# Patient Record
Sex: Male | Born: 1990 | Race: Black or African American | Hispanic: No | Marital: Single | Smoking: Never smoker
Health system: Southern US, Community
[De-identification: ages and names within clinical notes are randomized; demographics above are authoritative.]

## PROBLEM LIST (undated history)

## (undated) ENCOUNTER — Emergency Department (HOSPITAL_BASED_OUTPATIENT_CLINIC_OR_DEPARTMENT_OTHER): Admission: EM | Payer: 59 | Source: Home / Self Care

## (undated) ENCOUNTER — Ambulatory Visit (HOSPITAL_COMMUNITY): Admission: EM | Payer: 59

## (undated) DIAGNOSIS — K118 Other diseases of salivary glands: Secondary | ICD-10-CM

## (undated) DIAGNOSIS — K219 Gastro-esophageal reflux disease without esophagitis: Secondary | ICD-10-CM

## (undated) DIAGNOSIS — R0981 Nasal congestion: Secondary | ICD-10-CM

## (undated) DIAGNOSIS — F29 Unspecified psychosis not due to a substance or known physiological condition: Secondary | ICD-10-CM

## (undated) DIAGNOSIS — T7840XA Allergy, unspecified, initial encounter: Secondary | ICD-10-CM

## (undated) DIAGNOSIS — R4182 Altered mental status, unspecified: Secondary | ICD-10-CM

## (undated) HISTORY — DX: Nasal congestion: R09.81

## (undated) HISTORY — DX: Allergy, unspecified, initial encounter: T78.40XA

## (undated) HISTORY — DX: Altered mental status, unspecified: R41.82

## (undated) HISTORY — DX: Gastro-esophageal reflux disease without esophagitis: K21.9

## (undated) HISTORY — DX: Other diseases of salivary glands: K11.8

## (undated) HISTORY — PX: WISDOM TOOTH EXTRACTION: SHX21

## (undated) HISTORY — DX: Unspecified psychosis not due to a substance or known physiological condition: F29

## (undated) HISTORY — PX: HIP ARTHROSCOPY: SUR88

---
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
## 2012-09-25 ENCOUNTER — Encounter (HOSPITAL_COMMUNITY): Payer: Self-pay

## 2012-09-25 ENCOUNTER — Emergency Department (INDEPENDENT_AMBULATORY_CARE_PROVIDER_SITE_OTHER)
Admission: EM | Admit: 2012-09-25 | Discharge: 2012-09-25 | Disposition: A | Discharge status: Home / Self Care | Payer: Managed Care, Other (non HMO) | Source: Home / Self Care

## 2012-09-25 DIAGNOSIS — S01512A Laceration without foreign body of oral cavity, initial encounter: Secondary | ICD-10-CM

## 2012-09-25 DIAGNOSIS — S01502A Unspecified open wound of oral cavity, initial encounter: Secondary | ICD-10-CM

## 2012-09-25 MED ORDER — AMOXICILLIN 500 MG PO CAPS: 500.0000 mg | ORAL_CAPSULE | Freq: Three times a day (TID) | ORAL | Status: DC

## 2012-09-25 MED ORDER — HYDROCODONE-ACETAMINOPHEN 5-325 MG PO TABS: 2.0000 | ORAL_TABLET | ORAL | Status: DC | PRN

## 2012-09-25 NOTE — ED Provider Notes (Signed)
History     CSN: [id number]  Arrival date & time 09/25/12  1611   None     Chief Complaint  Patient presents with  . Facial Pain    (Consider location/radiation/quality/duration/timing/severity/associated sxs/prior treatment) HPI Comments: Complains of pain in the right lower jaw particularly in the buccal mucosa adjacent to the wisdom tooth. This began approximately 2 days ago the   History reviewed. No pertinent past medical history.  History reviewed. No pertinent past surgical history.  History reviewed. No pertinent family history.  History  Substance Use Topics  . Smoking status: Not on file  . Smokeless tobacco: Not on file  . Alcohol Use: Not on file      Review of Systems  All other systems reviewed and are negative.    Allergies  Review of patient's allergies indicates no known allergies.  Home Medications   Current Outpatient Rx  Name  Route  Sig  Dispense  Refill  . AMOXICILLIN 500 MG PO CAPS   Oral   Take 1 capsule (500 mg total) by mouth 3 (three) times daily.   21 capsule   0   . HYDROCODONE-ACETAMINOPHEN 5-325 MG PO TABS   Oral   Take 2 tablets by mouth every 4 (four) hours as needed for pain.   15 tablet   0     There were no vitals taken for this visit.  Physical Exam  Nursing note and vitals reviewed. Constitutional: He is oriented to person, place, and time. He appears well-developed and well-nourished. No distress.  HENT:  Nose: Nose normal.  Mouth/Throat: Oropharynx is clear and moist. No oropharyngeal exudate.       The right lower third molar to is a rocking through the time and there is a sharp edge of the enamel. The sharp edge has been cutting into the adjacent vehicle because of. This produced a 2-3 mm superficial laceration. There is no sign of infection, abscess, erythema or drainage. There is light bleeding.  Eyes: Conjunctivae normal are normal.  Neck: Normal range of motion. Neck supple.  Pulmonary/Chest: Effort  normal.  Lymphadenopathy:    He has cervical adenopathy.  Neurological: He is alert and oriented to person, place, and time.  Skin: Skin is warm and dry.  Psychiatric: He has a normal mood and affect.    ED Course  Procedures (including critical care time)  Labs Reviewed - No data to display No results found.   1. Laceration of buccal mucosa       MDM  Plan to place calls between the bucca mucosa and the tube. He must call a dentist to see as soon as possible. I expect he will have to have some filing down of the tooth or habit extracted. Prophylaxis of infection we will prescribed amoxicillin 500 3 times a day for 7 days Ibuprofen 600 mg every 6-8 hours when necessary pain Norco 5 mg q. 4-6 hours when necessary pain         Hayden Rasmussen, NP 09/25/12 2233

## 2012-09-25 NOTE — ED Notes (Signed)
C/o sinus issues for several years, and a sore place in his mouth for past few days

## 2012-09-26 NOTE — ED Notes (Signed)
Pt called wanting to know if he could take ibuprofen in addition to the hydrocodone.  Spoke with Alba Cory and verified that as long as the pt had no allergy to ibuprofen then that would be fine.  Pt voiced understanding.

## 2012-09-27 NOTE — ED Notes (Signed)
Please call at [phone] for any issues

## 2012-09-27 NOTE — ED Notes (Signed)
Spoke w  Dawn in Clinical affairs at [facility] school of [facility]istry, who will accept our referral for this patient  [phone]; fax [phone]. Copies of chart to date to be sent via fax

## 2012-09-28 NOTE — ED Provider Notes (Signed)
Medical screening examination/treatment/procedure(s) were performed by resident physician or non-physician practitioner and as supervising physician I was immediately available for consultation/collaboration.   Satsuki Zillmer DOUGLAS MD.    Ardyce Heyer D Rahcel Shutes, MD 09/28/12 1931 

## 2013-01-06 ENCOUNTER — Other Ambulatory Visit: Payer: Self-pay | Admitting: Otolaryngology

## 2013-01-06 DIAGNOSIS — K118 Other diseases of salivary glands: Secondary | ICD-10-CM

## 2013-01-08 ENCOUNTER — Ambulatory Visit
Admission: RE | Admit: 2013-01-08 | Discharge: 2013-01-08 | Disposition: A | Discharge status: Home / Self Care | Payer: Managed Care, Other (non HMO) | Source: Ambulatory Visit | Attending: Otolaryngology | Admitting: Otolaryngology

## 2013-01-08 DIAGNOSIS — K118 Other diseases of salivary glands: Secondary | ICD-10-CM

## 2013-01-08 MED ORDER — IOHEXOL 300 MG/ML  SOLN
75.0000 mL | Freq: Once | INTRAMUSCULAR | Status: AC | PRN
  Administered 2013-01-08: 75 mL via INTRAVENOUS

## 2013-02-05 ENCOUNTER — Ambulatory Visit (INDEPENDENT_AMBULATORY_CARE_PROVIDER_SITE_OTHER): Payer: Managed Care, Other (non HMO) | Admitting: Neurology

## 2013-02-05 ENCOUNTER — Encounter: Payer: Self-pay | Admitting: Neurology

## 2013-02-05 VITALS — BP 130/65 | HR 61 | Ht 66.0 in | Wt 127.0 lb

## 2013-02-05 DIAGNOSIS — K118 Other diseases of salivary glands: Secondary | ICD-10-CM | POA: Insufficient documentation

## 2013-02-05 DIAGNOSIS — J3489 Other specified disorders of nose and nasal sinuses: Secondary | ICD-10-CM

## 2013-02-05 DIAGNOSIS — R0981 Nasal congestion: Secondary | ICD-10-CM | POA: Insufficient documentation

## 2013-02-05 MED ORDER — GABAPENTIN 100 MG PO CAPS: 100.0000 mg | ORAL_CAPSULE | Freq: Three times a day (TID) | ORAL | Status: DC

## 2013-02-05 NOTE — Progress Notes (Signed)
Nicholas Johnson is a 22 years old right-handed African American Male, referred by his ENT doctor Nicholas Johnson for evaluation of right jaw pain  His right jaw pain started around age 42, he was playing tuba in marching band, practice couple hours each day, during the process, he noticed right jaw  sharp burning sensation, which worsened while he platy instrument, improved if he rest he actually give up playing tuba because of his right jaw discomfort,  But he still has right jaw pressure sensation despite the fact he is no longer playing instrument, he felt pressure in a patient also affecting her right throat, right nose, irritation, he denied difficulty chewing, swallowing, no TMJ joints pain, complains of right ear popping,  Over the years he was seen by different physicians, but failed to identify etiology  He was evaluated by Dr. Jenne Johnson in January 01 2013, flexible laryngoscope examination was normal, including noninvasive was pharyngeal, vocal cord, CT of the neck was normal  Review of Systems  Out of a complete 14 system review, the patient complains of only the following symptoms, and all other reviewed systems are negative.   Constitutional:   N/A Cardiovascular:  N/A Ear/Nose/Throat:  N/A Skin: N/A Eyes: N/A Respiratory: N/A Gastroitestinal: N/A    Hematology/Lymphatic:  N/A Endocrine:  N/A Musculoskeletal:N/A Allergy/Immunology: N/A Neurological: N/A Psychiatric:    N/A  PHYSICAL EXAMINATOINS:  Generalized: In no acute distress  Neck: Supple, no carotid bruits   Cardiac: Regular rate rhythm  Pulmonary: Clear to auscultation bilaterally  Musculoskeletal: No deformity  Neurological examination  Mentation: Alert oriented to time, place, history taking, and causual conversation  Cranial nerve II-XII: Pupils were equal round reactive to light extraocular movements were full, visual field were full on confrontational test. facial sensation and strength were normal. Right face looks  mildly small compared to left hearing was intact to finger rubbing bilaterally. Uvula tongue midline.  head turning and shoulder shrug and were normal and symmetric.Tongue protrusion into cheek strength was normal.  Motor: normal tone, bulk and strength.  Sensory: Intact to fine touch, pinprick, preserved vibratory sensation, and proprioception at toes.  Coordination: Normal finger to nose, heel-to-shin bilaterally there was no truncal ataxia  Gait: Rising up from seated position without assistance, normal stance, without trunk ataxia, moderate stride, good arm swing, smooth turning, able to perform tiptoe, and heel walking without difficulty.   Romberg signs: Negative  Deep tendon reflexes: Brachioradialis 2/2, biceps 2/2, triceps 2/2, patellar 2/2, Achilles 2/2, plantar responses were flexor bilaterally.  Assessment and plan:  22 years old right-handed [place] American male, with right facial discomfort, essentially normal neurological examination, ENT evaluation, no TMJ joint pain,  1 unsure etiology, I advised him to continue to observe his symptoms, 2 Gabapentin 100mg  tid. 3. Return to clinic as needed.

## 2013-02-11 ENCOUNTER — Telehealth: Payer: Self-pay

## 2013-02-11 DIAGNOSIS — R6884 Jaw pain: Secondary | ICD-10-CM

## 2013-02-11 NOTE — Telephone Encounter (Signed)
Patient calling states is there any test he can have done ? Patient states RX not helping. Please advise

## 2013-02-12 ENCOUNTER — Telehealth: Payer: Self-pay | Admitting: *Deleted

## 2013-02-12 NOTE — Telephone Encounter (Signed)
Message copied by Salome Spotted on Wed Feb 12, 2013 10:27 AM ------      Message from: Eugenie Birks      Created: Tue Feb 11, 2013  4:37 PM       [phone] pt wants Dr. Terrace Arabia to call him he has a question ------

## 2013-02-12 NOTE — Telephone Encounter (Signed)
Message copied by Harlon Flor Zyier Dykema L on Wed Feb 12, 2013 10:28 AM ------      Message from: [facility], MONICA L      Created: Wed Feb 12, 2013  8:19 AM      Contact: Pt       Pt called and wants to speak with a nurse or Dr. Terrace Arabia, he has some questions regarding the cost for testing and concerns about medicine he was given which is gabapentin. He states he called and left a message yesterday but no one returned his call..            Thanks, ------

## 2013-02-12 NOTE — Telephone Encounter (Signed)
Called patient didn't get a answer lvm asking him to call us back.

## 2013-02-12 NOTE — Telephone Encounter (Signed)
I have called him, left message, I will order MRI of TMJ,  Increase gabapentin to 100mg  3 tabs tid.

## 2013-02-12 NOTE — Telephone Encounter (Signed)
Patient calling to see how much the mri would cost him and when will it be. I told him once its approved by his insurance someone will call him with cost and appointment.

## 2013-02-13 ENCOUNTER — Telehealth: Payer: Self-pay | Admitting: *Deleted

## 2013-02-13 NOTE — Telephone Encounter (Signed)
I have called him at [phone] and [phone], left message on both, image study is ordered.(MRI of TMJ)

## 2013-02-13 NOTE — Telephone Encounter (Signed)
Patient has questions regarding his problems,please call him back at [phone].Marland Kitchen

## 2013-02-18 ENCOUNTER — Telehealth: Payer: Self-pay | Admitting: Neurology

## 2013-02-18 NOTE — Telephone Encounter (Signed)
Dr. Terrace Arabia  It looks like this patient has not even had an MRI yet.  I have called him to be more specific on what he is needing.

## 2013-02-18 NOTE — Telephone Encounter (Signed)
Message copied by Elisha Headland on Tue Feb 18, 2013 12:01 PM ------      Message from: Wynonia Lawman      Created: Tue Feb 18, 2013  8:27 AM      Contact: Pt       Pt wants Dr. Terrace Arabia to call him back regarding MRI results.  [phone]            [facility]/Diane G ------

## 2013-02-18 NOTE — Telephone Encounter (Signed)
Pt had called yesterday and LMVM to call him after 1330 re: getting MRI.  He stated we needed to call [employer] for approval [id number], also needed CPT codes.   He was not improving with medications.

## 2013-02-18 NOTE — Telephone Encounter (Signed)
Dr. Terrace Arabia not sure if MRI results are back.  Patient would like a call.

## 2013-02-19 NOTE — Telephone Encounter (Signed)
I have called and left a detailed message for the patient in regards to his MRI.  I told him to address further questions about cost and insurance to [place] who handles all the details in regards to MRIs.

## 2013-02-20 ENCOUNTER — Telehealth: Payer: Self-pay

## 2013-02-20 NOTE — Telephone Encounter (Signed)
Per Renee Call patient about MRI.

## 2013-02-24 NOTE — Telephone Encounter (Addendum)
Patient calling wants Dr.Yan to call him

## 2013-02-24 NOTE — Telephone Encounter (Signed)
I have called, his MRI right TMJ was denied by his insurance.   I have suggested him to see a dentist for further evaluation, if needed, MRI can be ordered by his dentist.

## 2013-02-26 ENCOUNTER — Telehealth: Payer: Self-pay

## 2013-02-26 ENCOUNTER — Telehealth: Payer: Self-pay | Admitting: Neurology

## 2013-02-26 NOTE — Telephone Encounter (Signed)
Called and spoke to patient he went to the Dentist 02/25/2013 and they will try and do MRI.

## 2013-02-26 NOTE — Telephone Encounter (Signed)
Pt's dentist requests a phone consultation.

## 2013-02-27 ENCOUNTER — Ambulatory Visit (INDEPENDENT_AMBULATORY_CARE_PROVIDER_SITE_OTHER): Payer: Managed Care, Other (non HMO) | Admitting: Family Medicine

## 2013-02-27 ENCOUNTER — Encounter: Payer: Self-pay | Admitting: Family Medicine

## 2013-02-27 VITALS — BP 100/58 | HR 64 | Temp 99.0°F | Resp 14 | Wt 123.0 lb

## 2013-02-27 DIAGNOSIS — H6981 Other specified disorders of Eustachian tube, right ear: Secondary | ICD-10-CM

## 2013-02-27 DIAGNOSIS — H6980 Other specified disorders of Eustachian tube, unspecified ear: Secondary | ICD-10-CM

## 2013-02-27 DIAGNOSIS — K118 Other diseases of salivary glands: Secondary | ICD-10-CM

## 2013-02-27 MED ORDER — FLUTICASONE PROPIONATE 50 MCG/ACT NA SUSP: 2.0000 | Freq: Every day | NASAL | Status: DC

## 2013-02-27 MED ORDER — CETIRIZINE HCL 10 MG PO TABS: 10.0000 mg | ORAL_TABLET | Freq: Every day | ORAL | Status: DC

## 2013-02-27 NOTE — Progress Notes (Signed)
Subjective:    Patient ID: Nicholas Overall., male    DOB: [birth year], 21 y.o.   MRN: [id number]  HPI Patient presents with a one-year history of pressure-like sensation at the angle of his right mandible near the parotid gland. He also complains of a "obstruction" between his ear and his nose. He states he hears a cracking or popping sensation in his ear. He has some mild hearing loss in that ear.  Pain began a couple years ago. 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 It was a sharp burning pain which passed quickly but he continues to have an uncomfortable pressure-like sensation that area.  I reviewed the records from his ENT doctor, as well as a CT scan of his neck, and an office visit with his dentist. 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  The CT scan was clear of parotid gland mass, or salivary gland obstructions.  The sinuses are also clear .  He has also seen a neurologist who tried empiric therapy for trigeminal neuralgia with gabapentin which did not help.   Past Medical History  Diagnosis Date  . Parotid gland pain   . Nasal congestion    No current outpatient prescriptions on file prior to visit.   No current facility-administered medications on file prior to visit.   No Known Allergies History   Social History  . Marital Status: Married    Spouse Name: N/A    Number of Children: N/A  . Years of Education: N/A   Occupational History  . Not on file.   Social History Main Topics  . Smoking status: Never Smoker   . Smokeless tobacco: Not on file  . Alcohol Use: Not on file  . Drug Use: Not on file  . Sexually Active: Not on file   Other Topics Concern  . Not on file   Social History Narrative  . No narrative on file      Review of Systems  All other systems reviewed and are negative.       Objective:   Physical Exam  Constitutional: He is oriented to person, place, and time. He appears well-developed and well-nourished.   HENT:  Head: Normocephalic.  Right Ear: Tympanic membrane, external ear and ear canal normal. Tympanic membrane is not injected, not scarred, not perforated, not erythematous, not retracted and not bulging. No middle ear effusion.  Left Ear: Tympanic membrane, external ear and ear canal normal. Tympanic membrane is not injected, not scarred, not perforated, not erythematous, not retracted and not bulging.  Nose: Mucosal edema and rhinorrhea present. No septal deviation or nasal septal hematoma. Right sinus exhibits no maxillary sinus tenderness and no frontal sinus tenderness. Left sinus exhibits no maxillary sinus tenderness and no frontal sinus tenderness.  Mouth/Throat: Oropharynx is clear and moist.  Eyes: Conjunctivae and EOM are normal. Pupils are equal, round, and reactive to light.  Neck: Normal range of motion. Neck supple. No JVD present. No tracheal deviation present. No thyromegaly present.  Cardiovascular: Normal rate, regular rhythm and normal heart sounds.   No murmur heard. Pulmonary/Chest: Effort normal and breath sounds normal. No stridor. No respiratory distress. He has no wheezes. He has no rales. He exhibits no tenderness.  Lymphadenopathy:    He has no cervical adenopathy.  Neurological: He is alert and oriented to person, place, and time. No cranial nerve deficit. Coordination normal.  Skin: Skin is warm.  Psychiatric: He has a normal  mood and affect. His behavior is normal. Judgment and thought content normal.          Assessment & Plan:  Parotid gland pain  Chronic eustachian tube dysfunction, right  The patient has a very interesting history. The obstruction sensation he describes sounds a bit like eustachian tube dysfunction. I'm going to try the patient on Flonase 2 sprays each nostril inhaled daily and Zyrtec 10 mg by mouth daily. We will see if this alleviates some of eustachian tube dysfunction and clears up this obstruction or pressure-like sensation in  his right sinuses and nasal cavity. The pressure-like sensation that he feels near the angle of his right mandible could possibly be trigeminal neuralgia, parotid gland pain, postnasal drip. I will see if empiric treatment improves this as well.  He is to follow with me in one month.

## 2013-03-03 NOTE — Telephone Encounter (Signed)
Dr. Terrace Arabia Please call Dr. Dolores Hoose office today. She wants to speak to you about patient.

## 2013-03-03 NOTE — Telephone Encounter (Signed)
Annabelle Harman, please connect me to Dr. Levy Pupa office and page me.

## 2013-03-04 NOTE — Telephone Encounter (Signed)
Called Dr.Semone's office and she is with patient. Spoke to Cockrell Hill and she will have Dr.Semone to call back and ask for Dr.Yan to be paged.

## 2013-03-07 ENCOUNTER — Telehealth: Payer: Self-pay | Admitting: *Deleted

## 2013-03-07 NOTE — Telephone Encounter (Signed)
I have only seen the patient once. He is welcome to a copy of his office visit which he can show to his employer. At the present time I do not know the length of treatment.

## 2013-03-10 NOTE — Telephone Encounter (Signed)
Pt came in today wanted to know about his work note I told him the above message that we only seen him once and that he can have his ov notes and show to his employer he accepted that and said we will see what they say..printed out his office visit note.

## 2013-05-13 ENCOUNTER — Ambulatory Visit (INDEPENDENT_AMBULATORY_CARE_PROVIDER_SITE_OTHER): Payer: Managed Care, Other (non HMO) | Admitting: Family Medicine

## 2013-05-13 ENCOUNTER — Encounter: Payer: Self-pay | Admitting: Family Medicine

## 2013-05-13 VITALS — BP 120/60 | HR 72 | Temp 98.5°F | Resp 12 | Ht 67.0 in | Wt 120.0 lb

## 2013-05-13 DIAGNOSIS — H6980 Other specified disorders of Eustachian tube, unspecified ear: Secondary | ICD-10-CM

## 2013-05-13 DIAGNOSIS — H6981 Other specified disorders of Eustachian tube, right ear: Secondary | ICD-10-CM

## 2013-05-13 NOTE — Progress Notes (Signed)
Subjective:    Patient ID: Nicholas Overall., male    DOB: [birth year], 22 y.o.   MRN: [id number]  HPI 02/27/13 Patient presents with a one-year history of pressure-like sensation at the angle of his right mandible near the parotid gland. He also complains of a "obstruction" between his ear and his nose. He states he hears a cracking or popping sensation in his ear. He has some mild hearing loss in that ear.  Pain began a couple years ago. 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 It was a sharp burning pain which passed quickly but he continues to have an uncomfortable pressure-like sensation that area.  I reviewed the records from his ENT doctor, as well as a CT scan of his neck, and an office visit with his dentist. 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  The CT scan was clear of parotid gland mass, or salivary gland obstructions.  The sinuses are also clear .  He has also seen a neurologist who tried empiric therapy for trigeminal neuralgia with gabapentin which did not help.  At that time, my plan was: The patient has a very interesting history. The obstruction sensation he describes sounds a bit like eustachian tube dysfunction. I'm going to try the patient on Flonase 2 sprays each nostril inhaled daily and Zyrtec 10 mg by mouth daily. We will see if this alleviates some of eustachian tube dysfunction and clears up this obstruction or pressure-like sensation in his right sinuses and nasal cavity. The pressure-like sensation that he feels near the angle of his right mandible could possibly be trigeminal neuralgia, parotid gland pain, postnasal drip. I will see if empiric treatment improves this as well.  He is to follow with me in one month.  05/13/13 Patient is here for recheck. He saw no benefit from Flonase to Zyrtec. He no longer complains of pain in or around his parotid gland.  However he continues to complain of a pressure behind his right eardrum. He complains of  popping and cracking in his right ear whenever he coughs or sneezes. He complains of decreased hearing in his right ear. He denies any pain in his right ear. He denies any rhinorrhea or congestion. He denies any sneezing or sinus pain. Past Medical History  Diagnosis Date  . Parotid gland pain   . Nasal congestion    No current outpatient prescriptions on file prior to visit.   No current facility-administered medications on file prior to visit.   No Known Allergies History   Social History  . Marital Status: Married    Spouse Name: N/A    Number of Children: N/A  . Years of Education: N/A   Occupational History  . Not on file.   Social History Main Topics  . Smoking status: Never Smoker   . Smokeless tobacco: Not on file  . Alcohol Use: Not on file  . Drug Use: Not on file  . Sexually Active: Not on file   Other Topics Concern  . Not on file   Social History Narrative  . No narrative on file      Review of Systems  All other systems reviewed and are negative.       Objective:   Physical Exam  Constitutional: He is oriented to person, place, and time. He appears well-developed and well-nourished.  HENT:  Head: Normocephalic.  Right Ear: Tympanic membrane, external ear and ear canal normal. Tympanic membrane is  not injected, not scarred, not perforated, not erythematous, not retracted and not bulging. No middle ear effusion.  Left Ear: Tympanic membrane, external ear and ear canal normal. Tympanic membrane is not injected, not scarred, not perforated, not erythematous, not retracted and not bulging.  Nose: Mucosal edema and rhinorrhea present. No septal deviation or nasal septal hematoma. Right sinus exhibits no maxillary sinus tenderness and no frontal sinus tenderness. Left sinus exhibits no maxillary sinus tenderness and no frontal sinus tenderness.  Mouth/Throat: Oropharynx is clear and moist.  Eyes: Conjunctivae and EOM are normal. Pupils are equal, round, and  reactive to light.  Neck: Normal range of motion. Neck supple. No JVD present. No tracheal deviation present. No thyromegaly present.  Cardiovascular: Normal rate, regular rhythm and normal heart sounds.   No murmur heard. Pulmonary/Chest: Effort normal and breath sounds normal. No stridor. No respiratory distress. He has no wheezes. He has no rales. He exhibits no tenderness.  Lymphadenopathy:    He has no cervical adenopathy.  Neurological: He is alert and oriented to person, place, and time. No cranial nerve deficit. Coordination normal.  Skin: Skin is warm.  Psychiatric: He has a normal mood and affect. His behavior is normal. Judgment and thought content normal.          Assessment & Plan:  1. Eustachian tube dysfunction, right Patient would like to see a different ENT doctor for a second opinion.  I believe his symptoms are consistent with eustachian tube dysfunction and the patient may benefit from pressure equalization tubes. I would recommend tympanometry and possibly a CAT scan of the sinuses but I would defer this to ENT. I will consult Dr. Suszanne Conners for second opinion. - Ambulatory referral to ENT

## 2013-05-23 ENCOUNTER — Telehealth: Payer: Self-pay | Admitting: Family Medicine

## 2013-05-23 NOTE — Telephone Encounter (Signed)
I'd wait to see Teoh.

## 2013-05-26 NOTE — Telephone Encounter (Signed)
Pt aware.

## 2013-06-12 ENCOUNTER — Ambulatory Visit (INDEPENDENT_AMBULATORY_CARE_PROVIDER_SITE_OTHER): Payer: Managed Care, Other (non HMO) | Admitting: Otolaryngology

## 2013-06-12 DIAGNOSIS — H6980 Other specified disorders of Eustachian tube, unspecified ear: Secondary | ICD-10-CM

## 2013-06-20 ENCOUNTER — Ambulatory Visit: Payer: Managed Care, Other (non HMO) | Admitting: Family Medicine

## 2013-06-26 ENCOUNTER — Encounter: Payer: Self-pay | Admitting: Family Medicine

## 2013-06-26 ENCOUNTER — Ambulatory Visit (INDEPENDENT_AMBULATORY_CARE_PROVIDER_SITE_OTHER): Payer: Managed Care, Other (non HMO) | Admitting: Family Medicine

## 2013-06-26 VITALS — BP 100/60 | HR 68 | Temp 98.5°F | Resp 14 | Wt 120.0 lb

## 2013-06-26 DIAGNOSIS — F458 Other somatoform disorders: Secondary | ICD-10-CM

## 2013-06-26 DIAGNOSIS — R0989 Other specified symptoms and signs involving the circulatory and respiratory systems: Secondary | ICD-10-CM

## 2013-06-26 DIAGNOSIS — R198 Other specified symptoms and signs involving the digestive system and abdomen: Secondary | ICD-10-CM

## 2013-06-26 NOTE — Progress Notes (Signed)
Subjective:    Patient ID: Nicholas Johnson., male    DOB: [birth year], 22 y.o.   MRN: [id number]  HPI 02/27/13 Patient presents with a one-year history of pressure-like sensation at the angle of his right mandible near the parotid gland. He also complains of a "obstruction" between his ear and his nose. He states he hears a cracking or popping sensation in his ear. He has some mild hearing loss in that ear.  Pain began a couple years ago. 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 It was a sharp burning pain which passed quickly but he continues to have an uncomfortable pressure-like sensation that area.  I reviewed the records from his ENT doctor, as well as a CT scan of his neck, and an office visit with his dentist. 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  The CT scan was clear of parotid gland mass, or salivary gland obstructions.  The sinuses are also clear .  He has also seen a neurologist who tried empiric therapy for trigeminal neuralgia with gabapentin which did not help.  At that time, my plan was: The patient has a very interesting history. The obstruction sensation he describes sounds a bit like eustachian tube dysfunction. I'm going to try the patient on Flonase 2 sprays each nostril inhaled daily and Zyrtec 10 mg by mouth daily. We will see if this alleviates some of eustachian tube dysfunction and clears up this obstruction or pressure-like sensation in his right sinuses and nasal cavity. The pressure-like sensation that he feels near the angle of his right mandible could possibly be trigeminal neuralgia, parotid gland pain, postnasal drip. I will see if empiric treatment improves this as well.  He is to follow with me in one month.  05/13/13 Patient is here for recheck. He saw no benefit from Flonase to Zyrtec. He no longer complains of pain in or around his parotid gland.  However he continues to complain of a pressure behind his right eardrum. He complains of  popping and cracking in his right ear whenever he coughs or sneezes. He complains of decreased hearing in his right ear. He denies any pain in his right ear. He denies any rhinorrhea or congestion. He denies any sneezing or sinus pain. At that time, my plan was: 1. Eustachian tube dysfunction, right Patient would like to see a different ENT doctor for a second opinion.  I believe his symptoms are consistent with eustachian tube dysfunction and the patient may benefit from pressure equalization tubes. I would recommend tympanometry and possibly a CAT scan of the sinuses but I would defer this to ENT. I will consult Dr. Suszanne Conners for second opinion. - Ambulatory referral to ENT  06/26/13 Patient saw Dr. Suszanne Conners.  He recommended a prednisone Dosepak and Valsalva maneuvers for eustachian tube dysfunction. Unfortunately the patient has not experienced any improvement. Furthermore he continues to complain of a pressure like sensation in the back of his throat at the level of the angle of the mandible. It is deep to the right side of his throat. He had a CT scan of the head and neck which was negative. He's also had a laryngoscopy performed by ENT which was negative. The symptoms have been present for over 2 years with no significant sequela. Unfortunately they do not seem to be improving. Past Medical History  Diagnosis Date  . Parotid gland pain   . Nasal congestion    No current outpatient  prescriptions on file prior to visit.   No current facility-administered medications on file prior to visit.   No Known Allergies History   Social History  . Marital Status: Married    Spouse Name: N/A    Number of Children: N/A  . Years of Education: N/A   Occupational History  . Not on file.   Social History Main Topics  . Smoking status: Never Smoker   . Smokeless tobacco: Not on file  . Alcohol Use: Not on file  . Drug Use: Not on file  . Sexual Activity: Not on file   Other Topics Concern  . Not on file    Social History Narrative  . No narrative on file      Review of Systems  All other systems reviewed and are negative.       Objective:   Physical Exam  Constitutional: He is oriented to person, place, and time. He appears well-developed and well-nourished.  HENT:  Head: Normocephalic.  Right Ear: Tympanic membrane, external ear and ear canal normal. Tympanic membrane is not injected, not scarred, not perforated, not erythematous, not retracted and not bulging. No middle ear effusion.  Left Ear: Tympanic membrane, external ear and ear canal normal. Tympanic membrane is not injected, not scarred, not perforated, not erythematous, not retracted and not bulging.  Nose: Mucosal edema and rhinorrhea present. No septal deviation or nasal septal hematoma. Right sinus exhibits no maxillary sinus tenderness and no frontal sinus tenderness. Left sinus exhibits no maxillary sinus tenderness and no frontal sinus tenderness.  Mouth/Throat: Oropharynx is clear and moist.  Eyes: Conjunctivae and EOM are normal. Pupils are equal, round, and reactive to light.  Neck: Normal range of motion. Neck supple. No JVD present. No tracheal deviation present. No thyromegaly present.  Cardiovascular: Normal rate, regular rhythm and normal heart sounds.   No murmur heard. Pulmonary/Chest: Effort normal and breath sounds normal. No stridor. No respiratory distress. He has no wheezes. He has no rales. He exhibits no tenderness.  Lymphadenopathy:    He has no cervical adenopathy.  Neurological: He is alert and oriented to person, place, and time. No cranial nerve deficit. Coordination normal.  Skin: Skin is warm.  Psychiatric: He has a normal mood and affect. His behavior is normal. Judgment and thought content normal.          Assessment & Plan:   1. Globus sensation I recommended the patient followup with Dr. Suszanne Conners as planned to discuss further intervention for his eustachian tube dysfunction.  Unfortunately I do not know what else to do or to try to treat the globus sensation in his throat. 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 I do not feel that it is related to reflux or psychiatric in nature. Therefore I will not charge the patient a copay today. I apologized to the patient but I do not know anything else that can be tried. I do believe the problem is benign and not life-threatening.

## 2013-07-10 ENCOUNTER — Ambulatory Visit (INDEPENDENT_AMBULATORY_CARE_PROVIDER_SITE_OTHER): Payer: Managed Care, Other (non HMO) | Admitting: Otolaryngology

## 2013-07-10 DIAGNOSIS — R070 Pain in throat: Secondary | ICD-10-CM

## 2013-07-10 DIAGNOSIS — H6980 Other specified disorders of Eustachian tube, unspecified ear: Secondary | ICD-10-CM

## 2013-07-10 DIAGNOSIS — J310 Chronic rhinitis: Secondary | ICD-10-CM

## 2014-10-18 ENCOUNTER — Emergency Department (HOSPITAL_COMMUNITY)
Admission: EM | Admit: 2014-10-18 | Discharge: 2014-10-18 | Disposition: A | Discharge status: Home / Self Care | Payer: Managed Care, Other (non HMO) | Attending: Emergency Medicine | Admitting: Emergency Medicine

## 2014-10-18 ENCOUNTER — Encounter (HOSPITAL_COMMUNITY): Payer: Self-pay | Admitting: Emergency Medicine

## 2014-10-18 DIAGNOSIS — Z8719 Personal history of other diseases of the digestive system: Secondary | ICD-10-CM | POA: Insufficient documentation

## 2014-10-18 DIAGNOSIS — F99 Mental disorder, not otherwise specified: Secondary | ICD-10-CM | POA: Insufficient documentation

## 2014-10-18 DIAGNOSIS — Z008 Encounter for other general examination: Secondary | ICD-10-CM | POA: Diagnosis present

## 2014-10-18 NOTE — ED Notes (Signed)
Pt reports to ED with family, reporting that he has had increased anxiety "after a personal situation" a week ago, that has had him "confused" so that he will write down details of events and stay in his room due feeling that he will "forget something or not be right with the Lord." Pt noted to have religious preoccupation, poor eye contact and inappropriately smiling. Pt holding a piece of paper and phone where he is currently keeping his notes.

## 2014-10-18 NOTE — ED Provider Notes (Signed)
CSN: [id number]     Arrival date & time 10/18/14  1911 History   First MD Initiated Contact with Patient 10/18/14 1945     Chief Complaint  Patient presents with  . Medical Clearance     (Consider location/radiation/quality/duration/timing/severity/associated sxs/prior Treatment) HPI Nicholas Overalleter J Lauture Jr. is a 23 y.o. male with no medical problems presents to emergency department with his family who were concerned about patient's mental health. Patient states that he is fine, he states that "I had a situation that happened and I have been just dealing with the situation." Patient's family stated the patient locked himself in the room for over 24 hours, he refused to get out of the room, he has been scribbling different things on multiple pieces of paper, he has been very religious preoccupied, sometimes forgetting things, sometimes "talking out of his head." Patient's family is very concerned about his mental status. Patient denies. He denies any confusion, no memory issues, no hallucinations, no delusions. He states "I'm just dealing with some spiritual things right now something that we will let her stand." Patient sitting in the ER, scribbling on a piece of paper, unable to tell what he is scribbling. Patient does not have history of the same. He states that he is just going through a lot. He denies SI or HI. No history of depression. He states he is seeing "spiritual counselor" who is helping him deal with his issues. He refuses to tell us his name. He also refuses to go in detail about his "situation."  Past Medical History  Diagnosis Date  . Parotid gland pain   . Nasal congestion    History reviewed. No pertinent past surgical history. History reviewed. No pertinent family history. History  Substance Use Topics  . Smoking status: Never Smoker   . Smokeless tobacco: Never Used  . Alcohol Use: No    Review of Systems  Constitutional: Negative for fever and chills.  Respiratory:  Negative for cough, chest tightness and shortness of breath.   Cardiovascular: Negative for chest pain, palpitations and leg swelling.  Gastrointestinal: Negative for nausea, vomiting, abdominal pain, diarrhea and abdominal distention.  Genitourinary: Negative for dysuria, urgency, frequency and hematuria.  Musculoskeletal: Negative for myalgias, arthralgias, neck pain and neck stiffness.  Skin: Negative for rash.  Allergic/Immunologic: Negative for immunocompromised state.  Neurological: Negative for dizziness, weakness, light-headedness, numbness and headaches.  Psychiatric/Behavioral: Positive for confusion and decreased concentration. The patient is nervous/anxious.   All other systems reviewed and are negative.     Allergies  Review of patient's allergies indicates no known allergies.  Home Medications   Prior to Admission medications   Not on File   BP 118/75 mmHg  Pulse 63  Temp(Src) 97.6 F (36.4 C) (Oral)  Resp 16  Ht 5\' 7"  (1.702 m)  Wt 125 lb (56.7 kg)  BMI 19.57 kg/m2  SpO2 100% Physical Exam  Constitutional: He is oriented to person, place, and time. He appears well-developed and well-nourished. No distress.  HENT:  Head: Normocephalic and atraumatic.  Eyes: Conjunctivae and EOM are normal. Pupils are equal, round, and reactive to light.  Neck: Normal range of motion. Neck supple.  No meningismus  Cardiovascular: Normal rate, regular rhythm and normal heart sounds.   Pulmonary/Chest: Effort normal and breath sounds normal. No respiratory distress.  Musculoskeletal: He exhibits no edema.  Neurological: He is alert and oriented to person, place, and time. No cranial nerve deficit. He exhibits normal muscle tone. Coordination normal.  5/5 and  equal upper and lower extremity strength bilaterally. Equal grip strength bilaterally. Normal finger to nose and heel to shin. No pronator drift.   Skin: Skin is warm and dry. No rash noted.  Psychiatric: He has a normal  mood and affect. His speech is rapid and/or pressured. He is hyperactive.  Nursing note and vitals reviewed.   ED Course  Procedures (including critical care time) Labs Review Labs Reviewed - No data to display  Imaging Review No results found.   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 There is a concern for possible early psychosis and schizophrenia. Patient apparently liking himself in the room, refusing to communicate, scribbling on multiple pieces of paper, very preoccupied with religion and God. On my examination, although hyperactive, pressured speech, smiling inappropriately, he is alert and oriented 3. No SI or HI. Patient is well aware of situation that's happening right now, and he is able to reason at this time about future treatment plan. No indication of possible self-harm or harm to anyone else. I did consult TMs, who assessed the patient. They discussed patient with psychiatrist who recommended observation, however patient is refusing to stay. This time no indication for IVCing him. Patient was given resources for outpatient follow-up. He'll be discharged home with his family who agreed to bring him back if his condition worsens. Patient also understands that he needs to seek help, and return if he is having any issues.  Filed Vitals:   10/18/14 1930  BP: 118/75  Pulse: 63  Temp: 97.6 F (36.4 C)  TempSrc: Oral  Resp: 16  Height: 5\' 7"  (1.702 m)  Weight: 125 lb (56.7 kg)  SpO2: 100%        Lottie Musselatyana A Tamia Dial, PA-C 10/18/14 2300  Raeford RazorStephen Kohut, MD 10/19/14 1455

## 2014-10-18 NOTE — Discharge Instructions (Signed)
Please follow up with resources provided. Return if worsening symptoms.   Schizophrenia Schizophrenia is a mental illness. It may cause disturbed or disorganized thinking, speech, or behavior. People with schizophrenia have problems functioning in one or more areas of life: work, school, home, or relationships. People with schizophrenia are at increased risk for suicide, certain chronic physical illnesses, and unhealthy behaviors, such as smoking and drug use. People who have family members with schizophrenia are at higher risk of developing the illness. Schizophrenia affects men and women equally but usually appears at an earlier age (teenage or early adult years) in men.  SYMPTOMS The earliest symptoms are often subtle (prodrome) and may go unnoticed until the illness becomes more severe (first-break psychosis). Symptoms of schizophrenia may be continuous or may come and go in severity. Episodes often are triggered by major life events, such as family stress, college, [employer] service, marriage, pregnancy or child birth, divorce, or loss of a loved one. People with schizophrenia may see, hear, or feel things that do not exist (hallucinations). They may have false beliefs in spite of obvious proof to the contrary (delusions). Sometimes speech is incoherent or behavior is odd or withdrawn.  DIAGNOSIS Schizophrenia is diagnosed through an assessment by your caregiver. Your caregiver will ask questions about your thoughts, behavior, mood, and ability to function in daily life. Your caregiver may ask questions about your medical history and use of alcohol or drugs, including prescription medication. Your caregiver may also order blood tests and imaging exams. Certain medical conditions and substances can cause symptoms that resemble schizophrenia. Your caregiver may refer you to a mental health specialist for evaluation. There are three major criterion for a diagnosis of schizophrenia:  Two or more of the  following five symptoms are present for a month or longer:  Delusions. Often the delusions are that you are being attacked, harassed, cheated, persecuted or conspired against (persecutory delusions).  Hallucinations.   Disorganized speech that does not make sense to others.  Grossly disorganized (confused or unfocused) behavior or extremely overactive or underactive motor activity (catatonia).  Negative symptoms such as bland or blunted emotions (flat affect), loss of will power (avolition), and withdrawal from social contacts (social isolation).  Level of functioning in one or more major areas of life (work, school, relationships, or self-care) is markedly below the level of functioning before the onset of illness.   There are continuous signs of illness (either mild symptoms or decreased level of functioning) for at least 6 months or longer. TREATMENT  Schizophrenia is a long-term illness. It is best controlled with continuous treatment rather than treatment only when symptoms occur. The following treatments are used to manage schizophrenia:  Medication--Medication is the most effective and important form of treatment for schizophrenia. Antipsychotic medications are usually prescribed to help manage schizophrenia. Other types of medication may be added to relieve any symptoms that may occur despite the use of antipsychotic medications.  Counseling or talk therapy--Individual, group, or family counseling may be helpful in providing education, support, and guidance. Many people with schizophrenia also benefit from social skills and job skills (vocational) training. A combination of medication and counseling is best for managing the disorder over time. A procedure in which electricity is applied to the brain through the scalp (electroconvulsive therapy) may be used to treat catatonic schizophrenia or schizophrenia in people who cannot take or do not respond to medication and  counseling. Document Released: 10/06/2000 Document Revised: 06/11/2013 Document Reviewed: 01/01/2013 [facility] Patient Information 2015 [employer], [place].  This information is not intended to replace advice given to you by your health care provider. Make sure you discuss any questions you have with your health care provider.  Paranoia Paranoia is a distrust of others that is not based on a real reason for distrust. This may reach delusional levels. This means the paranoid person feels the world is against them when there is no reason to make them feel that way. People with paranoia feel as though people around them are "out to get them".  SIMILAR MENTAL ILNESSES  Depression is a feeling as though you are down all the time. It is normal in some situations where you have just lost a loved one. It is abnormal if you are having feelings of paranoia with it.  Dementia is a physical problem with the brain in which the brain no longer works properly. There are problems with daily activities of living. Alzheimer's disease is one example of this. Dementia is also caused by old age changes in the brain which come with the death of brain cells and small strokes.  Paranoidschizophrenia. People with paranoid schizophrenia and persecutory delusional disorder have delusions in which they feel people around them are plotting against them. Persecutory delusions in paranoid schizophrenia are bizarre, sometimes grandiose, and often accompanied by auditory hallucinations. This means the person is hearing voices that are not there.  Delusionaldisorder (persecutory type). Delusions experienced by individuals with delusional disorder are more believable than those experienced by paranoid schizophrenics; they are not bizarre, though still unjustified. Individuals with delusional disorder may seem offbeat or quirky rather than mentally ill, and therefore, may never seek treatment. All of these problems usually do not allow these  people to interact socially in an acceptable manner. CAUSES The cause of paranoia is often not known. It is common in people with extended abuse of:  Cocaine.  Amphetamine.  Marijuana.  Alcohol. Sometimes there is an inherited tendency. It may be associated with stress or changes in brain chemistry. DIAGNOSIS  When paranoia is present, your caregiver may:  Refer you to a specialist.  Do a physical exam.  Perform other tests on you to make sure there are not other problems causing the paranoia including:  Physical problems.  Mental problems.  Chemical problems (other than drugs). Testing may be done to determine if there is a psychiatric disability present that can be treated with medicine. TREATMENT   Paranoia that is a symptom of a psychiatric problem should be treated by professionals.  Medicines are available which can help this disorder. Antipsychotic medicine may be prescribed by your caregiver.  Sometimes psychotherapy may be useful.  Conditions such as depression or drug abuse are treated individually. If the paranoia is caused by drug abuse, a treatment facility may be helpful. Depression may be helped by antidepressants. PROGNOSIS   Paranoid people are difficult to treat because of their belief that everyone is out to get them or harm them. Because of this mistrust, they often must be talked into entering treatment by a trusted family member or friend. They may not want to take medicine as they may see this as an attempt to poison them.  Gradual gains in the trust of a therapist or caregiver helps in a successful treatment plan.  Some people with PPD or persecutory delusional disorder function in society without treatment in limited fashion. Document Released: 10/12/2003 Document Revised: 01/01/2012 Document Reviewed: 06/16/2008 [facility] Patient Information 2015 [place], [place]. This information is not intended to replace advice given to  you by your health care  provider. Make sure you discuss any questions you have with your health care provider.

## 2014-10-18 NOTE — BH Assessment (Signed)
Tele Assessment Note   Nicholas Johnson. is a 23 y.o. male who voluntarily presents to [facility] with increased anxiety, accompanied by his mother and aunt.  Pt denies SI/HI/AVH.  Pt brought in by his family, they are concerned with his recent  Behavior: per mother/aunt, pt has been his room for the last 48 hrs and would come out and and has not eaten. Pt.'s family says that he repeatedly states he needs to do something or say something "to make it right". Pt told this Probation officer that he has been praying for the last 24-48 hrs about a spiritual matter in regards to relationships with women.  Mother and aunt state that pt is a spiritual person--attending church, reading the bible and prayer/meditation but for the last 2 days his routine has become excessive and he's been writing notes so he "doesn't forget things". 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 This Probation officer spoke with Dr. Parke Poisson, although no criteria is met for inpt admission, Dr. Parke Poisson suggested that pt remain in the ED overnight for observation and see the psychiatrist in the morning.  Pt declined service and will d/c'd home with mother/aunt with referrals.  Axis I: Mood Disorder NOS Axis II: Deferred Axis III:  Past Medical History  Diagnosis Date  . Parotid gland pain   . Nasal congestion    Axis IV: other psychosocial or environmental problems and problems related to social environment Axis V: 51-60 moderate symptoms  Past Medical History:  Past Medical History  Diagnosis Date  . Parotid gland pain   . Nasal congestion     History reviewed. No pertinent past surgical history.  Family History: History reviewed. No pertinent family history.  Social History:  reports that he has never smoked. He has never used smokeless tobacco. He reports that he does not drink alcohol or use illicit drugs.  Additional Social History:  Alcohol / Drug Use Pain Medications: None  Prescriptions: None  Over the  Counter: None  History of alcohol / drug use?: No history of alcohol / drug abuse Longest period of sobriety (when/how long): Denies  CIWA: CIWA-Ar BP: 118/75 mmHg Pulse Rate: 63 COWS:    PATIENT STRENGTHS: (choose at least two) Communication skills Supportive family/friends  Allergies: No Known Allergies  Home Medications:  (Not in a hospital admission)  OB/GYN Status:  No LMP for male patient.  General Assessment Data Location of Assessment: [facility] ED Is this a Tele or Face-to-Face Assessment?: Tele Assessment Is this an Initial Assessment or a Re-assessment for this encounter?: Initial Assessment Living Arrangements: Alone (Lives in an apartment ) Can pt return to current living arrangement?: Yes Admission Status: Voluntary Is patient capable of signing voluntary admission?: Yes Transfer from: Home Referral Source: Self/Family/Friend  Medical Screening Exam ([place]) Medical Exam completed: No Reason for MSE not completed: Other: (None )  [place] Living Arrangements: Alone (Lives in an apartment ) Name of Psychiatrist: None  Name of Therapist: None   Education Status Is patient currently in school?: Yes Current Grade: Tree surgeon  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  Name of school: [employer] account planner person: None  Risk to self with the past 6 months Suicidal Ideation: No Suicidal Intent: No Is patient at risk for suicide?: No Suicidal Plan?: No Access to Means: No What has been your use of drugs/alcohol within the last 12 months?: Denies  Previous Attempts/Gestures: No  How many times?: 0 Other Self Harm Risks: None  Triggers for Past Attempts: None known Intentional Self Injurious Behavior: None Family Suicide History: No Recent stressful life event(s): Other (Comment), Conflict (Comment) (Relationship Issues ) Persecutory voices/beliefs?: No Depression: No Depression Symptoms:  (None reported  ) Substance abuse history and/or treatment for substance abuse?: No Suicide prevention information given to non-admitted patients: Not applicable  Risk to Others within the past 6 months Homicidal Ideation: No Thoughts of Harm to Others: No Current Homicidal Intent: No Current Homicidal Plan: No Access to Homicidal Means: No Identified Victim: None  History of harm to others?: No Assessment of Violence: None Noted Violent Behavior Description: None  Does patient have access to weapons?: No Criminal Charges Pending?: No Does patient have a court date: No  Psychosis Hallucinations: None noted Delusions: None noted  Mental Status Report Appear/Hygiene: Other (Comment) (Appropriate ) Eye Contact: Good Motor Activity: Unremarkable Speech: Logical/coherent, Pressured Level of Consciousness: Alert Mood: Anxious, Preoccupied Affect: Anxious, Preoccupied Anxiety Level: Moderate Thought Processes: Coherent, Relevant Judgement: Unimpaired Orientation: Person, Place, Time, Situation Obsessive Compulsive Thoughts/Behaviors: None  Cognitive Functioning Concentration: Decreased Memory: Recent Intact, Remote Intact IQ: Average Insight: Fair Impulse Control: Good Appetite: Fair Weight Loss: 0 Weight Gain: 0 Sleep: Decreased Total Hours of Sleep: 4 Vegetative Symptoms: None  ADLScreening [facility] Assessment Services) Patient's cognitive ability adequate to safely complete daily activities?: Yes Patient able to express need for assistance with ADLs?: Yes Independently performs ADLs?: Yes (appropriate for developmental age)  Prior Inpatient Therapy Prior Inpatient Therapy: No Prior Therapy Dates: None  Prior Therapy Facilty/Provider(s): None  Reason for Treatment: None   Prior Outpatient Therapy Prior Outpatient Therapy: No Prior Therapy Dates: None  Prior Therapy Facilty/Provider(s): None  Reason for Treatment: None   ADL Screening (condition at time of  admission) Patient's cognitive ability adequate to safely complete daily activities?: Yes Is the patient deaf or have difficulty hearing?: No Does the patient have difficulty seeing, even when wearing glasses/contacts?: No Does the patient have difficulty concentrating, remembering, or making decisions?: Yes Patient able to express need for assistance with ADLs?: Yes Does the patient have difficulty dressing or bathing?: No Independently performs ADLs?: Yes (appropriate for developmental age) Does the patient have difficulty walking or climbing stairs?: No Weakness of Legs: None Weakness of Arms/Hands: None  Home Assistive Devices/Equipment Home Assistive Devices/Equipment: None  Therapy Consults (therapy consults require a physician order) PT Evaluation Needed: No OT Evalulation Needed: No SLP Evaluation Needed: No Abuse/Neglect Assessment (Assessment to be complete while patient is alone) Physical Abuse: Denies Verbal Abuse: Denies Sexual Abuse: Denies Exploitation of patient/patient's resources: Denies Self-Neglect: Denies Values / Beliefs Cultural Requests During Hospitalization: None Spiritual Requests During Hospitalization: None Consults Spiritual Care Consult Needed: No Social Work Consult Needed: No Regulatory affairs officer (For Healthcare) Does patient have an advance directive?: No Would patient like information on creating an advanced directive?: No - patient declined information    Additional Information 1:1 In Past 12 Months?: No CIRT Risk: No Elopement Risk: No Does patient have medical clearance?: Yes     Disposition:  Disposition Initial Assessment Completed for this Encounter: Yes Disposition of Patient: Outpatient treatment, Treatment offered and refused (Referrals provided and d/c home with mother and aunt ) Type of outpatient treatment: Adult (Referrals provided; d/c home with mother and aunt ) Type of treatment offered and refused: Other (Comment) (24  Obs in ED, psych eval in the morning ) Patient referred to: Outpatient clinic referral, Other (Comment) (Referrals  provided for local therapist/psych)  Girtha Rm 10/18/2014 10:31 PM

## 2014-10-20 ENCOUNTER — Inpatient Hospital Stay (HOSPITAL_COMMUNITY)
Admission: EM | Admit: 2014-10-20 | Discharge: 2014-10-26 | DRG: 885 | Disposition: A | Discharge status: Home / Self Care | Payer: 59 | Source: Intra-hospital | Attending: Psychiatry | Admitting: Psychiatry

## 2014-10-20 ENCOUNTER — Encounter (HOSPITAL_COMMUNITY): Payer: Self-pay | Admitting: Emergency Medicine

## 2014-10-20 ENCOUNTER — Emergency Department (HOSPITAL_COMMUNITY)
Admission: EM | Admit: 2014-10-20 | Discharge: 2014-10-20 | Disposition: A | Discharge status: Other Acute Inpatient Hospital | Payer: Managed Care, Other (non HMO) | Attending: Emergency Medicine | Admitting: Emergency Medicine

## 2014-10-20 ENCOUNTER — Emergency Department (HOSPITAL_COMMUNITY): Payer: Managed Care, Other (non HMO)

## 2014-10-20 DIAGNOSIS — R4182 Altered mental status, unspecified: Secondary | ICD-10-CM | POA: Diagnosis not present

## 2014-10-20 DIAGNOSIS — F23 Brief psychotic disorder: Principal | ICD-10-CM | POA: Diagnosis present

## 2014-10-20 DIAGNOSIS — G4700 Insomnia, unspecified: Secondary | ICD-10-CM | POA: Diagnosis present

## 2014-10-20 DIAGNOSIS — F329 Major depressive disorder, single episode, unspecified: Secondary | ICD-10-CM | POA: Diagnosis present

## 2014-10-20 DIAGNOSIS — F4322 Adjustment disorder with anxiety: Secondary | ICD-10-CM | POA: Diagnosis present

## 2014-10-20 DIAGNOSIS — F29 Unspecified psychosis not due to a substance or known physiological condition: Secondary | ICD-10-CM | POA: Insufficient documentation

## 2014-10-20 DIAGNOSIS — Z046 Encounter for general psychiatric examination, requested by authority: Secondary | ICD-10-CM | POA: Diagnosis present

## 2014-10-20 DIAGNOSIS — Z609 Problem related to social environment, unspecified: Secondary | ICD-10-CM | POA: Diagnosis present

## 2014-10-20 DIAGNOSIS — F411 Generalized anxiety disorder: Secondary | ICD-10-CM | POA: Diagnosis present

## 2014-10-20 DIAGNOSIS — Z9114 Patient's other noncompliance with medication regimen: Secondary | ICD-10-CM | POA: Diagnosis present

## 2014-10-20 LAB — COMPREHENSIVE METABOLIC PANEL
ALT: 12 U/L (ref 0–53)
AST: 22 U/L (ref 0–37)
Albumin: 5.6 g/dL — ABNORMAL HIGH (ref 3.5–5.2)
Alkaline Phosphatase: 52 U/L (ref 39–117)
Anion gap: 7 (ref 5–15)
BUN: 10 mg/dL (ref 6–23)
CO2: 29 mmol/L (ref 19–32)
Calcium: 9.8 mg/dL (ref 8.4–10.5)
Chloride: 104 mEq/L (ref 96–112)
Creatinine, Ser: 0.72 mg/dL (ref 0.50–1.35)
GFR calc Af Amer: >90 mL/min (ref >90)
GFR calc non Af Amer: >90 mL/min (ref >90)
Glucose, Bld: 102 mg/dL — ABNORMAL HIGH (ref 70–99)
Potassium: 3.9 mmol/L (ref 3.5–5.1)
Sodium: 140 mmol/L (ref 135–145)
Total Bilirubin: 0.6 mg/dL (ref 0.3–1.2)
Total Protein: 8.6 g/dL — ABNORMAL HIGH (ref 6.0–8.3)

## 2014-10-20 LAB — CBC
HCT: 44 % (ref 39.0–52.0)
Hemoglobin: 15.5 g/dL (ref 13.0–17.0)
MCH: 28.9 pg (ref 26.0–34.0)
MCHC: 35.2 g/dL (ref 30.0–36.0)
MCV: 82.1 fL (ref 78.0–100.0)
Platelets: 164 10*3/uL (ref 150–400)
RBC: 5.36 MIL/uL (ref 4.22–5.81)
RDW: 12.1 % (ref 11.5–15.5)
WBC: 7.1 10*3/uL (ref 4.0–10.5)

## 2014-10-20 LAB — RAPID URINE DRUG SCREEN, HOSP PERFORMED
Amphetamines: NOT DETECTED
Barbiturates: NOT DETECTED
Benzodiazepines: NOT DETECTED
Cocaine: NOT DETECTED
Opiates: NOT DETECTED
Tetrahydrocannabinol: NOT DETECTED

## 2014-10-20 LAB — SALICYLATE LEVEL: Salicylate Lvl: <4 mg/dL (ref 2.8–20.0)

## 2014-10-20 LAB — ACETAMINOPHEN LEVEL: Acetaminophen (Tylenol), Serum: <10 ug/mL — ABNORMAL LOW (ref 10–30)

## 2014-10-20 LAB — ETHANOL: Alcohol, Ethyl (B): <5 mg/dL (ref 0–9)

## 2014-10-20 MED ORDER — RISPERIDONE 0.5 MG PO TBDP
0.5000 mg | ORAL_TABLET | Freq: Every day | ORAL | Status: DC
  Administered 2014-10-20: 0.5 mg via ORAL
  Filled 2014-10-20: qty 1

## 2014-10-20 MED ORDER — TRAZODONE HCL 50 MG PO TABS: 25.0000 mg | ORAL_TABLET | Freq: Every day | ORAL | Status: DC

## 2014-10-20 MED ORDER — LORAZEPAM 0.5 MG PO TABS: 0.5000 mg | ORAL_TABLET | Freq: Four times a day (QID) | ORAL | Status: DC | PRN

## 2014-10-20 NOTE — BH Assessment (Signed)
Assessment Note  Nicholas Overalleter J Gosa Jr. is an 23 y.o. male who came to the emergency department with his parents due to increasingly bizzare behavior since last Friday. Mom states that he has not been leaving his room to do anything in the last couple of days including eat. Mom says that she has heard him in his room talking to someone and then talking back in a "different voice, almost like hes demon possessed"- states mom. Pt states that it feels like he is being "drawn to his room by god or the devil." Mom states that earlier today he expressed that he felt like he was "being controlled by something" and he was afraid he was going to "hurt himself or someone else". Pt presents paranoid like someone is out to get him. He says that he felt like if he left his room he would be attacked. On assessment pt presented hyper religious and focused on what was going on with him "spiritually". He states that he has had a lot of anxiety lately and says he confessed his sins the other day and that is what started his symptoms. Pt also had a break up with a girlfriend recently that he states adds to his stress.   On assessment pt had visible muscle twitches which mom stated started today. Pt denies substance abuse of any kid and mom backs this up. Pt endorses audio hallucinations and states that god "talks to him" and he hears him often. He denies visual hallucinations.   Per Dahlia ByesJosephine Onuoha NP it is recommended that pt be admitted inpatient for treatment.   Axis I: Psychotic Disorder NOS Axis II: Deferred Axis III:  Past Medical History  Diagnosis Date  . Parotid gland pain   . Nasal congestion    Axis IV: problems related to social environment Axis V: 21-30 behavior considerably influenced by delusions or hallucinations OR serious impairment in judgment, communication OR inability to function in almost all areas  Past Medical History:  Past Medical History  Diagnosis Date  . Parotid gland pain   . Nasal  congestion     History reviewed. No pertinent past surgical history.  Family History: No family history on file.  Social History:  reports that he has never smoked. He has never used smokeless tobacco. He reports that he does not drink alcohol or use illicit drugs.  Additional Social History:  Alcohol / Drug Use History of alcohol / drug use?: No history of alcohol / drug abuse  CIWA: CIWA-Ar BP: 146/96 mmHg Pulse Rate: 91 COWS:    Allergies: No Known Allergies  Home Medications:  (Not in a hospital admission)  OB/GYN Status:  No LMP for male patient.  General Assessment Data Location of Assessment: [facility] ED ACT Assessment: Yes Is this a Tele or Face-to-Face Assessment?: Face-to-Face Is this an Initial Assessment or a Re-assessment for this encounter?: Initial Assessment Living Arrangements: Parent Can pt return to current living arrangement?: Yes Admission Status: Voluntary Is patient capable of signing voluntary admission?: Yes Transfer from: Home Referral Source: Self/Family/Friend     [facility] Living Arrangements: Parent Name of Psychiatrist:  (None) Name of Therapist:  (Dr. Arita MissPace)  Education Status Is patient currently in school?: Yes Name of school:  ([employer])  Risk to self with the past 6 months Suicidal Ideation: Yes-Currently Present Suicidal Intent: No Is patient at risk for suicide?: Yes Suicidal Plan?: No Access to Means: No What has been your use of drugs/alcohol within the last  12 months?:  (Denies) Previous Attempts/Gestures: No How many times?: 0 Other Self Harm Risks:  (irratic behavior/ voices telling him to hurt himself and oth) Triggers for Past Attempts: None known Intentional Self Injurious Behavior: None Family Suicide History: No Recent stressful life event(s): Loss (Comment) (break up with girlfriend) Persecutory voices/beliefs?: Yes Depression: Yes Depression Symptoms: Isolating Substance abuse history and/or  treatment for substance abuse?: No Suicide prevention information given to non-admitted patients: Not applicable  Risk to Others within the past 6 months Homicidal Ideation: Yes-Currently Present Thoughts of Harm to Others: Yes-Currently Present Comment - Thoughts of Harm to Others:  (states he is "afraid he is going to hurt someone"- per mom) Current Homicidal Intent: No Current Homicidal Plan: No Access to Homicidal Means: No Identified Victim:  (Unknown) History of harm to others?: No Assessment of Violence: None Noted Violent Behavior Description:  (None) Does patient have access to weapons?: No Criminal Charges Pending?: No Does patient have a court date: No  Psychosis Hallucinations: Auditory Delusions: Unspecified  Mental Status Report Appear/Hygiene: Disheveled Eye Contact: Good Motor Activity: Tics Speech: Incoherent, Other (Comment) (hyper religious) Level of Consciousness: Alert Mood: Anxious, Suspicious Affect: Anxious, Fearful Anxiety Level: Moderate Thought Processes: Tangential Judgement: Impaired Orientation: Person Obsessive Compulsive Thoughts/Behaviors: Severe (wont leave his room feels like he can't )  Cognitive Functioning Concentration: Decreased Memory: Recent Impaired, Remote Impaired (mom reports memory issues) IQ: Above Average Insight: Poor Impulse Control: Fair Appetite: Poor (refuses to eat) Weight Loss: 0 Weight Gain: 0 Sleep: Decreased Total Hours of Sleep:  (unknown) Vegetative Symptoms: None  ADLScreening [facility]) Patient's cognitive ability adequate to safely complete daily activities?: Yes Patient able to express need for assistance with ADLs?: Yes Independently performs ADLs?: Yes (appropriate for developmental age)  Prior Inpatient Therapy Prior Inpatient Therapy: No  Prior Outpatient Therapy Prior Outpatient Therapy: Yes Prior Therapy [facility]ilty/[facility]  [facility] Reason for  Treatment:  (Anxiety)  ADL Screening (condition at time of admission) Patient's cognitive ability adequate to safely complete daily activities?: Yes Is the patient deaf or have difficulty hearing?: No Does the patient have difficulty seeing, even when wearing glasses/contacts?: No Does the patient have difficulty concentrating, remembering, or making decisions?: Yes Patient able to express need for assistance with ADLs?: Yes Does the patient have difficulty dressing or bathing?: No Independently performs ADLs?: Yes (appropriate for developmental age) Does the patient have difficulty walking or climbing stairs?: No Weakness of Legs: None Weakness of Arms/Hands: None  Home Assistive Devices/Equipment Home Assistive Devices/Equipment: None    Abuse/Neglect Assessment (Assessment to be complete while patient is alone) Physical Abuse: Denies Verbal Abuse: Denies Sexual Abuse: Denies Exploitation of patient/patient's resources: Denies Self-Neglect: Denies     Merchant navy officerAdvance Directives (For Healthcare) Does patient have an advance directive?: No Would patient like information on creating an advanced directive?: No - patient declined information    Additional Information 1:1 In Past 12 Months?: No CIRT Risk: No Elopement Risk: No Does patient have medical clearance?: Yes     Disposition:  Disposition Initial Assessment Completed for this Encounter: Yes Disposition of Patient: Inpatient treatment program Type of inpatient treatment program: Adult  On Site Evaluation by:  Sherre PootKristin Johnson Reviewed with Physician:  Dahlia ByesJosephine Onuoha NP    Nicholas Johnson 10/20/2014 3:00 PM' Kateri PlummerKristin Kailon Johnson, M.S., LPCA, [facility] Licensed Professional Counselor Associate  Triage Specialist  [facility]  Therapeutic Triage Services Phone: [phone] Fax: [phone]

## 2014-10-20 NOTE — ED Notes (Signed)
Family at bedside. 

## 2014-10-20 NOTE — ED Notes (Signed)
Per mom-states increased stress with school and GF-secluding himself in his room-religiously preoccupied-thinks he has done/said something wrong-poor eye contact, guarded-no history of drug use, no psyche history

## 2014-10-20 NOTE — Consult Note (Signed)
[facility] Face-to-Face Psychiatry Consult   Reason for Consult:  PSYCHOSIS Referring Physician:  EDP Nicholas J Eidem Jr. is an 23 y.o. male. Total Time spent with patient: 1 hour  Assessment: DSM5 Psychosis NOS  Past Medical History  Diagnosis Date  . Parotid gland pain   . Nasal congestion      Plan:  Recommend psychiatric Inpatient admission when medically cleared.  Subjective:   Nicholas J Oubre Jr. is a 23 y.o. male patient admitted with Psychosis.  HPI:  AA male, 23 years old who was brought in by his parents for c/o of feeling confused with his spiritual well being.  Patient stated that he is having difficulty handling"things" but could not explain what he cannot handle.  Patient stated that he is handling his affairs immaturely and that includes his relationship with God.  Patient stated that he cannot understand what is going on around him.  Patient was seen in the ER yesterday for same complaint and was advised to remain in the ER till this am to be seen by Psychiatrist but he declined and went home with his parents.  Patient denies Auditory/visual hallucination but it is documented  that patient felt like he is being controlled by higher power.  Patient reported good sleep and appetite.  He is student at [employer] and has a job as a driver for [employer].  Patient denies any drug use, seeing any mental health provider or taking any MH medications.  Patient , during this assessment had bizarre type of smile when asked certain questions.  Patient denies SI/HI/AVH, however, TTS staff documented that his mother stated that patient stated that he felt like he might hurt self or somebody.  We have accepted patient to our inpatient Psychiatric unit for safety and stabilization.  We still need his urine for urine drug screen.  Patient will be moved to our inpatient unit as soon as the room is ready as he has been assigned one.  HPI Elements:   Location:  Psychosis, confusion. Quality:  confused,  repeatedly voicing needing spiritual counseling,bizarre smile. Severity:  severe. Timing:  acute. Duration:  acute. Context:  Seeking treatment for new onset mood disorder.  Past Psychiatric History: Past Medical History  Diagnosis Date  . Parotid gland pain   . Nasal congestion     reports that he has never smoked. He has never used smokeless tobacco. He reports that he does not drink alcohol or use illicit drugs. No family history on file.   Living Arrangements: Parent Can pt return to current living arrangement?: Yes Abuse/Neglect ([facility]) Physical Abuse: Denies Verbal Abuse: Denies Sexual Abuse: Denies Allergies:  No Known Allergies  ACT Assessment Complete:  Yes:    Educational Status    Risk to Self: Risk to self with the past 6 months Suicidal Ideation: Yes-Currently Present Suicidal Intent: No Is patient at risk for suicide?: Yes Suicidal Plan?: No Access to Means: No What has been your use of drugs/alcohol within the last 12 months?:  (Denies) Previous Attempts/Gestures: No How many times?: 0 Other Self Harm Risks:  (irratic behavior/ voices telling him to hurt himself and oth) Triggers for Past Attempts: None known Intentional Self Injurious Behavior: None Family Suicide History: No Recent stressful life event(s): Loss (Comment) (break up with girlfriend) Persecutory voices/beliefs?: Yes Depression: Yes Depression Symptoms: Isolating Substance abuse history and/or treatment for substance abuse?: No (denies, no UDS currently available\) Suicide prevention information given to non-admitted patients: Not applicable  Risk to Others:   Risk to Others within the past 6 months Homicidal Ideation: Yes-Currently Present Thoughts of Harm to Others: Yes-Currently Present Comment - Thoughts of Harm to Others:  (states he is "afraid he is going to hurt someone"- per mom) Current Homicidal Intent: No Current Homicidal Plan: No Access to Homicidal Means: No Identified Victim:   (Unknown) History of harm to others?: No Assessment of Violence: None Noted Violent Behavior Description:  (None) Does patient have access to weapons?: No Criminal Charges Pending?: No Does patient have a court date: No  Abuse: Abuse/Neglect Assessment (Assessment to be complete while patient is alone) Physical Abuse: Denies Verbal Abuse: Denies Sexual Abuse: Denies Exploitation of patient/patient's resources: Denies Self-Neglect: Denies  Prior Inpatient Therapy: Prior Inpatient Therapy Prior Inpatient Therapy: No  Prior Outpatient Therapy: Prior Outpatient Therapy Prior Outpatient Therapy: Yes Prior Therapy [facility]  [facility] ) Reason for Treatment:  (Anxiety)  Additional Information: Additional Information 1:1 In Past 12 Months?: No CIRT Risk: No Elopement Risk: No Does patient have medical clearance?: Yes    Objective: Blood pressure 113/60, pulse 62, temperature 98.7 F (37.1 C), temperature source Oral, resp. rate 16, SpO2 100 %.There is no weight on file to calculate BMI. Results for orders placed or performed during the hospital encounter of 10/20/14 (from the past 72 hour(s))  CBC     Status: None   Collection Time: 10/20/14  2:15 PM  Result Value Ref Range   WBC 7.1 4.0 - 10.5 K/uL   RBC 5.36 4.22 - 5.81 MIL/uL   Hemoglobin 15.5 13.0 - 17.0 g/dL   HCT 44.0 39.0 - 52.0 %   MCV 82.1 78.0 - 100.0 fL   MCH 28.9 26.0 - 34.0 pg   MCHC 35.2 30.0 - 36.0 g/dL   RDW 12.1 11.5 - 15.5 %   Platelets 164 150 - 400 K/uL  Comprehensive metabolic panel     Status: Abnormal   Collection Time: 10/20/14  2:15 PM  Result Value Ref Range   Sodium 140 135 - 145 mmol/L    Comment: Please note change in reference range.   Potassium 3.9 3.5 - 5.1 mmol/L    Comment: Please note change in reference range.   Chloride 104 96 - 112 mEq/L   CO2 29 19 - 32 mmol/L   Glucose, Bld 102 (H) 70 - 99 mg/dL   BUN 10 6 - 23 mg/dL   Creatinine, Ser 0.72 0.50 -  1.35 mg/dL   Calcium 9.8 8.4 - 10.5 mg/dL   Total Protein 8.6 (H) 6.0 - 8.3 g/dL   Albumin 5.6 (H) 3.5 - 5.2 g/dL   AST 22 0 - 37 U/L   ALT 12 0 - 53 U/L   Alkaline Phosphatase 52 39 - 117 U/L   Total Bilirubin 0.6 0.3 - 1.2 mg/dL   GFR calc non Af Amer >90 >90 mL/min   GFR calc Af Amer >90 >90 mL/min    Comment: (NOTE) The eGFR has been calculated using the CKD EPI equation. This calculation has not been validated in all clinical situations. eGFR's persistently <90 mL/min signify possible Chronic Kidney Disease.    Anion gap 7 5 - 15  Ethanol     Status: None   Collection Time: 10/20/14  2:15 PM  Result Value Ref Range   Alcohol, Ethyl (B) <5 0 - 9 mg/dL    Comment:        LOWEST DETECTABLE LIMIT FOR SERUM ALCOHOL IS 11 mg/dL FOR MEDICAL PURPOSES ONLY  Acetaminophen level     Status: Abnormal   Collection Time: 10/20/14  2:15 PM  Result Value Ref Range   Acetaminophen (Tylenol), Serum <10.0 (L) 10 - 30 ug/mL    Comment:        THERAPEUTIC CONCENTRATIONS VARY SIGNIFICANTLY. A RANGE OF 10-30 ug/mL MAY BE AN EFFECTIVE CONCENTRATION FOR MANY PATIENTS. HOWEVER, SOME ARE BEST TREATED AT CONCENTRATIONS OUTSIDE THIS RANGE. ACETAMINOPHEN CONCENTRATIONS >150 ug/mL AT 4 HOURS AFTER INGESTION AND >50 ug/mL AT 12 HOURS AFTER INGESTION ARE OFTEN ASSOCIATED WITH TOXIC REACTIONS.   Salicylate level     Status: None   Collection Time: 10/20/14  2:15 PM  Result Value Ref Range   Salicylate Lvl <7.9 2.8 - 20.0 mg/dL   Labs are reviewed and are pertinent for Unremarkable result.  No current facility-administered medications for this encounter.   Current Outpatient Prescriptions  Medication Sig Dispense Refill  . ibuprofen (ADVIL,MOTRIN) 200 MG tablet Take 400 mg by mouth every 6 (six) hours as needed for headache (headache).      Psychiatric Specialty Exam:     Blood pressure 113/60, pulse 62, temperature 98.7 F (37.1 C), temperature source Oral, resp. rate 16, SpO2 100  %.There is no weight on file to calculate BMI.  General Appearance: Casual  Eye Contact::  Fair  Speech:  Clear and Coherent and Normal Rate  Volume:  Normal  Mood:  Anxious  Affect:  Constricted and Restricted  Thought Process:  Linear  Orientation:  Full (Time, Place, and Person)  Thought Content:  WDL  Suicidal Thoughts:  No  Homicidal Thoughts:  No  Memory:  Immediate;   Good Recent;   Good Remote;   Good  Judgement:  Fair  Insight:  Shallow  Psychomotor Activity:  Normal  Concentration:  Good  Recall:  NA  Fund of Knowledge:Fair  Language: Good  Akathisia:  NA  Handed:  Right  AIMS (if indicated):     Assets:  Desire for Improvement  Sleep:      Musculoskeletal: Strength & Muscle Tone: within normal limits Gait & Station: normal Patient leans: N/A  Treatment Plan Summary: Daily contact with patient to assess and evaluate symptoms and progress in treatment Medication management.  Patient will be moved to his room after 9 pm.  Delfin Gant   PMHNP-BC 10/20/2014 5:37 PM  Patient seen, evaluated and I agree with notes by Nurse Practitioner. Corena Pilgrim, MD

## 2014-10-20 NOTE — ED Notes (Signed)
Patient transported to CT 

## 2014-10-20 NOTE — ED Provider Notes (Signed)
CSN: [id number]     Arrival date & time 10/20/14  1233 History   First MD Initiated Contact with Patient 10/20/14 1307     Chief Complaint  Patient presents with  . Medical Clearance     (Consider location/radiation/quality/duration/timing/severity/associated sxs/prior Treatment) The history is provided by the patient and a parent. The history is limited by the condition of the patient.  pt presents w recent onset anxiety and depression.  Family states has been withdrawn, talking to self, decreased appetite, has seen hyper-religious at times, chanting/praying, and talking of conversing with another spiritual being, not God. Pt very limited historian, seemingly responding to internal stimuli, and not responding to questions and/or contributing to history - level 5 caveat.  Family denies any preceding psychiatric or medical illness. No recent unusual stressors.  Mother notes today pt voiced thoughts of suicide.      Past Medical History  Diagnosis Date  . Parotid gland pain   . Nasal congestion    History reviewed. No pertinent past surgical history. No family history on file. History  Substance Use Topics  . Smoking status: Never Smoker   . Smokeless tobacco: Never Used  . Alcohol Use: No       Review of Systems  Unable to perform ROS: Psychiatric disorder  Constitutional: Negative for fever.  level 5 caveat, pt not responding to questions.       Allergies  Review of patient's allergies indicates no known allergies.  Home Medications   Prior to Admission medications   Medication Sig Start Date End Date Taking? Authorizing Provider  ibuprofen (ADVIL,MOTRIN) 200 MG tablet Take 400 mg by mouth every 6 (six) hours as needed for headache (headache).   Yes Historical Provider, MD   BP 146/96 mmHg  Pulse 91  Temp(Src) 97.5 F (36.4 C) (Oral)  Resp 18  SpO2 100% Physical Exam  Constitutional: He appears well-developed and well-nourished. No distress.  HENT:  Head:  Atraumatic.  Mouth/Throat: Oropharynx is clear and moist.  Eyes: Conjunctivae are normal. Pupils are equal, round, and reactive to light. No scleral icterus.  Neck: Neck supple. No tracheal deviation present.  Cardiovascular: Normal rate, regular rhythm, normal heart sounds and intact distal pulses.   Pulmonary/Chest: Effort normal and breath sounds normal. No accessory muscle usage. No respiratory distress.  Abdominal: Soft. He exhibits no distension. There is no tenderness.  Musculoskeletal: Normal range of motion. He exhibits no edema or tenderness.  Neurological: He is alert.  Awake and alert. Ambulates w steady gait. Moves bil ext purposefully. Plays w phone.   Skin: Skin is warm and dry. He is not diaphoretic.  Psychiatric:  Poor eye contact. Pt rocks back and forth, w occasionally brief twitching movements. Pt appears to be responding to internal stimuli, intermittently chanting and then talking to self. Pt not directly responsive to questions.   Nursing note and vitals reviewed.   ED Course  Procedures (including critical care time) Labs Review  Results for orders placed or performed during the hospital encounter of 10/20/14  CBC  Result Value Ref Range   WBC 7.1 4.0 - 10.5 K/uL   RBC 5.36 4.22 - 5.81 MIL/uL   Hemoglobin 15.5 13.0 - 17.0 g/dL   HCT 40.944.0 81.139.0 - 91.452.0 %   MCV 82.1 78.0 - 100.0 fL   MCH 28.9 26.0 - 34.0 pg   MCHC 35.2 30.0 - 36.0 g/dL   RDW 78.212.1 95.611.5 - 21.315.5 %   Platelets 164 150 - 400 K/uL  Comprehensive  metabolic panel  Result Value Ref Range   Sodium 140 135 - 145 mmol/L   Potassium 3.9 3.5 - 5.1 mmol/L   Chloride 104 96 - 112 mEq/L   CO2 29 19 - 32 mmol/L   Glucose, Bld 102 (H) 70 - 99 mg/dL   BUN 10 6 - 23 mg/dL   Creatinine, Ser 1.610.72 0.50 - 1.35 mg/dL   Calcium 9.8 8.4 - 09.610.5 mg/dL   Total Protein 8.6 (H) 6.0 - 8.3 g/dL   Albumin 5.6 (H) 3.5 - 5.2 g/dL   AST 22 0 - 37 U/L   ALT 12 0 - 53 U/L   Alkaline Phosphatase 52 39 - 117 U/L   Total  Bilirubin 0.6 0.3 - 1.2 mg/dL   GFR calc non Af Amer >90 >90 mL/min   GFR calc Af Amer >90 >90 mL/min   Anion gap 7 5 - 15  Ethanol  Result Value Ref Range   Alcohol, Ethyl (B) <5 0 - 9 mg/dL  Acetaminophen level  Result Value Ref Range   Acetaminophen (Tylenol), Serum <10.0 (L) 10 - 30 ug/mL  Salicylate level  Result Value Ref Range   Salicylate Lvl <4.0 2.8 - 20.0 mg/dL   Ct Head Wo Contrast  10/20/2014   CLINICAL DATA:  Depression.  Dilution.  Hallucinations.  EXAM: CT HEAD WITHOUT CONTRAST  TECHNIQUE: Contiguous axial images were obtained from the base of the skull through the vertex without intravenous contrast.  COMPARISON:  None.  FINDINGS: The brain has a normal appearance without evidence of atrophy, infarction, mass lesion, hemorrhage, hydrocephalus or extra-axial collection. The calvarium is unremarkable. The paranasal sinuses, middle ears and mastoids are clear. 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  IMPRESSION: Normal head CT.   Electronically Signed   By: Paulina FusiMark  Shogry M.D.   On: 10/20/2014 15:20        MDM  Labs.   As ?first psychotic episode, will get ct.  Reviewed nursing notes and prior charts for additional history.   Psych team consulted.  It appears on review of recent visit that psychiatrist did not evaluate pt during that ED stay - I feel pt does need psychiatry eval.    dispo per psych team.  Recheck pt comfortable, alert, nad. Awaiting psych team eval.  Pt/fam updated on plan.    Suzi RootsKevin E Kiptyn Rafuse, MD 10/20/14 [phone]

## 2014-10-20 NOTE — ED Notes (Signed)
Pt. Is unable to use the use the restroom at this time, but is aware that we need a urine specimen. Urine cup at bedside.

## 2014-10-20 NOTE — [facility] Counselor (Signed)
Per Berne[facility] Heinrichina Tate, [facility]. Can be transported after 9p  Kateri PlummerKristin Noboru Bidinger, M.S., LPCA, [facility] Licensed Professional Counselor Associate  Triage Specialist  [facility]  Therapeutic Triage Services Phone: [phone] Fax: [phone]

## 2014-10-20 NOTE — ED Notes (Signed)
[facility] called. 

## 2014-10-20 NOTE — ED Notes (Signed)
Pt brought in by family along with [employer], Pt was just seen on Dec 27 for similar symptoms. Pt has confusion and becoming agitated. Pt has been cooperative so far.

## 2014-10-20 NOTE — BH Assessment (Signed)
Writer informed TTS Kristin of the consult.  

## 2014-10-21 ENCOUNTER — Encounter (HOSPITAL_COMMUNITY): Payer: Self-pay | Admitting: *Deleted

## 2014-10-21 DIAGNOSIS — F29 Unspecified psychosis not due to a substance or known physiological condition: Secondary | ICD-10-CM | POA: Diagnosis present

## 2014-10-21 DIAGNOSIS — F209 Schizophrenia, unspecified: Secondary | ICD-10-CM

## 2014-10-21 HISTORY — DX: Unspecified psychosis not due to a substance or known physiological condition: F29

## 2014-10-21 MED ORDER — MAGNESIUM HYDROXIDE 400 MG/5ML PO SUSP: 30.0000 mL | Freq: Every day | ORAL | Status: DC | PRN

## 2014-10-21 MED ORDER — OLANZAPINE 10 MG IM SOLR: 5.0000 mg | Freq: Four times a day (QID) | INTRAMUSCULAR | Status: DC | PRN

## 2014-10-21 MED ORDER — BENZTROPINE MESYLATE 0.5 MG PO TABS
0.5000 mg | ORAL_TABLET | Freq: Two times a day (BID) | ORAL | Status: DC
  Administered 2014-10-22 – 2014-10-26 (×9): 0.5 mg via ORAL
  Filled 2014-10-21 (×3): qty 1
  Filled 2014-10-21: qty 28
  Filled 2014-10-21 (×7): qty 1
  Filled 2014-10-21: qty 28
  Filled 2014-10-21 (×2): qty 1

## 2014-10-21 MED ORDER — OLANZAPINE 5 MG PO TBDP: 5.0000 mg | ORAL_TABLET | Freq: Four times a day (QID) | ORAL | Status: DC | PRN

## 2014-10-21 MED ORDER — ALUM & MAG HYDROXIDE-SIMETH 200-200-20 MG/5ML PO SUSP: 30.0000 mL | ORAL | Status: DC | PRN

## 2014-10-21 MED ORDER — TRAZODONE HCL 50 MG PO TABS: 50.0000 mg | ORAL_TABLET | Freq: Every evening | ORAL | Status: DC | PRN

## 2014-10-21 MED ORDER — HYDROXYZINE HCL 25 MG PO TABS
25.0000 mg | ORAL_TABLET | Freq: Three times a day (TID) | ORAL | Status: DC | PRN
  Filled 2014-10-21: qty 20

## 2014-10-21 MED ORDER — HALOPERIDOL 5 MG PO TABS
5.0000 mg | ORAL_TABLET | Freq: Two times a day (BID) | ORAL | Status: DC
  Administered 2014-10-22 – 2014-10-26 (×9): 5 mg via ORAL
  Filled 2014-10-21 (×6): qty 1
  Filled 2014-10-21: qty 28
  Filled 2014-10-21 (×5): qty 1
  Filled 2014-10-21: qty 28
  Filled 2014-10-21: qty 1

## 2014-10-21 MED ORDER — TRAZODONE HCL 50 MG PO TABS
50.0000 mg | ORAL_TABLET | Freq: Every day | ORAL | Status: DC
  Filled 2014-10-21 (×4): qty 1

## 2014-10-21 MED ORDER — ACETAMINOPHEN 325 MG PO TABS: 650.0000 mg | ORAL_TABLET | Freq: Four times a day (QID) | ORAL | Status: DC | PRN

## 2014-10-21 NOTE — Tx Team (Addendum)
Initial Interdisciplinary Treatment Plan   PATIENT STRESSORS: religious preoccupation     PATIENT STRENGTHS: Average or above average intelligence Capable of independent living Communication skills Financial means General fund of knowledge Religious Affiliation Supportive family/friends   PROBLEM LIST: Problem List/Patient Goals Date to be addressed Date deferred Reason deferred Estimated date of resolution  "dealing with some spiritual things" 10-21-14     "parents wanted me to come make sure I wasn't tripping out" 10-21-14     Laughing inappropriately, preoccupied 10-21-14     suspicious 10-21-14                                    DISCHARGE CRITERIA:  Improved stabilization in mood, thinking, and/or behavior Motivation to continue treatment in a less acute level of care Verbal commitment to aftercare and medication compliance  PRELIMINARY DISCHARGE PLAN: Attend aftercare/continuing care group Outpatient therapy Participate in family therapy  PATIENT/FAMIILY INVOLVEMENT: This treatment plan has been presented to and reviewed with the patient, Nicholas Overalleter J Shafer Jr., and/or family member.  The patient and family have been given the opportunity to ask questions and make suggestions.  [name], Tinsley Lomas N 10/21/2014, 2:21 AM

## 2014-10-21 NOTE — Progress Notes (Signed)
D:Patient in his room on approach.  Patient displaying bizarre behavior but redirectable.  Patient denies SI/HI but states he does hear voice but will not elaborate on them.  Patient verbally contracts for safety.  Patient laughs inappropriately.  Patient has been calling his mother on the phone tonight but states she frustrated him when they talk.  Patient gave verbal permission for his mother to have medical information. Her name was added to the list tonight.  Patient did appear to calm down closer to bedtime after he took his medications he refused earlier today. A: Staff to monitor Q 15 mins for safety.  Encouragement and support offered.  Scheduled medications administered per orders. R: Patient remains safe on the unit.  Patient attended group tonight.  Patient visible on the unit.  Patient taking admibnistered medications.

## 2014-10-21 NOTE — [facility] Group Notes (Signed)
[facility] LCSW Group Therapy  10/21/2014 2:57 PM  Type of Therapy:  Group Therapy  Participation Level:  Minimal  Participation Quality:  Attentive  Affect:  Appropriate  Cognitive:  Lacking  Insight:  Limited  Engagement in Therapy:  Limited  Modes of Intervention:  Confrontation, Discussion, Education, Exploration, Limit-setting, Problem-solving, Rapport Building, Socialization and Support  Summary of Progress/Problems:  Finding Balance in Life. Today's group focused on defining balance in one's own words, identifying things that can knock one off balance, and exploring healthy ways to maintain balance in life. Group members were asked to provide an example of a time when they felt off balance, describe how they handled that situation,and process healthier ways to regain balance in the future. Group members were asked to share the most important tool for maintaining balance that they [facility] and how they plan to apply this method after discharge. Nicholas Johnson was attentive during today's processing group but did not actively participate in group discussion unless prompted by CSW. Pt presented with depressed mood/flat affect but brightened when speaking. Nicholas Johnson shared that for him, balance means "keeping God first, doing what's right and fair, loving others, and loving myself." Nicholas Johnson stated that this is easy for him but communicating with people about what is going on with him is difficult at times. Nicholas Johnson shared that he enjoys playing pool when asked about fun activities and self care.    Smart, Rinda Rollyson LCSWA  10/21/2014, 2:57 PM

## 2014-10-21 NOTE — Progress Notes (Signed)
Spoke with the mother Delaney Meigs( Judy Preyer) on the phone at 1301 hrs after the patient asked me to speak with her. Mother informed this Clinical research associatewriter that the patient told her that he has been having some bad thoughts and has been placing his hands around his neck. Mother also informed me that he is not telling the staff members. The mother stated " I felt like you all should know". Informed mother that a note would be placed in the chart and communicated to the psychiatrist.

## 2014-10-21 NOTE — Progress Notes (Signed)
Pt attend wrap-up group. Pt stated that he had a good day and is looking forward to getting help while he is here.

## 2014-10-21 NOTE — Progress Notes (Signed)
Patient ID: Nicholas OverallPeter J Lanese Jr., male   DOB: 01/27/1991, 23 y.o.   MRN: [id number] Client is a 23 yo male admitted due to psychosis, per consult client was brought in by his parents because he was confused with his spirituality. Client also reported some confusion after a personal situation which lead him to being unsure about things so he began writing everything down in an effort not to forget. Parents reportedly told ED physician that client had locked himself in his room for over 24 hours, scribbling different things on multiple pieces of paper. This is clients first inpatient admission, he is a Consulting civil engineerstudent at [employer], works as a Hospital doctordriver for [employer]. During this admission process the client is suspicious and animated, laughing and smiling inappropriately, moderately preoccupied, although he denies AVH. Client reports he came because "I'm dealing with some spiritual things" "my parents was concerned, wanted to make sure I was stable, make sure I wasn't tripping out" Client does admit to a break up with a GF about a month ago, "we decided to give ourselves some space, but she really wasn't my GF" Client has no significant medical history.  Writer reviewed admission forms, client agreed to treatment, was oriented to unit and room. Client refused sleep medications, noting that he slept well.

## 2014-10-21 NOTE — [facility] Group Notes (Signed)
[facility]   10/21/2014 10:45 AM  Participation Quality:  Appropriate   Mood/Affect:  Appropriate  Depression Rating:  0  Anxiety Rating:  0  Thoughts of Suicide:  No Will you contract for safety?   NA  Current AVH:  No  Plan for Discharge/Comments:  Pt reports that he had a spiritual event yesterday that left him confused and that scared his parents into bringing him into the hospital. Pt reports that he goes [facility] [employer] and works part-time-will need letter for work at d/c. Pt sees pastor for counseling but is open to referral for another therapist "that is at least in their 40's and has experience." Pt reports problems with communicating his thoughts and struggling with aspects of his spirituality. Pt pleasant and cooperative during group. Reports that he takes no mental health meds and does not plan to take meds at d/c.   Transportation Means: parent/mom   Supports: parents, friends, Nutritional therapistpastor   Smart, OncologistHeather LCSWA

## 2014-10-21 NOTE — [facility] Suicide Risk Assessment (Signed)
   Nursing information obtained from:  Patient Demographic factors:  Unemployed Current Mental Status:  NA Loss Factors:  Loss of significant relationship Historical Factors:  NA Risk Reduction Factors:  Positive social support, Positive therapeutic relationship Total Time spent with patient: 45 minutes  CLINICAL FACTORS:   Currently Psychotic  Psychiatric Specialty Exam: Physical Exam  ROS  Blood pressure 113/69, pulse 90, temperature 97.7 F (36.5 C), temperature source Oral, resp. rate 18, height 5' 7.5" (1.715 m), weight 53.071 kg (117 lb), SpO2 100 %.Body mass index is 18.04 kg/(m^2).  General Appearance: Casual  Eye Contact::  Minimal  Speech:  Blocked  Volume:  Normal  Mood:  Anxious  Affect:  Congruent  Thought Process:  Disorganized  Orientation:  Full (Time, Place, and Person)  Thought Content:  Delusions and Hallucinations: Auditory  Suicidal Thoughts:  No  Homicidal Thoughts:  No  Memory:  Immediate;   Fair Recent;   Fair Remote;   Fair  Judgement:  Impaired  Insight:  Lacking  Psychomotor Activity:  Increased  Concentration:  Poor  Recall:  Poor  Fund of Knowledge:Fair  Language: Fair  Akathisia:  No  Handed:  Right  AIMS (if indicated):     Assets:  Physical Health Social Support  Sleep:  Number of Hours: 3.75   Musculoskeletal: Strength & Muscle Tone: within normal limits Gait & Station: normal Patient leans: N/A  COGNITIVE FEATURES THAT CONTRIBUTE TO RISK:  Closed-mindedness Polarized thinking Thought constriction (tunnel vision)    SUICIDE RISK:   Mild:  Suicidal ideation of limited frequency, intensity, duration, and specificity.  There are no identifiable plans, no associated intent, mild dysphoria and related symptoms, good self-control (both objective and subjective assessment), few other risk factors, and identifiable protective factors, including available and accessible social support.  PLAN OF CARE:Please see H&P.   I certify that  inpatient services furnished can reasonably be expected to improve the patient's condition.  Tashawnda Bleiler MD 10/21/2014, 2:33 PM

## 2014-10-21 NOTE — Progress Notes (Signed)
D: Patient states " Im confused"; patient states " when I pray I start to have bad thoughts, im just trying to stay calm but when I pray I start to have thoughts and actions but Im trying not to do it because its not my character"; patient statess " I don't want medicine"  A: Monitored q 15 minutes; patient encouraged to attend groups; patient educated about medications; patient given medications per physician orders; patient encouraged to express feelings and/or concerns  R: Patient is cooperative; patient is cautious of what he tells the staff; patient states with a smile " I don't want you all to think Im tripping" patient's interaction with staff and peers is minimal; patient was able to set goal to talk with staff 1:1 when having feelings of SI;patient attended the afternoon social work group

## 2014-10-21 NOTE — [facility] Counselor (Signed)
Adult Comprehensive Assessment  Patient ID: Nicholas Overalleter J Pagliuca Jr., male   DOB: 02/23/1991, 23 y.o.   MRN: [id number]  Information Source: Information source: Patient  Current Stressors:   recent breakup with gf (one month ago) "Spiritual confusion"/pt vague about events that led to hospitalization  Living/Environment/Situation:  Living Arrangements: Alone Living conditions (as described by patient or guardian): pt reports that he lives alone in apt near school. How long has patient lived in current situation?: 2 years  What is atmosphere in current home: Comfortable  Family History:  Marital status: Single Does patient have children?: No  Childhood History:  By whom was/is the patient raised?: Both parents Additional childhood history information: "I had a normal childhood and good parents. They are married still."  Description of patient's relationship with caregiver when they were a child: close to both parents  Patient's description of current relationship with people who raised him/her: close to both parents. "They were concerned because they didn't understand what was going on with me. They love me."  Does patient have siblings?: Yes Number of Siblings: 3 Description of patient's current relationship with siblings: close to siblings. "I'm in the middle." pt reports that he comes from a tight-knit family.  Did patient suffer any verbal/emotional/physical/sexual abuse as a child?: No Did patient suffer from severe childhood neglect?: No Has patient ever been sexually abused/assaulted/raped as an adolescent or adult?: No Witnessed domestic violence?: No Has patient been effected by domestic violence as an adult?: No  Education:  Highest grade of school patient has completed: Holiday representativejunior in college at [employer]  Currently a student?: Yes If yes, how has current illness impacted academic performance: n/a  Name of school: see above Contact person: none  How long has the  patient attended?: 3 years  Learning disability?: No  Employment/Work Situation:   Employment situation: Employed Where is patient currently employed?: 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 How long has patient been employed?: April 2015-current  Patient's job has been impacted by current illness: Yes Describe how patient's job has been impacted: potentially missing work due to hospitalization  What is the longest time patient has a held a job?: see above Where was the patient employed at that time?: see above  Has patient ever been in the [employer] and Companymilitary?: No Has patient ever served in combat?: No  Financial Resources:   Financial resources: Income from employment, Support from parents / caregiver, Private insurance Does patient have a representative payee or guardian?: No  Alcohol/Substance Abuse:   What has been your use of drugs/alcohol within the last 12 months?: pt reports no alcohol or drug use in past 12 mo "I used marijuana a few times in high school; over 4 years ago."  If attempted suicide, did drugs/alcohol play a role in this?: No Alcohol/Substance Abuse Treatment Hx: Denies past history If yes, describe treatment: n/a  Has alcohol/substance abuse ever caused legal problems?: No  Social Support System:   Conservation officer, natureatient's Community Support System: Good Describe Community Support System: "I have good friends at work and school and a supportive family." Type of faith/religion: Christian-pt appears to be religiously preoccupied, stating that his reason for confusion and "the event that led to my admission is that I'm having a spiritual crisis and figuring things out."  How does patient's faith help to cope with current illness?: yes-prayer and reading the bible. Pt was reading bible intermittently during assessment.   Leisure/Recreation:   Leisure and Hobbies: playing sports  Strengths/Needs:   What things does the patient do well?: nice person; calm; intelligent  In  what areas does patient struggle / problems for patient: confusion, spiritual confusion   Discharge Plan:   Does patient have access to transportation?: Yes (pt states that he has car and license) Will patient be returning to same living situation after discharge?: Yes Currently receiving community mental health services: No If no, would patient like referral for services when discharged?: Yes (What county?) (seeking therapy referral ) Does patient have financial barriers related to discharge medications?: No  Summary/Recommendations:   Pt is a 23 y.o. male who came to the ED with his parents due to increasingly bizzare behavior since last Friday. Mom states that he has not been leaving his room to do anything in the last couple of days including eat. Mom says that she has heard him in his room talking to someone and then talking back in a "different voice, almost like hes demon possessed"- states mom. Pt states that it feels like he is being "drawn to his room by god or the devil." Mom states that earlier today he expressed that he felt like he was "being controlled by something" and he was afraid he was going to "hurt himself or someone else". Pt presents paranoid like someone is out to get him. He says that he felt like if he left his room he would be attacked. On assessment pt presented hyper religious and focused on what was going on with him "spiritually". He states that he has had a lot of anxiety lately and says he confessed his sins the other day and that is what started his symptoms. Pt also had a break up with a girlfriend recently that he states adds to his stress.  Pt reports no AVH/SI/HI currently. He is Consulting civil engineerstudent Forensic scientist(junior) at [employer] and works part time at [employer].   Recommendations for pt include: crisis stabilization, therapeutic milieu, encourage group attendance and participation, and development of comprehensive emntal wellness plan. Pt not interested in med management at  this time but is seeking referral for therapist "preferably someone older with at least 12-13 years of experience." Pt reports that he sees his pastor for counseling as well. CSW assessing.   Smart, CenterHeather LCSWA  10/21/2014

## 2014-10-21 NOTE — Tx Team (Signed)
Interdisciplinary Treatment Plan Update (Adult)   Date: 10/21/2014  Time Reviewed: 12:12 PM  Progress in Treatment:  Attending groups: yes  Participating in groups:  Yes   Taking medication as prescribed: Yes  Tolerating medication: Yes  Family/Significant othe contact made: Not yet. SPE required for this pt.   Patient understands diagnosis: Yes, AEB seeking treatment for confusion, mood instability, and for medication stabilization.  Discussing patient identified problems/goals with staff: Yes  Medical problems stabilized or resolved: Yes  Denies suicidal/homicidal ideation: Yes during group/self report.  Patient has not harmed self or Others: Yes  New problem(s) identified:  Discharge Plan or Barriers: CSW assessing. Pt reports that he sees his pastor for private counseling but is interested in another therapist "preferably older with at least 12-13 years of experience." Pt interested in psychiatrist and minimally interested in med management at this time. Pt plans that he plans to return to his apt at d/c and return to school and work.  Additional comments:  Client is a 23 yo male admitted due to psychosis, per consult client was brought in by his parents because he was confused with his spirituality. Client also reported some confusion after a personal situation which lead him to being unsure about things so he began writing everything down in an effort not to forget. Parents reportedly told ED physician that client had locked himself in his room for over 24 hours, scribbling different things on multiple pieces of paper. This is clients first inpatient admission, he is a Consulting civil engineerstudent at [employer], works as a Hospital doctordriver for [employer]. During this admission process the client is suspicious and animated, laughing and smiling inappropriately, moderately preoccupied, although he denies AVH. Client reports he came because "I'm dealing with some spiritual things" "my parents was concerned, wanted to make sure I  was stable, make sure I wasn't tripping out" Client does admit to a break up with a GF about a month ago, "we decided to give ourselves some space, but she really wasn't my GF" Client has no significant medical history. Writer reviewed admission forms, client agreed to treatment, was oriented to unit and room. Client refused sleep medications, noting that he slept well.          Reason for Continuation of Hospitalization: Sx of psychosis-bizarre behaviors, suspicious, animated Mood stabilization Medication management  Estimated length of stay: 5-7 days  For review of initial/current patient goals, please see plan of care.  Attendees:  Patient:    Family:    Physician: Dr. Elna BreslowEappen, MD  10/21/2014 12:14 PM   Nursing: Blenda MountsJan, Troy, Sara RN 10/21/2014 12:14 PM   Clinical Social Worker Jamy Whyte Smart, LCSWA  10/21/2014 12:14 PM   Other: Santa GeneraAnne Cunningham, LCSW 10/21/2014 12:14 PM   Other: Mercy RidingValerie, [facility] TCT  10/21/2014 12:14 PM   Other:    Other:    Scribe for Treatment Team:  Trula SladeHeather Smart LCSWA 10/21/2014 12:15 PM

## 2014-10-21 NOTE — H&P (Signed)
Psychiatric Admission Assessment Adult  Patient Identification:  Nicholas Johnson. Date of Evaluation:  10/21/2014 Chief Complaint:  "I feel like I have sinned against God."  History of Present Illness::   Nicholas Johnson. is an 23 y.o. male who came to the [facility] with his parents due to increasingly bizzare behavior since last Friday. His mother reported that the patient has not been leaving his room and has been heard talking to himself. His mother also expressed concerns that Murrell felt he was being controlled by something. Since admission the patient has been very hyper religious only wanting to focus on his spiritual problems. Patient stated during his psychiatric assessment "I was misunderstood about some spiritual stuff. Everything would be fine if I could just figure it out. I was just speaking outside of myself. I have had a lot on my plate lately. I am a Christian. My father said that some Christians can hear God talking to them. I am one of those. God uses my mouth. I have sinned in the past and have angered God. I have been sad. I just want to deal with all this." The patient was observed to have written many comments about spirits on sheets of paper on his bed. One comment stated that God was not pleased with something the patient did. The patient reports the symptoms have only been bothering him for a few weeks. He denies any history of mania but indicated having some feelings of paranoia. Any question that is asked of the patient would be answered with a response about his spiritual struggles. The patient denies any current substance use. His current urine drug screen and alcohol were negative. He denies having any history of mental illness. A CT scan of the head done in the [facility] was negative for organic causes. He was also observed to smile widely when various questions were asked to clarify his symptoms.   Elements:  Location:  [facility] . Quality:   voicing need for spiritual counseling, bizarre behavior. Severity:  Severe. Timing:  Last three weeks. Duration:  acute . Context:  Parents concerned about new onset psychotic symptoms. Associated Signs/Synptoms: Depression Symptoms:  depressed mood, anhedonia, psychomotor retardation, feelings of worthlessness/guilt, anxiety, (Hypo) Manic Symptoms:  Elevated Mood, Anxiety Symptoms:  Excessive Worry, Psychotic Symptoms:  Delusions, Hallucinations: Auditory PTSD Symptoms: NA Total Time spent with patient: 1 hour  Psychiatric Specialty Exam: Physical Exam  Constitutional:  Physical exam findings reviewed from the [facility] and I agree with no noted exceptions.     Review of Systems  Constitutional: Negative for fever, chills, weight loss, malaise/fatigue and diaphoresis.  HENT: Negative for congestion, ear discharge, ear pain, hearing loss, nosebleeds, sore throat and tinnitus.   Eyes: Negative for blurred vision, double vision, photophobia, pain, discharge and redness.  Respiratory: Negative for cough, hemoptysis, sputum production, shortness of breath, wheezing and stridor.   Cardiovascular: Negative for chest pain, palpitations, orthopnea, claudication, leg swelling and PND.  Gastrointestinal: Negative for heartburn, nausea, vomiting, abdominal pain, diarrhea, constipation, blood in stool and melena.  Genitourinary: Negative for dysuria, urgency, frequency and hematuria.  Musculoskeletal: Negative for myalgias, back pain, joint pain and neck pain.  Skin: Negative for itching and rash.  Neurological: Negative for dizziness, tingling, tremors, sensory change, speech change, focal weakness, seizures, weakness and headaches.  Endo/Heme/Allergies: Negative for environmental allergies and polydipsia. Does not bruise/bleed easily.  Psychiatric/Behavioral: Positive for hallucinations. The patient is nervous/anxious and has insomnia.  Blood pressure 113/69, pulse 90, temperature 97.7 F  (36.5 C), temperature source Oral, resp. rate 18, height 5' 7.5" (1.715 m), weight 53.071 kg (117 lb), SpO2 100 %.Body mass index is 18.04 kg/(m^2).  General Appearance: Casual  Eye Contact::  Fair  Speech:  Clear and Coherent and Normal Rate  Volume:  Normal  Mood:  Anxious  Affect:  Bizarre at times   Thought Process:  Circumstantial  Orientation:  Full (Time, Place, and Person)  Thought Content:  Delusions and Hallucinations: Auditory  Suicidal Thoughts:  No  Homicidal Thoughts:  No  Memory:  Immediate;   Good Recent;   Fair Remote;   Fair  Judgement:  Impaired  Insight:  Lacking  Psychomotor Activity:  Normal  Concentration:  Poor  Recall:  [employer] of Knowledge:Fair  Language: Good  Akathisia:  No  Handed:  Right  AIMS (if indicated):     Assets:  Communication Skills Desire for Improvement Leisure Time Physical Health Resilience Social Support Talents/Skills Vocational/Educational  Sleep:  Number of Hours: 3.75    Musculoskeletal: Strength & Muscle Tone: within normal limits Gait & Station: normal Patient leans: N/A  Past Psychiatric History: Diagnosis: Denies  Hospitalizations: Denies  Outpatient Care: Denies  Substance Abuse Care: Denies  Self-Mutilation: Denies  Suicidal Attempts: Denies  Violent Behaviors: Denies   Past Medical History:   Past Medical History  Diagnosis Date  . Parotid gland pain   . Nasal congestion    None. Allergies:  No Known Allergies PTA Medications: Prescriptions prior to admission  Medication Sig Dispense Refill Last Dose  . ibuprofen (ADVIL,MOTRIN) 200 MG tablet Take 400 mg by mouth every 6 (six) hours as needed for headache (headache).   Past Month at Unknown time    Previous Psychotropic Medications:  Medication/Dose  Denies taking any previous psychiatric medications               Substance Abuse History in the last 12 months:  No.  Consequences of Substance Abuse: NA  Social History:  reports  that he has never smoked. He has never used smokeless tobacco. He reports that he does not drink alcohol or use illicit drugs. Additional Social History:                      Current Place of Residence:  [place], [place] of Birth: [place], [place] Family Members: Parents Marital Status:  Single Children:0  Sons:  Daughters: Relationships: Education:  "I'm currently a Emergency planning/management officer at [employer] Problems/Performance: Religious Beliefs/Practices: Christian History of Abuse (Emotional/Phsycial/Sexual)-Denies Ship broker History:  None. Legal History: Denies Hobbies/Interests: Studying the Bible  Family History:  History reviewed. No pertinent family history. Denies any history of mental health problems in his family.   Results for orders placed or performed during the hospital encounter of 10/20/14 (from the past 72 hour(s))  CBC     Status: None   Collection Time: 10/20/14  2:15 PM  Result Value Ref Range   WBC 7.1 4.0 - 10.5 K/uL   RBC 5.36 4.22 - 5.81 MIL/uL   Hemoglobin 15.5 13.0 - 17.0 g/dL   HCT 44.0 39.0 - 52.0 %   MCV 82.1 78.0 - 100.0 fL   MCH 28.9 26.0 - 34.0 pg   MCHC 35.2 30.0 - 36.0 g/dL   RDW 12.1 11.5 - 15.5 %   Platelets 164 150 - 400 K/uL  Comprehensive metabolic panel     Status: Abnormal   Collection  Time: 10/20/14  2:15 PM  Result Value Ref Range   Sodium 140 135 - 145 mmol/L    Comment: Please note change in reference range.   Potassium 3.9 3.5 - 5.1 mmol/L    Comment: Please note change in reference range.   Chloride 104 96 - 112 mEq/L   CO2 29 19 - 32 mmol/L   Glucose, Bld 102 (H) 70 - 99 mg/dL   BUN 10 6 - 23 mg/dL   Creatinine, Ser 9.39 0.50 - 1.35 mg/dL   Calcium 9.8 8.4 - 36.7 mg/dL   Total Protein 8.6 (H) 6.0 - 8.3 g/dL   Albumin 5.6 (H) 3.5 - 5.2 g/dL   AST 22 0 - 37 U/L   ALT 12 0 - 53 U/L   Alkaline Phosphatase 52 39 - 117 U/L   Total Bilirubin 0.6 0.3 - 1.2 mg/dL   GFR calc non Af Amer >90  >90 mL/min   GFR calc Af Amer >90 >90 mL/min    Comment: (NOTE) The eGFR has been calculated using the CKD EPI equation. This calculation has not been validated in all clinical situations. eGFR's persistently <90 mL/min signify possible Chronic Kidney Disease.    Anion gap 7 5 - 15  Ethanol     Status: None   Collection Time: 10/20/14  2:15 PM  Result Value Ref Range   Alcohol, Ethyl (B) <5 0 - 9 mg/dL    Comment:        LOWEST DETECTABLE LIMIT FOR SERUM ALCOHOL IS 11 mg/dL FOR MEDICAL PURPOSES ONLY   Acetaminophen level     Status: Abnormal   Collection Time: 10/20/14  2:15 PM  Result Value Ref Range   Acetaminophen (Tylenol), Serum <10.0 (L) 10 - 30 ug/mL    Comment:        THERAPEUTIC CONCENTRATIONS VARY SIGNIFICANTLY. A RANGE OF 10-30 ug/mL MAY BE AN EFFECTIVE CONCENTRATION FOR MANY PATIENTS. HOWEVER, SOME ARE BEST TREATED AT CONCENTRATIONS OUTSIDE THIS RANGE. ACETAMINOPHEN CONCENTRATIONS >150 ug/mL AT 4 HOURS AFTER INGESTION AND >50 ug/mL AT 12 HOURS AFTER INGESTION ARE OFTEN ASSOCIATED WITH TOXIC REACTIONS.   Salicylate level     Status: None   Collection Time: 10/20/14  2:15 PM  Result Value Ref Range   Salicylate Lvl <4.0 2.8 - 20.0 mg/dL  Urine rapid drug screen (hosp performed)     Status: None   Collection Time: 10/20/14  5:05 PM  Result Value Ref Range   Opiates NONE DETECTED NONE DETECTED   Cocaine NONE DETECTED NONE DETECTED   Benzodiazepines NONE DETECTED NONE DETECTED   Amphetamines NONE DETECTED NONE DETECTED   Tetrahydrocannabinol NONE DETECTED NONE DETECTED   Barbiturates NONE DETECTED NONE DETECTED    Comment:        DRUG SCREEN FOR MEDICAL PURPOSES ONLY.  IF CONFIRMATION IS NEEDED FOR ANY PURPOSE, NOTIFY LAB WITHIN 5 DAYS.        LOWEST DETECTABLE LIMITS FOR URINE DRUG SCREEN Drug Class       Cutoff (ng/mL) Amphetamine      1000 Barbiturate      200 Benzodiazepine   200 Tricyclics       300 Opiates          300 Cocaine           300 THC              50    Psychological Evaluations:  Assessment:   DSM5:  AXIS I:  Unspecified Schizophrenia Spectrum and other  Psychotic Disorder  AXIS II:  Deferred AXIS III:   Past Medical History  Diagnosis Date  . Parotid gland pain   . Nasal congestion    AXIS IV:  other psychosocial or environmental problems and problems related to social environment AXIS V:  31-40 impairment in reality testing  Treatment Plan/Recommendations:   1. Admit for crisis management and stabilization. Estimated length of stay 5-7 days. 2. Medication management to reduce current symptoms to base line and improve the patient's level of functioning.  3. Develop treatment plan to decrease risk of relapse upon discharge of psychotic symptoms and the need for readmission. 5. Group therapy to facilitate development of healthy coping skills to use for depression and anxiety. 6. Health care follow up as needed for medical problems.  7. Discharge plan to include therapy to help patient cope with  stressors.  8. Call for Consult with Hospitalist for additional specialty patient services as needed.   Treatment Plan Summary: Daily contact with patient to assess and evaluate symptoms and progress in treatment Medication management Current Medications:  Current Facility-Administered Medications  Medication Dose Route Frequency Provider Last Rate Last Dose  . acetaminophen (TYLENOL) tablet 650 mg  650 mg Oral Q6H PRN Delfin Gant, NP      . alum & mag hydroxide-simeth (MAALOX/MYLANTA) 200-200-20 MG/5ML suspension 30 mL  30 mL Oral Q4H PRN Delfin Gant, NP      . benztropine (COGENTIN) tablet 0.5 mg  0.5 mg Oral BID Elmarie Shiley, NP      . haloperidol (HALDOL) tablet 5 mg  5 mg Oral BID Elmarie Shiley, NP      . magnesium hydroxide (MILK OF MAGNESIA) suspension 30 mL  30 mL Oral Daily PRN Delfin Gant, NP      . traZODone (DESYREL) tablet 50 mg  50 mg Oral QHS PRN Elmarie Shiley, NP         Observation Level/Precautions:  15 minute checks  Laboratory:  CBC Chemistry Profile UDS  Psychotherapy:  Individual and Group Therapy   Medications:  Start Haldol 5 mg BID for psychosis, Cogentin 0.5 mg BID for EPS prevention   Consultations:  As needed  Discharge Concerns:  Safety and Stability   Estimated LOS: 3-5 days  Other:  Increase collateral information from family    I certify that inpatient services furnished can reasonably be expected to improve the patient's condition.   Kei Langhorst NP-C 12/30/20151:57 PM

## 2014-10-22 LAB — LIPID PANEL
Cholesterol: 160 mg/dL (ref 0–200)
HDL: 40 mg/dL (ref >39)
LDL Cholesterol: 109 mg/dL — ABNORMAL HIGH (ref 0–99)
Total CHOL/HDL Ratio: 4 RATIO
Triglycerides: 56 mg/dL (ref <150)
VLDL: 11 mg/dL (ref 0–40)

## 2014-10-22 LAB — HEMOGLOBIN A1C
Hgb A1c MFr Bld: 5.4 % (ref <5.7)
Mean Plasma Glucose: 108 mg/dL (ref <117)

## 2014-10-22 LAB — TSH: TSH: 1.675 u[IU]/mL (ref 0.350–4.500)

## 2014-10-22 NOTE — Progress Notes (Signed)
Pt attended wrap-up group. Pt stated that his day was fine and he had a good visit with his parents. 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

## 2014-10-22 NOTE — Progress Notes (Signed)
D: Patient denies SI/HI and A/V hallucinations;   A: Monitored q 15 minutes; patient encouraged to attend groups; patient educated about medications; patient given medications per physician orders; patient encouraged to express feelings and/or concerns  R: Patient has been able to focus and engage with staff without being disorganized and tangential; patient is cooperative and engaging during groups; patient was able to set goal to talk with staff 1:1 when having feelings of SI; patient is taking medications as prescribed and tolerating medications

## 2014-10-22 NOTE — Progress Notes (Signed)
[facility] MD Progress Note  10/22/2014 12:20 PM Nicholas Johnson.  MRN:  [id number] Subjective: Patient states "I am fine".  Objective: Patient seen and chart reviewed.I have discussed patient with treatment team. Patient presented with disorganized behavior of recent onset. Patient with no past psychiatric history,denies abusing any substances. Patient today continues to be disorganized ,tangential,paranoid. Patient denies any SI/HI/VH.Patient continues to be delusional and reports AH of hearing God's voice. Per staff patient seen scribbling on multiple papers all scattered across his bed and appears to be very suspicious .   Per collateral information obtained from mother per CSW documentation -10/22/14  His mother reports that pt called her on Xmas stating that he was confused and thought he did something to displease God. Pt's mother reports that pt has "always been very spiritual and religious" but reported that this was out of the ordinary for him. Pt's mother went on to say that pt was hearing demons and feeling things crawl on his skin and told her that he did something to change his relationship with God but did not understand what he did. His mother is encouraging pt to continue taking meds and was relieved to hear that pt told CSW he wanted to be referred to therapist (preferably male, Marketing executive, with 12-13 years of experience).    Diagnosis:   DSM5: Primary Psychiatric Diagnosis: Unspecified Schizophrenia spectrum and other psychotic disorder      Non Psychiatric Diagnosis: See PMH   Total Time spent with patient: 30 minutes   ADL's:  Intact  Sleep: Fair  Appetite:  Fair   Psychiatric Specialty Exam: Physical Exam  ROS  Blood pressure 105/74, pulse 83, temperature 98.2 F (36.8 C), temperature source Oral, resp. rate 16, height 5' 7.5" (1.715 m), weight 53.071 kg (117 lb), SpO2 100 %.Body mass index is 18.04 kg/(m^2).  General Appearance: Fairly Groomed   Engineer, water::  Minimal  Speech:  Normal Rate  Volume:  Normal  Mood:  Dysphoric  Affect:  Inappropriate and Labile  Thought Process:  Disorganized and Irrelevant  Orientation:  Full (Time, Place, and Person)  Thought Content:  Delusions, Hallucinations: Auditory and Paranoid Ideation  Suicidal Thoughts:  No  Homicidal Thoughts:  No  Memory:  Immediate;   Fair Recent;   Fair Remote;   Fair  Judgement:  Impaired  Insight:  Lacking  Psychomotor Activity:  Normal  Concentration:  Fair  Recall:  [place]: Fair  Akathisia:  No  Handed:  Right  AIMS (if indicated):     Assets:  [place] Talents/Skills  Sleep:  Number of Hours: 4   Musculoskeletal: Strength & Muscle Tone: within normal limits Gait & Station: normal Patient leans: N/A  Current Medications: Current Facility-Administered Medications  Medication Dose Route Frequency Provider Last Rate Last Dose  . acetaminophen (TYLENOL) tablet 650 mg  650 mg Oral Q6H PRN Delfin Gant, NP      . alum & mag hydroxide-simeth (MAALOX/MYLANTA) 200-200-20 MG/5ML suspension 30 mL  30 mL Oral Q4H PRN Delfin Gant, NP      . benztropine (COGENTIN) tablet 0.5 mg  0.5 mg Oral BID Elmarie Shiley, NP   0.5 mg at 10/22/14 3354  . haloperidol (HALDOL) tablet 5 mg  5 mg Oral BID Elmarie Shiley, NP   5 mg at 10/22/14 5625  . hydrOXYzine (ATARAX/VISTARIL) tablet 25 mg  25 mg Oral TID PRN Ursula Alert, MD      .  magnesium hydroxide (MILK OF MAGNESIA) suspension 30 mL  30 mL Oral Daily PRN Delfin Gant, NP      . OLANZapine (ZYPREXA) injection 5 mg  5 mg Intramuscular Q6H PRN Leandria Thier, MD      . OLANZapine zydis (ZYPREXA) disintegrating tablet 5 mg  5 mg Oral Q6H PRN Ursula Alert, MD      . traZODone (DESYREL) tablet 50 mg  50 mg Oral QHS Ursula Alert, MD   50 mg at 10/21/14 2200    Lab Results:  Results for orders placed or performed during the hospital encounter  of 10/20/14 (from the past 48 hour(s))  CBC     Status: None   Collection Time: 10/20/14  2:15 PM  Result Value Ref Range   WBC 7.1 4.0 - 10.5 K/uL   RBC 5.36 4.22 - 5.81 MIL/uL   Hemoglobin 15.5 13.0 - 17.0 g/dL   HCT 44.0 39.0 - 52.0 %   MCV 82.1 78.0 - 100.0 fL   MCH 28.9 26.0 - 34.0 pg   MCHC 35.2 30.0 - 36.0 g/dL   RDW 12.1 11.5 - 15.5 %   Platelets 164 150 - 400 K/uL  Comprehensive metabolic panel     Status: Abnormal   Collection Time: 10/20/14  2:15 PM  Result Value Ref Range   Sodium 140 135 - 145 mmol/L    Comment: Please note change in reference range.   Potassium 3.9 3.5 - 5.1 mmol/L    Comment: Please note change in reference range.   Chloride 104 96 - 112 mEq/L   CO2 29 19 - 32 mmol/L   Glucose, Bld 102 (H) 70 - 99 mg/dL   BUN 10 6 - 23 mg/dL   Creatinine, Ser 0.72 0.50 - 1.35 mg/dL   Calcium 9.8 8.4 - 10.5 mg/dL   Total Protein 8.6 (H) 6.0 - 8.3 g/dL   Albumin 5.6 (H) 3.5 - 5.2 g/dL   AST 22 0 - 37 U/L   ALT 12 0 - 53 U/L   Alkaline Phosphatase 52 39 - 117 U/L   Total Bilirubin 0.6 0.3 - 1.2 mg/dL   GFR calc non Af Amer >90 >90 mL/min   GFR calc Af Amer >90 >90 mL/min    Comment: (NOTE) The eGFR has been calculated using the CKD EPI equation. This calculation has not been validated in all clinical situations. eGFR's persistently <90 mL/min signify possible Chronic Kidney Disease.    Anion gap 7 5 - 15  Ethanol     Status: None   Collection Time: 10/20/14  2:15 PM  Result Value Ref Range   Alcohol, Ethyl (B) <5 0 - 9 mg/dL    Comment:        LOWEST DETECTABLE LIMIT FOR SERUM ALCOHOL IS 11 mg/dL FOR MEDICAL PURPOSES ONLY   Acetaminophen level     Status: Abnormal   Collection Time: 10/20/14  2:15 PM  Result Value Ref Range   Acetaminophen (Tylenol), Serum <10.0 (L) 10 - 30 ug/mL    Comment:        THERAPEUTIC CONCENTRATIONS VARY SIGNIFICANTLY. A RANGE OF 10-30 ug/mL MAY BE AN EFFECTIVE CONCENTRATION FOR MANY PATIENTS. HOWEVER, SOME ARE BEST  TREATED AT CONCENTRATIONS OUTSIDE THIS RANGE. ACETAMINOPHEN CONCENTRATIONS >150 ug/mL AT 4 HOURS AFTER INGESTION AND >50 ug/mL AT 12 HOURS AFTER INGESTION ARE OFTEN ASSOCIATED WITH TOXIC REACTIONS.   Salicylate level     Status: None   Collection Time: 10/20/14  2:15 PM  Result Value  Ref Range   Salicylate Lvl <8.0 2.8 - 20.0 mg/dL  Urine rapid drug screen (hosp performed)     Status: None   Collection Time: 10/20/14  5:05 PM  Result Value Ref Range   Opiates NONE DETECTED NONE DETECTED   Cocaine NONE DETECTED NONE DETECTED   Benzodiazepines NONE DETECTED NONE DETECTED   Amphetamines NONE DETECTED NONE DETECTED   Tetrahydrocannabinol NONE DETECTED NONE DETECTED   Barbiturates NONE DETECTED NONE DETECTED    Comment:        DRUG SCREEN FOR MEDICAL PURPOSES ONLY.  IF CONFIRMATION IS NEEDED FOR ANY PURPOSE, NOTIFY LAB WITHIN 5 DAYS.        LOWEST DETECTABLE LIMITS FOR URINE DRUG SCREEN Drug Class       Cutoff (ng/mL) Amphetamine      1000 Barbiturate      200 Benzodiazepine   998 Tricyclics       338 Opiates          300 Cocaine          300 THC              50     Physical Findings: AIMS: Facial and Oral Movements Muscles of Facial Expression: None, normal Lips and Perioral Area: None, normal Jaw: None, normal Tongue: None, normal,Extremity Movements Upper (arms, wrists, hands, fingers): None, normal Lower (legs, knees, ankles, toes): None, normal, Trunk Movements Neck, shoulders, hips: None, normal, Overall Severity Severity of abnormal movements (highest score from questions above): None, normal Incapacitation due to abnormal movements: None, normal Patient's awareness of abnormal movements (rate only patient's report): No Awareness, Dental Status Current problems with teeth and/or dentures?: No Does patient usually wear dentures?: No  CIWA:    COWS:     Treatment Plan Summary: Daily contact with patient to assess and evaluate symptoms and progress in  treatment Medication management  Plan:  Will continue Haldol 5 mg po bid for psychosis. Will continue Cogentin 0.5 mg po bid for EPS. Will continue Trazodone 50 mg po qhs for sleep. Will continue Vistaril prn for anxiety and Zyprexa zydis prn for agitation. Collateral information obtained from Mother - see above. CSW will work on disposition.   Medical Decision Making Problem Points:  Established problem, stable/improving (1), New problem, with no additional work-up planned (3), Review of last therapy session (1) and Review of psycho-social stressors (1) Data Points:  Order Aims Assessment (2) Review or order medicine tests (1) Review and summation of old records (2) Review of medication regiment & side effects (2) Review of new medications or change in dosage (2)  I certify that inpatient services furnished can reasonably be expected to improve the patient's condition.   Kelechi Astarita MD 10/22/2014, 12:20 PM

## 2014-10-22 NOTE — [facility] Group Notes (Signed)
[facility]  10/22/2014 , 1:40 PM    Type of Therapy:  [facility] Presentation  Participation Level:  Active  Participation Quality:  Attentive  Affect:  Blunted  Cognitive:  Oriented  Insight:  Limited  Engagement in Therapy:  Engaged  Modes of Intervention:  Discussion, Education and Socialization  Summary of Progress/Problems:  Onalee HuaDavid from [facility] came to present his recovery story and play the guitar.  Stayed the entire time.  Sat quietly throughout.  Asked no questions.  Daryel Geraldorth, Rodney B 10/22/2014 , 1:40 PM

## 2014-10-22 NOTE — [facility] Suicide Risk Assessment (Signed)
[facility] INPATIENT:  Family/Significant Other Suicide Prevention Education  Suicide Prevention Education:  Education Completed; Nicholas Johnson (pt's mother) [phone] has been identified by the patient as the family member/significant other with whom the patient will be residing, and identified as the person(s) who will aid the patient in the event of a mental health crisis (suicidal ideations/suicide attempt).  With written consent from the patient, the family member/significant other has been provided the following suicide prevention education, prior to the and/or following the discharge of the patient.  The suicide prevention education provided includes the following:  Suicide risk factors  Suicide prevention and interventions  National Suicide Hotline telephone number  [facility] assessment telephone number  [facility]  [facility] and/or [facility] telephone number  Request made of family/significant other to:  Remove weapons (e.g., guns, rifles, knives), all items previously/currently identified as safety concern.    Remove drugs/medications (over-the-counter, prescriptions, illicit drugs), all items previously/currently identified as a safety concern.  The family member/significant other verbalizes understanding of the suicide prevention education information provided.  The family member/significant other agrees to remove the items of safety concern listed above.  Nicholas Johnson, Nicholas Johnson LCSWA  10/22/2014, 10:15 AM

## 2014-10-22 NOTE — [facility] Group Notes (Signed)
The focus of this group is to educate the patient on the purpose and policies of crisis stabilization and provide a format to answer questions about their admission.  The group details unit policies and expectations of patients while admitted. Patient did not attend this group. 

## 2014-10-22 NOTE — Clinical Social Work Note (Signed)
CSW spoke with pt's mother at length regarding events that led to pt's hospitalization. His mother reports that pt called her on Xmas stating that he was confused and thought he did something to displease God. Pt's mother reports that pt has "always been very spiritual and religious" but reported that this was out of the ordinary for him. Pt's mother went on to say that pt was hearing demons and feeling things crawl on his skin and told her that he did something to change his relationship with God but did not understand what he did. His mother is encouraging pt to continue taking meds and was relieved to hear that pt told CSW he wanted to be referred to therapist (preferably male, Librarian, academicChristian counselor, with 12-13 years of experience). Pt's mother requested that CSW call her back with update on Monday.  The Sherwin-WilliamsHeather Smart, LCSWA 10/22/2014 10:28 AM

## 2014-10-23 DIAGNOSIS — F23 Brief psychotic disorder: Principal | ICD-10-CM

## 2014-10-23 MED ORDER — MIRTAZAPINE 7.5 MG PO TABS
7.5000 mg | ORAL_TABLET | Freq: Every day | ORAL | Status: DC
  Filled 2014-10-23 (×2): qty 1

## 2014-10-23 NOTE — Progress Notes (Signed)
[facility] MD Progress Note  10/23/2014 1:04 PM Nicholas Johnson.  MRN:  [id number] Subjective: Patient states "I am fine".  Objective: Patient seen and chart reviewed.I have discussed patient with treatment team. Patient presented with disorganized behavior of recent onset. Patient with no past psychiatric history,denies abusing any substances. Patient today appears to be more linear ,denies any new concerns. 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    Per collateral information obtained from mother -10/23/14 Per mother patient was withdrawn to his room ,without going out or coming out of his room since 10/16/14. Patient reported wanting to "get things right with God.' Per mother patient would not eat unless she brought him food. Patient also reported that "he did not want to hurt anyone". Pt's mother went on to say that pt was hearing demons and feeling things crawl on his skin . 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 This current episode started soon after this incident .Patient is hyper religious at baseline. Patient otherwise had a good childhood ,has some relational struggles with his father (nothing out of the ordinary per mother). Patient is doing Psychiatrist course. Patient does not abuse any drugs according to mother.    Diagnosis:   DSM5: Primary Psychiatric Diagnosis: Brief psychotic disorder R/O Adjustment disorder with anxiety      Non Psychiatric Diagnosis: See PMH   Total Time spent with patient: 30 minutes   ADL's:  Intact  Sleep: Fair  Appetite:  Fair   Psychiatric Specialty Exam: Physical  Exam  ROS  Blood pressure 102/68, pulse 89, temperature 97.6 F (36.4 C), temperature source Oral, resp. rate 16, height 5' 7.5" (1.715 m), weight 53.071 kg (117 lb), SpO2 100 %.Body mass index is 18.04 kg/(m^2).  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  Suicidal Thoughts:  No  Homicidal Thoughts:  No  Memory:  Immediate;   Fair Recent;   Fair Remote;   Fair  Judgement:  Fair  Insight:  Fair  Psychomotor Activity:  Normal  Concentration:  Fair  Recall:  [employer] of Knowledge:Fair  Language: Fair  Akathisia:  No  Handed:  Right  AIMS (if indicated):   0 (10/23/14)  Assets:  Housing Physical Health Social Support Talents/Skills  Sleep:  Number of Hours: 5.5   Musculoskeletal: Strength & Muscle Tone: within normal limits Gait & Station: normal Patient leans: N/A  Current Medications: Current Facility-Administered Medications  Medication Dose Route Frequency Provider Last Rate Last Dose  . acetaminophen (TYLENOL) tablet 650 mg  650 mg Oral Q6H PRN Earney Navy, NP      . alum & mag hydroxide-simeth (MAALOX/MYLANTA) 200-200-20 MG/5ML suspension 30 mL  30 mL Oral Q4H PRN Earney Navy, NP      .  benztropine (COGENTIN) tablet 0.5 mg  0.5 mg Oral BID Fransisca Kaufmann, NP   0.5 mg at 10/23/14 0818  . haloperidol (HALDOL) tablet 5 mg  5 mg Oral BID Fransisca Kaufmann, NP   5 mg at 10/23/14 0818  . hydrOXYzine (ATARAX/VISTARIL) tablet 25 mg  25 mg Oral TID PRN Jomarie Longs, MD      . magnesium hydroxide (MILK OF MAGNESIA) suspension 30 mL  30 mL Oral Daily PRN Earney Navy, NP      . mirtazapine (REMERON) tablet 7.5 mg  7.5 mg Oral QHS Holger Sokolowski, MD       . OLANZapine (ZYPREXA) injection 5 mg  5 mg Intramuscular Q6H PRN Jomarie Longs, MD      . OLANZapine zydis (ZYPREXA) disintegrating tablet 5 mg  5 mg Oral Q6H PRN Jomarie Longs, MD        Lab Results:  Results for orders placed or performed during the hospital encounter of 10/20/14 (from the past 48 hour(s))  TSH     Status: None   Collection Time: 10/22/14  6:25 AM  Result Value Ref Range   TSH 1.675 0.350 - 4.500 uIU/mL    Comment: Performed at [facility]  Lipid panel     Status: Abnormal   Collection Time: 10/22/14  6:25 AM  Result Value Ref Range   Cholesterol 160 0 - 200 mg/dL   Triglycerides 56 <045 mg/dL   HDL 40 >40 mg/dL   Total CHOL/HDL Ratio 4.0 RATIO   VLDL 11 0 - 40 mg/dL   LDL Cholesterol 981 (H) 0 - 99 mg/dL    Comment:        Total Cholesterol/HDL:CHD Risk Coronary Heart Disease Risk Table                     Men   Women  1/2 Average Risk   3.4   3.3  Average Risk       5.0   4.4  2 X Average Risk   9.6   7.1  3 X Average Risk  23.4   11.0        Use the calculated Patient Ratio above and the CHD Risk Table to determine the patient's CHD Risk.        ATP III CLASSIFICATION (LDL):  <100     mg/dL   Optimal  191-478  mg/dL   Near or Above                    Optimal  130-159  mg/dL   Borderline  295-621  mg/dL   High  >308     mg/dL   Very High Performed at [facility]   Hemoglobin A1c     Status: None   Collection Time: 10/22/14  6:25 AM  Result Value Ref Range   Hgb A1c MFr Bld 5.4 <5.7 %    Comment: (NOTE)                                                                       According to the ADA Clinical Practice Recommendations for 2011, when HbA1c is used as a screening test:  >=6.5%   Diagnostic of Diabetes Mellitus           (  if abnormal result is confirmed) 5.7-6.4%   Increased risk of developing Diabetes Mellitus References:Diagnosis and Classification of Diabetes Mellitus,Diabetes Care,2011,34(Suppl 1):S62-S69 and  Standards of Medical Care in         Diabetes - 2011,Diabetes Care,2011,34 (Suppl 1):S11-S61.    Mean Plasma Glucose 108 <117 mg/dL    Comment: Performed at [facility]    Physical Findings: AIMS: Facial and Oral Movements Muscles of Facial Expression: None, normal Lips and Perioral Area: None, normal Jaw: None, normal Tongue: None, normal,Extremity Movements Upper (arms, wrists, hands, fingers): None, normal Lower (legs, knees, ankles, toes): None, normal, Trunk Movements Neck, shoulders, hips: None, normal, Johnson Severity Severity of abnormal movements (highest score from questions above): None, normal Incapacitation due to abnormal movements: None, normal Patient's awareness of abnormal movements (rate only patient's report): No Awareness, Dental Status Current problems with teeth and/or dentures?: No Does patient usually wear dentures?: No  CIWA:    COWS:     Treatment Plan Summary: Daily contact with patient to assess and evaluate symptoms and progress in treatment Medication management  Plan:  Will continue Haldol 5 mg po bid for psychosis. AIMS -0 (10/23/14) Will continue Cogentin 0.5 mg po bid for EPS. Will discontinue Trazodone and start a trial of Remeron 7.5 mg po qhs for sleep as well as anxiety symptoms. Will continue Vistaril prn for anxiety and Zyprexa zydis prn for agitation. Collateral information obtained from Mother - see above. CSW will work on disposition.   Medical Decision Making Problem Points:  Established problem, stable/improving (1), New problem, with no additional work-up planned (3), Review of last therapy session (1) and Review of psycho-social stressors (1) Data Points:  Review or order medicine tests (1) Review and summation of old records (2) Review of medication regiment & side effects (2) Review of new medications or change in dosage (2)  I certify that inpatient services furnished can reasonably be expected to improve the  patient's condition.   Heru Montz MD 10/23/2014, 1:04 PM

## 2014-10-23 NOTE — [facility] Group Notes (Signed)
[facility] LCSW Aftercare Discharge Planning Group Note   10/23/2014 10:27 AM  Participation Quality:  Appropriate   Mood/Affect:  Blunted  Depression Rating:  0  Anxiety Rating:  0  Thoughts of Suicide:  No Will you contract for safety?   NA  Current AVH:  No  Plan for Discharge/Comments:  Pt reports that he is feeling good today and gained some clarity regarding the events that led to his hospitalization. Pt reports that he is looking for therapist and psychiatrist-wants a therapist that is male and christian if possible. CSW assessing. Pt plans to return to his apt at d/c and will need letter for work.   Transportation Means: parent/mom   Supports: family supports identified/mom is primary Field seismologist, Oncologist

## 2014-10-23 NOTE — Progress Notes (Signed)
Patient ID: [name], male   DOB: 1991/07/04, 24 y.o.   MRN: [id number] D. Patient presents with depressed mood, affect flat. Javi was guarded on approach but he reports he is '' doing well'' he was not very forthcoming on writer, stating '' oh just came in to get some rest and see if I was safe my family was worried about me. '' patient denies any SI/HI. He did report he is no longer hearing voices, but states '' I do hear the voice of God sometimes, but not right now. '' he was noted to have two bibles in bed and several pages of scriptures in bed. He has been isolative to room, in no acute distress. A. Medications given as ordered . Support and encouragement provided. Pt did not complete self inventory despite encouragement from Clinical research associate. Discussed above with Dr. Elna Breslow. R. Patient is safe. Will continue to monitor q 15 minutes for safety.

## 2014-10-24 DIAGNOSIS — F4322 Adjustment disorder with anxiety: Secondary | ICD-10-CM

## 2014-10-24 MED ORDER — DIPHENHYDRAMINE HCL 50 MG/ML IJ SOLN
50.0000 mg | Freq: Once | INTRAMUSCULAR | Status: DC
  Filled 2014-10-24: qty 1

## 2014-10-24 MED ORDER — DIPHENHYDRAMINE HCL 50 MG/ML IJ SOLN
INTRAMUSCULAR | Status: AC
  Filled 2014-10-24: qty 1

## 2014-10-24 MED ORDER — MIRTAZAPINE 15 MG PO TABS
15.0000 mg | ORAL_TABLET | Freq: Every day | ORAL | Status: DC
  Filled 2014-10-24 (×2): qty 1
  Filled 2014-10-24: qty 14
  Filled 2014-10-24: qty 1

## 2014-10-24 NOTE — Progress Notes (Signed)
Pt has labile affect and mood.  Pt's minister came to visit pt.  After minister left, pt reported he felt like "my throat is closing up."  Pt initially would only speak a little at a time, but was then speaking normally.  Vitals obtained.  BP: 136/90, T: 97.8, pulse: 86, O2: 100%, R: 20.  Pt was informed that his vitals were stable.  Pt then demanded to go to the emergency room.  On-call provider notified and Benadryl  IMX1 ordered.  Pt educated on medication and importance of compliance.  Pt refused to take medication, stating "I don't want any medication.  I'm just going to lay down and try to relax.  If I feel like I need it, I'll come back up and let you know."  Pt appears to be in no distress.  On-call provider notified and no further orders given.  Will continue to monitor and assess for safety.

## 2014-10-24 NOTE — Progress Notes (Signed)
D: Pt has had labile affect and mood.  Pt reports his goal today was "to relax and try to focus."  Pt denies SI/HI, denies hallucinations, denies pain.   A: Offered scheduled HS medication.  Met with pt 1:1 and offered support and encouragement.  Safety maintained.   R: Pt refused scheduled HS medications.  He verbally contracts for safety.  Pt reports he will notify writer of needs and concerns.  Will continue to monitor and assess for safety.

## 2014-10-24 NOTE — Progress Notes (Signed)
Adult Psychoeducational Group Note  Date:  10/24/2014 Time: 09:35  Group Topic/Focus:  Orientation:   The focus of this group is to educate the patient on the purpose and policies of crisis stabilization and provide a format to answer questions about their admission.  The group details unit policies and expectations of patients while admitted.  Participation Level:  Did Not Attend  Participation Quality: n/a  Affect: n/a  Cognitive: n/a  Insight: n/a  Engagement in Group: n/a  Modes of Intervention:  Discussion, Education, Orientation and Support  Additional Comments:  Pt did not attend group. Pt in bed asleep.   Aurora Mask 10/24/2014, 11:22 AM

## 2014-10-24 NOTE — Progress Notes (Signed)
[facility] MD Progress Note  10/24/2014 12:29 PM Nicholas Johnson.  MRN:  [id number] Subjective: Patient states "I am fine".  Objective: Patient seen and chart reviewed.I have discussed patient with treatment team. Patient presented with disorganized behavior of recent onset. Patient with no past psychiatric history,denies abusing any substances. Patient today appears to be less anxious ,but with some derailment of thoughts ,but improving since admission. Patient denies any SI/HI/VH. Reports did not sleep well last night since he watched TV for a long time.   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 Patient was very active in church but was asked to take a break by his pastor recently ,this happened because patient wanted to make some changes in the church and communicated this with his pastor who did not like it.      Diagnosis:   DSM5: Primary Psychiatric Diagnosis: Brief psychotic disorder  Adjustment disorder with anxiety      Non Psychiatric Diagnosis: See PMH   Total Time spent with patient: 30 minutes   ADL's:  Intact  Sleep: Fair  Appetite:  Fair   Psychiatric Specialty Exam: Physical Exam  ROS  Blood pressure 110/68, pulse 78, temperature 98.4 F (36.9 C), temperature source Tympanic, resp. rate 16, height 5' 7.5" (1.715 m), weight 53.071 kg (117 lb), SpO2 100 %.Body mass index is 18.04 kg/(m^2).  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  Suicidal Thoughts:  No  Homicidal Thoughts:  No  Memory:  Immediate;   Fair Recent;   Fair Remote;   Fair  Judgement:  Fair  Insight:  Fair  Psychomotor Activity:  Normal  Concentration:  Fair  Recall:  [employer] of Knowledge:Fair  Language: Fair  Akathisia:  No  Handed:  Right  AIMS (if indicated):   0 (10/23/14)  Assets:  Housing Physical Health Social Support Talents/Skills  Sleep:  Number of Hours: 6.75   Musculoskeletal: Strength & Muscle Tone: within normal limits Gait & Station: normal Patient leans: N/A  Current Medications: Current Facility-Administered Medications  Medication Dose Route Frequency Provider Last Rate Last Dose  . acetaminophen (TYLENOL) tablet 650 mg  650 mg Oral Q6H PRN Earney Navy, NP      . alum & mag hydroxide-simeth (MAALOX/MYLANTA) 200-200-20 MG/5ML suspension 30 mL  30 mL Oral Q4H PRN Earney Navy, NP      . benztropine (COGENTIN) tablet 0.5 mg  0.5 mg Oral BID Fransisca Kaufmann, NP   0.5 mg at 10/24/14 4098  . haloperidol (HALDOL) tablet 5 mg  5 mg Oral BID Fransisca Kaufmann, NP   5 mg at 10/24/14 1191  . hydrOXYzine (ATARAX/VISTARIL) tablet 25 mg  25 mg Oral TID PRN Jomarie Longs, MD      . magnesium hydroxide (MILK OF MAGNESIA) suspension 30 mL  30 mL Oral Daily PRN Earney Navy, NP      .  mirtazapine (REMERON) tablet 15 mg  15 mg Oral QHS Elim Peale, MD      . OLANZapine (ZYPREXA) injection 5 mg  5 mg Intramuscular Q6H PRN Abagayle Klutts, MD      . OLANZapine zydis (ZYPREXA) disintegrating tablet 5 mg  5 mg Oral Q6H PRN Jomarie Longs, MD        Lab Results:  No results found for this or any previous visit (from the past 48 hour(s)).  Physical Findings: AIMS: Facial and Oral Movements Muscles of Facial Expression: None, normal Lips and Perioral Area: None, normal Jaw: None, normal Tongue: None, normal,Extremity Movements Upper (arms,  wrists, hands, fingers): None, normal Lower (legs, knees, ankles, toes): None, normal, Trunk Movements Neck, shoulders, hips: None, normal, Johnson Severity Severity of abnormal movements (highest score from questions above): None, normal Incapacitation due to abnormal movements: None, normal Patient's awareness of abnormal movements (rate only patient's report): No Awareness, Dental Status Current problems with teeth and/or dentures?: No Does patient usually wear dentures?: No  CIWA:    COWS:     Treatment Plan Summary: Daily contact with patient to assess and evaluate symptoms and progress in treatment Medication management  Per collateral information obtained from mother -10/23/14 Per mother patient was withdrawn to his room ,without going out or coming out of his room since 10/16/14. Patient reported wanting to "get things right with God.' Per mother patient would not eat unless she brought him food. Patient also reported that "he did not want to hurt anyone". Pt's mother went on to say that pt was hearing demons and feeling things crawl on his skin . 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 This current episode started soon after this incident .Patient is hyper religious at baseline. Patient otherwise had a good childhood ,has some relational struggles with his father (nothing out of the ordinary per mother). Patient is doing Psychiatrist course. Patient does not abuse any drugs according to mother.      Plan:  Will continue Haldol 5 mg po bid for psychosis. AIMS -0 (10/23/14) Will continue Cogentin 0.5 mg po bid for EPS. Will increase Remeron to 15 mg po qhs for sleep as well as anxiety symptoms. Discussed sleep hygiene techniques like scheduled bed time ,not watching TV prior to bedtime ,limiting  caffeine intake during evening hours and so on. Will continue Vistaril prn for anxiety and Zyprexa zydis prn for agitation. Collateral information obtained from Mother - see above. CSW will work on disposition.   Medical Decision Making Problem Points:  Established problem, stable/improving (1), New problem, with no additional work-up planned (3), Review of last therapy session (1) and Review of psycho-social stressors (1) Data Points:  Review or order medicine tests (1) Review and summation of old records (2) Review of medication regiment & side effects (2) Review of new medications or change in dosage (2)  I certify that inpatient services furnished can reasonably be expected to improve the patient's condition.   Jovi Alvizo MD 10/24/2014, 12:29 PM

## 2014-10-24 NOTE — Progress Notes (Signed)
D   Pt is pleasant on approach  His thinking is still preoccupied with religious undertones   He was in the dayroom interacting with others and does not appear fearful or guarded  He did say he prefers to talk to one doctor instead of several and he talked about possibly being discharged Monday  He also said he did not want a sleeping pill tonight A   Verbal support given   Medications offered and educated on same   Q 15 min checks R   Pt safe at present

## 2014-10-24 NOTE — [facility] Group Notes (Signed)
[facility] LCSW Group Therapy 10/24/2014 10:00am  Type of Therapy and Topic: Group Therapy: Avoiding Self-Sabotaging and Enabling Behaviors   Participation Level: Active  Description of Group:  Learn how to identify obstacles, self-sabotaging and enabling behaviors, what are they, why do we do them and what needs do these behaviors meet? Discuss unhealthy relationships and how to have positive healthy boundaries with those that sabotage and enable. Explore aspects of self-sabotage and enabling in yourself and how to limit these self-destructive behaviors in everyday life. A scaling question is used to help patient look at where they are now in their motivation to change, from 1 to 10 (lowest to highest motivation).   Therapeutic Goals:  1. Patient will identify one obstacle that relates to self-sabotage and enabling behaviors 2. Patient will identify one personal self-sabotaging or enabling behavior they did prior to admission 3. Patient able to establish a plan to change the above identified behavior they did prior to admission:  4. Patient will demonstrate ability to communicate their needs through discussion and/or role plays.  Summary of Patient Progress:  Pt participated actively in group discussion, responding willingly to discussion prompts. Pt exhibits developing insight AEB his ability to identify self-sabotaging behaviors, the negative effects and reasoning for the behaviors, as well as strategies to changing these behaviors. Pt identified his failure to find healthy ways to cope with negative life circumstances as a self-sabotaging behavior. He also reported that he would like to learn to communicate in better ways as he feels this is another stressor to his circumstances.   Therapeutic Modalities:  Cognitive Behavioral Therapy  Person-Centered Therapy  Motivational Interviewing   Chad Cordial, LCSWA 10/24/2014 11:50 AM

## 2014-10-24 NOTE — Progress Notes (Signed)
Patient ID: Nicholas Overall., male   DOB: [birth year], 24 y.o.   MRN: [id number] Called by Apolinar Junes RN for pt who was visited by his minister this pm and after visit pt approached nursing c/o throat closing up and requesting visit to ED   were WNL  And pt refused repeat vital signs/refused to talk further. O Pt is on haldol ;cogentin and remeron (has been refusing Remeron per nurse) admitted for psychosis A-Anxiety attack-?visitor related P-Benadryl 50 mg IM x 1. FU PRN and on day shift  Called by nurse-pt refused shot for now-told nurse he was going to go lie down and try to relax but would return for med if needed

## 2014-10-24 NOTE — Plan of Care (Signed)
Problem: Alteration in thought process Goal: LTG-Patient has not harmed self or others in at least 2 days Outcome: Adequate for Discharge Pt has remained free from self harm and denies homicidal ideation Goal: LTG-Patient behavior demonstrates decreased signs psychosis Pt reports no SI/HI/AVH. Continues to present with bizarre affect, suspicious, animated at times. Pt reports continued confusion regarding what brought him to [facility] and feels "disturbed" by this confusion. By d/c, pt will present as clear, organized, and oriented. Goal not met. [place], [place] 10/21/2014 12:21 PM  Outcome: Progressing Pt verbalizations are more logical and coherent Goal: LTG-Patient verbalizes understanding importance med regimen (Patient verbalizes understanding of importance of medication regimen and need to continue outpatient care.)  Outcome: Progressing Pt takes prescribed medications Goal: STG-Patient is able to discuss thoughts with staff Outcome: Progressing Pt discusses thoughts in a logical manner Goal: STG-Patient does not respond to command hallucinations Outcome: Progressing Have not observed pt responding to internal stimuli

## 2014-10-24 NOTE — Progress Notes (Signed)
Adult Psychoeducational Group Note  Date:  10/24/2014 Time:  1325  Group Topic/Focus:  Coping With Mental Health Crisis:   The purpose of this group is to help patients identify strategies for coping with mental health crisis.  Group discusses possible causes of crisis and ways to manage them effectively.  Participation Level:  Minimal  Participation Quality:  Appropriate and Drowsy  Affect:  Flat  Cognitive:  Appropriate  Insight: Lacking  Engagement in Group:  Lacking  Modes of Intervention:  Confrontation, Discussion, Education and Support  Additional Comments:  Pt able to identify one positive aspect of self and one coping skill to utilize during times of stress.   Aurora Mask 10/24/2014, 3:38 PM

## 2014-10-24 NOTE — Progress Notes (Addendum)
Patient ID: Nicholas Johnson., male   DOB: 10-Dec-1990, 24 y.o.   MRN: [id number]  Pt currently presents with a blunted affect and depressed behavior. Per self inventory, pt rates depression at a 0, hopelessness 0-1 and anxiety 2. Pt's daily goal is to "myself; remembering how I've felt, communications and repentance" and they intend to do so by " get help; encouragement, searching scripture." Pt reports good sleep, concentration and appetite.  Pt provided with medications per providers orders. Pt's labs and vitals were monitored throughout the day. Pt supported emotionally and encouraged to express concerns and questions. Pt consulted with Clinical research associate and provider. Pt educated on coping skills and medications. Pt given Gatorade for lower bp.  Pt's safety ensured with 15 minute and environmental checks. Pt currently denies SI/HI and A/V hallucinations. Pt verbally agrees to seek staff if SI/HI or A/VH occurs and to consult with staff before acting on these thoughts.

## 2014-10-24 NOTE — Progress Notes (Signed)
Adult Psychoeducational Group Note  Date:  10/24/2014 Time:  12:10 AM  Group Topic/Focus:  Wrap-Up Group:   The focus of this group is to help patients review their daily goal of treatment and discuss progress on daily workbooks.  Participation Level:  Active  Participation Quality:  Appropriate and Attentive  Affect:  Appropriate  Cognitive:  Appropriate  Insight: Appropriate  Engagement in Group:  Engaged  Modes of Intervention:  Discussion  Additional Comments:  Pt stated his day was relaxing. Pt stated tomorrow he wanted to work on his communication, being open, and processing how he feels.  Nicholas Johnson 10/24/2014, 12:10 AM

## 2014-10-24 NOTE — Plan of Care (Signed)
Problem: Ineffective individual coping Goal: STG: Patient will remain free from self harm Outcome: Progressing Pt has not harmed himself this shift.  He denies SI and verbally contracts for safety.

## 2014-10-25 NOTE — Progress Notes (Signed)
D: Pt has anxious affect and mood.  When pt was asked what his goal was today, pt stated "just kind of relaxing and try to stay focused a little bit at the same time and try to stay balanced.  Try to focus on learning."  Pt reports he had a good visit with his aunt, mother, and father.  Pt denies SI/HI, denies hallucinations, denies pain.  Pt interacts with staff and peers appropriately.   A: Offered scheduled night medication.  Met with pt 1:1 and offered support and encouragement.  Safety maintained.   R: Pt continues to refuse HS medication, stating "I don't need it."  Pt verbally contracts for safety.  He reports that he will notify writer of needs and concerns.  Will continue to monitor and assess for safety.

## 2014-10-25 NOTE — Progress Notes (Signed)
Pt is resting in his room with eyes closed.  Pt appears to be asleep.  Respirations are even and unlabored.  No distress noted.  Will continue to monitor and assess for safety.

## 2014-10-25 NOTE — Progress Notes (Signed)
[facility] MD Progress Note  10/25/2014 10:23 AM Nicholas Johnson.  MRN:  [id number] Subjective: Patient states "I am fine".  Objective: Patient seen and chart reviewed.I have discussed patient with treatment team. Patient presented with disorganized behavior of recent onset. Patient with no past psychiatric history,denies abusing any substances. Patient today appears to be less anxious, did not endorse any new concerns. Per staff patient had an incident when he felt like his throat was closing on him and wanted to go to ED. However later on he had symptom resolution and was able to sleep.  Patient per EHR has not been taking his Remeron at bedtime which was prescribed for anxiety symptoms. Patient denies any SI/HI/VH. Reports sleep as better last night.  Patient continues to report hearing God's voice ,but this is chronic. 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 Patient was very active in church but was asked to take a break by his pastor recently ,this happened because patient wanted to make some changes in the church and communicated this with his pastor who did not like it.      Diagnosis:   DSM5: Primary Psychiatric Diagnosis: Brief psychotic disorder  Adjustment disorder with anxiety      Non Psychiatric Diagnosis: See PMH   Total Time spent with patient: 30 minutes   ADL's:  Intact  Sleep: Fair  Appetite:  Fair   Psychiatric Specialty Exam: Physical Exam  ROS  Blood pressure 104/73, pulse 78, temperature 98 F (36.7 C), temperature source Oral, resp. rate 16, height 5' 7.5" (1.715 m), weight 53.071 kg (117 lb), SpO2 100 %.Body mass index is 18.04 kg/(m^2).  General Appearance: Fairly Groomed  Patent attorney::  Minimal  Speech:  Normal Rate  Volume:  Normal  Mood:  Euthymic  Affect:   Labile  Thought Process:  Linear  Orientation:  Full (Time, Place, and Person)  Thought Content: did not voice any delusions today ,more linear .Does hear voice of GOD ,however this is chronic and has been going on for a long time ,is a Optician, dispensing in training and is religiously preoccupied at baseline.  Suicidal Thoughts:  No  Homicidal Thoughts:  No  Memory:  Immediate;   Fair Recent;   Fair Remote;   Fair  Judgement:  Fair  Insight:  Fair  Psychomotor Activity:  Normal  Concentration:  Fair  Recall:  [employer] of Knowledge:Fair  Language: Fair  Akathisia:  No  Handed:  Right  AIMS (if indicated):   0 (10/23/14)  Assets:  Housing Physical Health Social Support Talents/Skills  Sleep:  Number of Hours: 6.75   Musculoskeletal: Strength & Muscle Tone: within normal limits Gait & Station: normal Patient leans: N/A  Current Medications: Current Facility-Administered Medications  Medication Dose Route Frequency Provider Last Rate Last Dose  . acetaminophen (TYLENOL) tablet 650 mg  650 mg Oral Q6H PRN Earney Navy, NP      . alum & mag hydroxide-simeth (MAALOX/MYLANTA) 200-200-20 MG/5ML suspension 30 mL  30 mL Oral Q4H PRN Earney Navy, NP      . benztropine (COGENTIN) tablet 0.5 mg  0.5 mg Oral BID Fransisca Kaufmann, NP   0.5 mg at 10/25/14 0800  . diphenhydrAMINE (BENADRYL) 50 MG/ML injection           . diphenhydrAMINE (BENADRYL)  injection 50 mg  50 mg Intramuscular Once Court Joy, PA-C   50 mg at 10/24/14 2329  . haloperidol (HALDOL) tablet 5 mg  5 mg Oral BID Fransisca Kaufmann, NP   5 mg at 10/25/14 0753  . hydrOXYzine (ATARAX/VISTARIL) tablet 25 mg  25 mg Oral TID PRN Jomarie Longs, MD      . magnesium hydroxide (MILK OF MAGNESIA) suspension 30 mL  30 mL Oral Daily PRN Earney Navy, NP      . mirtazapine (REMERON) tablet 15 mg  15 mg Oral QHS Jomarie Longs, MD   15 mg at 10/24/14 2259  . OLANZapine (ZYPREXA) injection 5 mg  5 mg Intramuscular Q6H PRN Emari Demmer, MD      . OLANZapine zydis (ZYPREXA) disintegrating tablet 5 mg  5 mg Oral Q6H PRN Jomarie Longs, MD        Lab Results:  No results found for this or any previous visit (from the past 48 hour(s)).  Physical Findings: AIMS: Facial and Oral Movements Muscles of Facial Expression: None, normal Lips and Perioral Area: None, normal Jaw: None, normal Tongue: None, normal,Extremity Movements Upper (arms, wrists, hands, fingers): None, normal Lower (legs, knees, ankles, toes): None, normal, Trunk Movements Neck, shoulders, hips: None, normal, Johnson Severity Severity of abnormal movements (highest score from questions above): None, normal Incapacitation due to abnormal movements: None, normal Patient's awareness of abnormal movements (rate only patient's report): No Awareness, Dental Status Current problems with teeth and/or dentures?: No Does patient usually wear dentures?: No  CIWA:    COWS:     Treatment Plan Summary: Daily contact with patient to assess and evaluate symptoms and progress in treatment Medication management  Per collateral information obtained from mother -10/23/14 Per mother patient was withdrawn to his room ,without going out or coming out of his room since 10/16/14. Patient reported wanting to "get things right with God.' Per mother patient would not eat unless she brought him food. Patient also reported that "he did not want to hurt anyone". Pt's mother went on to say that pt was hearing demons and feeling things crawl on his skin . 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 This current episode started soon after this incident .Patient is hyper religious at baseline. Patient otherwise had a good childhood ,has some relational struggles with his father (nothing out of the ordinary  per mother). Patient is doing Psychiatrist course. Patient does not abuse any drugs according to mother.      Plan:  Will continue Haldol 5 mg po bid for psychosis. AIMS -0 (10/23/14) Will continue Cogentin 0.5 mg po bid for EPS. Will continue Remeron  15 mg po qhs for sleep as well as anxiety symptoms. Discussed sleep hygiene techniques like scheduled bed time ,not watching TV prior to bedtime ,limiting caffeine intake during evening hours and so on. Will continue Vistaril prn for anxiety and Zyprexa zydis prn for agitation. Collateral information obtained from Mother - see above. CSW will work on disposition.   Medical Decision Making Problem Points:  Established problem, stable/improving (1), New problem, with no additional work-up planned (3), Review of last therapy session (1) and Review of psycho-social stressors (1) Data Points:  Review or order medicine tests (1) Review and summation of  old records (2) Review of medication regiment & side effects (2) Review of new medications or change in dosage (2)  I certify that inpatient services furnished can reasonably be expected to improve the patient's condition.   Zaire Levesque MD 10/25/2014, 10:23 AM

## 2014-10-25 NOTE — Progress Notes (Signed)
Adult Psychoeducational Group Note  Date:  10/25/2014 Time:  11:00 PM  Group Topic/Focus:  Wrap-Up Group:   The focus of this group is to help patients review their daily goal of treatment and discuss progress on daily workbooks.  Participation Level:  Active  Participation Quality:  Appropriate and Attentive  Affect:  Appropriate  Cognitive:  Appropriate  Insight: Appropriate  Engagement in Group:  Engaged  Modes of Intervention:  Discussion  Additional Comments:  Pt stated he had a good day today. It was very relaxing. Writer asked what he could add to his support system, pt stated availability. Pt stated he is working on staying focused on why he is here so that he can apply what he has learned to his life.  Nicholas Johnson 10/25/2014, 11:00 PM

## 2014-10-25 NOTE — [facility] Group Notes (Signed)
[facility] Group Notes:  (Nursing/MHT/Case Management/Adjunct)  Date:  10/25/2014  Time:  12:57 PM  Type of Therapy:  Psychoeducational Skills  Participation Level:  Did Not Attend  Participation Quality:  Did Not Attend  Affect:  Did Not Attend  Cognitive:  Did Not Attend  Insight:  None  Engagement in Group:  Did Not Attend  Modes of Intervention:  Did Not Attend  Summary of Progress/Problems: Pt did not attend patient healthy coping skills group.   Alonni Heimsoth Shanta 10/25/2014, 12:57 PM 

## 2014-10-25 NOTE — [facility] Group Notes (Signed)
[facility] Group Notes:  (Nursing/MHT/Case Management/Adjunct)  Date:  10/25/2014  Time:  12:51 PM  Type of Therapy:  Psychoeducational Skills  Participation Level:  Did Not Attend  Participation Quality:  Did Not Attend  Affect:  Did Not Attend  Cognitive:  Did Not Attend  Insight:  None  Engagement in Group:  Did Not Attend  Modes of Intervention:  Did Not Attend  Summary of Progress/Problems: Pt did not attend patient self inventory group.   Jacquelyne Balint Shanta 10/25/2014, 12:51 PM

## 2014-10-25 NOTE — Progress Notes (Signed)
D: Patient presents with anxious affect and mood. He reported on the self inventory sheet that he's sleeping well at night, low energy level and improving ability to pay attention. Patient rates depression "1" and feelings of hopelessness "2". Writer observed that he's been his room reading the bible and no participation in groups. He recently came to the nurses' station and had a conversation about school, voicing that he only has two semesters left and will be receiving a degree in visual arts. Patient adheres to medication regimen.  A: Support and encouragement provided to patient. Administered medications per ordering MD. Monitor Q15 minute checks for safety.  R: Patient receptive. Denies SI/HI and AVH. Patient remains safe on the unit.

## 2014-10-25 NOTE — Plan of Care (Signed)
Problem: Alteration in thought process Goal: STG-Patient is able to sleep at least 6 hours per night Outcome: Progressing According to flowsheet, pt slept 6.75 hours last night (10/24/13-10/25/13).

## 2014-10-25 NOTE — [facility] Group Notes (Signed)
[facility] LCSW Group Therapy  10/25/2014   11:00 AM   Type of Therapy:  Group Therapy  Participation Level:  Active  Participation Quality:  Appropriate and Attentive  Affect:  Appropriate, Calm   Cognitive:  Alert and Appropriate  Insight:  Developing/Improving and Engaged  Engagement in Therapy:  Developing/Improving and Engaged  Modes of Intervention:  Clarification, Confrontation, Discussion, Education, Exploration, Limit-setting, Orientation, Problem-solving, Rapport Building, Dance movement psychotherapist, Socialization and Support  Summary of Progress/Problems: The main focus of today's process group was to identify the patient's current support system and decide on other supports that can be put in place.  An emphasis was placed on using counselor, doctor, therapy groups, 12-step groups, and problem-specific support groups to expand supports, as well as doing something different than has been done before. Pt shared that his family, friends and church are supportive.  Pt was quiet but appeared to actively listen to group discussion.     Reyes Ivan, LCSW 10/25/2014 1:44 PM

## 2014-10-25 NOTE — Progress Notes (Signed)
Adult Psychoeducational Group Note  Date:  10/25/2014 Time:  12:51 AM  Group Topic/Focus:  Wrap-Up Group:   The focus of this group is to help patients review their daily goal of treatment and discuss progress on daily workbooks.  Participation Level:  Active  Participation Quality:  Appropriate  Affect:  Appropriate  Cognitive:  Appropriate  Insight: Appropriate  Engagement in Group:  Engaged  Modes of Intervention:  Discussion  Additional Comments:  Pt stated his day was pretty good today. It was very chill and relaxing. Pt stated tomorrow he wants to work on getting his Bible out to read.  Nicholas Johnson 10/25/2014, 12:51 AM

## 2014-10-26 MED ORDER — BENZTROPINE MESYLATE 0.5 MG PO TABS: 0.5000 mg | ORAL_TABLET | Freq: Two times a day (BID) | ORAL | Status: DC

## 2014-10-26 MED ORDER — IBUPROFEN 200 MG PO TABS
400.0000 mg | ORAL_TABLET | Freq: Four times a day (QID) | ORAL | Status: DC | PRN
Start: 2014-10-26 — End: 2018-05-17

## 2014-10-26 MED ORDER — MIRTAZAPINE 15 MG PO TABS: 15.0000 mg | ORAL_TABLET | Freq: Every day | ORAL | Status: DC

## 2014-10-26 MED ORDER — HYDROXYZINE HCL 25 MG PO TABS: 25.0000 mg | ORAL_TABLET | Freq: Three times a day (TID) | ORAL | Status: DC | PRN

## 2014-10-26 MED ORDER — HALOPERIDOL 5 MG PO TABS: 5.0000 mg | ORAL_TABLET | Freq: Two times a day (BID) | ORAL | Status: DC

## 2014-10-26 NOTE — Discharge Summary (Signed)
Physician Discharge Summary Note  Patient:  Nicholas Johnson. is an 24 y.o., male MRN:  [id number] DOB:  06/14/1991 Patient phone:  [phone] (home)  Patient address:   [address] [place] [address],  Total Time spent with patient: 30 minutes  Date of Admission:  10/20/2014 Date of Discharge: 10/26/2014  Reason for Admission:  Psychosis   Discharge Diagnoses:  Brief psychotic disorder Adjustment disorder with anxiety  Active Problems:   Psychosis   Psychiatric Specialty Exam: Physical Exam  Vitals reviewed. Psychiatric: His speech is normal and behavior is normal. Judgment and thought content normal. His mood appears anxious. Cognition and memory are normal.    Review of Systems  Constitutional: Negative.   HENT: Negative.   Eyes: Negative.   Respiratory: Negative.   Cardiovascular: Negative.   Gastrointestinal: Negative.   Genitourinary: Negative.   Musculoskeletal: Negative.   Skin: Negative.   Neurological: Negative.   Endo/Heme/Allergies: Negative.   Psychiatric/Behavioral: Negative for depression (Hx of), suicidal ideas, hallucinations, memory loss and substance abuse. The patient is nervous/anxious. The patient does not have insomnia.     Blood pressure 113/71, pulse 76, temperature 98.2 F (36.8 C), temperature source Oral, resp. rate 16, height 5' 7.5" (1.715 m), weight 53.071 kg (117 lb), SpO2 100 %.Body mass index is 18.04 kg/(m^2).   Musculoskeletal: Strength & Muscle Tone: within normal limits Gait & Station: normal Patient leans: N/A  Past Psychiatric History: Diagnosis: Denies  Hospitalizations: Denies  Outpatient Care: Denies  Substance Abuse Care: Denies  Self-Mutilation: Denies  Suicidal Attempts: Denies  Violent Behaviors: Denies   Discharge Diagnoses:   DSM5: Primary Psychiatric Diagnosis: Brief psychotic disorder Adjustment disorder with anxiety Non Psychiatric Diagnosis: See PMH  AXIS Diagnosis: AXIS I:  Psychosis NOS AXIS II: Deferred AXIS III:  Past Medical History  Diagnosis Date  . Parotid gland pain   . Nasal congestion    AXIS IV: recent relationship issues  AXIS V: 60 upon discharge  Level of Care:  OP  Hospital Course:  Nicholas Johnson. is an 24 y.o. male who came to the [facility] with his parents due to increasingly bizzare behavior since last Friday. His mother reported that the patient has not been leaving his room and has been heard talking to himself. His mother also expressed concerns that Nicholas Johnson felt he was being controlled by something. Since admission the patient has been very hyper religious only wanting to focus on his spiritual problems. Patient stated during his psychiatric assessment "I was misunderstood about some spiritual stuff. Everything would be fine if I could just figure it out. I was just speaking outside of myself. I have had a lot on my plate lately. I am a Christian. My father said that some Christians can hear God talking to them. I am one of those. God uses my mouth. I have sinned in the past and have angered God. I have been sad. I just want to deal with all this." The patient was observed to have written many comments about spirits on sheets of paper on his bed. One comment stated that God was not pleased with something the patient did. The patient reports the symptoms have only been bothering him for a few weeks. He denies any history of mania but indicated having some feelings of paranoia. Any question that is asked of the patient would be answered with a response about his spiritual struggles. The patient denies any current substance use. His current  urine drug screen and alcohol were negative. He denies having any history of mental illness. A CT scan of the head done in the [facility] was negative for organic causes. He was also observed to smile widely when various questions were asked to clarify his symptoms.   Nicholas Johnson was  admitted to [facility] to help with his crisis and to re-stabilize his moods.  It was discovered that he had been non-compliant with taking his Remeron.  Remeron was resumed to help control his anxiety symptoms.  Additionally, sleep hygiene was discussed in length so that patient is more cognizant of behaviors that contribute insomnia.  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 To help with psychosis and to help with patient's hyper religious preoccupations, Haldol 5 mg was given and Cogentin was added to help with possible EPS symptoms.  Vistaril prn for anxiety and Zyprexa zydis prn for agitation was added to his medication regimen.  At time of discharge, he rated both depression and anxiety levels to be manageable and minimal.  Per collateral information from patient's mother, it was stated that patient's pastor had advised him to "step down" temporarily and this may have contributed to patient's crisis event.  He will continue to work through this and has stated that his mother is a good support for him.  He denied physiological concerns/SI/HI/AVH at time of discharge.  He was encouraged to adhere to medication compliance and outpatient treatment at [facility].     Consults:  psychiatry  Significant Diagnostic Studies:  labs: per ED  Discharge Vitals:   Blood pressure 113/71, pulse 76, temperature 98.2 F (36.8 C), temperature source Oral, resp. rate 16, height 5' 7.5" (1.715 m), weight 53.071 kg (117 lb), SpO2 100 %. Body mass index is 18.04 kg/(m^2). Lab Results:   No results found for this or any previous visit (from the past 72 hour(s)).  Physical Findings: AIMS: Facial and Oral Movements Muscles of Facial Expression: None, normal Lips and Perioral Area: None, normal Jaw: None, normal Tongue: None, normal,Extremity Movements Upper (arms, wrists, hands, fingers): None,  normal Lower (legs, knees, ankles, toes): None, normal, Trunk Movements Neck, shoulders, hips: None, normal, Overall Severity Severity of abnormal movements (highest score from questions above): None, normal Incapacitation due to abnormal movements: None, normal Patient's awareness of abnormal movements (rate only patient's report): No Awareness, Dental Status Current problems with teeth and/or dentures?: No Does patient usually wear dentures?: No  CIWA:    COWS:     Psychiatric Specialty Exam: See Psychiatric Specialty Exam and Suicide Risk Assessment completed by Attending Physician prior to discharge.  Discharge destination:  Home  Is patient on multiple antipsychotic therapies at discharge:  No   Has Patient had three or more failed trials of antipsychotic monotherapy by history:  No  Recommended Plan for Multiple Antipsychotic Therapies: NA     Medication List    TAKE these medications      Indication   benztropine 0.5 MG tablet  Commonly known as:  COGENTIN  Take 1 tablet (0.5 mg total) by mouth 2 (two) times daily.   Indication:  Extrapyramidal Reaction caused by Medications     haloperidol 5 MG tablet  Commonly known as:  HALDOL  Take 1 tablet (5 mg total) by mouth 2 (two) times daily.   Indication:  Psychosis     hydrOXYzine 25 MG tablet  Commonly known as:  ATARAX/VISTARIL  Take 1 tablet (25 mg total) by mouth 3 (three) times daily as needed for anxiety.   Indication:  Anxiety Neurosis     ibuprofen 200 MG tablet  Commonly known as:  ADVIL,MOTRIN  Take 2 tablets (400 mg total) by mouth every 6 (six) hours as needed for headache (headache).   Indication:  Mild to Moderate Pain     mirtazapine 15 MG tablet  Commonly known as:  REMERON  Take 1 tablet (15 mg total) by mouth at bedtime.   Indication:  Trouble Sleeping       Follow-up Information    Follow up with [facility] On 11/03/2014.   Why:  Appt for therapy with Molli Hazard at 9:30AM.  At this appt, you will be scheduled for medication management with Dr. Manson Passey.    Contact information:   [address] [place], [place] [address] Phone: [phone] Fax: [phone]      Follow-up recommendations:  Activity:  as tol, diet as tol  Comments:  1.  Take all your medications as prescribed.              2.  Report any adverse side effects to outpatient provider.                       3.  Patient instructed to not use alcohol or illegal drugs while on prescription medicines.            4.  In the event of worsening symptoms, instructed patient to call 911, the crisis hotline or go to nearest emergency room for evaluation of symptoms.  Total Discharge Time:  Greater than 30 minutes.  SignedAdonis Brook MAY, AGNP-BC 10/26/2014, 2:28 PM   Patient seen, Suicide Assessment Completed.  Disposition Plan Reviewed

## 2014-10-26 NOTE — Progress Notes (Signed)
Discharge note: Pt received both written and verbal discharge instructions. Pt verbalized understanding of discharge instructions. Pt agreed to f/u appt and med regimen. Pt received sample meds, prescriptions, and belongings. Pt safely left [facility].

## 2014-10-26 NOTE — Tx Team (Signed)
Interdisciplinary Treatment Plan Update (Adult)   Date: 10/26/2014  Time Reviewed: 10:15 AM  Progress in Treatment:  Attending groups: yes  Participating in groups:  Yes   Taking medication as prescribed: Yes  Tolerating medication: Yes  Family/Significant othe contact made: SPE completed with pt's mother.   Patient understands diagnosis: Yes, AEB seeking treatment for confusion, mood instability, and for medication stabilization.  Discussing patient identified problems/goals with staff: Yes  Medical problems stabilized or resolved: Yes  Denies suicidal/homicidal ideation: Yes during group/self report.  Patient has not harmed self or Others: Yes  New problem(s) identified:  Discharge Plan or Barriers: [facility] agreeable to taking pt on as client. Therapy appt scheduled and they will schedule appt for med management when he comes to first therapy appt. Pt to return home to his apt. Needs note for work.  Additional comments:  Client is a 24 yo male admitted due to psychosis, per consult client was brought in by his parents because he was confused with his spirituality. Client also reported some confusion after a personal situation which lead him to being unsure about things so he began writing everything down in an effort not to forget. Parents reportedly told ED physician that client had locked himself in his room for over 24 hours, scribbling different things on multiple pieces of paper. This is clients first inpatient admission, he is a Consulting civil engineer at [employer], works as a Hospital doctor for [employer]. During this admission process the client is suspicious and animated, laughing and smiling inappropriately, moderately preoccupied, although he denies AVH. Client reports he came because "I'm dealing with some spiritual things" "my parents was concerned, wanted to make sure I was stable, make sure I wasn't tripping out" Client does admit to a break up with a GF about a month ago, "we decided to give  ourselves some space, but she really wasn't my GF" Client has no significant medical history. Writer reviewed admission forms, client agreed to treatment, was oriented to unit and room. Client refused sleep medications, noting that he slept well.           10/26/14: Pt smiling, pleasant, and cooperative during group this morning. Negative for all symptoms today.    Reason for Continuation of Hospitalization: none  Estimated length of stay: d/c today  For review of initial/current patient goals, please see plan of care.  Attendees:  Patient:    Family:    Physician: Dr. Ignatius Specking. Adela Glimpse  10/26/2014 10:15 AM   Nursing: Patrecia Pour RN 10/26/2014 10:15 AM   Clinical Social Worker Ludella Pranger Smart, LCSWA  10/26/2014 10:15 AM   Other: Santa Genera, LCSW 10/26/2014 10:15 AM   Other: Mercy Riding TCT  10/26/2014 10:15 AM   Other: Richelle Ito, LCSW 10/26/2014 10:15AM   Other:    Scribe for Treatment Team:  [employer] LCSWA 10/26/2014 10:15 AM

## 2014-10-26 NOTE — [facility] Group Notes (Signed)
[facility] LCSW Aftercare Discharge Planning Group Note   10/26/2014 11:07 AM  Participation Quality:  Appropriate   Mood/Affect:  Appropriate  Depression Rating:  0  Anxiety Rating:  0  Thoughts of Suicide:  No Will you contract for safety?   NA  Current AVH:  No  Plan for Discharge/Comments:  Pt reports that he had a good weekend and is feeling good today. Pt reports looking forward to d/c today. Follow-up at [facility]. Counseling. Pt plans to return to apt where he lives alone and plans to return to [employer] and to work at [employer] at d/c--work not in chart. Pt did not need school note.   Transportation Means: pt's mother (CSW left message for his mother informing her of d/c today)   Supports: mother/family supports   Counselling psychologist, Oncologist

## 2014-10-26 NOTE — Progress Notes (Signed)
Adult Psychoeducational Group Note  Date:  10/26/2014 Time:  12:08 PM  Group Topic/Focus:  Wellness Toolbox:   The focus of this group is to discuss various aspects of wellness, balancing those aspects and exploring ways to increase the ability to experience wellness.  Patients will create a wellness toolbox for use upon discharge.  Participation Level:  Did Not Attend  Participation Quality:    Affect:    Cognitive:    Insight:   Engagement in Group:    Modes of Intervention:    Additional Comments:  Pt did not attend group.  Marquis Lunch, Ashwika Freels 10/26/2014, 12:08 PM

## 2014-10-26 NOTE — [facility] Suicide Risk Assessment (Signed)
Demographic Factors:  24 year old , lives alone, in college, employed .  Total Time spent with patient: 30 minutes  Psychiatric Specialty Exam:  Physical Exam   ROS   Blood pressure 113/71, pulse 76, temperature 98.2 F (36.8 C), temperature source Oral, resp. rate 16, height 5' 7.5" (1.715 m), weight 53.071 kg (117 lb), SpO2 100 %.Body mass index is 18.04 kg/(m^2).   General Appearance: Well Groomed   Patent attorney::  Good   Speech:  Normal Rate   Volume:  Normal   Mood:  denies depression, mood improved    Affect:  Appropriate, states remains slightly anxious   Thought Process:  Goal Directed and Linear   Orientation:  Other:  fully alert and attentive   Thought Content:  no hallucinations, no delusions    Suicidal Thoughts:  No at this time denies any  Thoughts of hurting self or anyone else  and contracts for safety   Homicidal Thoughts:  No   Memory:  recent and remote grossly intact    Judgement:  Other:  improved    Insight:  Fair   Psychomotor Activity:  Normal   Concentration:  Good   Recall:  Good   Fund of Knowledge:Good   Language: Good   Akathisia:  NA   Handed:  Right   AIMS (if indicated):      Assets:  Communication Skills Desire for Improvement Physical Health Resilience   Sleep:  Number of Hours: 6.25    Musculoskeletal: Strength & Muscle Tone: within normal limits Gait & Station: normal Patient leans: N/A   Mental Status Per Nursing Assessment::   On Admission:  NA  Current Mental Status by Physician: At this time patient is alert, attentive, well related, calm, mood is "OK", affect appropriate, no thought disorder, no suicidal or homicidal ideations, future oriented. Still  Reports  spiritual " issues", but appears less religiously focused , as compared to admission status. Future oriented. Denies side effects from  Medications. No akathisia noted.  Loss Factors: Recent loss of relationship.  Historical Factors: this is first psychiatric  admission, no history of suicide attempts  Risk Reduction Factors:   Sense of responsibility to family, Employed and Positive social support  Continued Clinical Symptoms:  At this time patient is improved, states mood is "OK", denies depression , affect is appropriate, no thought disorder, denies suicidal ideations, denies homicidal ideations, continues to have " spiritual issues I have to work on" but less religiously preoccupied than upon admission. Does not appear internally preoccupied, states he is looking forward to returning to college. * Of note, with patient's consent I have spoken with his mother, who states he is better and agrees with discharge today. Cognitive Features That Contribute To Risk:  Alert, attentive, oriented x 3 upon discharge   Suicide Risk:  Mild:  Suicidal ideation of limited frequency, intensity, duration, and specificity.  There are no identifiable plans, no associated intent, mild dysphoria and related symptoms, good self-control (both objective and subjective assessment), few other risk factors, and identifiable protective factors, including available and accessible social support.  Discharge Diagnoses:   AXIS I:  Psychosis NOS AXIS II:  Deferred AXIS III:   Past Medical History  Diagnosis Date  . Parotid gland pain   . Nasal congestion    AXIS IV: recent relationship issues  AXIS V: 60 upon discharge  Plan Of Care/Follow-up recommendations:  Activity:  As tolerated Diet:  Regular Tests:  NA Other:  See below  Is patient on multiple antipsychotic therapies at discharge:  No   Has Patient had three or more failed trials of antipsychotic monotherapy by history:  No  Recommended Plan for Multiple Antipsychotic Therapies: NA  Patient is leaving unit in good spirits. Follow up as below Follow up with [facility] On 11/03/2014.    Why: Appt for therapy with Molli Hazard at 9:30AM. At this appt, you will be scheduled for medication  management with Dr. Manson Passey.    Contact information:   [address] [place], [place] [address] Phone: [phone] Fax: [phone]       Nehemiah Massed 10/26/2014, 10:22 AM

## 2014-10-26 NOTE — Clinical Social Work Note (Signed)
CSW left message for pt's mother (per her earlier request) with update--reporting that pt is d/cing today and going over follow-up at [facility].  [employer], LCSWA  10/26/2014 11:02 AM

## 2014-10-26 NOTE — [facility] Group Notes (Signed)
[facility] LCSW Group Therapy  10/26/2014 1:34 PM  Type of Therapy:  Group Therapy  Participation Level:  Did Not Attend-pt in process of being d/ced.  Summary of Progress/Problems: Today's Topic: Overcoming Obstacles. Pt identified obstacles faced currently and processed barriers involved in overcoming these obstacles. Pt identified steps necessary for overcoming these obstacles and explored motivation (internal and external) for facing these difficulties head on. Pt further identified one area of concern in their lives and chose a skill of focus pulled from their "toolbox."   Smart, Noel LCSWA 10/26/2014, 1:34 PM

## 2014-10-26 NOTE — Clinical Social Work Note (Signed)
Referral to [facility] made this morning per pt request. Waiting for NP at [facility] Counseling to review and call back.   [employer], LCSWA  10/26/2014 8:33 AM

## 2014-10-26 NOTE — Progress Notes (Signed)
[facility] Adult Case Management Discharge Plan :  Will you be returning to the same living situation after discharge: Yes, home to apt.  At discharge, do you have transportation home?:Yes,  mom Do you have the ability to pay for your medications:Yes,  [employer]  Release of information consent forms completed and submitted to Medical Records by CSW. Patient to Follow up at: Follow-up Information    Follow up with [facility] On 11/03/2014.   Why:  Appt for therapy with Molli Hazard at 9:30AM. At this appt, you will be scheduled for medication management with Dr. Manson Passey.    Contact information:   [address] [place], [place] [address] Phone: [phone] Fax: [phone]      Patient denies SI/HI:   Yes,  during group/self report.     Safety Planning and Suicide Prevention discussed:  Yes,  SPE completed with pt's mother. SPI pamphlet provided to pt and he was encouraged to share information with support network, ask questions, and talk about any concerns relating to SPE.  N/A patient is not a smoker  Smart, Joaquina Nissen LCSWA  10/26/2014, 10:08 AM

## 2014-10-28 NOTE — Progress Notes (Signed)
Patient Discharge Instructions:  After Visit Summary (AVS):   Faxed to:  10/28/14 Discharge Summary Note:   Faxed to:  10/28/14 Psychiatric Admission Assessment Note:   Faxed to:  10/28/14 Suicide Risk Assessment - Discharge Assessment:   Faxed to:  10/28/14 Faxed/Sent to the Next Level Care provider:  10/28/14 Faxed to [facility] @ [phone]  Jerelene ReddenSheena E Rosalie, 10/28/2014, 3:14 PM

## 2014-10-29 NOTE — Clinical Social Work Note (Signed)
Theron Aristaeter contacted CSW to see if he could get sooner therapy appt--[facility] does not have openings at this time for sooner than next week. [facility] and pt was encouraged to call them if interested, to see if they have any openings for Fri to be assessed for services. Pt verbalized understanding of all info and was encouraged to call back if he has further questions or concerns.  The Sherwin-WilliamsHeather Smart, LCSWA 10/29/2014 11:24 AM

## 2016-07-19 ENCOUNTER — Ambulatory Visit: Payer: Managed Care, Other (non HMO) | Admitting: Physician Assistant

## 2016-07-21 ENCOUNTER — Emergency Department (HOSPITAL_COMMUNITY)
Admission: EM | Admit: 2016-07-21 | Discharge: 2016-07-21 | Disposition: A | Discharge status: Home / Self Care | Payer: BLUE CROSS/BLUE SHIELD | Attending: Emergency Medicine | Admitting: Emergency Medicine

## 2016-07-21 ENCOUNTER — Encounter (HOSPITAL_COMMUNITY): Payer: Self-pay | Admitting: Emergency Medicine

## 2016-07-21 DIAGNOSIS — R4182 Altered mental status, unspecified: Secondary | ICD-10-CM | POA: Diagnosis present

## 2016-07-21 DIAGNOSIS — F1290 Cannabis use, unspecified, uncomplicated: Secondary | ICD-10-CM | POA: Insufficient documentation

## 2016-07-21 DIAGNOSIS — T40715A Adverse effect of cannabis, initial encounter: Secondary | ICD-10-CM

## 2016-07-21 DIAGNOSIS — T407X5A Adverse effect of cannabis (derivatives), initial encounter: Secondary | ICD-10-CM | POA: Insufficient documentation

## 2016-07-21 DIAGNOSIS — Z79899 Other long term (current) drug therapy: Secondary | ICD-10-CM | POA: Insufficient documentation

## 2016-07-21 NOTE — ED Provider Notes (Signed)
[facility]-EMERGENCY DEPT Provider Note: Nicholas Johnson Nicholas Dueitt, MD, FACEP  CSN: [id number] MRN: [id number] ARRIVAL: 07/21/16 at 0220   CHIEF COMPLAINT  Altered Mental Status   HISTORY OF PRESENT ILLNESS  Nicholas Overalleter J Kann Jr. is a 25 y.o. male presents to the ED via EMS with [employer] due to altered mental status. Pt states that he was smoking marijuana ~2 hours ago and that he began acting "crazy" and became agitated. Per [employer], pt was hollering and flailing and and was restrained. [employer] state that pt's condition has improved significantly and the patient believes he has returned to normal mentation.  Past Medical History:  Diagnosis Date  . Nasal congestion   . Parotid gland pain     History reviewed. No pertinent surgical history.  History reviewed. No pertinent family history.  Social History  Substance Use Topics  . Smoking status: Never Smoker  . Smokeless tobacco: Never Used  . Alcohol use No    Prior to Admission medications   Medication Sig Start Date End Date Taking? Authorizing Provider  benztropine (COGENTIN) 0.5 MG tablet Take 1 tablet (0.5 mg total) by mouth 2 (two) times daily. 10/26/14   Adonis BrookSheila Agustin, NP  haloperidol (HALDOL) 5 MG tablet Take 1 tablet (5 mg total) by mouth 2 (two) times daily. 10/26/14   Adonis BrookSheila Agustin, NP  hydrOXYzine (ATARAX/VISTARIL) 25 MG tablet Take 1 tablet (25 mg total) by mouth 3 (three) times daily as needed for anxiety. 10/26/14   Adonis BrookSheila Agustin, NP  ibuprofen (ADVIL,MOTRIN) 200 MG tablet Take 2 tablets (400 mg total) by mouth every 6 (six) hours as needed for headache (headache). 10/26/14   Adonis BrookSheila Agustin, NP  mirtazapine (REMERON) 15 MG tablet Take 1 tablet (15 mg total) by mouth at bedtime. 10/26/14   Adonis BrookSheila Agustin, NP    Allergies Review of patient's allergies indicates no known allergies.   REVIEW OF SYSTEMS  Negative except as noted here or in the History of Present Illness.   PHYSICAL EXAMINATION  Initial Vital Signs Blood pressure 137/88, pulse (!)  127, temperature 98.1 F (36.7 C), temperature source Oral, resp. rate 22, height 5\' 6"  (1.676 m), weight 125 lb (56.7 kg), SpO2 100 %.  Examination General: Well-developed, well-nourished male in no acute distress; appearance consistent with age of record HENT: normocephalic; atraumatic Eyes: pupils equal, round and reactive to light; extraocular muscles intact Neck: supple Heart: regular rate and rhythm Lungs: clear to auscultation bilaterally Abdomen: soft; nondistended; nontender; no masses or hepatosplenomegaly; bowel sounds present Extremities: No deformity; full range of motion; pulses normal Neurologic: Awake, alert and oriented; motor function intact in all extremities and symmetric; no facial droop Skin: Warm and dry Psychiatric: Normal mood and affect   RESULTS  Summary of this visit's results, reviewed by myself:   EKG Interpretation  Date/Time:    Ventricular Rate:    PR Interval:    QRS Duration:   QT Interval:    QTC Calculation:   R Axis:     Text Interpretation:        Laboratory Studies: No results found for this or any previous visit (from the past 24 hour(s)). Imaging Studies: No results found.  ED COURSE  Nursing notes and initial vitals signs, including pulse oximetry, reviewed.  Vitals:   07/21/16 0225 07/21/16 0230  BP: 137/88   Pulse: (!) 127   Resp: 22   Temp: 98.1 F (36.7 C)   TempSrc: Oral   SpO2: 100%   Weight:  125 lb (56.7 kg)  Height:  5\' 6"  (1.676 m)    PROCEDURES    ED DIAGNOSES     ICD-9-CM ICD-10-CM   1. Adverse reaction to cannabis, initial encounter E939.6 [id number]        Paula Libra, MD 07/21/16 [phone]

## 2016-07-21 NOTE — ED Notes (Signed)
PT currently calm and cooperative and out of handcuffs. PD at bedside to transport home.

## 2016-07-21 NOTE — ED Triage Notes (Signed)
Per EMS pt reported using Marijuana tonight. During transport pt ban becoming aggressive and stating that he had a demon attacking him. Pt currently in police custody and handcuffed to bed at this time.

## 2016-07-21 NOTE — ED Notes (Signed)
PT currently alert and oriented x4 but stated that he feels like demons are in him and continues to question if he is breathing and that he is paralysed. Pt consoled that he is breathing and is currently moving.

## 2016-07-21 NOTE — ED Notes (Signed)
Pt currently awaiting evaluation by provider. PD at bedside. Pt remains in handcuffs with normal pulse, movement, and sensation in upper extremities.

## 2016-09-04 ENCOUNTER — Ambulatory Visit (INDEPENDENT_AMBULATORY_CARE_PROVIDER_SITE_OTHER): Payer: BLUE CROSS/BLUE SHIELD | Admitting: Physician Assistant

## 2016-09-04 ENCOUNTER — Encounter: Payer: Self-pay | Admitting: Physician Assistant

## 2016-09-04 VITALS — BP 130/78 | HR 106 | Temp 98.1°F | Resp 16 | Wt 134.0 lb

## 2016-09-04 DIAGNOSIS — Z202 Contact with and (suspected) exposure to infections with a predominantly sexual mode of transmission: Secondary | ICD-10-CM | POA: Diagnosis not present

## 2016-09-04 NOTE — Progress Notes (Signed)
    Patient ID: Nicholas OverallPeter J Lardizabal Jr. MRN: [id number], DOB: 08/17/1991, 25 y.o. Date of Encounter: 09/04/2016, 4:17 PM    Chief Complaint:  Chief Complaint  Patient presents with  . std testing     HPI: 25 y.o. year old AA male presents for above.   He reports that he was recently informed by someone he had recently had sexual encounter with that they had chlamydia. He says that he wants to be tested for this. Discussed and recommended full STD screen and discussed that if at risk for one STD that he is at increased risk for other STDs and reason to screen for other STDs but he defers. Says that he only wants the test for the gonorrhea /chlamydia at this time and will think about/consider returning for blood work and further testing.  He reports that he has had no dysuria and has seen no penile discharge. No pelvic pain.     Home Meds:   Outpatient Medications Prior to Visit  Medication Sig Dispense Refill  . benztropine (COGENTIN) 0.5 MG tablet Take 1 tablet (0.5 mg total) by mouth 2 (two) times daily. (Patient not taking: Reported on 09/04/2016) 30 tablet 0  . haloperidol (HALDOL) 5 MG tablet Take 1 tablet (5 mg total) by mouth 2 (two) times daily. (Patient not taking: Reported on 09/04/2016) 60 tablet 0  . hydrOXYzine (ATARAX/VISTARIL) 25 MG tablet Take 1 tablet (25 mg total) by mouth 3 (three) times daily as needed for anxiety. (Patient not taking: Reported on 09/04/2016) 45 tablet 0  . ibuprofen (ADVIL,MOTRIN) 200 MG tablet Take 2 tablets (400 mg total) by mouth every 6 (six) hours as needed for headache (headache). (Patient not taking: Reported on 09/04/2016) 30 tablet 0  . mirtazapine (REMERON) 15 MG tablet Take 1 tablet (15 mg total) by mouth at bedtime. (Patient not taking: Reported on 09/04/2016) 30 tablet 0   No facility-administered medications prior to visit.     Allergies: No Known Allergies    Review of Systems: See HPI for pertinent ROS. All other ROS negative.     Physical Exam: Blood pressure 130/78, 96 pulse , temperature 98.1 F (36.7 C), temperature source Oral, resp. rate 16, weight 134 lb (60.8 kg), SpO2 99 %., Body mass index is 21.63 kg/m. General:  WNWD AAM. Appears in no acute distress. Neck: Supple. No thyromegaly. No lymphadenopathy. Lungs: Clear bilaterally to auscultation without wheezes, rales, or rhonchi. Breathing is unlabored. Heart: Regular rhythm. No murmurs, rubs, or gallops. Msk:  Strength and tone normal for age. Extremities/Skin: Warm and dry. Neuro: Alert and oriented X 3. Moves all extremities spontaneously. Gait is normal. CNII-XII grossly in tact. Psych:  Responds to questions appropriately with a normal affect.     ASSESSMENT AND PLAN:  25 y.o. year old male with  1. Exposure to sexually transmitted disease (STD) Will check Gonorrhea/Chlamydia. Will call him with result. He is to abstain from any sexual contact until he receives these results and receives treatment if indicated. Discussed condom use. Discussed returning for full STD screen. - GC/Chlamydia Probe Amp   Signed, [address] Beth [place], [place], [facility] 09/04/2016 4:17 PM

## 2016-09-05 LAB — GC/CHLAMYDIA PROBE AMP
CT Probe RNA: NOT DETECTED
GC Probe RNA: NOT DETECTED

## 2016-09-12 ENCOUNTER — Encounter: Payer: Self-pay | Admitting: Physician Assistant

## 2016-09-12 ENCOUNTER — Ambulatory Visit (INDEPENDENT_AMBULATORY_CARE_PROVIDER_SITE_OTHER): Payer: BLUE CROSS/BLUE SHIELD | Admitting: Physician Assistant

## 2016-09-12 VITALS — BP 110/78 | HR 72 | Temp 98.3°F | Resp 16 | Wt 131.0 lb

## 2016-09-12 DIAGNOSIS — R8290 Unspecified abnormal findings in urine: Secondary | ICD-10-CM | POA: Diagnosis not present

## 2016-09-12 LAB — URINALYSIS, ROUTINE W REFLEX MICROSCOPIC
Bilirubin Urine: NEGATIVE
Glucose, UA: NEGATIVE
Hgb urine dipstick: NEGATIVE
Ketones, ur: NEGATIVE
Leukocytes, UA: NEGATIVE
Nitrite: NEGATIVE
Protein, ur: NEGATIVE
Specific Gravity, Urine: 1.02 (ref 1.001–1.035)
pH: 7 (ref 5.0–8.0)

## 2016-09-12 NOTE — Progress Notes (Signed)
Patient ID: Nicholas OverallPeter J Varady Jr. MRN: [id number], DOB: 03/31/1991, 25 y.o. Date of Encounter: 09/12/2016, 3:48 PM    Chief Complaint:  Chief Complaint  Patient presents with  . Proteinuria     HPI: 25 y.o. year old male presents with above.   States that he recently had [place] was performed through his work. Says that they did a urine test that showed protein. They told him to follow-up with his PCP regarding this. He states that they did no blood work. Says that at that physical they noted to him that his blood pressure was excellent. He says that he does go to the gym some and he has been going a couple times a week recently. Says that he hasn't been drinking much water.     Home Meds:   Outpatient Medications Prior to Visit  Medication Sig Dispense Refill  . benztropine (COGENTIN) 0.5 MG tablet Take 1 tablet (0.5 mg total) by mouth 2 (two) times daily. (Patient not taking: Reported on 09/12/2016) 30 tablet 0  . haloperidol (HALDOL) 5 MG tablet Take 1 tablet (5 mg total) by mouth 2 (two) times daily. (Patient not taking: Reported on 09/12/2016) 60 tablet 0  . hydrOXYzine (ATARAX/VISTARIL) 25 MG tablet Take 1 tablet (25 mg total) by mouth 3 (three) times daily as needed for anxiety. (Patient not taking: Reported on 09/12/2016) 45 tablet 0  . ibuprofen (ADVIL,MOTRIN) 200 MG tablet Take 2 tablets (400 mg total) by mouth every 6 (six) hours as needed for headache (headache). (Patient not taking: Reported on 09/12/2016) 30 tablet 0  . mirtazapine (REMERON) 15 MG tablet Take 1 tablet (15 mg total) by mouth at bedtime. (Patient not taking: Reported on 09/12/2016) 30 tablet 0   No facility-administered medications prior to visit.     Allergies: No Known Allergies    Review of Systems: See HPI for pertinent ROS. All other ROS negative.    Physical Exam: Blood pressure 110/78, pulse 72, temperature 98.3 F (36.8 C), temperature source Oral,  resp. rate 16, weight 131 lb (59.4 kg), SpO2 98 %., Body mass index is 21.14 kg/m. General:  WNWD AAM. Appears in no acute distress. Neck: Supple. No thyromegaly. No lymphadenopathy. Lungs: Clear bilaterally to auscultation without wheezes, rales, or rhonchi. Breathing is unlabored. Heart: Regular rhythm. No murmurs, rubs, or gallops. Msk:  Strength and tone normal for age. Extremities/Skin: Warm and dry.  Neuro: Alert and oriented X 3. Moves all extremities spontaneously. Gait is normal. CNII-XII grossly in tact. Psych:  Responds to questions appropriately with a normal affect.   Results for orders placed or performed in visit on 09/12/16  Urinalysis, Routine w reflex microscopic (not at [facility])  Result Value Ref Range   Color, Urine YELLOW YELLOW   APPearance CLEAR CLEAR   Specific Gravity, Urine 1.020 1.001 - 1.035   pH 7.0 5.0 - 8.0   Glucose, UA NEGATIVE NEGATIVE   Bilirubin Urine NEGATIVE NEGATIVE   Ketones, ur NEGATIVE NEGATIVE   Hgb urine dipstick NEGATIVE NEGATIVE   Protein, ur NEGATIVE NEGATIVE   Nitrite NEGATIVE NEGATIVE   Leukocytes, UA NEGATIVE NEGATIVE     ASSESSMENT AND PLAN:  25 y.o. year old male with  1. Abnormal urine finding Urinalysis today is normal with no protein. Reassured patient that -- someone in his age group-- this can occur intermittently. Increase water intake. No further evaluation needed at this time. - Urinalysis, Routine w reflex microscopic (not at  [facility])   Signed, [address] Beth BlanketDixon, [place], [facility] 09/12/2016 3:48 PM

## 2017-02-06 ENCOUNTER — Encounter: Payer: Self-pay | Admitting: Family Medicine

## 2017-04-30 ENCOUNTER — Encounter: Payer: Self-pay | Admitting: Family Medicine

## 2017-06-20 ENCOUNTER — Encounter: Payer: Self-pay | Admitting: Family Medicine

## 2018-05-17 ENCOUNTER — Ambulatory Visit (INDEPENDENT_AMBULATORY_CARE_PROVIDER_SITE_OTHER): Payer: No Typology Code available for payment source | Admitting: Family Medicine

## 2018-05-17 ENCOUNTER — Encounter: Payer: Self-pay | Admitting: Family Medicine

## 2018-05-17 VITALS — BP 122/68 | HR 70 | Temp 98.1°F | Resp 16 | Ht 67.0 in | Wt 128.0 lb

## 2018-05-17 DIAGNOSIS — F458 Other somatoform disorders: Secondary | ICD-10-CM

## 2018-05-17 DIAGNOSIS — R198 Other specified symptoms and signs involving the digestive system and abdomen: Secondary | ICD-10-CM

## 2018-05-17 DIAGNOSIS — R0989 Other specified symptoms and signs involving the circulatory and respiratory systems: Secondary | ICD-10-CM

## 2018-05-17 NOTE — Progress Notes (Signed)
Subjective:    Patient ID: Nicholas OverallPeter J Gali Jr., male    DOB: 07/13/1991, 27 y.o.   MRN: [id number]  HPI5/8/14 Patient presents with a one-year history of pressure-like sensation at the angle of his right mandible near the parotid gland. He also complains of a "obstruction" between his ear and his nose. He states he hears a cracking or popping sensation in his ear. He has some mild hearing loss in that ear.  Pain began a couple years ago. 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 It was a sharp burning pain which passed quickly but he continues to have an uncomfortable pressure-like sensation that area.  I reviewed the records from his ENT doctor, as well as a CT scan of his neck, and an office visit with his dentist. 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  The CT scan was clear of parotid gland mass, or salivary gland obstructions.  The sinuses are also clear .  He has also seen a neurologist who tried empiric therapy for trigeminal neuralgia with gabapentin which did not help.  At that time, my plan was: The patient has a very interesting history. The obstruction sensation he describes sounds a bit like eustachian tube dysfunction. I'm going to try the patient on Flonase 2 sprays each nostril inhaled daily and Zyrtec 10 mg by mouth daily. We will see if this alleviates some of eustachian tube dysfunction and clears up this obstruction or pressure-like sensation in his right sinuses and nasal cavity. The pressure-like sensation that he feels near the angle of his right mandible could possibly be trigeminal neuralgia, parotid gland pain, postnasal drip. I will see if empiric treatment improves this as well.  He is to follow with me in one month.  05/13/13 Patient is here for recheck. He saw no benefit from Flonase to Zyrtec. He no longer complains of pain in or around his parotid gland.  However he continues to complain of a pressure behind his right eardrum. He complains of popping  and cracking in his right ear whenever he coughs or sneezes. He complains of decreased hearing in his right ear. He denies any pain in his right ear. He denies any rhinorrhea or congestion. He denies any sneezing or sinus pain. At that time, my plan was: 1. Eustachian tube dysfunction, right Patient would like to see a different ENT doctor for a second opinion.  I believe his symptoms are consistent with eustachian tube dysfunction and the patient may benefit from pressure equalization tubes. I would recommend tympanometry and possibly a CT scan of the sinuses but I would defer this to ENT. I will consult Dr. Suszanne Connerseoh for second opinion. - Ambulatory referral to ENT  06/26/13 Patient saw Dr. Suszanne Connerseoh.  He recommended a prednisone Dosepak and Valsalva maneuvers for eustachian tube dysfunction. Unfortunately the patient has not experienced any improvement. Furthermore he continues to complain of a pressure like sensation in the back of his throat at the level of the angle of the mandible. It is deep to the right side of his throat. He had a CT scan of the head and neck which was negative. He's also had a laryngoscopy performed by ENT which was negative. The symptoms have been present for over 2 years with no significant sequela. Unfortunately they do not seem to be improving.  At that time, my plan was: I recommended the patient followup with Dr. Suszanne Connerseoh as planned to discuss further intervention for his  eustachian tube dysfunction. Unfortunately I do not know what else to do or to try to treat the globus sensation in his throat. 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 I do not feel that it is related to reflux or psychiatric in nature. Therefore I will not charge the patient a copay today. I apologized to the patient but I do not know anything else that can be tried. I do believe the problem is benign and not life-threatening.     05/17/18 Patient has not been  seen in 5 years.  However he is here today reporting similar symptoms to those reported 5 years ago.  He reports a pressure like sensation below his ear near the angle of the mandible particularly on the right-hand side.  At times he reports it is a discomfort.  He also reports decreased hearing.  He reports a popping sensation in his ears.  He states that this occurs off and on and at times it is worse than at other times.  Today a hearing screen was performed today in the office and it is normal.  Both tympanic membranes appear healthy with no middle ear effusion.  There is no palpable abnormality in the neck near the angle of the mandible.  There is no lymphadenopathy.  There is no palpable masses.  Examination of the posterior oropharynx reveals no abnormality.  I reviewed the patient's CT scans from 5 years ago which were reassuring and normal Past Medical History:  Diagnosis Date  . Nasal congestion   . Parotid gland pain    No current outpatient medications on file prior to visit.   No current facility-administered medications on file prior to visit.    No Known Allergies Social History   Socioeconomic History  . Marital status: Married    Spouse name: Not on file  . Number of children: Not on file  . Years of education: Not on file  . Highest education level: Not on file  Occupational History  . Not on file  Social Needs  . Financial resource strain: Not on file  . Food insecurity:    Worry: Not on file    Inability: Not on file  . Transportation needs:    Medical: Not on file    Non-medical: Not on file  Tobacco Use  . Smoking status: Never Smoker  . Smokeless tobacco: Never Used  Substance and Sexual Activity  . Alcohol use: No  . Drug use: Yes    Types: Marijuana    Comment: once in 3 years  . Sexual activity: Not on file  Lifestyle  . Physical activity:    Days per week: Not on file    Minutes per session: Not on file  . Stress: Not on file  Relationships  .  Social connections:    Talks on phone: Not on file    Gets together: Not on file    Attends religious service: Not on file    Active member of club or organization: Not on file    Attends meetings of clubs or organizations: Not on file    Relationship status: Not on file  . Intimate partner violence:    Fear of current or ex partner: Not on file    Emotionally abused: Not on file    Physically abused: Not on file    Forced sexual activity: Not on file  Other Topics Concern  . Not on file  Social History Narrative  . Not on file      Review of Systems  All other systems reviewed and are negative.      Objective:   Physical Exam  Constitutional: He is oriented to person, place, and time. He appears well-developed and well-nourished.  HENT:  Head: Normocephalic.  Right Ear: Tympanic membrane, external ear and ear canal normal. Tympanic membrane is not injected, not scarred, not perforated, not erythematous, not retracted and not bulging. No middle ear effusion.  Left Ear: Tympanic membrane, external ear and ear canal normal. Tympanic membrane is not injected, not scarred, not perforated, not erythematous, not retracted and not bulging.  Nose: No mucosal edema, rhinorrhea, septal deviation or nasal septal hematoma. Right sinus exhibits no maxillary sinus tenderness and no frontal sinus tenderness. Left sinus exhibits no maxillary sinus tenderness and no frontal sinus tenderness.  Mouth/Throat: Oropharynx is clear and moist.  Eyes: Pupils are equal, round, and reactive to light. Conjunctivae and EOM are normal.  Neck: Normal range of motion. Neck supple. No JVD present. No tracheal deviation present. No thyromegaly present.  Cardiovascular: Normal rate, regular rhythm and normal heart sounds.  No murmur heard. Pulmonary/Chest: Effort normal and breath sounds normal. No stridor. No respiratory distress. He has no wheezes. He has no rales. He exhibits no tenderness.  Lymphadenopathy:     He has no cervical adenopathy.  Neurological: He is alert and oriented to person, place, and time. No cranial nerve deficit. Coordination normal.  Skin: Skin is warm.  Psychiatric: He has a normal mood and affect. His behavior is normal. Judgment and thought content normal.          Assessment & Plan:   I can see or find no abnormality on his exam.  I try to reassure the patient that these are similar symptoms to what he experienced 5 years ago.  I do not believe there is anything life-threatening or dangerous.  However the patient states he is bothered by the symptoms and would like a second opinion.  He would therefore like me to have him see another ear nose and throat physician for a second opinion.  Patient's affect is unusual today.  He seems very anxious.  I discussed with him whether or not anxiety could be playing a role in this unusual globus-like sensation that he is experiencing.  He does not believe so.  Therefore I will consult ENT for a second opinion per the patient's request.  I tried to assuage his fears as best I could however I see nothing amenable to treatment today on his exam.

## 2018-06-13 ENCOUNTER — Encounter: Payer: Self-pay | Admitting: Physician Assistant

## 2018-06-20 ENCOUNTER — Ambulatory Visit (INDEPENDENT_AMBULATORY_CARE_PROVIDER_SITE_OTHER): Payer: Self-pay | Admitting: Otolaryngology

## 2018-06-20 DIAGNOSIS — J310 Chronic rhinitis: Secondary | ICD-10-CM

## 2018-06-20 DIAGNOSIS — R070 Pain in throat: Secondary | ICD-10-CM

## 2018-06-20 DIAGNOSIS — H6983 Other specified disorders of Eustachian tube, bilateral: Secondary | ICD-10-CM

## 2018-07-08 ENCOUNTER — Encounter: Payer: Self-pay | Admitting: Family Medicine

## 2018-07-08 ENCOUNTER — Ambulatory Visit (INDEPENDENT_AMBULATORY_CARE_PROVIDER_SITE_OTHER): Payer: No Typology Code available for payment source | Admitting: Family Medicine

## 2018-07-08 VITALS — BP 110/60 | HR 72 | Temp 97.8°F | Ht 67.0 in | Wt 131.0 lb

## 2018-07-08 DIAGNOSIS — Z0000 Encounter for general adult medical examination without abnormal findings: Secondary | ICD-10-CM

## 2018-07-08 DIAGNOSIS — Z114 Encounter for screening for human immunodeficiency virus [HIV]: Secondary | ICD-10-CM

## 2018-07-08 NOTE — Progress Notes (Signed)
Subjective:    Patient ID: Nicholas OverallPeter J Gali Jr., male    DOB: 07/13/1991, 27 y.o.   MRN: [id number]  HPI5/8/14 Patient presents with a one-year history of pressure-like sensation at the angle of his right mandible near the parotid gland. He also complains of a "obstruction" between his ear and his nose. He states he hears a cracking or popping sensation in his ear. He has some mild hearing loss in that ear.  Pain began a couple years ago. 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 It was a sharp burning pain which passed quickly but he continues to have an uncomfortable pressure-like sensation that area.  I reviewed the records from his ENT doctor, as well as a CT scan of his neck, and an office visit with his dentist. 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  The CT scan was clear of parotid gland mass, or salivary gland obstructions.  The sinuses are also clear .  He has also seen a neurologist who tried empiric therapy for trigeminal neuralgia with gabapentin which did not help.  At that time, my plan was: The patient has a very interesting history. The obstruction sensation he describes sounds a bit like eustachian tube dysfunction. I'm going to try the patient on Flonase 2 sprays each nostril inhaled daily and Zyrtec 10 mg by mouth daily. We will see if this alleviates some of eustachian tube dysfunction and clears up this obstruction or pressure-like sensation in his right sinuses and nasal cavity. The pressure-like sensation that he feels near the angle of his right mandible could possibly be trigeminal neuralgia, parotid gland pain, postnasal drip. I will see if empiric treatment improves this as well.  He is to follow with me in one month.  05/13/13 Patient is here for recheck. He saw no benefit from Flonase to Zyrtec. He no longer complains of pain in or around his parotid gland.  However he continues to complain of a pressure behind his right eardrum. He complains of popping  and cracking in his right ear whenever he coughs or sneezes. He complains of decreased hearing in his right ear. He denies any pain in his right ear. He denies any rhinorrhea or congestion. He denies any sneezing or sinus pain. At that time, my plan was: 1. Eustachian tube dysfunction, right Patient would like to see a different ENT doctor for a second opinion.  I believe his symptoms are consistent with eustachian tube dysfunction and the patient may benefit from pressure equalization tubes. I would recommend tympanometry and possibly a CT scan of the sinuses but I would defer this to ENT. I will consult Dr. Suszanne Connerseoh for second opinion. - Ambulatory referral to ENT  06/26/13 Patient saw Dr. Suszanne Connerseoh.  He recommended a prednisone Dosepak and Valsalva maneuvers for eustachian tube dysfunction. Unfortunately the patient has not experienced any improvement. Furthermore he continues to complain of a pressure like sensation in the back of his throat at the level of the angle of the mandible. It is deep to the right side of his throat. He had a CT scan of the head and neck which was negative. He's also had a laryngoscopy performed by ENT which was negative. The symptoms have been present for over 2 years with no significant sequela. Unfortunately they do not seem to be improving.  At that time, my plan was: I recommended the patient followup with Dr. Suszanne Connerseoh as planned to discuss further intervention for his  eustachian tube dysfunction. Unfortunately I do not know what else to do or to try to treat the globus sensation in his throat. 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 I do not feel that it is related to reflux or psychiatric in nature. Therefore I will not charge the patient a copay today. I apologized to the patient but I do not know anything else that can be tried. I do believe the problem is benign and not life-threatening.     05/17/18 Patient has not been  seen in 5 years.  However he is here today reporting similar symptoms to those reported 5 years ago.  He reports a pressure like sensation below his ear near the angle of the mandible particularly on the right-hand side.  At times he reports it is a discomfort.  He also reports decreased hearing.  He reports a popping sensation in his ears.  He states that this occurs off and on and at times it is worse than at other times.  Today a hearing screen was performed today in the office and it is normal.  Both tympanic membranes appear healthy with no middle ear effusion.  There is no palpable abnormality in the neck near the angle of the mandible.  There is no lymphadenopathy.  There is no palpable masses.  Examination of the posterior oropharynx reveals no abnormality.  I reviewed the patient's CT scans from 5 years ago which were reassuring and normal.  At that time, my plan was: I can see or find no abnormality on his exam.  I try to reassure the patient that these are similar symptoms to what he experienced 5 years ago.  I do not believe there is anything life-threatening or dangerous.  However the patient states he is bothered by the symptoms and would like a second opinion.  He would therefore like me to have him see another ear nose and throat physician for a second opinion.  Patient's affect is unusual today.  He seems very anxious.  I discussed with him whether or not anxiety could be playing a role in this unusual globus-like sensation that he is experiencing.  He does not believe so.  Therefore I will consult ENT for a second opinion per the patient's request.  I tried to assuage his fears as best I could however I see nothing amenable to treatment today on his exam.  07/08/18 Patient has seen ENT and they have recommended trying a nasal steroid spray for possible eustachian tube dysfunction.  He is yet to pick up the spray and try it.  He is here today for complete physical exam.  He is due for a flu shot  as well as a tetanus shot which he politely declines.  He is also due for HIV screening which he consents to.  He also would like regular lab work including his cholesterol and blood sugar to be checked.  His review of systems is otherwise negative.  He denies any chest pain shortness of breath dyspnea on exertion.  He denies any cough, hemoptysis, shortness of breath.  He denies any nausea vomiting diarrhea.  He denies any headache or vision changes.  He denies any joint pains, rashes, intestinal problems, polyuria, polydipsia, dysuria, hematuria. Past Medical History:  Diagnosis Date  . Nasal congestion   . Parotid gland pain    No current outpatient medications on file prior to visit.   No current facility-administered  medications on file prior to visit.    No Known Allergies Social History   Socioeconomic History  . Marital status: Married    Spouse name: Not on file  . Number of children: Not on file  . Years of education: Not on file  . Highest education level: Not on file  Occupational History  . Not on file  Social Needs  . Financial resource strain: Not on file  . Food insecurity:    Worry: Not on file    Inability: Not on file  . Transportation needs:    Medical: Not on file    Non-medical: Not on file  Tobacco Use  . Smoking status: Never Smoker  . Smokeless tobacco: Never Used  Substance and Sexual Activity  . Alcohol use: No  . Drug use: Yes    Types: Marijuana    Comment: once in 3 years  . Sexual activity: Not on file  Lifestyle  . Physical activity:    Days per week: Not on file    Minutes per session: Not on file  . Stress: Not on file  Relationships  . Social connections:    Talks on phone: Not on file    Gets together: Not on file    Attends religious service: Not on file    Active member of club or organization: Not on file    Attends meetings of clubs or organizations: Not on file    Relationship status: Not on file  . Intimate partner violence:     Fear of current or ex partner: Not on file    Emotionally abused: Not on file    Physically abused: Not on file    Forced sexual activity: Not on file  Other Topics Concern  . Not on file  Social History Narrative  . Not on file      Review of Systems  All other systems reviewed and are negative.      Objective:   Physical Exam  Constitutional: He is oriented to person, place, and time. He appears well-developed and well-nourished. No distress.  HENT:  Head: Normocephalic.  Right Ear: Tympanic membrane, external ear and ear canal normal. Tympanic membrane is not injected, not scarred, not perforated, not erythematous, not retracted and not bulging. No middle ear effusion.  Left Ear: Tympanic membrane, external ear and ear canal normal. Tympanic membrane is not injected, not scarred, not perforated, not erythematous, not retracted and not bulging.  Nose: No mucosal edema, rhinorrhea, septal deviation or nasal septal hematoma. Right sinus exhibits no maxillary sinus tenderness and no frontal sinus tenderness. Left sinus exhibits no maxillary sinus tenderness and no frontal sinus tenderness.  Mouth/Throat: Oropharynx is clear and moist.  Eyes: Pupils are equal, round, and reactive to light. Conjunctivae and EOM are normal. Right eye exhibits no discharge. Left eye exhibits no discharge. No scleral icterus.  Neck: Normal range of motion. Neck supple. No JVD present. No tracheal deviation present. No thyromegaly present.  Cardiovascular: Normal rate, regular rhythm, normal heart sounds and intact distal pulses. Exam reveals no gallop and no friction rub.  No murmur heard. Pulmonary/Chest: Effort normal and breath sounds normal. No stridor. No respiratory distress. He has no wheezes. He has no rales. He exhibits no tenderness.  Abdominal: Soft. Bowel sounds are normal. He exhibits no distension and no mass. There is no tenderness. There is no rebound and no guarding. Hernia confirmed  negative in the right inguinal area and confirmed negative in the left inguinal  area.  Genitourinary: Testes normal and penis normal.  Musculoskeletal: He exhibits no edema.  Lymphadenopathy:    He has no cervical adenopathy.       Right: No inguinal adenopathy present.       Left: No inguinal adenopathy present.  Neurological: He is alert and oriented to person, place, and time. He displays normal reflexes. No cranial nerve deficit. Coordination normal.  Skin: Skin is warm. No rash noted. He is not diaphoretic. No erythema. No pallor.  Psychiatric: He has a normal mood and affect. His behavior is normal. Judgment and thought content normal.          Assessment & Plan:   General medical exam - Plan: CBC with Differential/Platelet, COMPLETE METABOLIC PANEL WITH GFR, Lipid panel  Encounter for screening for HIV - Plan: HIV Antibody (routine testing w rflx)  His exam today is normal.  Blood pressure is excellent.  I encouraged the patient to get his flu shot but he declined today.  He also declined a tetanus shot.  I will check a CBC, CMP, and fasting lipid panel to establish a baseline for this patient.  I will also screen the patient for HIV or his consent.  Regular anticipatory guidance is provided.

## 2018-07-09 LAB — CBC WITH DIFFERENTIAL/PLATELET
Basophils Absolute: 11 cells/uL (ref 0–200)
Basophils Relative: 0.3 %
Eosinophils Absolute: 11 cells/uL — ABNORMAL LOW (ref 15–500)
Eosinophils Relative: 0.3 %
HCT: 42.6 % (ref 38.5–50.0)
Hemoglobin: 14.5 g/dL (ref 13.2–17.1)
Lymphs Abs: 1421 cells/uL (ref 850–3900)
MCH: 28.8 pg (ref 27.0–33.0)
MCHC: 34 g/dL (ref 32.0–36.0)
MCV: 84.7 fL (ref 80.0–100.0)
MPV: 11.4 fL (ref 7.5–12.5)
Monocytes Relative: 7.4 %
Neutro Abs: 2075 cells/uL (ref 1500–7800)
Neutrophils Relative %: 54.6 %
Platelets: 178 10*3/uL (ref 140–400)
RBC: 5.03 10*6/uL (ref 4.20–5.80)
RDW: 12.2 % (ref 11.0–15.0)
Total Lymphocyte: 37.4 %
WBC mixed population: 281 cells/uL (ref 200–950)
WBC: 3.8 10*3/uL (ref 3.8–10.8)

## 2018-07-09 LAB — COMPLETE METABOLIC PANEL WITH GFR
AG Ratio: 2.3 (calc) (ref 1.0–2.5)
ALT: 15 U/L (ref 9–46)
AST: 25 U/L (ref 10–40)
Albumin: 4.9 g/dL (ref 3.6–5.1)
Alkaline phosphatase (APISO): 48 U/L (ref 40–115)
BUN: 12 mg/dL (ref 7–25)
CO2: 29 mmol/L (ref 20–32)
Calcium: 10 mg/dL (ref 8.6–10.3)
Chloride: 103 mmol/L (ref 98–110)
Creat: 0.86 mg/dL (ref 0.60–1.35)
GFR, Est African American: 139 mL/min/{1.73_m2} (ref >=60)
GFR, Est Non African American: 120 mL/min/{1.73_m2} (ref >=60)
Globulin: 2.1 g/dL (calc) (ref 1.9–3.7)
Glucose, Bld: 76 mg/dL (ref 65–99)
Potassium: 4.4 mmol/L (ref 3.5–5.3)
Sodium: 141 mmol/L (ref 135–146)
Total Bilirubin: 0.6 mg/dL (ref 0.2–1.2)
Total Protein: 7 g/dL (ref 6.1–8.1)

## 2018-07-09 LAB — LIPID PANEL
Cholesterol: 157 mg/dL (ref <200)
HDL: 50 mg/dL (ref >40)
LDL Cholesterol (Calc): 96 mg/dL (calc)
Non-HDL Cholesterol (Calc): 107 mg/dL (calc) (ref <130)
Total CHOL/HDL Ratio: 3.1 (calc) (ref <5.0)
Triglycerides: 36 mg/dL (ref <150)

## 2018-07-09 LAB — HIV ANTIBODY (ROUTINE TESTING W REFLEX): HIV 1&2 Ab, 4th Generation: NONREACTIVE

## 2018-07-25 ENCOUNTER — Telehealth: Payer: Self-pay | Admitting: Family Medicine

## 2018-07-25 NOTE — Telephone Encounter (Signed)
[phone] cb#  Patient calling to discuss pain in his thighs, ache/throbbing. He is requesting suggestions for over the counter medication.

## 2018-07-26 NOTE — Telephone Encounter (Signed)
Patient aware of providers recommendations.  

## 2018-07-26 NOTE — Telephone Encounter (Signed)
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

## 2018-07-26 NOTE — Telephone Encounter (Signed)
Ibuprofen, However, ntbs if pain worsens or fails to improve as we have not seen him for his leg pain (not sure what the cause is).

## 2018-10-09 ENCOUNTER — Ambulatory Visit (INDEPENDENT_AMBULATORY_CARE_PROVIDER_SITE_OTHER): Payer: No Typology Code available for payment source | Admitting: Family Medicine

## 2018-10-09 ENCOUNTER — Encounter: Payer: Self-pay | Admitting: Family Medicine

## 2018-10-09 VITALS — BP 108/60 | HR 73 | Temp 99.1°F | Ht 67.0 in | Wt 136.5 lb

## 2018-10-09 DIAGNOSIS — J069 Acute upper respiratory infection, unspecified: Secondary | ICD-10-CM

## 2018-10-09 LAB — INFLUENZA A AND B AG, IMMUNOASSAY
INFLUENZA A ANTIGEN: NOT DETECTED
INFLUENZA B ANTIGEN: NOT DETECTED

## 2018-10-09 MED ORDER — ONDANSETRON 4 MG PO TBDP: 4.0000 mg | ORAL_TABLET | Freq: Three times a day (TID) | ORAL | 0 refills | Status: DC | PRN

## 2018-10-09 NOTE — Progress Notes (Signed)
Patient ID: Nicholas Johnson., male    DOB: 1991/08/26, 27 y.o.   MRN: [id number]  PCP: Nicholas Brooks, MD  Chief Complaint  Patient presents with  . Headache    Patient has c/o headache, fever, fatigue. Onset since yesterday.     Subjective:   Nicholas Johnson. is a 27 y.o. male, presents to clinic with CC of severe HA with fever to 102.2 and fatigue, came on suddenly yesterday.  He has taken ibuprofen 1 or 2 times and it does help a little bit with his symptoms.  He feels better when he lays down and rests.  He has not been eating or drinking very much and he did go to work all day today out in the sun states that he did not feel very well and he had a fever of 101.8 when he went to work and had 102.2 when he was completed with work.  He denies any eye pain, nausea, vomiting, diarrhea, abdominal pain, nasal congestion or discharge, cough wheeze or sore throat.  He denies any body aches.  Generally feels unwell with his fever and headache which is located across his forehead, described as throbbing and has been moderate in severity.  Port several sick contacts at work who have also had viral illnesses, fevers and he believes he may have been exposed to flu.  He denies any history of lung disease, asthma.   Patient Active Problem List   Diagnosis Date Noted  . 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 Altered mental status   . Psychoses (HCC)   . Parotid gland pain   . Nasal congestion      Prior to Admission medications   Not on File     No Known Allergies   No family history on file.   Social History   Socioeconomic History  . Marital status: Married    Spouse name: Not on file  . Number of children: Not on file  . Years of education: Not on file  . Highest education level: Not on file  Occupational History  . Not on file  Social Needs  . Financial resource strain: Not on file  . Food insecurity:    Worry: Not on file    Inability: Not on file  . Transportation needs:     Medical: Not on file    Non-medical: Not on file  Tobacco Use  . Smoking status: Never Smoker  . Smokeless tobacco: Never Used  Substance and Sexual Activity  . Alcohol use: No  . Drug use: Yes    Types: Marijuana    Comment: once in 3 years  . Sexual activity: Not on file  Lifestyle  . Physical activity:    Days per week: Not on file    Minutes per session: Not on file  . Stress: Not on file  Relationships  . Social connections:    Talks on phone: Not on file    Gets together: Not on file    Attends religious service: Not on file    Active member of club or organization: Not on file    Attends meetings of clubs or organizations: Not on file    Relationship status: Not on file  . Intimate partner violence:    Fear of current or ex partner: Not on file    Emotionally abused: Not on file    Physically abused: Not on file    Forced sexual activity: Not on file  Other  Topics Concern  . Not on file  Social History Narrative  . Not on file     Review of Systems  Constitutional: Positive for fever. Negative for activity change, appetite change, chills, diaphoresis, fatigue and unexpected weight change.  HENT: Negative.   Eyes: Negative.   Respiratory: Negative.  Negative for cough, chest tightness, shortness of breath and wheezing.   Cardiovascular: Negative.  Negative for chest pain and palpitations.  Gastrointestinal: Negative.   Endocrine: Negative.   Genitourinary: Negative.   Musculoskeletal: Negative.   Skin: Negative.   Allergic/Immunologic: Negative.   Neurological: Positive for headaches.  Hematological: Negative.   Psychiatric/Behavioral: Negative.   All other systems reviewed and are negative.      Objective:    Vitals:   10/09/18 1525  BP: 108/60  Pulse: 73  Temp: 99.1 F (37.3 C)  TempSrc: Oral  SpO2: 99%  Weight: 136 lb 8 oz (61.9 kg)  Height: 5\' 7"  (1.702 m)      Physical Exam Vitals signs and nursing note reviewed.  Constitutional:       General: He is not in acute distress.    Appearance: Normal appearance. He is well-developed and normal weight. He is not ill-appearing, toxic-appearing or diaphoretic.     Comments: Well appearing young man, alert, talkative, upbeat.  Not ill appearing, NAD  HENT:     Head: Normocephalic and atraumatic.     Jaw: No trismus.     Right Ear: Tympanic membrane, ear canal and external ear normal.     Left Ear: Tympanic membrane, ear canal and external ear normal.     Nose: Mucosal edema and rhinorrhea present. No congestion.     Right Sinus: No maxillary sinus tenderness or frontal sinus tenderness.     Left Sinus: No maxillary sinus tenderness or frontal sinus tenderness.     Mouth/Throat:     Mouth: Mucous membranes are moist. Mucous membranes are not pale, not dry and not cyanotic.     Pharynx: Uvula midline. Posterior oropharyngeal erythema present. No oropharyngeal exudate or uvula swelling.     Tonsils: No tonsillar exudate or tonsillar abscesses.  Eyes:     General: Lids are normal. No scleral icterus.       Right eye: No discharge.        Left eye: No discharge.     Conjunctiva/sclera: Conjunctivae normal.     Pupils: Pupils are equal, round, and reactive to light.  Neck:     Musculoskeletal: Normal range of motion and neck supple. No neck rigidity.     Trachea: Trachea and phonation normal. No tracheal deviation.  Cardiovascular:     Rate and Rhythm: Normal rate and regular rhythm.     Pulses: Normal pulses.          Radial pulses are 2+ on the right side and 2+ on the left side.       Posterior tibial pulses are 2+ on the right side and 2+ on the left side.     Heart sounds: Normal heart sounds. No murmur. No friction rub. No gallop.   Pulmonary:     Effort: Pulmonary effort is normal. No tachypnea, accessory muscle usage or respiratory distress.     Breath sounds: Normal breath sounds. No stridor. No decreased breath sounds, wheezing, rhonchi or rales.  Chest:     Chest  wall: No tenderness.  Abdominal:     General: Bowel sounds are normal. There is no distension.     Palpations:  Abdomen is soft.     Tenderness: There is no abdominal tenderness. There is no guarding or rebound.  Musculoskeletal: Normal range of motion.  Lymphadenopathy:     Cervical: No cervical adenopathy.  Skin:    General: Skin is warm and dry.     Capillary Refill: Capillary refill takes less than 2 seconds.     Coloration: Skin is not jaundiced or pale.     Findings: No lesion or rash.     Nails: There is no clubbing.   Neurological:     Mental Status: He is alert and oriented to person, place, and time.     Cranial Nerves: No cranial nerve deficit.     Motor: No abnormal muscle tone.     Coordination: Coordination normal.     Gait: Gait normal.  Psychiatric:        Mood and Affect: Mood normal.        Speech: Speech normal.        Behavior: Behavior normal. Behavior is cooperative.     Results for orders placed or performed in visit on 10/09/18  Influenza A and B Ag, Immunoassay  Result Value Ref Range   Source: NASOPHARYNX    INFLUENZA A ANTIGEN NOT DETECTED NOT DETECT   INFLUENZA B ANTIGEN NOT DETECTED NOT DETECT   COMMENT:           Assessment & Plan:      ICD-10-CM   1. Viral upper respiratory tract infection J06.9 Influenza A and B Ag, Immunoassay    ondansetron (ZOFRAN ODT) 4 MG disintegrating tablet     27 year old male, otherwise healthy presents with 1 day of headache with fever, double sick contacts at work, he has no other associated focal symptoms.  He has throbbing pain across his forehead, he has a little bit of queasiness but he has not been eating on purpose, has not been drinking very much, did go to work all day with a fever between 100 102 without much over-the-counter antipyretic treatment.  He denies any sore throat, eye pain, neck pain or stiffness, coughing, nasal congestion or discharge, chest pain, wheeze, shortness of breath, abdominal  pain, nausea, vomiting, diarrhea. Is extremely well-appearing in the room is very energetic and talkative does not appear ill, flu done and was negative.  Given his generally well appearance and no history of immunocompromise or lung disease I do not think even needs any coverage for flu in case he had a false negative I suspect he has another viral illness.  He had a little bit of nasal discharge and very mild posterior oropharyngeal erythema and he may have a viral URI.  I did write him a work note so he could stay home while febrile, he was encouraged to push fluids, eat easy to digest foods, take Tylenol and ibuprofen to limit fever and headache symptoms.  I do believe some of his nausea and headache is secondary to not eating and drinking and not treating his fever.  His lungs were clear and he otherwise is very well-appearing encouraged him to follow-up if he has any prolonged illnesses does not improve or if he develops any new focal symptoms.  Danelle Berry, PA-C 10/09/18 3:54 PM

## 2018-10-11 ENCOUNTER — Encounter: Payer: Self-pay | Admitting: Family Medicine

## 2018-10-14 ENCOUNTER — Other Ambulatory Visit: Payer: Self-pay

## 2018-10-14 DIAGNOSIS — J069 Acute upper respiratory infection, unspecified: Secondary | ICD-10-CM

## 2018-10-14 MED ORDER — ONDANSETRON HCL 4 MG PO TABS: 4.0000 mg | ORAL_TABLET | Freq: Three times a day (TID) | ORAL | 0 refills | Status: DC | PRN

## 2018-10-14 NOTE — Progress Notes (Signed)
Received a fax from the pharmacy stating that ODT tablets were not available. Regular zofran tablets sent into the pharmacy

## 2018-10-29 ENCOUNTER — Ambulatory Visit (INDEPENDENT_AMBULATORY_CARE_PROVIDER_SITE_OTHER): Payer: PRIVATE HEALTH INSURANCE | Admitting: Family Medicine

## 2018-10-29 ENCOUNTER — Encounter: Payer: Self-pay | Admitting: Family Medicine

## 2018-10-29 VITALS — BP 110/70 | HR 68 | Temp 98.2°F | Resp 14 | Ht 67.0 in | Wt 133.0 lb

## 2018-10-29 DIAGNOSIS — B009 Herpesviral infection, unspecified: Secondary | ICD-10-CM | POA: Diagnosis not present

## 2018-10-29 NOTE — Progress Notes (Signed)
Subjective:    Patient ID: Nicholas Johnson., male    DOB: [birth year], 28 y.o.   MRN: [id number]  HPI Patient is here today to go over lab work that he had performed at another clinic.  He went to an urgent care for STD testing.  He was asymptomatic.  He was the being seen there for a flulike illness but while he was there he asked for STD testing.  His tests were negative including gonorrhea, chlamydia test, syphilis testing, HIV testing.  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  IgM antibodies to HSV 1 were negative.  IgG antibodies to HSV 2 were negative.  IgM antibodies to HSV 2 were negative.  Patient is here today because he is confused by the results.  First I asked the patient if he is ever had a cold sore.  Patient states that he has a history of cold sores but he has not had a cold sore since he was a teenager.  I explained to the patient the HSV-1 is typically the virus associated with cold sores.  I then asked the patient if he is ever had a history of genital ulcers.  Patient states that he has never had a history of genital ulcers.  Therefore most likely the patient has a history of cold sores and the IgG antibody levels reflect this.  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  I explained to the patient that he can spread this virus to another person through contact such as saliva.  He is contagious 1 to 2 days before ulcers develop.  I explained to the patient that he should avoid close contact if he feels a cold sore developing or if he has a cold sore. Past Medical History:  Diagnosis Date  . Nasal congestion   . Parotid gland pain    No past surgical history on file. Current Outpatient Medications on File Prior to Visit  Medication Sig Dispense Refill  . sertraline (ZOLOFT) 100 MG tablet Take 100 mg by mouth daily.     No current facility-administered medications on file prior to visit.    No  Known Allergies Social History   Socioeconomic History  . Marital status: Married    Spouse name: Not on file  . Number of children: Not on file  . Years of education: Not on file  . Highest education level: Not on file  Occupational History  . Not on file  Social Needs  . Financial resource strain: Not on file  . Food insecurity:    Worry: Not on file    Inability: Not on file  . Transportation needs:    Medical: Not on file    Non-medical: Not on file  Tobacco Use  . Smoking status: Never Smoker  . Smokeless tobacco: Never Used  Substance and Sexual Activity  . Alcohol use: No  . Drug use: Yes    Types: Marijuana    Comment: once in 3 years  . Sexual activity: Not on file  Lifestyle  . Physical activity:    Days per week: Not on file    Minutes per session: Not on file  . Stress: Not on file  Relationships  . Social connections:    Talks on phone: Not on file    Gets together: Not on file    Attends religious service: Not on file  Active member of club or organization: Not on file    Attends meetings of clubs or organizations: Not on file    Relationship status: Not on file  . Intimate partner violence:    Fear of current or ex partner: Not on file    Emotionally abused: Not on file    Physically abused: Not on file    Forced sexual activity: Not on file  Other Topics Concern  . Not on file  Social History Narrative  . Not on file      Review of Systems  All other systems reviewed and are negative.      Objective:   Physical Exam Vitals signs reviewed.  Constitutional:      Appearance: Normal appearance.  HENT:     Nose: Nose normal.     Mouth/Throat:     Mouth: Mucous membranes are moist.     Pharynx: Oropharynx is clear. No oropharyngeal exudate or posterior oropharyngeal erythema.  Cardiovascular:     Rate and Rhythm: Normal rate and regular rhythm.     Pulses: Normal pulses.     Heart sounds: Normal heart sounds.  Pulmonary:      Effort: Pulmonary effort is normal. No respiratory distress.     Breath sounds: Normal breath sounds. No wheezing, rhonchi or rales.  Neurological:     Mental Status: He is alert.           Assessment & Plan:  Routine cultures positive for HSV1  Spent 15 minutes going over the test results.  I explained to him that the blood test is positive for an antibody to the virus typically associated with cold sores.  This appears to be an old infection most likely due to the cold sores he was experiencing when he was a teenager.  I explained how this viruses contracted and when he is contagious.  At the present time he has no desire to start antiviral therapy as he has not had a cold sore in several years.  All questions were answered.

## 2018-11-19 ENCOUNTER — Ambulatory Visit: Payer: No Typology Code available for payment source | Admitting: Family Medicine

## 2019-05-15 ENCOUNTER — Ambulatory Visit (INDEPENDENT_AMBULATORY_CARE_PROVIDER_SITE_OTHER): Payer: PRIVATE HEALTH INSURANCE | Admitting: Family Medicine

## 2019-05-15 ENCOUNTER — Other Ambulatory Visit: Payer: Self-pay

## 2019-05-15 ENCOUNTER — Encounter: Payer: Self-pay | Admitting: Family Medicine

## 2019-05-15 VITALS — BP 120/78 | HR 78 | Temp 98.3°F | Resp 16 | Ht 67.0 in | Wt 135.0 lb

## 2019-05-15 DIAGNOSIS — R1084 Generalized abdominal pain: Secondary | ICD-10-CM | POA: Diagnosis not present

## 2019-05-15 NOTE — Progress Notes (Signed)
Subjective:    Patient ID: Nicholas OverallPeter J Ridling Jr., male    DOB: 05/10/1991, 28 y.o.   MRN: [id number]  HPI5/8/14 Patient presents with a one-year history of pressure-like sensation at the angle of his right mandible near the parotid gland. He also complains of a "obstruction" between his ear and his nose. He states he hears a cracking or popping sensation in his ear. He has some mild hearing loss in that ear.  Pain began a couple years ago. 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 It was a sharp burning pain which passed quickly but he continues to have an uncomfortable pressure-like sensation that area.  I reviewed the records from his ENT doctor, as well as a CT scan of his neck, and an office visit with his dentist. 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  The CT scan was clear of parotid gland mass, or salivary gland obstructions.  The sinuses are also clear .  He has also seen a neurologist who tried empiric therapy for trigeminal neuralgia with gabapentin which did not help.  At that time, my plan was: The patient has a very interesting history. The obstruction sensation he describes sounds a bit like eustachian tube dysfunction. I'm going to try the patient on Flonase 2 sprays each nostril inhaled daily and Zyrtec 10 mg by mouth daily. We will see if this alleviates some of eustachian tube dysfunction and clears up this obstruction or pressure-like sensation in his right sinuses and nasal cavity. The pressure-like sensation that he feels near the angle of his right mandible could possibly be trigeminal neuralgia, parotid gland pain, postnasal drip. I will see if empiric treatment improves this as well.  He is to follow with me in one month.  05/13/13 Patient is here for recheck. He saw no benefit from Flonase to Zyrtec. He no longer complains of pain in or around his parotid gland.  However he continues to complain of a pressure behind his right eardrum. He complains of popping  and cracking in his right ear whenever he coughs or sneezes. He complains of decreased hearing in his right ear. He denies any pain in his right ear. He denies any rhinorrhea or congestion. He denies any sneezing or sinus pain. At that time, my plan was: 1. Eustachian tube dysfunction, right Patient would like to see a different ENT doctor for a second opinion.  I believe his symptoms are consistent with eustachian tube dysfunction and the patient may benefit from pressure equalization tubes. I would recommend tympanometry and possibly a CT scan of the sinuses but I would defer this to ENT. I will consult Dr. Suszanne Connerseoh for second opinion. - Ambulatory referral to ENT  06/26/13 Patient saw Dr. Suszanne Connerseoh.  He recommended a prednisone Dosepak and Valsalva maneuvers for eustachian tube dysfunction. Unfortunately the patient has not experienced any improvement. Furthermore he continues to complain of a pressure like sensation in the back of his throat at the level of the angle of the mandible. It is deep to the right side of his throat. He had a CT scan of the head and neck which was negative. He's also had a laryngoscopy performed by ENT which was negative. The symptoms have been present for over 2 years with no significant sequela. Unfortunately they do not seem to be improving.  At that time, my plan was: I recommended the patient followup with Dr. Suszanne Connerseoh as planned to discuss further intervention for his  eustachian tube dysfunction. Unfortunately I do not know what else to do or to try to treat the globus sensation in his throat. 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 I do not feel that it is related to reflux or psychiatric in nature. Therefore I will not charge the patient a copay today. I apologized to the patient but I do not know anything else that can be tried. I do believe the problem is benign and not life-threatening.     05/17/18 Patient has not been  seen in 5 years.  However he is here today reporting similar symptoms to those reported 5 years ago.  He reports a pressure like sensation below his ear near the angle of the mandible particularly on the right-hand side.  At times he reports it is a discomfort.  He also reports decreased hearing.  He reports a popping sensation in his ears.  He states that this occurs off and on and at times it is worse than at other times.  Today a hearing screen was performed today in the office and it is normal.  Both tympanic membranes appear healthy with no middle ear effusion.  There is no palpable abnormality in the neck near the angle of the mandible.  There is no lymphadenopathy.  There is no palpable masses.  Examination of the posterior oropharynx reveals no abnormality.  I reviewed the patient's CT scans from 5 years ago which were reassuring and normal.  At that time, my plan was: I can see or find no abnormality on his exam.  I try to reassure the patient that these are similar symptoms to what he experienced 5 years ago.  I do not believe there is anything life-threatening or dangerous.  However the patient states he is bothered by the symptoms and would like a second opinion.  He would therefore like me to have him see another ear nose and throat physician for a second opinion.  Patient's affect is unusual today.  He seems very anxious.  I discussed with him whether or not anxiety could be playing a role in this unusual globus-like sensation that he is experiencing.  He does not believe so.  Therefore I will consult ENT for a second opinion per the patient's request.  I tried to assuage his fears as best I could however I see nothing amenable to treatment today on his exam.  07/08/18 Patient has seen ENT and they have recommended trying a nasal steroid spray for possible eustachian tube dysfunction.  He is yet to pick up the spray and try it.  He is here today for complete physical exam.  He is due for a flu shot  as well as a tetanus shot which he politely declines.  He is also due for HIV screening which he consents to.  He also would like regular lab work including his cholesterol and blood sugar to be checked.  His review of systems is otherwise negative.  He denies any chest pain shortness of breath dyspnea on exertion.  He denies any cough, hemoptysis, shortness of breath.  He denies any nausea vomiting diarrhea.  He denies any headache or vision changes.  He denies any joint pains, rashes, intestinal problems, polyuria, polydipsia, dysuria, hematuria.  05/15/19 Patient presents today with several months of discomfort in his abdomen.  The discomfort is not localized.  It is a generalized pressure sensation.  He draws a circle on his  belly with his hand from below the xiphoid process all the way to the suprapubic area.  He states that it feels tight at times.  It is worse if he bends forward.  There is no visible hernia.  I had the patient perform Valsalva and there is no palpable hernia.  I had the patient perform a sit up while lying on the exam table against resistance.  This does not exacerbate the pain nor does it highlight a hernia.  Patient denies any fever or chills.  He denies any nausea or vomiting.  He denies any diarrhea.  He denies any melena or hematochezia however he does state that he has had a difficult time going to the restroom.  He states that he goes to the bathroom every 3 to 4 days.  Is typically hard.  At one point he had some mild blood when he had a bowel movement due to the straining of having the bowel movement.  He denies any weight loss. Past Medical History:  Diagnosis Date  . Nasal congestion   . Parotid gland pain    No current outpatient medications on file prior to visit.   No current facility-administered medications on file prior to visit.    No Known Allergies Social History   Socioeconomic History  . Marital status: Married    Spouse name: Not on file  . Number of  children: Not on file  . Years of education: Not on file  . Highest education level: Not on file  Occupational History  . Not on file  Social Needs  . Financial resource strain: Not on file  . Food insecurity    Worry: Not on file    Inability: Not on file  . Transportation needs    Medical: Not on file    Non-medical: Not on file  Tobacco Use  . Smoking status: Never Smoker  . Smokeless tobacco: Never Used  Substance and Sexual Activity  . Alcohol use: No  . Drug use: Yes    Types: Marijuana    Comment: once in 3 years  . Sexual activity: Not on file  Lifestyle  . Physical activity    Days per week: Not on file    Minutes per session: Not on file  . Stress: Not on file  Relationships  . Social Musicianconnections    Talks on phone: Not on file    Gets together: Not on file    Attends religious service: Not on file    Active member of club or organization: Not on file    Attends meetings of clubs or organizations: Not on file    Relationship status: Not on file  . Intimate partner violence    Fear of current or ex partner: Not on file    Emotionally abused: Not on file    Physically abused: Not on file    Forced sexual activity: Not on file  Other Topics Concern  . Not on file  Social History Narrative  . Not on file      Review of Systems  All other systems reviewed and are negative.      Objective:   Physical Exam  Constitutional: He appears well-developed and well-nourished. No distress.  HENT:  Right Ear: Tympanic membrane and ear canal normal. Tympanic membrane is not injected, not scarred, not perforated, not erythematous, not retracted and not bulging. No middle ear effusion.  Left Ear: Tympanic membrane and ear canal normal. Tympanic membrane is not injected, not  scarred, not perforated, not erythematous, not retracted and not bulging.  Nose: No mucosal edema, rhinorrhea, septal deviation or nasal septal hematoma. Right sinus exhibits no maxillary sinus  tenderness and no frontal sinus tenderness. Left sinus exhibits no maxillary sinus tenderness and no frontal sinus tenderness.  Mouth/Throat: Oropharynx is clear and moist.  Eyes: Pupils are equal, round, and reactive to light. Conjunctivae and EOM are normal. Right eye exhibits no discharge. Left eye exhibits no discharge. No scleral icterus.  Neck: Normal range of motion. Neck supple. No JVD present. No tracheal deviation present. No thyromegaly present.  Cardiovascular: Normal rate, regular rhythm, normal heart sounds and intact distal pulses. Exam reveals no gallop and no friction rub.  No murmur heard. Pulmonary/Chest: Effort normal and breath sounds normal. No stridor. No respiratory distress. He has no wheezes. He has no rales. He exhibits no tenderness.  Abdominal: Soft. Bowel sounds are normal. He exhibits no distension and no mass. There is no abdominal tenderness. There is no rebound and no guarding.  Musculoskeletal:        General: No edema.  Lymphadenopathy:    He has no cervical adenopathy.  Skin: Skin is warm. No rash noted. He is not diaphoretic. No erythema. No pallor.          Assessment & Plan:   The encounter diagnosis was Generalized abdominal pain. Patient's exam is completely normal.  I suspect abdominal pain secondary to constipation versus IBS.  I will try the patient on Amitiza 24 mcg p.o. twice daily samples and reassess via telephone in 1 week.  If symptoms are persistent, return for a CBC, CMP, lipase.  Consider x-rays of the abdomen.

## 2019-05-30 ENCOUNTER — Other Ambulatory Visit: Payer: Self-pay

## 2019-06-02 ENCOUNTER — Ambulatory Visit: Payer: PRIVATE HEALTH INSURANCE | Admitting: Family Medicine

## 2019-06-09 ENCOUNTER — Ambulatory Visit: Payer: PRIVATE HEALTH INSURANCE | Admitting: Family Medicine

## 2019-09-02 ENCOUNTER — Encounter: Payer: Self-pay | Admitting: Physician Assistant

## 2019-09-11 ENCOUNTER — Ambulatory Visit: Payer: Self-pay | Admitting: Physician Assistant

## 2019-09-11 ENCOUNTER — Ambulatory Visit (INDEPENDENT_AMBULATORY_CARE_PROVIDER_SITE_OTHER): Payer: Self-pay | Admitting: Physician Assistant

## 2019-09-11 ENCOUNTER — Other Ambulatory Visit: Payer: Self-pay

## 2019-09-11 DIAGNOSIS — U071 COVID-19: Secondary | ICD-10-CM

## 2019-09-11 NOTE — Progress Notes (Signed)
Patient not seen as he tested Positive for covid 09/02/19 and was rescheduled today.

## 2019-09-28 ENCOUNTER — Telehealth: Payer: Self-pay | Admitting: Gastroenterology

## 2019-09-28 NOTE — Telephone Encounter (Signed)
Received page to on-call 28 yo male with recurrent, seemingly mild abdominal pain. States has been present for 8 weeks or so (although similar sxs noted in 04/2019 on appt with PCM, with associated constipation at that time, and prescribed Linzess). Describes pain in b/l abdomen, typically worse with activity and laying prone on stomach. Sxs tend to be brief, and not a/w PO intake. No associated n/v/f/c/d/c.   He has an appt with Ellouise Newer tomorrow in the GI clinic already scheduled. He o/w has no "red flag" sxs to prompt ER evaluation this evening. Recently recovered from Covid. Recommended supportive care o/n, with APAP or Pepto for pain, rest, and to keep appt in GI Clinic tomorrow. All questions answered and he was appreciative of the phone call back.

## 2019-09-29 ENCOUNTER — Ambulatory Visit: Payer: Self-pay | Admitting: Physician Assistant

## 2019-09-29 ENCOUNTER — Other Ambulatory Visit: Payer: Self-pay

## 2019-09-29 ENCOUNTER — Telehealth: Payer: Self-pay | Admitting: Physician Assistant

## 2019-09-29 ENCOUNTER — Encounter: Payer: Self-pay | Admitting: Physician Assistant

## 2019-09-29 VITALS — BP 122/64 | HR 67 | Temp 97.6°F | Ht 67.0 in | Wt 135.2 lb

## 2019-09-29 DIAGNOSIS — R112 Nausea with vomiting, unspecified: Secondary | ICD-10-CM

## 2019-09-29 DIAGNOSIS — R1084 Generalized abdominal pain: Secondary | ICD-10-CM

## 2019-09-29 DIAGNOSIS — R12 Heartburn: Secondary | ICD-10-CM

## 2019-09-29 DIAGNOSIS — K5900 Constipation, unspecified: Secondary | ICD-10-CM

## 2019-09-29 MED ORDER — OMEPRAZOLE 40 MG PO CPDR: 40.0000 mg | DELAYED_RELEASE_CAPSULE | ORAL | 1 refills | Status: DC

## 2019-09-29 NOTE — Progress Notes (Signed)
Reviewed and agree with management plan.  Malcolm T. Stark, MD FACG [facility]  

## 2019-09-29 NOTE — Progress Notes (Signed)
Chief Complaint: Abdominal pain  HPI:    Mr. Nicholas Johnson is a 28 year old male with a past medical history as listed below, who was referred to me by Susy Frizzle, MD for a complaint of abdominal pain.      05/15/2019 patient seen by PCP and describes several months of discomfort in his abdomen which was generalized and felt like pressure.  Described gone to the bathroom 3 every 3 to 4 days which is typically hard and at one point saw some blood.  Was thought pain was secondary to IBS-C.  Patient was tried on Amitiza 24 mcg twice a day.    09/02/2019 patient tested positive for Covid.    Today, patient is a poor historian, explains that since July he has been having abdominal pain, explains that this comes and goes throughout the day but seems to be worse if he gets up and walks or if he tries to lay flat on his stomach.  Tells me he is to the point where this is making him anxious because he is not sure what is going on with his body.  Does describe using 3-4 Ibuprofen once or twice a day for a few months for frequent headaches and wonders if this could have bothered his stomach.  Also describes some indigestion and using Gas-X or Pepto which seems to help.  Also explains that he only has a bowel movement once every 3 to 4 days.  Apparently was tried on some stool softeners by his PCP but tells me this made no difference.  Has been trying to increase water intake ever since being diagnosed with Covid about a month ago.  Tells me he does not like to take pills on a regular basis.    Denies fever, chills, weight loss, anorexia, nausea or vomiting.  Past Medical History:  Diagnosis Date  . Nasal congestion   . Parotid gland pain     Past Surgical History:  Procedure Laterality Date  . WISDOM TOOTH EXTRACTION     25     No current outpatient medications on file.   No current facility-administered medications for this visit.     Allergies as of 09/29/2019  . (No Known Allergies)    History reviewed. No pertinent family history.  Social History   Socioeconomic History  . Marital status: Married    Spouse name: Not on file  . Number of children: Not on file  . Years of education: Not on file  . Highest education level: Not on file  Occupational History  . Not on file  Social Needs  . Financial resource strain: Not on file  . Food insecurity    Worry: Not on file    Inability: Not on file  . Transportation needs    Medical: Not on file    Non-medical: Not on file  Tobacco Use  . Smoking status: Never Smoker  . Smokeless tobacco: Never Used  Substance and Sexual Activity  . Alcohol use: No  . Drug use: Not Currently    Types: Marijuana    Comment: once in 3 years  . Sexual activity: Not on file  Lifestyle  . Physical activity    Days per week: Not on file    Minutes per session: Not on file  . Stress: Not on file  Relationships  . Social Herbalist on phone: Not on file    Gets together: Not on file    Attends religious service:  Not on file    Active member of club or organization: Not on file    Attends meetings of clubs or organizations: Not on file    Relationship status: Not on file  . Intimate partner violence    Fear of current or ex partner: Not on file    Emotionally abused: Not on file    Physically abused: Not on file    Forced sexual activity: Not on file  Other Topics Concern  . Not on file  Social History Narrative  . Not on file    Review of Systems:    Constitutional: No weight loss, fever or chills Skin: No rash  Cardiovascular: No chest pain Respiratory: No SOB  Gastrointestinal: See HPI and otherwise negative Genitourinary: No dysuria  Neurological: No headache Musculoskeletal: No new muscle or joint pain Hematologic: No bleeding  Psychiatric: No history of depression or anxiety   Physical Exam:  Vital signs: BP 122/64   Pulse 67   Temp 97.6 F (36.4 C)   Ht 5\' 7"  (1.702 m)   Wt 135 lb 3.2 oz (61.3  kg)   BMI 21.18 kg/m   Constitutional:   Pleasant AA male appears to be in NAD, Well developed, Well nourished, alert and cooperative Head:  Normocephalic and atraumatic. Eyes:   PEERL, EOMI. No icterus. Conjunctiva pink. Ears:  Normal auditory acuity. Neck:  Supple Throat: Oral cavity and pharynx without inflammation, swelling or lesion.  Respiratory: Respirations even and unlabored. Lungs clear to auscultation bilaterally.   No wheezes, crackles, or rhonchi.  Cardiovascular: Normal S1, S2. No MRG. Regular rate and rhythm. No peripheral edema, cyanosis or pallor.  Gastrointestinal:  Soft, nondistended, mild generalized ttp, some worse in epigastrum. No rebound or guarding. Normal bowel sounds. No appreciable masses or hepatomegaly. Rectal:  Not performed.  Msk:  Symmetrical without gross deformities. Without edema, no deformity or joint abnormality.  Neurologic:  Alert and  oriented x4;  grossly normal neurologically.  Skin:   Dry and intact without significant lesions or rashes. Psychiatric: Demonstrates good judgement and reason without abnormal affect or behaviors.  No recent labs or imaging.  Assessment: 1.  Abdominal pain: Generalized, though some worse in his epigastrium, consider relation to Ibuprofen usage and gastritis versus IBS 2.  Heartburn: Infrequent per the patient, but could be contributing to above 3.  Constipation: Bowel movement every 3 to 4 days, patient reports straining for stools; consider relation to abdominal pain, likely IBS as patient appears very anxious  Plan: 1.  Discussed EGD and colonoscopy with the patient but he would prefer to go with conservative measures first. 2.  Prescribed Omeprazole 40 mg daily, 30-60 minutes before breakfast #30 with 1 refill. 3.  Recommend the patient start MiraLAX once daily, titrated to a soft formed stool on a more regular basis. 4.  Explained to patient that if he does not feel any better within a week of being on these  medications then would recommend he call our office.  At that point would recommend a CT of the abdomen with contrast. 5.  Patient to follow in clinic with me in 3 to 4 weeks or sooner if necessary.  He was assigned to Dr. this morning.  Russella Dar, PA-C [facility] 09/29/2019, 10:33 AM  Cc: 14/04/2019, MD

## 2019-09-29 NOTE — Telephone Encounter (Signed)
Called patient and he is good with scheduling the CT-abd./pelvis w/contrast next week. Also OK with sending the appt. Information to his My Chart.

## 2019-09-29 NOTE — Telephone Encounter (Signed)
OK. Please order ct abdomen/pelvis with contrast for generalized abdominal pain. Thanks-JLL

## 2019-09-29 NOTE — Patient Instructions (Addendum)
If you are age 27 or older, your body mass index should be between 23-30. Your Body mass index is 21.18 kg/m. If this is out of the aforementioned range listed, please consider follow up with your Primary Care Provider.  If you are age 59 or younger, your body mass index should be between 19-25. Your Body mass index is 21.18 kg/m. If this is out of the aformentioned range listed, please consider follow up with your Primary Care Provider.   We have sent the following medications to your pharmacy for you to pick up at your convenience: Omeprazole  Start Miralax daily.  Follow up with me in three weeks.  Please call the office for an appointment in a week or so to schedule an appointment as the schedule is not available at this time.  Thank you for choosing me and [facility].    Ellouise Newer, PA-C

## 2019-10-08 ENCOUNTER — Telehealth: Payer: Self-pay

## 2019-10-08 ENCOUNTER — Ambulatory Visit (HOSPITAL_COMMUNITY)
Admission: RE | Admit: 2019-10-08 | Discharge: 2019-10-08 | Disposition: A | Discharge status: Home / Self Care | Payer: Self-pay | Source: Ambulatory Visit | Attending: Physician Assistant | Admitting: Physician Assistant

## 2019-10-08 ENCOUNTER — Other Ambulatory Visit: Payer: Self-pay

## 2019-10-08 ENCOUNTER — Encounter: Payer: Self-pay | Admitting: Family Medicine

## 2019-10-08 ENCOUNTER — Encounter (HOSPITAL_COMMUNITY): Payer: Self-pay

## 2019-10-08 DIAGNOSIS — R1084 Generalized abdominal pain: Secondary | ICD-10-CM | POA: Insufficient documentation

## 2019-10-08 MED ORDER — IOHEXOL 300 MG/ML  SOLN
100.0000 mL | Freq: Once | INTRAMUSCULAR | Status: AC | PRN
  Administered 2019-10-08: 100 mL via INTRAVENOUS

## 2019-10-08 MED ORDER — SODIUM CHLORIDE (PF) 0.9 % IJ SOLN
INTRAMUSCULAR | Status: AC
  Filled 2019-10-08: qty 50

## 2019-10-08 NOTE — Telephone Encounter (Signed)
See result note.  

## 2019-10-08 NOTE — Telephone Encounter (Signed)
Left message for patient to call back  

## 2019-10-09 ENCOUNTER — Telehealth: Payer: Self-pay | Admitting: Physician Assistant

## 2019-10-09 NOTE — Telephone Encounter (Signed)
Returned patient's call, he wanted to know it the CT showed all the way though to his spine. I told him it should show some of a view, but if he is having back pain/problems he should check with his PCP and have follow-up specific for his back

## 2019-10-10 ENCOUNTER — Telehealth: Payer: Self-pay | Admitting: Physician Assistant

## 2019-10-10 NOTE — Telephone Encounter (Signed)
The pt called and asked if we could do a CT scan of his chest.  He never gave a clear reason for wanting the CT.  I did advise him to call his PCP and discuss any concerns he may have.  The pt has been advised of the information and verbalized understanding.

## 2019-10-14 ENCOUNTER — Ambulatory Visit (INDEPENDENT_AMBULATORY_CARE_PROVIDER_SITE_OTHER): Payer: PRIVATE HEALTH INSURANCE | Admitting: Family Medicine

## 2019-10-14 ENCOUNTER — Encounter: Payer: Self-pay | Admitting: Family Medicine

## 2019-10-14 ENCOUNTER — Other Ambulatory Visit: Payer: Self-pay

## 2019-10-14 VITALS — BP 126/68 | HR 78 | Temp 97.0°F | Resp 18 | Ht 67.0 in | Wt 129.0 lb

## 2019-10-14 DIAGNOSIS — R079 Chest pain, unspecified: Secondary | ICD-10-CM

## 2019-10-14 NOTE — Telephone Encounter (Signed)
Pt called back to discuss process of CT.  He wanted to know if it usually is ordered by a PCP.

## 2019-10-14 NOTE — Progress Notes (Signed)
Subjective:    Patient ID: Nicholas OverallPeter J Ridling Jr., male    DOB: 05/10/1991, 28 y.o.   MRN: [id number]  HPI5/8/14 Patient presents with a one-year history of pressure-like sensation at the angle of his right mandible near the parotid gland. He also complains of a "obstruction" between his ear and his nose. He states he hears a cracking or popping sensation in his ear. He has some mild hearing loss in that ear.  Pain began a couple years ago. 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 It was a sharp burning pain which passed quickly but he continues to have an uncomfortable pressure-like sensation that area.  I reviewed the records from his ENT doctor, as well as a CT scan of his neck, and an office visit with his dentist. 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  The CT scan was clear of parotid gland mass, or salivary gland obstructions.  The sinuses are also clear .  He has also seen a neurologist who tried empiric therapy for trigeminal neuralgia with gabapentin which did not help.  At that time, my plan was: The patient has a very interesting history. The obstruction sensation he describes sounds a bit like eustachian tube dysfunction. I'm going to try the patient on Flonase 2 sprays each nostril inhaled daily and Zyrtec 10 mg by mouth daily. We will see if this alleviates some of eustachian tube dysfunction and clears up this obstruction or pressure-like sensation in his right sinuses and nasal cavity. The pressure-like sensation that he feels near the angle of his right mandible could possibly be trigeminal neuralgia, parotid gland pain, postnasal drip. I will see if empiric treatment improves this as well.  He is to follow with me in one month.  05/13/13 Patient is here for recheck. He saw no benefit from Flonase to Zyrtec. He no longer complains of pain in or around his parotid gland.  However he continues to complain of a pressure behind his right eardrum. He complains of popping  and cracking in his right ear whenever he coughs or sneezes. He complains of decreased hearing in his right ear. He denies any pain in his right ear. He denies any rhinorrhea or congestion. He denies any sneezing or sinus pain. At that time, my plan was: 1. Eustachian tube dysfunction, right Patient would like to see a different ENT doctor for a second opinion.  I believe his symptoms are consistent with eustachian tube dysfunction and the patient may benefit from pressure equalization tubes. I would recommend tympanometry and possibly a CT scan of the sinuses but I would defer this to ENT. I will consult Dr. Suszanne Connerseoh for second opinion. - Ambulatory referral to ENT  06/26/13 Patient saw Dr. Suszanne Connerseoh.  He recommended a prednisone Dosepak and Valsalva maneuvers for eustachian tube dysfunction. Unfortunately the patient has not experienced any improvement. Furthermore he continues to complain of a pressure like sensation in the back of his throat at the level of the angle of the mandible. It is deep to the right side of his throat. He had a CT scan of the head and neck which was negative. He's also had a laryngoscopy performed by ENT which was negative. The symptoms have been present for over 2 years with no significant sequela. Unfortunately they do not seem to be improving.  At that time, my plan was: I recommended the patient followup with Dr. Suszanne Connerseoh as planned to discuss further intervention for his  eustachian tube dysfunction. Unfortunately I do not know what else to do or to try to treat the globus sensation in his throat. 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 I do not feel that it is related to reflux or psychiatric in nature. Therefore I will not charge the patient a copay today. I apologized to the patient but I do not know anything else that can be tried. I do believe the problem is benign and not life-threatening.     05/17/18 Patient has not been  seen in 5 years.  However he is here today reporting similar symptoms to those reported 5 years ago.  He reports a pressure like sensation below his ear near the angle of the mandible particularly on the right-hand side.  At times he reports it is a discomfort.  He also reports decreased hearing.  He reports a popping sensation in his ears.  He states that this occurs off and on and at times it is worse than at other times.  Today a hearing screen was performed today in the office and it is normal.  Both tympanic membranes appear healthy with no middle ear effusion.  There is no palpable abnormality in the neck near the angle of the mandible.  There is no lymphadenopathy.  There is no palpable masses.  Examination of the posterior oropharynx reveals no abnormality.  I reviewed the patient's CT scans from 5 years ago which were reassuring and normal.  At that time, my plan was: I can see or find no abnormality on his exam.  I try to reassure the patient that these are similar symptoms to what he experienced 5 years ago.  I do not believe there is anything life-threatening or dangerous.  However the patient states he is bothered by the symptoms and would like a second opinion.  He would therefore like me to have him see another ear nose and throat physician for a second opinion.  Patient's affect is unusual today.  He seems very anxious.  I discussed with him whether or not anxiety could be playing a role in this unusual globus-like sensation that he is experiencing.  He does not believe so.  Therefore I will consult ENT for a second opinion per the patient's request.  I tried to assuage his fears as best I could however I see nothing amenable to treatment today on his exam.  07/08/18 Patient has seen ENT and they have recommended trying a nasal steroid spray for possible eustachian tube dysfunction.  He is yet to pick up the spray and try it.  He is here today for complete physical exam.  He is due for a flu shot  as well as a tetanus shot which he politely declines.  He is also due for HIV screening which he consents to.  He also would like regular lab work including his cholesterol and blood sugar to be checked.  His review of systems is otherwise negative.  He denies any chest pain shortness of breath dyspnea on exertion.  He denies any cough, hemoptysis, shortness of breath.  He denies any nausea vomiting diarrhea.  He denies any headache or vision changes.  He denies any joint pains, rashes, intestinal problems, polyuria, polydipsia, dysuria, hematuria.  05/15/19 Patient presents today with several months of discomfort in his abdomen.  The discomfort is not localized.  It is a generalized pressure sensation.  He draws a circle on his  belly with his hand from below the xiphoid process all the way to the suprapubic area.  He states that it feels tight at times.  It is worse if he bends forward.  There is no visible hernia.  I had the patient perform Valsalva and there is no palpable hernia.  I had the patient perform a sit up while lying on the exam table against resistance.  This does not exacerbate the pain nor does it highlight a hernia.  Patient denies any fever or chills.  He denies any nausea or vomiting.  He denies any diarrhea.  He denies any melena or hematochezia however he does state that he has had a difficult time going to the restroom.  He states that he goes to the bathroom every 3 to 4 days.  Is typically hard.  At one point he had some mild blood when he had a bowel movement due to the straining of having the bowel movement.  He denies any weight loss.  At that time, my plan was: Patient's exam is completely normal.  I suspect abdominal pain secondary to constipation versus IBS.  I will try the patient on Amitiza 24 mcg p.o. twice daily samples and reassess via telephone in 1 week.  If symptoms are persistent, return for a CBC, CMP, lipase.  Consider x-rays of the abdomen.  10/14/19 Ultimately saw GI  who ordered a CT scan of the abd and pelvis which was normal except for a large stool burden.  Patient states that his symptoms have changed since he saw GI.  He states that he may not have described his symptoms adequately.  He now describes a bandlike pressure in his chest.  This is roughly at the level of the xiphoid process.  The patient demonstrates by radiating his arms in a belt-like fashion around his lower chest upper abdomen area.  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  He also states that hurts when he lays on it at night.  Symptoms can vary in duration.  He states that they typically last a long time.  Sometimes he states that they last the entire day.  He denies any melena.  He denies any hematochezia.  He denies any association of the pain with food.  GI originally started him on omeprazole.  Patient states that he took the medication for a few days however it does not appear that he is taking it still.  However the pain does not seem to be exacerbated by food.  He denies any pain in his right upper quadrant to palpation.  He has a negative Murphy sign today on exam.  There is no evidence of jaundice on his exam.  Although not ideal to evaluate for gallstones, there was no mention of gallstones seen on CT scan and given his age, I believe biliary colic would be unlikely.  Patient denies any exacerbation of the pain with aerobic exercise.  He denies any shortness of breath.  He denies any hemoptysis.  He denies any fevers or chills. Past Medical History:  Diagnosis Date  . Nasal congestion   . Parotid gland pain    Current Outpatient Medications on File Prior to Visit  Medication Sig Dispense Refill  . omeprazole (PRILOSEC) 40 MG capsule Take 1 capsule (40 mg total) by mouth every morning. Take 30-60 minutes before breakfast. 30 capsule 1   No current facility-administered medications on file prior to visit.  No Known  Allergies Social History   Socioeconomic History  . Marital status: Single    Spouse name: Not on file  . Number of children: Not on file  . Years of education: Not on file  . Highest education level: Not on file  Occupational History  . Not on file  Tobacco Use  . Smoking status: Never Smoker  . Smokeless tobacco: Never Used  Substance and Sexual Activity  . Alcohol use: No  . Drug use: Not Currently    Types: Marijuana    Comment: once in 3 years  . Sexual activity: Not on file  Other Topics Concern  . Not on file  Social History Narrative  . Not on file   Social Determinants of Health   Financial Resource Strain:   . Difficulty of Paying Living Expenses: Not on file  Food Insecurity:   . Worried About Programme researcher, broadcasting/film/videounning Out of Food in the Last Year: Not on file  . Ran Out of Food in the Last Year: Not on file  Transportation Needs:   . Lack of Transportation (Medical): Not on file  . Lack of Transportation (Non-Medical): Not on file  Physical Activity:   . Days of Exercise per Week: Not on file  . Minutes of Exercise per Session: Not on file  Stress:   . Feeling of Stress : Not on file  Social Connections:   . Frequency of Communication with Friends and Family: Not on file  . Frequency of Social Gatherings with Friends and Family: Not on file  . Attends Religious Services: Not on file  . Active Member of Clubs or Organizations: Not on file  . Attends BankerClub or Organization Meetings: Not on file  . Marital Status: Not on file  Intimate Partner Violence:   . Fear of Current or Ex-Partner: Not on file  . Emotionally Abused: Not on file  . Physically Abused: Not on file  . Sexually Abused: Not on file      Review of Systems  All other systems reviewed and are negative.      Objective:   Physical Exam  Constitutional: He appears well-developed and well-nourished. No distress.  HENT:  Right Ear: Tympanic membrane and ear canal normal. Tympanic membrane is not  injected, not scarred, not perforated, not erythematous, not retracted and not bulging. No middle ear effusion.  Left Ear: Tympanic membrane and ear canal normal. Tympanic membrane is not injected, not scarred, not perforated, not erythematous, not retracted and not bulging.  Nose: No mucosal edema, rhinorrhea, septal deviation or nasal septal hematoma. Right sinus exhibits no maxillary sinus tenderness and no frontal sinus tenderness. Left sinus exhibits no maxillary sinus tenderness and no frontal sinus tenderness.  Eyes: Conjunctivae are normal. Right eye exhibits no discharge. Left eye exhibits no discharge. No scleral icterus.  Neck: No tracheal deviation present. No thyromegaly present.  Cardiovascular: Normal rate, regular rhythm, normal heart sounds and intact distal pulses. Exam reveals no gallop and no friction rub.  No murmur heard. Pulmonary/Chest: Effort normal and breath sounds normal. No stridor. No respiratory distress. He has no wheezes. He has no rales. He exhibits no tenderness, no bony tenderness, no deformity and no swelling.    Abdominal: Soft. Bowel sounds are normal. He exhibits no distension and no mass. There is no abdominal tenderness. There is no rebound and no guarding.  Musculoskeletal:     Cervical back: Neck supple.  Lymphadenopathy:    He has no cervical adenopathy.  Skin: He  is not diaphoretic. No pallor.          Assessment & Plan:  Chest pain at rest - Plan: CBC with Differential, COMPLETE METABOLIC PANEL WITH GFR, Lipase, DG Chest 2 View  We had a long discussion today more than 35 minutes.  Patient symptoms do not sound cardiac in nature.  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  Previous history is significant for previous hospitalization for psychosis.  He has also had an extensive  diagnostic work-up for discomfort in the right side of his neck.  Evaluation from gastroenterology revealed no abnormalities aside from constipation.  Symptoms sound like potential muscle spasms.  The other possibility would be potentially stress and anxiety causing tightness in the chest.  I explained to the patient this is what I feel may be the most likely explanation given his past medical history and the duration of symptoms for more than 2 years.  I believe that 2 years worth of symptoms makes a life-threatening etiology unlikely given the fact the symptoms have not progressed in the last 2 years and seem to be migratory in nature.  Therefore I would focus on trying to manage stress and anxiety better potentially with seeing a psychiatrist.  Patient seems very resistant to this as he does not believe that this is anxiety related.  I do not feel a CT scan of the chest is warranted at this time however in an effort to be thorough I do recommend a chest x-ray as this would rule out a mediastinal mass or lymphadenopathy in the chest.  Also recommended a CBC to evaluate for any leukocytosis or anemia to suggest GI blood loss from possibly an ulcer.  Check a CMP to evaluate for any elevations in liver function test or bilirubin to suggest biliary tract abnormalities.  Check a lipase to rule out pancreatitis although I feel that this is highly unlikely given the patient's normal abdominal exam

## 2019-10-15 LAB — COMPLETE METABOLIC PANEL WITH GFR
AG Ratio: 1.8 (calc) (ref 1.0–2.5)
ALT: 10 U/L (ref 9–46)
AST: 17 U/L (ref 10–40)
Albumin: 4.9 g/dL (ref 3.6–5.1)
Alkaline phosphatase (APISO): 54 U/L (ref 36–130)
BUN: 14 mg/dL (ref 7–25)
CO2: 28 mmol/L (ref 20–32)
Calcium: 9.9 mg/dL (ref 8.6–10.3)
Chloride: 103 mmol/L (ref 98–110)
Creat: 1.02 mg/dL (ref 0.60–1.35)
GFR, Est African American: 116 mL/min/{1.73_m2} (ref >=60)
GFR, Est Non African American: 100 mL/min/{1.73_m2} (ref >=60)
Globulin: 2.8 g/dL (calc) (ref 1.9–3.7)
Glucose, Bld: 83 mg/dL (ref 65–99)
Potassium: 4.4 mmol/L (ref 3.5–5.3)
Sodium: 141 mmol/L (ref 135–146)
Total Bilirubin: 0.3 mg/dL (ref 0.2–1.2)
Total Protein: 7.7 g/dL (ref 6.1–8.1)

## 2019-10-15 LAB — CBC WITH DIFFERENTIAL/PLATELET
Absolute Monocytes: 391 cells/uL (ref 200–950)
Basophils Absolute: 22 cells/uL (ref 0–200)
Basophils Relative: 0.4 %
Eosinophils Absolute: 11 cells/uL — ABNORMAL LOW (ref 15–500)
Eosinophils Relative: 0.2 %
HCT: 44.9 % (ref 38.5–50.0)
Hemoglobin: 15.1 g/dL (ref 13.2–17.1)
Lymphs Abs: 1425 cells/uL (ref 850–3900)
MCH: 28.2 pg (ref 27.0–33.0)
MCHC: 33.6 g/dL (ref 32.0–36.0)
MCV: 83.9 fL (ref 80.0–100.0)
MPV: 11 fL (ref 7.5–12.5)
Monocytes Relative: 7.1 %
Neutro Abs: 3652 cells/uL (ref 1500–7800)
Neutrophils Relative %: 66.4 %
Platelets: 191 10*3/uL (ref 140–400)
RBC: 5.35 10*6/uL (ref 4.20–5.80)
RDW: 12.1 % (ref 11.0–15.0)
Total Lymphocyte: 25.9 %
WBC: 5.5 10*3/uL (ref 3.8–10.8)

## 2019-10-15 LAB — LIPASE: Lipase: 7 U/L (ref 7–60)

## 2019-11-11 ENCOUNTER — Other Ambulatory Visit: Payer: Self-pay

## 2019-11-11 ENCOUNTER — Encounter: Payer: Self-pay | Admitting: Family Medicine

## 2019-11-11 ENCOUNTER — Ambulatory Visit (INDEPENDENT_AMBULATORY_CARE_PROVIDER_SITE_OTHER): Payer: Self-pay | Admitting: Family Medicine

## 2019-11-11 VITALS — BP 112/72 | HR 80 | Temp 96.9°F | Resp 16 | Ht 67.0 in | Wt 128.0 lb

## 2019-11-11 DIAGNOSIS — S46312A Strain of muscle, fascia and tendon of triceps, left arm, initial encounter: Secondary | ICD-10-CM

## 2019-11-11 MED ORDER — DICLOFENAC SODIUM 75 MG PO TBEC: 75.0000 mg | DELAYED_RELEASE_TABLET | Freq: Two times a day (BID) | ORAL | 0 refills | Status: DC

## 2019-11-11 NOTE — Progress Notes (Signed)
Subjective:    Patient ID: Nicholas Johnson., male    DOB: 26-Jun-1991, 29 y.o.   MRN: [id number]  HPI Over the last 2 weeks, the patient has been working out.  He has been lifting weights including bench press, free weights, curls etc.  Recently he has developed pain proximal to the left elbow.  The insertion of the triceps and the tendon itself is tender to palpation just behind the medial epicondyle.  Patient has pain with resisted elbow extension.  There is no tenderness to palpation over the medial epicondyle or the wrist flexor compartment.  There is no tenderness over the lateral epicondyle or the extensor compartment.  There is no tenderness to palpation over the olecranon process.  There is no bursitis.  There is no palpable defect in the triceps and the patient's muscle strength is 5/5 equal and symmetric in both upper extremities. Past Medical History:  Diagnosis Date  . Nasal congestion   . Parotid gland pain    Past Surgical History:  Procedure Laterality Date  . WISDOM TOOTH EXTRACTION     25    No current outpatient medications on file prior to visit.   No current facility-administered medications on file prior to visit.   No Known Allergies Social History   Socioeconomic History  . Marital status: Single    Spouse name: Not on file  . Number of children: Not on file  . Years of education: Not on file  . Highest education level: Not on file  Occupational History  . Not on file  Tobacco Use  . Smoking status: Never Smoker  . Smokeless tobacco: Never Used  Substance and Sexual Activity  . Alcohol use: No  . Drug use: Not Currently    Types: Marijuana    Comment: once in 3 years  . Sexual activity: Not on file  Other Topics Concern  . Not on file  Social History Narrative  . Not on file   Social Determinants of Health   Financial Resource Strain:   . Difficulty of Paying Living Expenses: Not on file  Food Insecurity:   . Worried About Charity fundraiser  in the Last Year: Not on file  . Ran Out of Food in the Last Year: Not on file  Transportation Needs:   . Lack of Transportation (Medical): Not on file  . Lack of Transportation (Non-Medical): Not on file  Physical Activity:   . Days of Exercise per Week: Not on file  . Minutes of Exercise per Session: Not on file  Stress:   . Feeling of Stress : Not on file  Social Connections:   . Frequency of Communication with Friends and Family: Not on file  . Frequency of Social Gatherings with Friends and Family: Not on file  . Attends Religious Services: Not on file  . Active Member of Clubs or Organizations: Not on file  . Attends Archivist Meetings: Not on file  . Marital Status: Not on file  Intimate Partner Violence:   . Fear of Current or Ex-Partner: Not on file  . Emotionally Abused: Not on file  . Physically Abused: Not on file  . Sexually Abused: Not on file      Review of Systems  All other systems reviewed and are negative.      Objective:   Physical Exam Vitals reviewed.  Constitutional:      Appearance: Normal appearance. He is normal weight. He is not ill-appearing or  toxic-appearing.  Cardiovascular:     Rate and Rhythm: Normal rate and regular rhythm.     Heart sounds: Normal heart sounds.  Pulmonary:     Effort: Pulmonary effort is normal.     Breath sounds: Normal breath sounds.  Musculoskeletal:     Left upper arm: Tenderness present. No swelling, edema, deformity or bony tenderness.     Left elbow: Normal. No swelling, deformity, effusion or lacerations. Normal range of motion. No tenderness.       Arms:  Neurological:     Mental Status: He is alert.           Assessment & Plan:  Triceps strain, left, initial encounter  I believe the patient has strained his triceps.  I have recommended rest for the next 2 weeks.  Patient can work on cardio but I recommended that he stay away from arms and free weights and bench press.  He can use  diclofenac 75 mg twice a day as needed for pain.  Of also recommended that he ice the inflamed areas for 5 to 10 minutes 2-3 times a day.

## 2019-12-01 ENCOUNTER — Telehealth: Payer: Self-pay | Admitting: Physician Assistant

## 2019-12-01 NOTE — Telephone Encounter (Signed)
Patient called and states he is still having the same abdominal pain. He has an office visit with Dr. Russella Dar on 12/12/19, but wants to know if there Is anything he can take in the meantime to help with the pain

## 2019-12-02 ENCOUNTER — Telehealth: Payer: Self-pay

## 2019-12-02 ENCOUNTER — Other Ambulatory Visit: Payer: Self-pay

## 2019-12-02 MED ORDER — OMEPRAZOLE 40 MG PO CPDR: 40.0000 mg | DELAYED_RELEASE_CAPSULE | Freq: Two times a day (BID) | ORAL | 0 refills | Status: DC

## 2019-12-02 NOTE — Telephone Encounter (Signed)
Left message to please call back. °

## 2019-12-02 NOTE — Telephone Encounter (Signed)
He can try increasing his Omeprazole to 40mg  BID to see if this helps-as long as the pain is still in his upper abdomen.  JLL

## 2019-12-02 NOTE — Telephone Encounter (Signed)
Patient called back and I let him know Hyacinth Meeker PA suggested Omeprazole-40mg  BID for his pain, but only IF it is in his upper abdomin. He said he really couldn't tell where it was coming from, that he hadn't paid close attention.I asked him to pay close attention today to where the pain is. And IF it is the upper abdomin, to get it filled. And if not the upper abdominal area, Not to pick it up. I sent in enough Omeprazole to patient's pharmacy to get him through to his office visit on 12/12/19

## 2019-12-12 ENCOUNTER — Ambulatory Visit: Payer: Self-pay | Admitting: Gastroenterology

## 2019-12-31 ENCOUNTER — Ambulatory Visit: Payer: Self-pay | Admitting: Physician Assistant

## 2020-01-07 ENCOUNTER — Ambulatory Visit: Payer: 59 | Admitting: Nurse Practitioner

## 2020-01-07 ENCOUNTER — Other Ambulatory Visit: Payer: Self-pay

## 2020-01-07 ENCOUNTER — Encounter: Payer: Self-pay | Admitting: Nurse Practitioner

## 2020-01-07 VITALS — BP 110/70 | HR 68 | Temp 97.0°F | Ht 67.0 in | Wt 134.0 lb

## 2020-01-07 DIAGNOSIS — R1010 Upper abdominal pain, unspecified: Secondary | ICD-10-CM

## 2020-01-07 DIAGNOSIS — R110 Nausea: Secondary | ICD-10-CM

## 2020-01-07 DIAGNOSIS — K5900 Constipation, unspecified: Secondary | ICD-10-CM

## 2020-01-07 NOTE — Progress Notes (Signed)
Reviewed and agree with management plan.  Kie Calvin T. Heidee Audi, MD FACG [facility]  

## 2020-01-07 NOTE — Progress Notes (Signed)
IMPRESSION and PLAN:    # Constipation  --Miralax not helpful --Trial of Metamucil twice daily -- 64 ounces daily  --colace two at HS  # Upper abdominal discomfort --Negative CT scan, normal labs.  --Patient thinks pain could be muscular.  When the pain started he had been doing heavy lifting at work and also lifting weights at the gym .  Patient thinks he needs a new mattress.  --Pain may be muscular.  He is not taking the PPI we prescribed at the last visit.  Trial of Salonpas patches --Need to treat constipation  --Offered EGD to rule out GI pathology especially given the nausea but he wants to hold off.  --Follow-up with me in 3 to 4 weeks   HPI:    Primary GI:  Dr. Russella Dar  Chief complaint : abdominal pain  Nicholas Johnson is a 29 year old male who established care here early December, he saw Nicholas Johnson, [place] for evaluation of generalized abdominal pain (generalized upper abdomen) and constipation.  Ibuprofen had not helped his pain.  EGD and colonoscopy were discussed, patient preferred to treat conservatively.  He was prescribed omeprazole and MiraLAX.  CT scan was discussed. Patient called the office a couple days later wanting to proceed with a CT scan which showed a large colonic stool burden, no explanation for abdominal pain.  CMP and CBC as well as lipase were normal in December.   Nicholas Johnson took MiraLAX 2-3 times a day for 1.5 weeks and stopped it because he did not feel like it helped.  He isn't keeping track of bowel movements but thinks he may be constipated.  Stools sometimes hard.   Nicholas Johnson still has intermittent upper abdominal discomfort though it has improved.  Pain seems to be most prominent upon waking up in the morning or when walking. He stopped lifting weights and thinks that has helped the pain. His back is tight. Patient thinks he may need a new mattress.  He feels like abdominal and back pain may be muscular.   Nicholas Johnson complains of postprandial nausea 3-4 times a  week. No vomiting and his weight is stable. He says the nausea has been present for several months now  Patient complains of nasal congestion, tension headaches. Saw ENT years ago for similar symptoms and prescribed nasal spray  Data Reviewed:  10/14/19 CMP normal CBC normal.   Review of systems:     Positive for sinus congestion. No chest pain, No fevers, no urinary sx   Past Medical History:  Diagnosis Date  . Nasal congestion   . Parotid gland pain     Patient's surgical history, family medical history, social history, medications and allergies were all reviewed in Epic   Creatinine clearance cannot be calculated (Patient's most recent lab result is older than the maximum 21 days allowed.)  No current outpatient medications on file.   No current facility-administered medications for this visit.    Physical Exam:     BP 110/70   Pulse 68   Temp (!) 97 F (36.1 C)   Ht 5\' 7"  (1.702 m)   Wt 134 lb (60.8 kg)   BMI 20.99 kg/m   GENERAL:  Pleasant male in NAD PSYCH: : Cooperative, normal affect CARDIAC:  RRR, no murmur heard, no peripheral edema PULM: Normal respiratory effort, lungs CTA bilaterally, no wheezing ABDOMEN:  Nondistended, soft, nontender. No obvious masses, no hepatomegaly,  normal bowel sounds SKIN:  turgor, no lesions seen Musculoskeletal:  Normal muscle tone, normal  strength NEURO: Alert and oriented x 3, no focal neurologic deficits  I spent 30 minutes total reviewing records, obtaining history, performing exam, counseling patient and documenting visit / findings.   Tye Savoy , NP 01/07/2020, 9:55 AM

## 2020-01-07 NOTE — Patient Instructions (Signed)
If you are age 28 or older, your body mass index should be between 23-30. Your Body mass index is 20.99 kg/m. If this is out of the aforementioned range listed, please consider follow up with your Primary Care Provider.  If you are age 47 or younger, your body mass index should be between 19-25. Your Body mass index is 20.99 kg/m. If this is out of the aformentioned range listed, please consider follow up with your Primary Care Provider.   Use Salonpas patches as directed  Metamucil twice daily  Colace 2 tablets at bed time  Drink at least 64 oz of water daily.  Follow up in 3-4 weeks

## 2020-01-13 ENCOUNTER — Ambulatory Visit: Payer: Self-pay | Admitting: Gastroenterology

## 2020-01-28 ENCOUNTER — Ambulatory Visit: Payer: 59 | Admitting: Nurse Practitioner

## 2020-02-04 ENCOUNTER — Telehealth: Payer: Self-pay | Admitting: Family Medicine

## 2020-02-04 NOTE — Telephone Encounter (Signed)
Patient called in with c/o light headedness and feeling like he was going to pass out while driving. Patient states that he is a [employer] and he is currently working and feels really light headed. States that he has been drinking water and trying to rest but continues to have the symptoms. I advised patient that he needs to be evaluated. Offered an appointment but patient declined. Advised that he should go to the ER asap for symptoms. Patient verbalized understanding.

## 2020-02-06 ENCOUNTER — Other Ambulatory Visit: Payer: Self-pay

## 2020-02-06 ENCOUNTER — Ambulatory Visit
Admission: RE | Admit: 2020-02-06 | Discharge: 2020-02-06 | Disposition: A | Discharge status: Home / Self Care | Payer: 59 | Source: Ambulatory Visit | Attending: Family Medicine | Admitting: Family Medicine

## 2020-02-06 DIAGNOSIS — R079 Chest pain, unspecified: Secondary | ICD-10-CM

## 2020-02-10 ENCOUNTER — Ambulatory Visit (INDEPENDENT_AMBULATORY_CARE_PROVIDER_SITE_OTHER): Payer: 59 | Admitting: Nurse Practitioner

## 2020-02-10 ENCOUNTER — Encounter: Payer: Self-pay | Admitting: Nurse Practitioner

## 2020-02-10 VITALS — BP 108/60 | HR 58 | Temp 97.2°F | Ht 66.75 in | Wt 127.0 lb

## 2020-02-10 DIAGNOSIS — R1084 Generalized abdominal pain: Secondary | ICD-10-CM

## 2020-02-10 DIAGNOSIS — K5900 Constipation, unspecified: Secondary | ICD-10-CM

## 2020-02-10 MED ORDER — METHOCARBAMOL 500 MG PO TABS: 500.0000 mg | ORAL_TABLET | Freq: Every day | ORAL | 0 refills | Status: DC

## 2020-02-10 NOTE — Patient Instructions (Signed)
If you are age 28 or older, your body mass index should be between 23-30. Your Body mass index is 20.04 kg/m. If this is out of the aforementioned range listed, please consider follow up with your Primary Care Provider.  If you are age 16 or younger, your body mass index should be between 19-25. Your Body mass index is 20.04 kg/m. If this is out of the aformentioned range listed, please consider follow up with your Primary Care Provider.   Take Metamucil daily as directed.  Take 2 colace tablets at bedtime.  Take methocarbamol 500 mg 1 tablet at bedtime for days. This has been sent to your pharmacy.  Call the office 7-10 days if you are not doing better.

## 2020-02-10 NOTE — Progress Notes (Signed)
IMPRESSION and PLAN:    # Chronic generalized abdominal pain / constipation --Normal CT scan  abd/ pelvis with contrast --Normal CMP, lipase and CBC --Overall his weight is stable, fluctuates --Worried about PUD, recalls frequent pain in LUQ when I showed him where stomach is situated in abdomen.  --Pain is possibly musculoskeletal. Constipation could be contributing. I do think anxiety is also a contributing factor. I tried to reassure him.  --He inquired about MRI - I don't see an indication for this.  --He agrees to take Metamucil BID and two colace daily for next several days. Will give 5 day trial of Robaxin to take at night.  If no improvement in pain with muscle relaxer and or resolution of constipation then will proceed with EGD though I explained that it would likely be a low yield.  --He will call us in 7 to 10 days with an update.      HPI:    Primary GI: Dr. Fuller Plan  Chief complaint : abdominal pain, back pain, constipation  Nicholas Johnson is a 29 year old male who established care here in December at which time he was evaluated for generalized abdominal pain, GERD and constipation.   Patient was seen by Ellouise Newer, P.A who noted that he was anxious due to lack of diagnosis. He had been taking NSAIDS so there was at least some concern for gastritis / PUD. Started on PPI and miralax.  EGD and colonoscopy were discussed but patient wanted to defer so the next step if he had persistent symptoms was going to be CT scan.  After leaving the office patient called back the same day and wanted to proceed with a CT scan in lieu of waiting for a while.  CT scan showed a large stool burden.   Patient came in 01/07/2020, this time to see me.  He had complaints of several months of postprandial nausea and ongoing constipation.  He was having upper abdominal discomfort at the time as well but thought it was muscular.  He was not taking the PPI we had prescribed at the previous visit. He  tried MiraLAX but it didn't help.   I started him on Metamucil twice daily, 64 ounces of water daily and 2 Colace at bedtime.  Offered EGD for evaluation of upper abdominal pain as well as nausea but he wanted to hold off.  He is here for follow-up as I requested.   [name] is still having problems with constipation.  He admits to forgetting to take Colace and Metamucil on a daily basis.  He plans to set an alarm on his phone as a reminder to take these things.  He has a bowel movement a couple times a week.  He complains of ongoing abdominal pain.  Says the pain is all over his abdomen and even into his back and it is difficult to pinpoint.  He asked about the possibility of a stomach ulcer.  Following that, he was able to recall that he does tend to have a lot of pain in the LUQ.  He is physically active at work and thinks musculoskeletal strain could be causing some of his pain.  However, he had a lot of this pain prior to starting his job.  The pain encompasses the whole abdomen, he points to both flanks.  His pain is not better nor worse with eating.  He really does not know if there is any improvement in pain after defecation  but will keep track of that.  He pulled up CT scan on my chart.  He inquires about getting an MRI.   Review of systems:     No chest pain, no SOB, no fevers, no urinary sx   Past Medical History:  Diagnosis Date  . Nasal congestion   . Parotid gland pain     Patient's surgical history, family medical history, social history, medications and allergies were all reviewed in Epic   Creatinine clearance cannot be calculated (Patient's most recent lab result is older than the maximum 21 days allowed.)  Current Outpatient Medications  Medication Sig Dispense Refill  . docusate sodium (COLACE) 100 MG capsule Take 200 mg by mouth at bedtime as needed for mild constipation.    . polyethylene glycol (MIRALAX / GLYCOLAX) 17 g packet Take 17 g by mouth daily as needed.     No  current facility-administered medications for this visit.    Physical Exam:     BP 108/60   Pulse (!) 58   Temp (!) 97.2 F (36.2 C)   Ht 5' 6.75" (1.695 m)   Wt 127 lb (57.6 kg)   BMI 20.04 kg/m   GENERAL:  Pleasant thin male in NAD PSYCH: : Cooperative, normal affect CARDIAC:  RRR, no murmur heard, no peripheral edema PULM: Normal respiratory effort, lungs CTA bilaterally, no wheezing ABDOMEN:  Nondistended, soft, nontender. No obvious masses, no hepatomegaly,  normal bowel sounds SKIN:  turgor, no lesions seen Musculoskeletal:  Normal muscle tone, normal strength NEURO: Alert and oriented x 3, no focal neurologic deficits  I spent 30 minutes total reviewing records, obtaining history, performing exam, counseling patient and documenting visit / findings.   Willette Cluster , NP 02/10/2020, 3:25 PM

## 2020-02-11 NOTE — Progress Notes (Signed)
Reviewed and agree with management plan.  Celestina Gironda T. Hiba Garry, MD FACG [facility]  

## 2020-02-16 ENCOUNTER — Telehealth: Payer: Self-pay | Admitting: Family Medicine

## 2020-02-17 ENCOUNTER — Ambulatory Visit: Payer: Self-pay | Admitting: Family Medicine

## 2020-02-26 ENCOUNTER — Encounter (HOSPITAL_COMMUNITY): Payer: Self-pay

## 2020-02-26 ENCOUNTER — Other Ambulatory Visit: Payer: Self-pay

## 2020-02-26 DIAGNOSIS — M25561 Pain in right knee: Secondary | ICD-10-CM | POA: Diagnosis present

## 2020-02-26 DIAGNOSIS — Z79899 Other long term (current) drug therapy: Secondary | ICD-10-CM | POA: Diagnosis not present

## 2020-02-26 DIAGNOSIS — M25571 Pain in right ankle and joints of right foot: Secondary | ICD-10-CM | POA: Diagnosis not present

## 2020-02-26 NOTE — ED Triage Notes (Signed)
Right knee pain x 4-5 days ago at work. Rx naproxen. XR done yesterday. Pt tried driving to see friend. Pulled over and called EMS after unable to drive.

## 2020-02-27 ENCOUNTER — Emergency Department (HOSPITAL_COMMUNITY): Payer: 59

## 2020-02-27 ENCOUNTER — Emergency Department (HOSPITAL_COMMUNITY)
Admission: EM | Admit: 2020-02-27 | Discharge: 2020-02-27 | Disposition: A | Discharge status: Home / Self Care | Payer: 59 | Attending: Emergency Medicine | Admitting: Emergency Medicine

## 2020-02-27 ENCOUNTER — Emergency Department (HOSPITAL_COMMUNITY)
Admission: EM | Admit: 2020-02-27 | Discharge: 2020-02-27 | Discharge status: Against Medical Advice | Payer: 59 | Source: Home / Self Care

## 2020-02-27 DIAGNOSIS — M25571 Pain in right ankle and joints of right foot: Secondary | ICD-10-CM

## 2020-02-27 DIAGNOSIS — R52 Pain, unspecified: Secondary | ICD-10-CM

## 2020-02-27 DIAGNOSIS — M25561 Pain in right knee: Secondary | ICD-10-CM

## 2020-02-27 MED ORDER — OXYCODONE HCL 5 MG PO TABS
5.0000 mg | ORAL_TABLET | Freq: Once | ORAL | Status: AC
  Administered 2020-02-27: 5 mg via ORAL
  Filled 2020-02-27: qty 1

## 2020-02-27 MED ORDER — MELOXICAM 15 MG PO TABS
15.0000 mg | ORAL_TABLET | Freq: Every day | ORAL | 0 refills | Status: DC
Start: 2020-02-27 — End: 2020-03-09

## 2020-02-27 MED ORDER — DICLOFENAC SODIUM 1 % EX GEL
2.0000 g | Freq: Four times a day (QID) | CUTANEOUS | 0 refills | Status: DC
Start: 2020-02-27 — End: 2020-09-15

## 2020-02-27 NOTE — Progress Notes (Signed)
Orthopedic Tech Progress Note Patient Details:  Nicholas Johnson 1991-08-03 [id number]  Ortho Devices Type of Ortho Device: ASO Ortho Device/Splint Location: Charging for ER supplies       Saul Fordyce 02/27/2020, 6:47 AM

## 2020-02-27 NOTE — Discharge Instructions (Signed)
1. Medications: Take Mobic as directed for inflammation and pain control, use Voltaren gel for additional pain control, usual home medications 2. Treatment: rest, ice, elevate and use brace, drink plenty of fluids, gentle stretching 3. Follow Up: Please followup with orthopedics as directed or your PCP in 1 week if no improvement for discussion of your diagnoses and further evaluation after today's visit; if you do not have a primary care doctor use the resource guide provided to find one; Please return to the ER for worsening symptoms or other concerns

## 2020-02-27 NOTE — ED Provider Notes (Signed)
[facility] Provider Note   CSN: [id number] Arrival date & time: 02/26/20  2346     History Chief Complaint  Patient presents with  . Knee Pain    Nicholas Johnson. is a 29 y.o. 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  Pt reports he continued to work for several days, jumping onto the knee from the truck when the pain worsened significantly  Several days ago.  Pt reports he is no longer able to ambulate and had to call EMS while trying to drive here earlier tonight.  He reports pain is 10/10 and unbearable.  He reports some associated swelling to the medical portion of the knee.  Movement and palpation makes his symptoms worse, nothing makes the pain better.  Pt denies fever, chills, additional injuries, IVDU, penile discharge, dysuria.     The history is provided by the patient and medical records. No language interpreter was used.       Past Medical History:  Diagnosis Date  . Nasal congestion   . Parotid gland pain     Patient Active Problem List   Diagnosis Date Noted  . 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 Altered mental status   . Psychoses (HCC)   . Parotid gland pain   . Nasal congestion     Past Surgical History:  Procedure Laterality Date  . WISDOM TOOTH EXTRACTION     25        Family History  Problem Relation Age of Onset  . Colon cancer Neg Hx   . Esophageal cancer Neg Hx   . Pancreatic cancer Neg Hx   . Stomach cancer Neg Hx   . Liver disease Neg Hx     Social History   Tobacco Use  . Smoking status: Never Smoker  . Smokeless tobacco: Never Used  Substance Use Topics  . Alcohol use: No  . Drug use: Not Currently    Types: Marijuana    Comment: several years ago as of 01/07/20    Home Medications Prior to Admission medications   Medication Sig Start Date End Date Taking?  Authorizing Provider  diclofenac Sodium (VOLTAREN) 1 % GEL Apply 2 g topically 4 (four) times daily. 02/27/20   Gerrad Welker, Dahlia Client, PA-C  docusate sodium (COLACE) 100 MG capsule Take 200 mg by mouth at bedtime as needed for mild constipation.    [provider]  meloxicam (MOBIC) 15 MG tablet Take 1 tablet (15 mg total) by mouth daily. With food 02/27/20   Jaia Alonge, Dahlia Client, PA-C  methocarbamol (ROBAXIN) 500 MG tablet Take 1 tablet (500 mg total) by mouth at bedtime. 02/10/20   Meredith Pel, NP  polyethylene glycol (MIRALAX / GLYCOLAX) 17 g packet Take 17 g by mouth daily as needed.    [provider]    Allergies    Patient has no known allergies.  Review of Systems   Review of Systems  Constitutional: Negative for chills and fever.  Respiratory: Negative for shortness of breath.   Cardiovascular: Negative for chest pain.  Gastrointestinal: Negative for abdominal pain.  Genitourinary: Negative for discharge and dysuria.  Musculoskeletal: Positive for arthralgias and joint swelling.  Skin: Negative for color change and wound.  Allergic/Immunologic: Negative for immunocompromised state.  Neurological: Negative for headaches.  Hematological: Negative for adenopathy. Does not bruise/bleed easily.  Psychiatric/Behavioral: The patient is not nervous/anxious.  Physical Exam Updated Vital Signs BP 138/86 (BP Location: Right Arm)   Pulse 72   Temp 98.1 F (36.7 C) (Oral)   Resp 16   Ht 5\' 7"  (1.702 m)   Wt 56 kg   SpO2 100%   BMI 19.34 kg/m   Physical Exam Vitals and nursing note reviewed.  Constitutional:      General: He is not in acute distress.    Appearance: He is not diaphoretic.  HENT:     Head: Normocephalic.  Eyes:     General: No scleral icterus.    Conjunctiva/sclera: Conjunctivae normal.  Cardiovascular:     Rate and Rhythm: Normal rate and regular rhythm.     Pulses: Normal pulses.          Radial pulses are 2+ on the right side and  2+ on the left side.  Pulmonary:     Effort: No tachypnea, accessory muscle usage, prolonged expiration, respiratory distress or retractions.     Comments: Equal chest rise. No increased work of breathing. Abdominal:     General: There is no distension.     Palpations: Abdomen is soft.  Musculoskeletal:     Cervical back: Normal range of motion.     Right hip: Normal.     Left hip: Normal.     Right upper leg: Normal.     Left upper leg: Normal.     Right knee: Swelling and bony tenderness present. No ecchymosis or crepitus. Decreased range of motion. Tenderness present over the medial joint line. No patellar tendon tenderness. Normal patellar mobility. Normal pulse.     Left knee: Normal. No ecchymosis, bony tenderness or crepitus. Normal range of motion. No tenderness. No patellar tendon tenderness. Normal patellar mobility. Normal pulse.     Right lower leg: Normal. No swelling. No edema.     Left lower leg: Normal. No swelling. No edema.     Right ankle: Tenderness (generalized) present.     Right Achilles Tendon: Normal. No tenderness. Thompson's test negative.     Left ankle: Normal.     Left Achilles Tendon: Normal.     Right foot: Normal.     Left foot: Normal.     Comments: Moves all extremities equally and without difficulty.  Skin:    General: Skin is warm and dry.     Capillary Refill: Capillary refill takes less than 2 seconds.  Neurological:     Mental Status: He is alert.     GCS: GCS eye subscore is 4. GCS verbal subscore is 5. GCS motor subscore is 6.     Comments: Speech is clear and goal oriented.  Psychiatric:        Mood and Affect: Mood normal.     ED Results / Procedures / Treatments    Radiology DG Ankle Complete Right  Result Date: 02/27/2020 CLINICAL DATA:  Ankle pain after jumping off truck EXAM: RIGHT ANKLE - COMPLETE 3+ VIEW COMPARISON:  None. FINDINGS: Trace effusion. No acute bony abnormality of the ankle. Specifically, no fracture, subluxation,  or dislocation. No significant soft tissue swelling. Ankle mortise is congruent. No visible osseous injury in the included portions of the mid and hindfoot. IMPRESSION: No acute bony abnormality. Trace effusion. Electronically Signed   By: Lovena Le M.D.   On: 02/27/2020 04:02   CT Knee Right Wo Contrast  Result Date: 02/27/2020 CLINICAL DATA:  Right knee pain beginning 4-5 days ago. EXAM: CT OF THE right KNEE WITHOUT CONTRAST TECHNIQUE:  Multidetector CT imaging of the right knee was performed according to the standard protocol. Multiplanar CT image reconstructions were also generated. COMPARISON:  Right knee radiographs/7/1 FINDINGS: Bones/Joint/Cartilage Acute or healing fractures are present. Ligaments Suboptimally assessed by CT. Cruciate ligaments appear to be intact. Muscles and Tendons Acute or focal muscle abnormality is present. Patellar tendon is intact. Soft tissues No significant effusion present. Acute or focal soft tissue injury is evident. IMPRESSION: No acute or focal fracture or soft tissue injury to explain patient's pain. No significant effusion is present. Electronically Signed   By: Marin Roberts M.D.   On: 02/27/2020 04:16   DG Knee Complete 4 Views Right  Result Date: 02/27/2020 CLINICAL DATA:  Initial evaluation for acute pain at medial aspect of right knee. EXAM: RIGHT KNEE - COMPLETE 4+ VIEW COMPARISON:  None. FINDINGS: No acute fracture or dislocation. Joint spaces maintained without evidence for degenerative or erosive arthropathy. Probable small joint effusion within the suprapatellar recess. Osseous mineralization normal. No soft tissue injury. IMPRESSION: 1. No acute osseous abnormality. 2. Probable small joint effusion within the suprapatellar recess. Electronically Signed   By: Rise Mu M.D.   On: 02/27/2020 00:31    Procedures Procedures (including critical care time)  Medications Ordered in ED Medications  oxyCODONE (Oxy IR/ROXICODONE) immediate  release tablet 5 mg (5 mg Oral Given 02/27/20 0423)    ED Course  I have reviewed the triage vital signs and the nursing notes.  Pertinent labs & imaging results that were available during my care of the patient were reviewed by me and considered in my medical decision making (see chart for details).  Clinical Course as of Feb 26 442  Fri Feb 27, 2020  6301 No evidence of tibial plateau fracture or large joint effusion.  I personally evaluated these images.  CT Knee Right Wo Contrast [HM]  0442 No evidence of fracture or dislocation.  I personally evaluated these images.  DG Ankle Complete Right [HM]    Clinical Course User Index [HM] Maysin Carstens, Boyd Kerbs   MDM Rules/Calculators/A&P                       Patient presents with right knee pain.  Given mechanism and inability to weight-bear with normal x-ray, will obtain CT scan to evaluate for possible tibial plateau fracture.  Patient also complaining of some ankle pain.  Will obtain plain films.  4:41 AM  CT scan without evidence of tibial plateau fracture.  Ankle films without evidence of fracture or dislocation.  Personally evaluated these images.  Given reassuring physical exam and imaging suspect strain versus sprain.  Will give anti-inflammatories, knee sleeve, ASO and continued use of crutches.  Recommend close follow-up with orthopedics or primary care if symptoms persist.  No clinical or radiographic evidence of septic joint.    Final Clinical Impression(s) / ED Diagnoses Final diagnoses:  Pain  Acute pain of right knee  Acute right ankle pain    Rx / DC Orders ED Discharge Orders         Ordered    meloxicam (MOBIC) 15 MG tablet  Daily     02/27/20 0439    diclofenac Sodium (VOLTAREN) 1 % GEL  4 times daily     02/27/20 0439           Leiya Keesey, Dahlia Client, PA-C 02/27/20 0443    Molpus, Jonny Ruiz, MD 02/27/20 [id number]

## 2020-03-01 ENCOUNTER — Emergency Department (HOSPITAL_COMMUNITY)
Admission: EM | Admit: 2020-03-01 | Discharge: 2020-03-01 | Disposition: A | Discharge status: Home / Self Care | Payer: 59 | Attending: Emergency Medicine | Admitting: Emergency Medicine

## 2020-03-01 ENCOUNTER — Emergency Department (HOSPITAL_COMMUNITY): Payer: 59

## 2020-03-01 ENCOUNTER — Other Ambulatory Visit: Payer: Self-pay

## 2020-03-01 ENCOUNTER — Encounter (HOSPITAL_COMMUNITY): Payer: Self-pay

## 2020-03-01 DIAGNOSIS — M25561 Pain in right knee: Secondary | ICD-10-CM | POA: Diagnosis present

## 2020-03-01 DIAGNOSIS — F1210 Cannabis abuse, uncomplicated: Secondary | ICD-10-CM | POA: Diagnosis not present

## 2020-03-01 DIAGNOSIS — Z79899 Other long term (current) drug therapy: Secondary | ICD-10-CM | POA: Diagnosis not present

## 2020-03-01 MED ORDER — METHOCARBAMOL 500 MG PO TABS
500.0000 mg | ORAL_TABLET | Freq: Two times a day (BID) | ORAL | 0 refills | Status: DC
Start: 2020-03-01 — End: 2020-09-15

## 2020-03-01 MED ORDER — NAPROXEN 500 MG PO TABS
500.0000 mg | ORAL_TABLET | Freq: Two times a day (BID) | ORAL | 0 refills | Status: DC
Start: 2020-03-01 — End: 2020-09-07

## 2020-03-01 MED ORDER — HYDROCODONE-ACETAMINOPHEN 5-325 MG PO TABS
1.0000 | ORAL_TABLET | Freq: Once | ORAL | Status: AC
  Administered 2020-03-01: 1 via ORAL
  Filled 2020-03-01: qty 1

## 2020-03-01 MED ORDER — HYDROCODONE-ACETAMINOPHEN 5-325 MG PO TABS: 1.0000 | ORAL_TABLET | Freq: Four times a day (QID) | ORAL | 0 refills | Status: DC | PRN
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
## 2020-03-01 NOTE — ED Notes (Signed)
An After Visit Summary was printed and given to the patient. Discharge instructions given and no further questions at this time.  

## 2020-03-01 NOTE — ED Notes (Signed)
Labeled urine specimen to lab. [employer]

## 2020-03-01 NOTE — ED Notes (Signed)
Nurse requested for  to be ambulate, and I help pt wit the ambulation but he was unable to apply pressure on his right feet which is hurt.

## 2020-03-01 NOTE — Discharge Instructions (Signed)
As discussed, your x-rays were negative for any acute abnormalities. Please follow-up with orthopedic for further evaluation. Report to your MRI on Saturday for further evaluation. I am sending you home with pain medication. Take as prescribed. Return to the ER for new or worsening symptoms.

## 2020-03-03 ENCOUNTER — Telehealth: Payer: Self-pay

## 2020-03-03 NOTE — Telephone Encounter (Signed)
Pt wants to know about his MRI and if insurance will cover it. Pt would like a call back.

## 2020-03-03 NOTE — Telephone Encounter (Signed)
Patient called and left me a voicemail also. Called patient and he stated that he has seen [facility] and they have ordered an MRI per patient report for his knee. The MRI per patient is scheduled for this Saturday. Patient states that it has not been approved and he is worried that it will not be approved before the scheduled appointment. I advised patient to contact [facility] in the setting of his intense pain since they have seen him for this issue. Patient also stated that pain is unbearable even after taking medications prescribed by the ER on 03/01/2020. Advised to call [facility] and or go back to ER. Patient verbalized understanding.

## 2020-03-03 NOTE — Telephone Encounter (Signed)
Do you know what he is referring to as I see no mri scheduled or ordered?

## 2020-03-04 ENCOUNTER — Telehealth: Payer: Self-pay | Admitting: Family Medicine

## 2020-03-04 NOTE — Telephone Encounter (Signed)
Patient called into the office today asking if we could expedite his MRI ordered by [facility] for his knee. Patient states that his leg is hurting him really bad and he is not getting any relief from medications that were prescribed to him in the ER or by [facility]. I advised patient that we could not expedite any imaging that we did not order.I also advised him to contact his orthopedic specialist and notify them of his increased pain/ Patient verbalized understanding and asked if we would cancel his appointment tomorrow with Dr. Tanya Nones. Appointment cancelled.

## 2020-03-05 ENCOUNTER — Encounter (HOSPITAL_COMMUNITY): Payer: Self-pay

## 2020-03-05 ENCOUNTER — Other Ambulatory Visit: Payer: Self-pay

## 2020-03-05 ENCOUNTER — Emergency Department (HOSPITAL_COMMUNITY): Payer: 59

## 2020-03-05 ENCOUNTER — Emergency Department (HOSPITAL_COMMUNITY)
Admission: EM | Admit: 2020-03-05 | Discharge: 2020-03-05 | Disposition: A | Discharge status: Home / Self Care | Payer: 59 | Attending: Emergency Medicine | Admitting: Emergency Medicine

## 2020-03-05 ENCOUNTER — Ambulatory Visit: Payer: Medicaid Other | Admitting: Family Medicine

## 2020-03-05 DIAGNOSIS — M25561 Pain in right knee: Secondary | ICD-10-CM | POA: Insufficient documentation

## 2020-03-05 DIAGNOSIS — M25551 Pain in right hip: Secondary | ICD-10-CM

## 2020-03-05 DIAGNOSIS — W1849XD Other slipping, tripping and stumbling without falling, subsequent encounter: Secondary | ICD-10-CM | POA: Diagnosis not present

## 2020-03-05 MED ORDER — DIAZEPAM 5 MG PO TABS
5.0000 mg | ORAL_TABLET | Freq: Once | ORAL | Status: AC
  Administered 2020-03-05: 5 mg via ORAL
  Filled 2020-03-05: qty 1

## 2020-03-05 NOTE — ED Notes (Signed)
Patient provided with urinal.

## 2020-03-05 NOTE — ED Notes (Signed)
Patient given water and graham crackers

## 2020-03-05 NOTE — Discharge Instructions (Addendum)
Please continue applying ice and heat as tolerated to the right hip, thigh, knee.  You can continue to take your pain medications if you find that they help.  Please take them as prescribed.  Please stay adequately hydrated.  Continue to walk and use the right leg as tolerated.  Please get your MRI tomorrow which you have scheduled.  Please feel free to return to the emergency department with any new or worsening symptoms.
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
## 2020-03-05 NOTE — ED Triage Notes (Signed)
Pt arrived via [facility] from hotel/place of residence CC lower back pain X 1 week r/t work injury. Pt reports having MRI schedule for tm at " [facility]". Per EMS pt wa sable to stand and walk with crutches A/OX4. Pt is currently taking Hydrocodone, Naproxen, Meloxicam, cyclobenzaprine, and prednisolone. Pt reports taking some of those medications this morning without relief. Pt denies any urinary symptoms or incontinence.

## 2020-03-08 ENCOUNTER — Other Ambulatory Visit: Payer: Self-pay

## 2020-03-08 ENCOUNTER — Encounter (HOSPITAL_COMMUNITY): Payer: Self-pay | Admitting: Emergency Medicine

## 2020-03-08 ENCOUNTER — Emergency Department (HOSPITAL_COMMUNITY)
Admission: EM | Admit: 2020-03-08 | Discharge: 2020-03-08 | Disposition: A | Discharge status: Home / Self Care | Payer: 59 | Attending: Emergency Medicine | Admitting: Emergency Medicine

## 2020-03-08 ENCOUNTER — Telehealth (INDEPENDENT_AMBULATORY_CARE_PROVIDER_SITE_OTHER): Payer: 59 | Admitting: Family Medicine

## 2020-03-08 DIAGNOSIS — M25561 Pain in right knee: Secondary | ICD-10-CM | POA: Insufficient documentation

## 2020-03-08 DIAGNOSIS — M79604 Pain in right leg: Secondary | ICD-10-CM

## 2020-03-08 DIAGNOSIS — Z5321 Procedure and treatment not carried out due to patient leaving prior to being seen by health care provider: Secondary | ICD-10-CM | POA: Insufficient documentation

## 2020-03-08 NOTE — ED Notes (Signed)
No answer x2 

## 2020-03-08 NOTE — ED Triage Notes (Signed)
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 My back on the right side and rt. Leg pain. The injury was to my rt. Knee.

## 2020-03-08 NOTE — Progress Notes (Signed)
Subjective:    Patient ID: Nicholas Overall., male    DOB: 11-03-1990, 29 y.o.   MRN: [id number]  HPI  Patient is being seen today as a video visit for pain in his right leg.  We recommended an in person visit, however he wanted a virtual visit.  I iniitially called the patient at 9:00.  However he prefers to wait until 915 so that his mother can be present for his video conference call.  He consents to be seen virtually.  Visit began at 915 and ended at 930.   Initial injury was at the beginning of May.  Apparently he jumped from a truck and landed awkwardly on his right leg developing severe pain in his knee and ankle.  He has been seen in the emergency room on May 7, May 10, and May 14.  He had a CT scan of the right knee that was completely normal and showed no evidence of any fracture or tibial plateau injury.  He also had an x-ray of the femur as well as the right knee that was completely normal.  He also had an x-ray of the right hip that was completely normal.  Patient reports severe burning-like pain starting in his right posterior hip radiating down his right posterior hamstring into the posterior aspect of his right knee.  He also complains of pain occasionally radiating into his calf.  However the vast majority of the pain is centered in the posterior aspect of his right knee.  It hurts to bend his knee.  It hurts to have pressure behind his knee.  Apparently Saturday he went for an MRI of his back and his knee performed by his orthopedist who he has seen for this.  However he was unable to lie completely still due to pain in his knee.  Therefore they were unable to successfully obtain an MRI.  He has call back to the orthopedist about rescheduling this and what other options exist however he has not yet heard back from them.  He is questioning what he can do for the pain.  He states that it hurts to bear weight on his leg.  Past Medical History:  Diagnosis Date  . Nasal congestion   .  Parotid gland pain    Past Surgical History:  Procedure Laterality Date  . WISDOM TOOTH EXTRACTION     25    Current Outpatient Medications on File Prior to Visit  Medication Sig Dispense Refill  . cyclobenzaprine (FLEXERIL) 5 MG tablet Take 5 mg by mouth 3 (three) times daily as needed for muscle spasms.     . diclofenac Sodium (VOLTAREN) 1 % GEL Apply 2 g topically 4 (four) times daily. 100 g 0  . HYDROcodone-acetaminophen (NORCO/VICODIN) 5-325 MG tablet Take 1 tablet by mouth every 6 (six) hours as needed for severe pain. 10 tablet 0  . MEDROL 4 MG TBPK tablet Take 4 mg by mouth as directed.     . meloxicam (MOBIC) 15 MG tablet Take 1 tablet (15 mg total) by mouth daily. With food 30 tablet 0  . methocarbamol (ROBAXIN) 500 MG tablet Take 1 tablet (500 mg total) by mouth at bedtime. (Patient not taking: Reported on 03/01/2020) 5 tablet 0  . methocarbamol (ROBAXIN) 500 MG tablet Take 1 tablet (500 mg total) by mouth 2 (two) times daily. 20 tablet 0  . naproxen (NAPROSYN) 500 MG tablet Take 500 mg by mouth 2 (two) times daily.    Marland Kitchen  naproxen (NAPROSYN) 500 MG tablet Take 1 tablet (500 mg total) by mouth 2 (two) times daily. 30 tablet 0   No current facility-administered medications on file prior to visit.   No Known Allergies Social History   Socioeconomic History  . Marital status: Single    Spouse name: Not on file  . Number of children: Not on file  . Years of education: Not on file  . Highest education level: Not on file  Occupational History  . Not on file  Tobacco Use  . Smoking status: Never Smoker  . Smokeless tobacco: Never Used  Substance and Sexual Activity  . Alcohol use: No  . Drug use: Not Currently    Types: Marijuana    Comment: several years ago as of 01/07/20  . Sexual activity: Not on file  Other Topics Concern  . Not on file  Social History Narrative  . Not on file   Social Determinants of Health   Financial Resource Strain:   . Difficulty of Paying  Living Expenses:   Food Insecurity:   . Worried About Charity fundraiser in the Last Year:   . Arboriculturist in the Last Year:   Transportation Needs:   . Film/video editor (Medical):   Marland Kitchen Lack of Transportation (Non-Medical):   Physical Activity:   . Days of Exercise per Week:   . Minutes of Exercise per Session:   Stress:   . Feeling of Stress :   Social Connections:   . Frequency of Communication with Friends and Family:   . Frequency of Social Gatherings with Friends and Family:   . Attends Religious Services:   . Active Member of Clubs or Organizations:   . Attends Archivist Meetings:   Marland Kitchen Marital Status:   Intimate Partner Violence:   . Fear of Current or Ex-Partner:   . Emotionally Abused:   Marland Kitchen Physically Abused:   . Sexually Abused:      Review of Systems  All other systems reviewed and are negative.      Objective:   Physical Exam  Physical exam cannot be performed due to the limitations of a video visit.        Assessment & Plan:  Differential diagnosis includes right-sided sciatica versus a meniscal tear versus torn hamstring.  Is difficult to determine through a video conference call.  Patient already has an MRI scheduled of his back and his knee per his report with his orthopedist.  I recommended that he reach out to them today.  If they can reschedule the MRI, I can call him on Valium 10 mg 30 minutes prior to the test so that he would be able to lie completely still in order to give an accurate evaluation of his lumbar spine as well as his posterior knee.  Patient will call me back with the orthopedics recommendations and then I will call the Valium once they have scheduled the MRI.Marland Kitchen

## 2020-03-09 ENCOUNTER — Other Ambulatory Visit: Payer: Self-pay

## 2020-03-09 ENCOUNTER — Encounter: Payer: Self-pay | Admitting: Family Medicine

## 2020-03-09 ENCOUNTER — Ambulatory Visit (INDEPENDENT_AMBULATORY_CARE_PROVIDER_SITE_OTHER): Payer: 59 | Admitting: Family Medicine

## 2020-03-09 VITALS — BP 110/64 | HR 66 | Temp 97.3°F | Resp 14 | Ht 67.0 in | Wt 129.0 lb

## 2020-03-09 DIAGNOSIS — M79604 Pain in right leg: Secondary | ICD-10-CM | POA: Diagnosis not present

## 2020-03-09 DIAGNOSIS — M25561 Pain in right knee: Secondary | ICD-10-CM | POA: Insufficient documentation

## 2020-03-09 HISTORY — DX: Pain in right knee: M25.561

## 2020-03-09 NOTE — Progress Notes (Signed)
Subjective:    Patient ID: [name], male    DOB: [birth year], 29 y.o.   MRN: [id number]  HPI 03/08/20 Patient is being seen today as a video visit for pain in his right leg.  We recommended an in person visit, however he wanted a virtual visit.  I iniitially called the patient at 9:00.  However he prefers to wait until 915 so that his mother can be present for his video conference call.  He consents to be seen virtually.  Visit began at 915 and ended at 930.   Initial injury was at the beginning of May.  Apparently he jumped from a truck and landed awkwardly on his right leg developing severe pain in his knee and ankle.  He has been seen in the emergency room on May 7, May 10, and May 14.  He had a CT scan of the right knee that was completely normal and showed no evidence of any fracture or tibial plateau injury.  He also had an x-ray of the femur as well as the right knee that was completely normal.  He also had an x-ray of the right hip that was completely normal.  Patient reports severe burning-like pain starting in his right posterior hip radiating down his right posterior hamstring into the posterior aspect of his right knee.  He also complains of pain occasionally radiating into his calf.  However the vast majority of the pain is centered in the posterior aspect of his right knee.  It hurts to bend his knee.  It hurts to have pressure behind his knee.  Apparently Saturday he went for an MRI of his back and his knee performed by his orthopedist who he has seen for this.  However he was unable to lie completely still due to pain in his knee.  Therefore they were unable to successfully obtain an MRI.  He has call back to the orthopedist about rescheduling this and what other options exist however he has not yet heard back from them.  He is questioning what he can do for the pain.  He states that it hurts to bear weight on his leg.  At that time, my plan was: Differential diagnosis includes  right-sided sciatica versus a meniscal tear versus torn hamstring.  Is difficult to determine through a video conference call.  Patient already has an MRI scheduled of his back and his knee per his report with his orthopedist.  I recommended that he reach out to them today.  If they can reschedule the MRI, I can call him on Valium 10 mg 30 minutes prior to the test so that he would be able to lie completely still in order to give an accurate evaluation of his lumbar spine as well as his posterior knee.  Patient will call me back with the orthopedics recommendations and then I will call the Valium once they have scheduled the MRI..  03/09/20 Went to ER last night but left before he was seen.  Patient is here today with his mother.  He has an appointment to see his orthopedist, Dr. Eulah Pont tomorrow.  He has an MRI scheduled for next Monday of his knee and he also states that it may be of his back as well.  He reports severe pain.  He is walking on crutches and refuses to bear weight on his right leg.  The patient was able to sit on the exam table with assistance.  There is no erythema in  the right knee.  There is no bruising.  There is no effusion.  There is no obvious swelling.  There is no laxity to varus or valgus stress although the patient reports severe pain.  He has a negative anterior and posterior drawer sign with no evidence of instability.  He has a negative Apley grind.  There is no Baker's cyst.  As I am conducting the exam the patient starts to report burning stinging pain in a nonanatomic pattern radiating into his posterior right ankle.  However with distraction, the patient has no obvious pain in his right knee.  As he begins to focus on his ankle, he is flexing his knee with no apparent distress.  His mother brings this to his attention.  The patient has a difficult time describing the pain in his leg.  He reports it is a burning stinging pain.  He has negative straight leg raise today.  He has  normal muscle strength 5/5 equal and symmetric in both legs.  There is no pain with range of motion in his hip.  There is no pain or laxity with range of motion in his ankle.  There is no swelling or erythema in the leg.  Past Medical History:  Diagnosis Date  . Nasal congestion   . Parotid gland pain    Past Surgical History:  Procedure Laterality Date  . WISDOM TOOTH EXTRACTION     25    Current Outpatient Medications on File Prior to Visit  Medication Sig Dispense Refill  . cyclobenzaprine (FLEXERIL) 5 MG tablet Take 5 mg by mouth 3 (three) times daily as needed for muscle spasms.     . diclofenac Sodium (VOLTAREN) 1 % GEL Apply 2 g topically 4 (four) times daily. 100 g 0  . HYDROcodone-acetaminophen (NORCO/VICODIN) 5-325 MG tablet Take 1 tablet by mouth every 6 (six) hours as needed for severe pain. 10 tablet 0  . MEDROL 4 MG TBPK tablet Take 4 mg by mouth as directed.     . meloxicam (MOBIC) 15 MG tablet Take 1 tablet (15 mg total) by mouth daily. With food 30 tablet 0  . methocarbamol (ROBAXIN) 500 MG tablet Take 1 tablet (500 mg total) by mouth at bedtime. (Patient not taking: Reported on 03/01/2020) 5 tablet 0  . methocarbamol (ROBAXIN) 500 MG tablet Take 1 tablet (500 mg total) by mouth 2 (two) times daily. 20 tablet 0  . naproxen (NAPROSYN) 500 MG tablet Take 500 mg by mouth 2 (two) times daily.    . naproxen (NAPROSYN) 500 MG tablet Take 1 tablet (500 mg total) by mouth 2 (two) times daily. 30 tablet 0   No current facility-administered medications on file prior to visit.   No Known Allergies Social History   Socioeconomic History  . Marital status: Single    Spouse name: Not on file  . Number of children: Not on file  . Years of education: Not on file  . Highest education level: Not on file  Occupational History  . Not on file  Tobacco Use  . Smoking status: Never Smoker  . Smokeless tobacco: Never Used  Substance and Sexual Activity  . Alcohol use: No  . Drug  use: Not Currently    Types: Marijuana    Comment: several years ago as of 01/07/20  . Sexual activity: Not on file  Other Topics Concern  . Not on file  Social History Narrative  . Not on file   Social Determinants of Health  Financial Resource Strain:   . Difficulty of Paying Living Expenses:   Food Insecurity:   . Worried About Charity fundraiser in the Last Year:   . Arboriculturist in the Last Year:   Transportation Needs:   . Film/video editor (Medical):   Marland Kitchen Lack of Transportation (Non-Medical):   Physical Activity:   . Days of Exercise per Week:   . Minutes of Exercise per Session:   Stress:   . Feeling of Stress :   Social Connections:   . Frequency of Communication with Friends and Family:   . Frequency of Social Gatherings with Friends and Family:   . Attends Religious Services:   . Active Member of Clubs or Organizations:   . Attends Archivist Meetings:   Marland Kitchen Marital Status:   Intimate Partner Violence:   . Fear of Current or Ex-Partner:   . Emotionally Abused:   Marland Kitchen Physically Abused:   . Sexually Abused:      Review of Systems  All other systems reviewed and are negative.      Objective:   Physical Exam Vitals reviewed.  Cardiovascular:     Rate and Rhythm: Normal rate and regular rhythm.  Pulmonary:     Effort: Pulmonary effort is normal.     Breath sounds: Normal breath sounds.  Musculoskeletal:     Right hip: Normal. Normal range of motion.     Right upper leg: No swelling, edema, deformity or tenderness.     Right knee: Bony tenderness present. No swelling, deformity, effusion, erythema, ecchymosis or crepitus. Tenderness present over the medial joint line and lateral joint line. No MCL, LCL, ACL or PCL tenderness. No LCL laxity, MCL laxity, ACL laxity or PCL laxity. Normal meniscus.     Instability Tests: Negative medial McMurray test and negative lateral McMurray test.     Right ankle: No swelling, deformity or ecchymosis. No  tenderness. Normal range of motion.     Right Achilles Tendon: Normal.          Assessment & Plan:  Right leg pain  Patient's knee exam is completely normal.  I do not suspect a ligament tear or meniscal tear and previous imaging has ruled out any bony injury.  Patient does report neuropathic pain radiating down his right leg however I doubt any nerve impingement in the lumbar spine.  The patient has an appointment tomorrow to see his orthopedist as well as an MRI already scheduled.  I counseled the patient that no other testing should be performed prior to the MRI.  He is not using the hydrocodone as prescribed to him.  I asked him why he continues to return to the emergency room and he states for pain control.  I explained to the patient that he should use the hydrocodone every 6 hours as needed for pain control.  The MRI is the next test that is needed.  There will not be any type of emergency surgery performed in the emergency room and therefore we should focus on pain control until he has the MRI performed at his orthopedist office.  I tried to explain the medical rationale for not returning to the emergency room.

## 2020-03-11 ENCOUNTER — Telehealth: Payer: Self-pay | Admitting: Family Medicine

## 2020-03-11 ENCOUNTER — Other Ambulatory Visit: Payer: Self-pay | Admitting: Family Medicine

## 2020-03-11 MED ORDER — HYDROCODONE-ACETAMINOPHEN 5-325 MG PO TABS: 1.0000 | ORAL_TABLET | Freq: Four times a day (QID) | ORAL | 0 refills | Status: DC | PRN

## 2020-03-11 NOTE — Telephone Encounter (Signed)
ok 

## 2020-03-11 NOTE — Telephone Encounter (Signed)
CB#[phone] refill  Hydrocodone pharmacy to sent ref [facility] in [place]

## 2020-03-11 NOTE — Telephone Encounter (Signed)
Med sent to pharm 

## 2020-03-11 NOTE — Telephone Encounter (Signed)
On 03/01/2020 is only got 10 pills - OK to refill

## 2020-03-12 ENCOUNTER — Other Ambulatory Visit: Payer: Self-pay | Admitting: Family Medicine

## 2020-03-12 ENCOUNTER — Telehealth: Payer: Self-pay

## 2020-03-12 MED ORDER — HYDROCODONE-ACETAMINOPHEN 5-325 MG PO TABS: 1.0000 | ORAL_TABLET | Freq: Four times a day (QID) | ORAL | 0 refills | Status: DC | PRN

## 2020-03-12 NOTE — Telephone Encounter (Signed)
CB#[phone] refill  Hydrocodone pharmacy to sent ref [facility] in [place]   He states that this was sent yesterday to [address]he is requesting it sent to the one in [place].

## 2020-03-12 NOTE — Telephone Encounter (Signed)
I have already sent it to [facility] yesterday.  Will need to get it there.

## 2020-03-12 NOTE — Telephone Encounter (Signed)
Pt is requesting that the hydrocodone be sent to [facility] [address] in [place]. Please advise.

## 2020-03-12 NOTE — Telephone Encounter (Signed)
Pt notified Verbalizes understanding 

## 2020-03-12 NOTE — Telephone Encounter (Signed)
Routed to Dr. Tanya Nones for review.

## 2020-03-12 NOTE — Telephone Encounter (Signed)
Can [facility]? I set up rx to go to correct pharm.

## 2020-03-12 NOTE — Telephone Encounter (Signed)
Note, pt called after hours ine 5/20 States he was "feeling funny", could not give me any specifics, but thought it had something to do with is leg pain Has been to ER multiple times and evaluated by PCP  He was also worried maybe he had a blood clot, but denied any swelling in his leg  Then stated he thinks he would feel better if he ate something and lay down  Advised him I will let PCP know he called Take meds as prescribed Scheduled for MRI on Monday Expected after 3 ER visits and PCP visit, if concern for DVT was there it was evaluated Pt voiced understanding   I spoke with PCP

## 2020-03-23 ENCOUNTER — Telehealth: Payer: Self-pay | Admitting: Family Medicine

## 2020-03-23 NOTE — Telephone Encounter (Signed)
Patient called in stating that he had MRI Lumbar spine and MRI of lower back with Nicholas Johnson. Patient states that they were both normal, however he is still having pain in lower leg and knee. He is requesting MRI now of entire leg. Advised patient to schedule an appointment. Patient verbalized understanding.

## 2020-03-25 ENCOUNTER — Other Ambulatory Visit: Payer: Self-pay

## 2020-03-25 ENCOUNTER — Ambulatory Visit (INDEPENDENT_AMBULATORY_CARE_PROVIDER_SITE_OTHER): Payer: 59 | Admitting: Family Medicine

## 2020-03-25 VITALS — BP 130/70 | HR 67 | Temp 100.2°F | Ht 67.0 in | Wt 129.0 lb

## 2020-03-25 DIAGNOSIS — M79604 Pain in right leg: Secondary | ICD-10-CM

## 2020-03-25 NOTE — Progress Notes (Signed)
Subjective:    Patient ID: Nicholas Johnson., male    DOB: 1991/01/22, 29 y.o.   MRN: [id number]  HPI 03/08/20 Patient is being seen today as a video visit for pain in his right leg.  We recommended an in person visit, however he wanted a virtual visit.  I iniitially called the patient at 9:00.  However he prefers to wait until 915 so that his mother can be present for his video conference call.  He consents to be seen virtually.  Visit began at 915 and ended at 930.   Initial injury was at the beginning of May.  Apparently he jumped from a truck and landed awkwardly on his right leg developing severe pain in his knee and ankle.  He has been seen in the emergency room on May 7, May 10, and May 14.  He had a CT scan of the right knee that was completely normal and showed no evidence of any fracture or tibial plateau injury.  He also had an x-ray of the femur as well as the right knee that was completely normal.  He also had an x-ray of the right hip that was completely normal.  Patient reports severe burning-like pain starting in his right posterior hip radiating down his right posterior hamstring into the posterior aspect of his right knee.  He also complains of pain occasionally radiating into his calf.  However the vast majority of the pain is centered in the posterior aspect of his right knee.  It hurts to bend his knee.  It hurts to have pressure behind his knee.  Apparently Saturday he went for an MRI of his back and his knee performed by his orthopedist who he has seen for this.  However he was unable to lie completely still due to pain in his knee.  Therefore they were unable to successfully obtain an MRI.  He has call back to the orthopedist about rescheduling this and what other options exist however he has not yet heard back from them.  He is questioning what he can do for the pain.  He states that it hurts to bear weight on his leg.  At that time, my plan was: Differential diagnosis includes  right-sided sciatica versus a meniscal tear versus torn hamstring.  Is difficult to determine through a video conference call.  Patient already has an MRI scheduled of his back and his knee per his report with his orthopedist.  I recommended that he reach out to them today.  If they can reschedule the MRI, I can call him on Valium 10 mg 30 minutes prior to the test so that he would be able to lie completely still in order to give an accurate evaluation of his lumbar spine as well as his posterior knee.  Patient will call me back with the orthopedics recommendations and then I will call the Valium once they have scheduled the MRI..  03/09/20 Went to ER last night but left before he was seen.  Patient is here today with his mother.  He has an appointment to see his orthopedist, Dr. Eulah Pont tomorrow.  He has an MRI scheduled for next Monday of his knee and he also states that it may be of his back as well.  He reports severe pain.  He is walking on crutches and refuses to bear weight on his right leg.  The patient was able to sit on the exam table with assistance.  There is no erythema in  the right knee.  There is no bruising.  There is no effusion.  There is no obvious swelling.  There is no laxity to varus or valgus stress although the patient reports severe pain.  He has a negative anterior and posterior drawer sign with no evidence of instability.  He has a negative Apley grind.  There is no Baker's cyst.  As I am conducting the exam the patient starts to report burning stinging pain in a nonanatomic pattern radiating into his posterior right ankle.  However with distraction, the patient has no obvious pain in his right knee.  As he begins to focus on his ankle, he is flexing his knee with no apparent distress.  His mother brings this to his attention.  The patient has a difficult time describing the pain in his leg.  He reports it is a burning stinging pain.  He has negative straight leg raise today.  He has  normal muscle strength 5/5 equal and symmetric in both legs.  There is no pain with range of motion in his hip.  There is no pain or laxity with range of motion in his ankle.  There is no swelling or erythema in the leg.  At that time, my plan was: Patient's knee exam is completely normal.  I do not suspect a ligament tear or meniscal tear and previous imaging has ruled out any bony injury.  Patient does report neuropathic pain radiating down his right leg however I doubt any nerve impingement in the lumbar spine.  The patient has an appointment tomorrow to see his orthopedist as well as an MRI already scheduled.  I counseled the patient that no other testing should be performed prior to the MRI.  He is not using the hydrocodone as prescribed to him.  I asked him why he continues to return to the emergency room and he states for pain control.  I explained to the patient that he should use the hydrocodone every 6 hours as needed for pain control.  The MRI is the next test that is needed.  There will not be any type of emergency surgery performed in the emergency room and therefore we should focus on pain control until he has the MRI performed at his orthopedist office.  I tried to explain the medical rationale for not returning to the emergency room.    03/25/20   Patient presents today on crutches.  He is now reporting pain over the anterior portion of his knee.  The pain is very difficult to localize.  He circles the entire knee with his hand as he describes the pain.  He states the pain is worse when he bears weight on his knee hence the reason he is walking with crutches.  I have a copy of the orthopedic notes today.  MRI of the lumbar spine was completely normal with no evidence of any nerve impingement.  MRI of the right knee showed no ligament injury.  The MRIs were completely normal.  Orthopedist recommended physical therapy Past Medical History:  Diagnosis Date  . Nasal congestion   . Parotid gland  pain   . Psychosis ([place])    admitted 12/15   Past Surgical History:  Procedure Laterality Date  . WISDOM TOOTH EXTRACTION     25    Current Outpatient Medications on File Prior to Visit  Medication Sig Dispense Refill  . diclofenac Sodium (VOLTAREN) 1 % GEL Apply 2 g topically 4 (four) times daily. 100 g 0  .  HYDROcodone-acetaminophen (NORCO/VICODIN) 5-325 MG tablet Take 1 tablet by mouth every 6 (six) hours as needed for severe pain. 10 tablet 0  . methocarbamol (ROBAXIN) 500 MG tablet Take 1 tablet (500 mg total) by mouth 2 (two) times daily. 20 tablet 0  . naproxen (NAPROSYN) 500 MG tablet Take 1 tablet (500 mg total) by mouth 2 (two) times daily. 30 tablet 0   No current facility-administered medications on file prior to visit.   No Known Allergies Social History   Socioeconomic History  . Marital status: Single    Spouse name: Not on file  . Number of children: Not on file  . Years of education: Not on file  . Highest education level: Not on file  Occupational History  . Not on file  Tobacco Use  . Smoking status: Never Smoker  . Smokeless tobacco: Never Used  Substance and Sexual Activity  . Alcohol use: No  . Drug use: Not Currently    Types: Marijuana    Comment: several years ago as of 01/07/20  . Sexual activity: Not on file  Other Topics Concern  . Not on file  Social History Narrative  . Not on file   Social Determinants of Health   Financial Resource Strain:   . Difficulty of Paying Living Expenses:   Food Insecurity:   . Worried About Programme researcher, broadcasting/film/video in the Last Year:   . Barista in the Last Year:   Transportation Needs:   . Freight forwarder (Medical):   Marland Kitchen Lack of Transportation (Non-Medical):   Physical Activity:   . Days of Exercise per Week:   . Minutes of Exercise per Session:   Stress:   . Feeling of Stress :   Social Connections:   . Frequency of Communication with Friends and Family:   . Frequency of Social Gatherings  with Friends and Family:   . Attends Religious Services:   . Active Member of Clubs or Organizations:   . Attends Banker Meetings:   Marland Kitchen Marital Status:   Intimate Partner Violence:   . Fear of Current or Ex-Partner:   . Emotionally Abused:   Marland Kitchen Physically Abused:   . Sexually Abused:      Review of Systems  All other systems reviewed and are negative.      Objective:   Physical Exam Vitals reviewed.  Cardiovascular:     Rate and Rhythm: Normal rate and regular rhythm.  Pulmonary:     Effort: Pulmonary effort is normal.     Breath sounds: Normal breath sounds.  Musculoskeletal:     Right hip: Normal. Normal range of motion.     Right upper leg: No swelling, edema, deformity or tenderness.     Right knee: Bony tenderness present. No swelling, deformity, effusion, erythema, ecchymosis or crepitus. No tenderness. No medial joint line, lateral joint line, MCL, LCL, ACL or PCL tenderness. No LCL laxity, MCL laxity, ACL laxity or PCL laxity. Normal meniscus.     Instability Tests: Negative medial McMurray test and negative lateral McMurray test.     Right ankle: No swelling, deformity or ecchymosis. No tenderness. Normal range of motion.     Right Achilles Tendon: Normal.          Assessment & Plan:  Right leg pain  Pain pattern has changed since last visit and now seems to be centered around the knee.  Thankfully MRI of his knee is completely normal.  MRI of the lumbar  spine is also completely normal.  I spent today's office visit reassuring the patient that there is no sign of any serious structural damage to any of the bones, nerves, or ligaments in his leg.  Certainly this could be a muscle strain around the knee.  I also have recommended physical therapy.  I do not feel pain medication is required at the present time.  I have also encouraged the patient to stop using crutches to improve mobility in his knee and prevent atrophy.

## 2020-04-13 ENCOUNTER — Ambulatory Visit: Payer: Self-pay | Admitting: Physician Assistant

## 2020-04-13 ENCOUNTER — Ambulatory Visit: Payer: Self-pay

## 2020-04-13 ENCOUNTER — Other Ambulatory Visit: Payer: Self-pay

## 2020-04-13 DIAGNOSIS — Z113 Encounter for screening for infections with a predominantly sexual mode of transmission: Secondary | ICD-10-CM

## 2020-04-14 ENCOUNTER — Encounter: Payer: Self-pay | Admitting: Physician Assistant

## 2020-04-14 NOTE — Progress Notes (Signed)
   [facility] Department STI clinic/screening visit  Subjective:  Nicholas Debow. is a 29 y.o. male being seen today for an STI screening visit. The patient reports they do not have symptoms.    Patient has the following medical conditions:   Patient Active Problem List   Diagnosis Date Noted  . 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 Altered mental status   . Psychoses ([facility])   . Parotid gland pain   . Nasal congestion      Chief Complaint  Patient presents with  . SEXUALLY TRANSMITTED DISEASE    screening    HPI  Patient reports that he is not having any symptoms but would like a screening today.  States that his last HIV test was 1 year ago.  Denies chronic conditions and states his only surgery was wisdom tooth removal.   See flowsheet for further details and programmatic requirements.    The following portions of the patient's history were reviewed and updated as appropriate: allergies, current medications, past medical history, past social history, past surgical history and problem list.  Objective:  There were no vitals filed for this visit.  Physical Exam Constitutional:      General: He is not in acute distress.    Appearance: Normal appearance.  HENT:     Head: Normocephalic and atraumatic.  Eyes:     Conjunctiva/sclera: Conjunctivae normal.  Pulmonary:     Effort: Pulmonary effort is normal.  Neurological:     Mental Status: He is alert and oriented to person, place, and time.  Psychiatric:        Mood and Affect: Mood normal.        Behavior: Behavior normal.        Thought Content: Thought content normal.        Judgment: Judgment normal.       Assessment and Plan:  Nicholas Tomaso. is a 29 y.o. male presenting to the [facility] Department for STI screening  1. Screening for STD (sexually transmitted disease) Patient declines screening exam and originally accepted blood work today but came back from lab and states that does  not want blood work today but will RTC at another time for blood work.  Rec condoms with all sex. Answered patient questions about how to read results that he pulled up on his mychart app from a visit at PCP office.     No follow-ups on file.  Future Appointments  Date Time Provider Department Center  04/15/2020 11:00 AM [facility] PROVIDER [facility] None    Matt Holmes, [place]

## 2020-04-15 ENCOUNTER — Ambulatory Visit: Payer: Medicaid Other

## 2020-04-15 ENCOUNTER — Ambulatory Visit: Payer: 59 | Admitting: Family Medicine

## 2020-05-04 ENCOUNTER — Other Ambulatory Visit: Payer: Self-pay

## 2020-05-04 ENCOUNTER — Emergency Department
Admission: EM | Admit: 2020-05-04 | Discharge: 2020-05-04 | Disposition: A | Discharge status: Home / Self Care | Payer: 59 | Attending: Emergency Medicine | Admitting: Emergency Medicine

## 2020-05-04 DIAGNOSIS — B9789 Other viral agents as the cause of diseases classified elsewhere: Secondary | ICD-10-CM

## 2020-05-04 DIAGNOSIS — M79604 Pain in right leg: Secondary | ICD-10-CM | POA: Insufficient documentation

## 2020-05-04 DIAGNOSIS — J029 Acute pharyngitis, unspecified: Secondary | ICD-10-CM | POA: Insufficient documentation

## 2020-05-04 MED ORDER — PREDNISONE 20 MG PO TABS
60.0000 mg | ORAL_TABLET | Freq: Once | ORAL | Status: AC
  Administered 2020-05-04: 60 mg via ORAL
  Filled 2020-05-04: qty 3

## 2020-05-04 MED ORDER — PREDNISONE 10 MG PO TABS: 10.0000 mg | ORAL_TABLET | Freq: Every day | ORAL | 0 refills | Status: DC

## 2020-05-04 NOTE — Discharge Instructions (Addendum)
Please take prednisone as prescribed.  You may take Tylenol for additional pain relief.  Please follow-up with your orthopedist.  Return to the ER for any difficulty swallowing, fevers, swelling, worsening symptoms or urgent changes in your health.

## 2020-05-04 NOTE — ED Notes (Signed)
Pt states that he has throat tightness and a pain in his jaw/throat. Patient states he's here for pain in his right leg and states his back has also been sore.
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
## 2020-05-06 ENCOUNTER — Other Ambulatory Visit: Payer: Self-pay

## 2020-05-06 ENCOUNTER — Emergency Department
Admission: EM | Admit: 2020-05-06 | Discharge: 2020-05-06 | Disposition: A | Discharge status: Home / Self Care | Payer: Medicaid Other | Attending: Emergency Medicine | Admitting: Emergency Medicine

## 2020-05-06 DIAGNOSIS — F458 Other somatoform disorders: Secondary | ICD-10-CM

## 2020-05-06 NOTE — ED Provider Notes (Signed)
[facility] Emergency Department Provider Note ____________________________________________   First MD Initiated Contact with Patient 05/06/20 1353     (approximate)  I have reviewed the triage vital signs and the nursing notes.   HISTORY  Chief Complaint Other (Throat Problem)  HPI Nicholas Johnson. is a 29 y.o. male with a history of psychosis who presents to the emergency department for treatment and evaluation of multiple medical issues. His main concern is his throat. 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 He was evaluated by ENT at that time but has not been back. Over the past week, he has been feeling that same sensation. He denies pain, but has a weird sensation when he swallows. No alleviating measures prior to arrival.         Past Medical History:  Diagnosis Date  . Nasal congestion   . Parotid gland pain   . Psychosis [facility])    admitted 12/15    Patient Active Problem List   Diagnosis Date Noted  . 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 Altered mental status   . Psychoses (HCC)   . Parotid gland pain   . Nasal congestion     Past Surgical History:  Procedure Laterality Date  . WISDOM TOOTH EXTRACTION     25     Prior to Admission medications   Medication Sig Start Date End Date Taking? Authorizing Provider  diclofenac Sodium (VOLTAREN) 1 % GEL Apply 2 g topically 4 (four) times daily. Patient not taking: Reported on 03/25/2020 02/27/20   Muthersbaugh, Dahlia Client, PA-C  HYDROcodone-acetaminophen (NORCO/VICODIN) 5-325 MG tablet Take 1 tablet by mouth every 6 (six) hours as needed for severe pain. 03/12/20   [place], Velna Hatchet, MD  methocarbamol (ROBAXIN) 500 MG tablet Take 1 tablet (500 mg total) by mouth 2 (two) times daily. Patient not taking: Reported on 03/25/2020 03/01/20   Mannie Stabile, PA-C  naproxen (NAPROSYN) 500 MG tablet Take 1 tablet (500 mg total) by mouth 2 (two) times daily. Patient  not taking: Reported on 03/25/2020 03/01/20   Mannie Stabile, PA-C  predniSONE (DELTASONE) 10 MG tablet Take 1 tablet (10 mg total) by mouth daily. 6,5,4,3,2,1 six day taper 05/04/20   Evon Slack, PA-C    Allergies Patient has no known allergies.  Family History  Problem Relation Age of Onset  . Colon cancer Neg Hx   . Esophageal cancer Neg Hx   . Pancreatic cancer Neg Hx   . Stomach cancer Neg Hx   . Liver disease Neg Hx     Social History Social History   Tobacco Use  . Smoking status: Never Smoker  . Smokeless tobacco: Never Used  Vaping Use  . Vaping Use: Never used  Substance Use Topics  . Alcohol use: No  . Drug use: Not Currently    Types: Marijuana    Comment: several years ago as of 01/07/20    Review of Systems  Constitutional: No fever/chills Eyes: No visual changes. ENT: No sore throat. Weird sensation when swallowing. Cardiovascular: Denies chest pain. Respiratory: Denies shortness of breath. Gastrointestinal: No abdominal pain.  No nausea, no vomiting.  No diarrhea.  No constipation. Genitourinary: Negative for dysuria. Musculoskeletal: Positive for back and right leg pain. Skin: Negative for rash. Neurological: Negative for headaches, focal weakness or numbness. ____________________________________________   PHYSICAL EXAM:  VITAL SIGNS: ED Triage Vitals  Enc Vitals Group     BP 05/06/20 1335 121/72  Pulse Rate 05/06/20 1335 65     Resp 05/06/20 1335 16     Temp 05/06/20 1335 98.6 F (37 C)     Temp Source 05/06/20 1335 Oral     SpO2 05/06/20 1335 98 %     Weight 05/06/20 1336 130 lb 1.1 oz (59 kg)     Height 05/06/20 1336 5\' 7"  (1.702 m)     Head Circumference --      Peak Flow --      Pain Score 05/06/20 1335 0     Pain Loc --      Pain Edu? --      Excl. in GC? --     Constitutional: Alert and oriented. Well appearing and in no acute distress. Eyes: Conjunctivae are normal. PERRL. EOMI. Head: Atraumatic. Nose: No  congestion/rhinnorhea. Mouth/Throat: Mucous membranes are moist.  Oropharynx non-erythematous.  Neck: No stridor.   Hematological/Lymphatic/Immunilogical: No cervical lymphadenopathy. Cardiovascular: Normal rate, regular rhythm. Grossly normal heart sounds.  Good peripheral circulation. Respiratory: Normal respiratory effort.  No retractions. Lungs CTAB. Gastrointestinal: Soft and nontender. No distention. No abdominal bruits. No CVA tenderness. Genitourinary:  Musculoskeletal: No lower extremity tenderness nor edema.  No joint effusions. Neurologic:  Normal speech and language. No gross focal neurologic deficits are appreciated. No gait instability. Skin:  Skin is warm, dry and intact. No rash noted. Psychiatric: Flight of thoughts without SI or HI  ____________________________________________   LABS (all labs ordered are listed, but only abnormal results are displayed)  Labs Reviewed - No data to display ____________________________________________  EKG  Not indicated ____________________________________________  RADIOLOGY  ED MD interpretation:    Not indicated.  I, 05/08/20, personally viewed and evaluated these images (plain radiographs) as part of my medical decision making, as well as reviewing the written report by the radiologist.  Official radiology report(s): No results found.  ____________________________________________   PROCEDURES  Procedure(s) performed (including Critical Care):  Procedures  ____________________________________________   INITIAL IMPRESSION / ASSESSMENT AND PLAN     29 year old male presenting to the emergency department for treatment and evaluation of strange sensation in his throat.  See HPI for further details.  ED COURSE  Patient displaying flight of thoughts.  Physical exam is reassuring.  He was offered medication to treat what I suspect to be globus hystericus but he refuses medications.  He would like to follow-up  with the ENT doctor and will be provided with a referral. ____________________________________________   FINAL CLINICAL IMPRESSION(S) / ED DIAGNOSES  Final diagnoses:  Globus hystericus     ED Discharge Orders    None       Nicholas Johnson. was evaluated in Emergency Department on 05/06/2020 for the symptoms described in the history of present illness. He was evaluated in the context of the global COVID-19 pandemic, which necessitated consideration that the patient might be at risk for infection with the SARS-CoV-2 virus that causes COVID-19. Institutional protocols and algorithms that pertain to the evaluation of patients at risk for COVID-19 are in a state of rapid change based on information released by regulatory bodies including the CDC and federal and state organizations. These policies and algorithms were followed during the patient's care in the ED.   Note:  This document was prepared using Dragon voice recognition software and may include unintentional dictation errors.   05/08/2020, FNP 05/06/20 1419    05/08/20, MD 05/06/20 [phone]

## 2020-05-06 NOTE — ED Triage Notes (Signed)
Reports he is having a throat problem where he feels his lymph nodes are swollen but denies sore throat. This has been ongoing for last week and is recurrent. Has seen ENT for same. Speaks in full and complete sentences without difficulty.

## 2020-05-10 ENCOUNTER — Telehealth: Payer: Self-pay | Admitting: Family Medicine

## 2020-05-10 NOTE — Telephone Encounter (Signed)
Would like a return call it is in reference to his injury. He was seen in the ER and they suggest that it could be nerve damage. He requested if there is any kind of pain medication he could get or if there was any medication he can get. He was prescribed hydrocodone but he wasn't sure if that was the best type of medication.  He is scheduled for never conduction test in two weeks. He can't go there before his insurance kicks in.  CB# [phone]

## 2020-05-10 NOTE — Telephone Encounter (Signed)
Per ER note 7/13, they recommended follow up with orthopedics.  I do not see any tests that showed nerve damage.

## 2020-05-11 NOTE — Telephone Encounter (Signed)
I would be glad to see him to evaluate because I have found no explanation for his pain thus far.

## 2020-05-11 NOTE — Telephone Encounter (Signed)
Call placed to patient and patient made aware.   States that he has no insurance at this time and since he is no longer working, he will not have any funds to pay out of pocket to see MD.   Nicholas Johnson that if he truly does not have prescription from ER from 7/13 to contact them and see if prescription can be sent to pharmacy.

## 2020-05-11 NOTE — Telephone Encounter (Signed)
Call placed  to patient to discuss.   Reports that he has been seen in the past from [facility], but he stopped seeing them once his insurance was cancelled.   Reports that he continues to be in pain. States that he was not given prescription for Pred taper from [facility].   Requesting prescription for pain/ inflammation.   MD please advise.

## 2020-05-12 ENCOUNTER — Telehealth: Payer: Self-pay

## 2020-05-12 NOTE — Telephone Encounter (Signed)
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 Pt reports that he is on crutches and can't walk far. I advised pt to schedule an appt. Pt states he does not have insurance and can not come into the office. Please advise.

## 2020-05-13 NOTE — Telephone Encounter (Signed)
He is not handicapped.  No study that has been performed thus far has shown any medical problem with his leg or that would keep him from walking.

## 2020-05-13 NOTE — Telephone Encounter (Signed)
Noted  

## 2020-05-25 ENCOUNTER — Other Ambulatory Visit: Payer: Self-pay | Admitting: Family Medicine

## 2020-05-25 NOTE — Telephone Encounter (Signed)
Ok to refill??  Last office visit 03/25/2020.  Last refill 03/12/2020.

## 2020-05-25 NOTE — Telephone Encounter (Signed)
[id number] [place], [place] - [address] Refill Hydrocodone

## 2020-05-28 ENCOUNTER — Ambulatory Visit: Payer: 59 | Admitting: Family Medicine

## 2020-06-10 DIAGNOSIS — M25551 Pain in right hip: Secondary | ICD-10-CM | POA: Insufficient documentation

## 2020-06-10 DIAGNOSIS — M545 Low back pain, unspecified: Secondary | ICD-10-CM

## 2020-06-10 HISTORY — DX: Pain in right hip: M25.551

## 2020-06-10 HISTORY — DX: Low back pain, unspecified: M54.50

## 2020-06-21 ENCOUNTER — Other Ambulatory Visit: Payer: Self-pay | Admitting: Otolaryngology

## 2020-06-21 DIAGNOSIS — Q313 Laryngocele: Secondary | ICD-10-CM

## 2020-06-29 ENCOUNTER — Ambulatory Visit: Payer: 59

## 2020-06-29 ENCOUNTER — Other Ambulatory Visit: Payer: Self-pay

## 2020-06-30 ENCOUNTER — Other Ambulatory Visit: Payer: Self-pay

## 2020-06-30 ENCOUNTER — Ambulatory Visit
Admission: RE | Admit: 2020-06-30 | Discharge: 2020-06-30 | Disposition: A | Discharge status: Home / Self Care | Payer: 59 | Source: Ambulatory Visit | Attending: Otolaryngology | Admitting: Otolaryngology

## 2020-06-30 DIAGNOSIS — Q313 Laryngocele: Secondary | ICD-10-CM | POA: Insufficient documentation

## 2020-06-30 MED ORDER — IOHEXOL 300 MG/ML  SOLN
75.0000 mL | Freq: Once | INTRAMUSCULAR | Status: AC | PRN
  Administered 2020-06-30: 75 mL via INTRAVENOUS

## 2020-07-02 ENCOUNTER — Emergency Department
Admission: EM | Admit: 2020-07-02 | Discharge: 2020-07-02 | Disposition: A | Discharge status: Home / Self Care | Payer: 59 | Attending: Emergency Medicine | Admitting: Emergency Medicine

## 2020-07-02 ENCOUNTER — Other Ambulatory Visit: Payer: Self-pay

## 2020-07-02 ENCOUNTER — Encounter: Payer: Self-pay | Admitting: Emergency Medicine

## 2020-07-02 DIAGNOSIS — M791 Myalgia, unspecified site: Secondary | ICD-10-CM | POA: Insufficient documentation

## 2020-07-02 DIAGNOSIS — R202 Paresthesia of skin: Secondary | ICD-10-CM | POA: Insufficient documentation

## 2020-07-02 DIAGNOSIS — Z79899 Other long term (current) drug therapy: Secondary | ICD-10-CM | POA: Insufficient documentation

## 2020-07-02 DIAGNOSIS — R4182 Altered mental status, unspecified: Secondary | ICD-10-CM | POA: Insufficient documentation

## 2020-07-02 DIAGNOSIS — M79604 Pain in right leg: Secondary | ICD-10-CM | POA: Diagnosis not present

## 2020-07-02 MED ORDER — GABAPENTIN 100 MG PO CAPS
ORAL_CAPSULE | ORAL | 0 refills | Status: DC
Start: 2020-07-02 — End: 2020-09-15

## 2020-07-02 NOTE — Discharge Instructions (Addendum)
Please follow-up with your orthopedist to discuss further testing to help identify your right leg pain.  You may increase gabapentin to 600 mg at nighttime and continue with 300 mg in the morning.  Please start taking your tramadol 50 mg every 6 hours as needed for pain.  Please finish your prednisone taper.  Return to the ER for any worsening symptoms or urgent changes in your health.
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
## 2020-07-02 NOTE — ED Provider Notes (Signed)
[facility] EMERGENCY DEPARTMENT Provider Note   CSN: [id number] Arrival date & time: 07/02/20  1959     History Chief Complaint  Patient presents with  . Leg Pain    Nicholas Johnson. is a 29 y.o. male presents to the emergency department for evaluation of chronic right leg pain from a Worker's Comp. injury that occurred on February 05, 2020.  Patient states he has had extensive work-up consisting of MRI of his lumbar spine, right hip, right knee as well as nerve conduction studies of the lower extremity showing no explanation for his leg pain.  He describes pain in the right buttocks radiating down the posterior thigh into the knee as well as into the lower leg and calf.  He describes burning numbness and tingling.  He is currently taking a 10-day steroid taper, has been on this medication for 3 days.  He is also taking gabapentin 300 mg at bedtime and 300 mg in the morning.  He has been prescribed tramadol but has not been taking this medication.  He comes in today because he states he is having a lot of pain and discomfort.  No new trauma or injury.  No fevers warmth redness or swelling.  No history of blood clots.  He denies any acute changes to his chronic condition.  Patient also mentions he has seen a neurologist and had MRI of his brain that was normal.  HPI     Past Medical History:  Diagnosis Date  . Nasal congestion   . Parotid gland pain   . Psychosis [facility])    admitted 12/15    Patient Active Problem List   Diagnosis Date Noted  . 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 Altered mental status   . Psychoses (HCC)   . Parotid gland pain   . Nasal congestion     Past Surgical History:  Procedure Laterality Date  . WISDOM TOOTH EXTRACTION     25        Family History  Problem Relation Age of Onset  . Colon cancer Neg Hx   . Esophageal cancer Neg Hx   . Pancreatic cancer Neg Hx   . Stomach cancer Neg Hx   . Liver disease Neg Hx     Social History    Tobacco Use  . Smoking status: Never Smoker  . Smokeless tobacco: Never Used  Vaping Use  . Vaping Use: Never used  Substance Use Topics  . Alcohol use: No  . Drug use: Not Currently    Types: Marijuana    Comment: several years ago as of 01/07/20    Home Medications Prior to Admission medications   Medication Sig Start Date End Date Taking? Authorizing Provider  diclofenac Sodium (VOLTAREN) 1 % GEL Apply 2 g topically 4 (four) times daily. Patient not taking: Reported on 03/25/2020 02/27/20   Muthersbaugh, Dahlia Client, PA-C  gabapentin (NEURONTIN) 100 MG capsule Take 600 mg nightly and 300 mg every morning 07/02/20   Evon Slack, PA-C  HYDROcodone-acetaminophen (NORCO/VICODIN) 5-325 MG tablet Take 1 tablet by mouth every 6 (six) hours as needed for severe pain. 03/12/20   [place], Velna Hatchet, MD  methocarbamol (ROBAXIN) 500 MG tablet Take 1 tablet (500 mg total) by mouth 2 (two) times daily. Patient not taking: Reported on 03/25/2020 03/01/20   Mannie Stabile, PA-C  naproxen (NAPROSYN) 500 MG tablet Take 1 tablet (500 mg total) by mouth 2 (two) times daily. Patient not taking: Reported on 03/25/2020  03/01/20   Mannie Stabile, PA-C  predniSONE (DELTASONE) 10 MG tablet Take 1 tablet (10 mg total) by mouth daily. 6,5,4,3,2,1 six day taper 05/04/20   Evon Slack, PA-C    Allergies    Patient has no known allergies.  Review of Systems   Review of Systems  Constitutional: Negative for chills and fever.  Respiratory: Negative for cough, shortness of breath, wheezing and stridor.   Cardiovascular: Negative for chest pain, palpitations and leg swelling.  Gastrointestinal: Negative for abdominal pain, diarrhea, nausea and vomiting.  Musculoskeletal: Positive for arthralgias, gait problem and myalgias. Negative for joint swelling, neck pain and neck stiffness.  Skin: Negative for rash and wound.  Neurological: Positive for numbness. Negative for headaches.    Physical Exam Updated  Vital Signs BP 120/89 (BP Location: Left Arm)   Pulse 81   Temp 98.9 F (37.2 C) (Oral)   Resp 18   Ht 5\' 7"  (1.702 m)   Wt 58.1 kg   SpO2 99%   BMI 20.05 kg/m   Physical Exam Constitutional:      Appearance: He is well-developed.  HENT:     Head: Normocephalic and atraumatic.  Eyes:     Conjunctiva/sclera: Conjunctivae normal.  Cardiovascular:     Rate and Rhythm: Normal rate.  Pulmonary:     Effort: Pulmonary effort is normal. No respiratory distress.  Musculoskeletal:        General: Normal range of motion.     Cervical back: Normal range of motion.     Comments: Lumbar Spine: Examination of the lumbar spine reveals no bony abnormality, no edema, and no ecchymosis.  There is no step off.  The patient has full range of motion of the lumbar spine with flexion and extension.  The patient has normal lateral bend and rotation.  The patient has no pain with range of motion activities.  The patient has a negative axial load test, and a negative rotational Waddell test.  The patient is non tender along the spinous process.  The patient is non tender along the paravertebral muscles, with no muscle spasms.  The patient is non tender along the iliac crest.  The patient is non tender in the sciatic notch.  The patient is non tender along the Sacroiliac joint.  There is no Coccyx joint tenderness.    Right lower Extremities: Examination of the right lower extremity reveals no bony abnormality, no edema, and no ecchymosis.  The patient has full active and passive range of motion of the hips, knees, and ankles.  There is no discomfort with range of motion exercises.  The patient is non tender along the greater trochanter region.  The patient has a negative ' test bilaterally.  There is normal skin warmth.  No knee effusion.  There is normal capillary refill bilaterally.    Neurologic: The patient has a negative straight leg raise bilaterally.  The patient has normal muscle strength testing  for the quadriceps, calves, ankle dorsiflexion, ankle plantarflexion, and extensor hallicus longus.  The patient has sensation that is intact to light touch.  Bilateral patellar tendon reflexes are normal with no clonus noted.   Skin:    General: Skin is warm.     Findings: No rash.  Neurological:     Mental Status: He is alert and oriented to person, place, and time.  Psychiatric:        Behavior: Behavior normal.        Thought Content: Thought content normal.  ED Results / Procedures / Treatments   Labs (all labs ordered are listed, but only abnormal results are displayed) Labs Reviewed - No data to display  EKG None  Radiology No results found.  Procedures Procedures (including critical care time)  Medications Ordered in ED Medications - No data to display  ED Course  I have reviewed the triage vital signs and the nursing notes.  Pertinent labs & imaging results that were available during my care of the patient were reviewed by me and considered in my medical decision making (see chart for details).    MDM Rules/Calculators/A&P                          29 year old male with chronic right leg pain from a Worker's Comp. injury back in April 2021.  Notes reviewed showing reports of normal MRI of the lumbar spine, right knee.  Reports of normal nerve conduction studies reviewed.  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  Patient is educated on current medication regimen.  He can increase his gabapentin to 600 mg at night from 300 mg.  He can continue with 300 mg of gabapentin in the morning.  Would recommend he also finish out his prednisone taper and start taking tramadol which she had not been taking.  Patient will follow up with his orthopedist and he understands signs symptoms return to the ER for.  Patient's vital signs are stable and he has no abnormal physical exam findings on exam today. Final Clinical  Impression(s) / ED Diagnoses Final diagnoses:  Right leg pain    Rx / DC Orders ED Discharge Orders         Ordered    gabapentin (NEURONTIN) 100 MG capsule        07/02/20 2226           Evon Slack, PA-C 07/02/20 2234    Phineas Semen, MD 07/02/20 2245

## 2020-07-04 ENCOUNTER — Other Ambulatory Visit: Payer: Self-pay

## 2020-07-04 ENCOUNTER — Emergency Department
Admission: EM | Admit: 2020-07-04 | Discharge: 2020-07-04 | Disposition: A | Discharge status: Home / Self Care | Payer: 59 | Attending: Emergency Medicine | Admitting: Emergency Medicine

## 2020-07-04 ENCOUNTER — Encounter: Payer: Self-pay | Admitting: Emergency Medicine

## 2020-07-04 DIAGNOSIS — M25522 Pain in left elbow: Secondary | ICD-10-CM | POA: Insufficient documentation

## 2020-07-04 DIAGNOSIS — G8929 Other chronic pain: Secondary | ICD-10-CM | POA: Diagnosis not present

## 2020-07-04 DIAGNOSIS — Z79899 Other long term (current) drug therapy: Secondary | ICD-10-CM | POA: Insufficient documentation

## 2020-07-04 NOTE — ED Notes (Signed)
Pt with c/o left elbow pain, requesting CT of elbows.

## 2020-07-04 NOTE — ED Provider Notes (Signed)
[facility] Emergency Department Provider Note  ____________________________________________   First MD Initiated Contact with Patient 07/04/20 1059     (approximate)  I have reviewed the triage vital signs and the nursing notes.   HISTORY  Chief Complaint Elbow Pain    HPI Nicholas Peel. is a 29 y.o. male presents emergency department complaining of left elbow pain.  Patient is requesting CTs of both elbows.  Has a chronic ongoing leg problem that he was refused Worker's Comp. on for about 4 months.  Is been using crutches because it hurts to walk on his leg.  Has a regular orthopedist and neurologist for these problems.  States he came here to get CTs.    Past Medical History:  Diagnosis Date  . Nasal congestion   . Parotid gland pain   . Psychosis [facility])    admitted 12/15    Patient Active Problem List   Diagnosis Date Noted  . 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 Altered mental status   . Psychoses (HCC)   . Parotid gland pain   . Nasal congestion     Past Surgical History:  Procedure Laterality Date  . WISDOM TOOTH EXTRACTION     25     Prior to Admission medications   Medication Sig Start Date End Date Taking? Authorizing Provider  diclofenac Sodium (VOLTAREN) 1 % GEL Apply 2 g topically 4 (four) times daily. Patient not taking: Reported on 03/25/2020 02/27/20   Muthersbaugh, Dahlia Client, PA-C  gabapentin (NEURONTIN) 100 MG capsule Take 600 mg nightly and 300 mg every morning 07/02/20   Evon Slack, PA-C  HYDROcodone-acetaminophen (NORCO/VICODIN) 5-325 MG tablet Take 1 tablet by mouth every 6 (six) hours as needed for severe pain. 03/12/20   [place], Velna Hatchet, MD  methocarbamol (ROBAXIN) 500 MG tablet Take 1 tablet (500 mg total) by mouth 2 (two) times daily. Patient not taking: Reported on 03/25/2020 03/01/20   Mannie Stabile, PA-C  naproxen (NAPROSYN) 500 MG tablet Take 1 tablet (500 mg total) by mouth 2 (two) times daily. Patient not  taking: Reported on 03/25/2020 03/01/20   Mannie Stabile, PA-C  predniSONE (DELTASONE) 10 MG tablet Take 1 tablet (10 mg total) by mouth daily. 6,5,4,3,2,1 six day taper 05/04/20   Evon Slack, PA-C    Allergies Patient has no known allergies.  Family History  Problem Relation Age of Onset  . Colon cancer Neg Hx   . Esophageal cancer Neg Hx   . Pancreatic cancer Neg Hx   . Stomach cancer Neg Hx   . Liver disease Neg Hx     Social History Social History   Tobacco Use  . Smoking status: Never Smoker  . Smokeless tobacco: Never Used  Vaping Use  . Vaping Use: Never used  Substance Use Topics  . Alcohol use: No  . Drug use: Not Currently    Types: Marijuana    Comment: several years ago as of 01/07/20    Review of Systems  Constitutional: No fever/chills Eyes: No visual changes. ENT: No sore throat. Respiratory: Denies cough Cardiovascular: Denies chest pain Gastrointestinal: Denies abdominal pain Genitourinary: Negative for dysuria. Musculoskeletal: Negative for back pain.  Positive elbow pain Skin: Negative for rash. Psychiatric: no mood changes,     ____________________________________________   PHYSICAL EXAM:  VITAL SIGNS: ED Triage Vitals  Enc Vitals Group     BP 07/04/20 1046 125/74     Pulse Rate 07/04/20 1046 74  Resp 07/04/20 1046 16     Temp 07/04/20 1046 99.4 F (37.4 C)     Temp Source 07/04/20 1046 Oral     SpO2 07/04/20 1046 98 %     Weight 07/04/20 1045 128 lb (58.1 kg)     Height 07/04/20 1045 5\' 7"  (1.702 m)     Head Circumference --      Peak Flow --      Pain Score 07/04/20 1044 7     Pain Loc --      Pain Edu? --      Excl. in GC? --     Constitutional: Alert and oriented. Well appearing and in no acute distress. Eyes: Conjunctivae are normal.  Head: Atraumatic. Nose: No congestion/rhinnorhea. Mouth/Throat: Mucous membranes are moist.   Neck:  supple no lymphadenopathy noted Cardiovascular: Normal rate, regular  rhythm.  Respiratory: Normal respiratory effort.  No retractions,  GU: deferred Musculoskeletal: FROM all extremities, warm and well perfused, clicking noted in the right elbow ligament moves.  Neurovascular intact. Neurologic:  Normal speech and language.  Skin:  Skin is warm, dry and intact. No rash noted. Psychiatric: Mood and affect are normal. Speech and behavior are normal.  ____________________________________________   LABS (all labs ordered are listed, but only abnormal results are displayed)  Labs Reviewed - No data to display ____________________________________________   ____________________________________________  RADIOLOGY    ____________________________________________   PROCEDURES  Procedure(s) performed: No  Procedures    ____________________________________________   INITIAL IMPRESSION / ASSESSMENT AND PLAN / ED COURSE  Pertinent labs & imaging results that were available during my care of the patient were reviewed by me and considered in my medical decision making (see chart for details).   .  Patient is 29 year old male presents emergency department with vague complaints about elbow pain.  See HPI.  Physical exam is unremarkable  Did explain findings to the patient.  Told her these are chronic problems which we did not address emergency department.  He already has an orthopedic he has an appointment with for tomorrow should follow-up with them.  He was seen here 2 days ago for the same complaints.  CTs are not appropriate at this time.  He should follow-up with his regular doctors.  Is discharged stable condition.     Nicholas Guinta. was evaluated in Emergency Department on 07/04/2020 for the symptoms described in the history of present illness. He was evaluated in the context of the global COVID-19 pandemic, which necessitated consideration that the patient might be at risk for infection with the SARS-CoV-2 virus that causes COVID-19.  Institutional protocols and algorithms that pertain to the evaluation of patients at risk for COVID-19 are in a state of rapid change based on information released by regulatory bodies including the CDC and federal and state organizations. These policies and algorithms were followed during the patient's care in the ED.    As part of my medical decision making, I reviewed the following data within the electronic MEDICAL RECORD NUMBER Nursing notes reviewed and incorporated, Old chart reviewed, Notes from prior ED visits and [place] Controlled Substance Database  ____________________________________________   FINAL CLINICAL IMPRESSION(S) / ED DIAGNOSES  Final diagnoses:  Other chronic pain      NEW MEDICATIONS STARTED DURING THIS VISIT:  New Prescriptions   No medications on file     Note:  This document was prepared using Dragon voice recognition software and may include unintentional dictation errors.    09/03/2020,  PA-C 07/04/20 1153    Gilles Chiquito, MD 07/04/20 1334

## 2020-07-04 NOTE — ED Triage Notes (Signed)
Pt states pain to elbow. Pt states "my arms have been hurting me a lot since I've been on these crutches for the last 4 months". Pt states he never got his arms looked at by ortho but he needs to do that. Pt c/o pain to L elbow and wrist.

## 2020-07-04 NOTE — ED Notes (Signed)
See triage note. Elbow pain after using crutches for months.

## 2020-07-04 NOTE — ED Notes (Signed)
Pt asking this RN about getting a CT scan for elbows. Pt told that this is only ordered by EDP if necessary. Pt with c/o right elbow and right leg and generalized nerve pain. Pt in room lifting arms, ROM intact. Pt in NAD. Pt encouraged to follow up with his primary care, ortho, and neuro as recommended by EDP.

## 2020-07-09 DIAGNOSIS — S8992XA Unspecified injury of left lower leg, initial encounter: Secondary | ICD-10-CM

## 2020-07-09 HISTORY — DX: Unspecified injury of left lower leg, initial encounter: S89.92XA

## 2020-07-20 DIAGNOSIS — R29898 Other symptoms and signs involving the musculoskeletal system: Secondary | ICD-10-CM | POA: Insufficient documentation

## 2020-07-22 ENCOUNTER — Institutional Professional Consult (permissible substitution): Payer: 59 | Admitting: Neurology

## 2020-07-29 ENCOUNTER — Other Ambulatory Visit: Payer: Self-pay | Admitting: Otolaryngology

## 2020-07-29 DIAGNOSIS — R1312 Dysphagia, oropharyngeal phase: Secondary | ICD-10-CM

## 2020-08-05 ENCOUNTER — Other Ambulatory Visit: Payer: Self-pay

## 2020-08-05 ENCOUNTER — Ambulatory Visit
Admission: RE | Admit: 2020-08-05 | Discharge: 2020-08-05 | Disposition: A | Discharge status: Home / Self Care | Payer: 59 | Source: Ambulatory Visit | Attending: Otolaryngology | Admitting: Otolaryngology

## 2020-08-05 DIAGNOSIS — R1312 Dysphagia, oropharyngeal phase: Secondary | ICD-10-CM | POA: Insufficient documentation

## 2020-08-05 DIAGNOSIS — R1310 Dysphagia, unspecified: Secondary | ICD-10-CM | POA: Diagnosis present

## 2020-08-05 NOTE — Therapy (Signed)
[facility] DIAGNOSTIC RADIOLOGY [address] [place], [place], [address] Phone: [phone]   Fax:     Modified Barium Swallow  Patient Details  Name: Nicholas Johnson. MRN: [id number] Date of Birth: 17-Nov-1990 No data recorded  Encounter Date: 08/05/2020   End of Session - 08/05/20 1550    Visit Number 1    Number of Visits 1    Date for SLP Re-Evaluation 08/05/20    SLP Start Time 1300    SLP Stop Time  1400    SLP Time Calculation (min) 60 min    Activity Tolerance Patient tolerated treatment well           Past Medical History:  Diagnosis Date  . Nasal congestion   . Parotid gland pain   . Psychosis (HCC)    admitted 12/15    Past Surgical History:  Procedure Laterality Date  . WISDOM TOOTH EXTRACTION     25     There were no vitals filed for this visit.        Subjective: Patient behavior: (alertness, ability to follow instructions, etc.): Noted medical history per H/P. Pt described a "fullness", "thickness" in his throat moreso when he ate bulky foods such as meats, breads. He denied any difficulty drinking liquids. He denied any difficulty swallowing his Pills at home -- stated he takes "pain medications" naming a few.  He endorsed feelings of oral dryness, possible "dehydration", nighttime Xerostomia, and stated he did not "drink enough water". He also endorsed c/o a "popping" noise in his throat at times and that he "can hear it". 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 He denied changes in his diet or recent weight loss; he denied any neurological history but endorsed significant leg "pain" after an "accident"(he utilized crutches and is followed by PT, he stated).  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 Pt appears to present w/ some Anxiety re: his situation and medical issues.  Chief complaint: dysphagia  Native Dentition; OM Exam  WFL.    Objective:  Radiological Procedure: A videoflouroscopic evaluation of oral-preparatory, reflex initiation, and pharyngeal phases of the swallow was performed; as well as a screening of the upper esophageal phase.  I. POSTURE: upright II. VIEW: lateral III. COMPENSATORY STRATEGIES: none IV. BOLUSES ADMINISTERED:  Thin Liquid: 9 trials, multiple  Nectar-thick Liquid: 1 trial  Honey-thick Liquid: NT  Puree: 2 trials  Mechanical Soft: 3 trials V. RESULTS OF EVALUATION: A. ORAL PREPARATORY PHASE: (The lips, tongue, and velum are observed for strength and coordination)       **Overall Severity Rating: WFL.  B. SWALLOW INITIATION/REFLEX: (The reflex is normal if "triggered" by the time the bolus reached the base of the tongue)  **Overall Severity Rating: [facility].  C. PHARYNGEAL PHASE: (Pharyngeal function is normal if the bolus shows rapid, smooth, and continuous transit through the pharynx and there is no pharyngeal residue after the swallow)  **Overall Severity Rating: [facility].  D. LARYNGEAL PENETRATION: (Material entering into the laryngeal inlet/vestibule but not aspirated): NONE E. ASPIRATION: NONE F. ESOPHAGEAL PHASE: (Screening of the upper esophagus): none noted during Cervical Esophageal scanning   ASSESSMENT: Pt appears to present w/ No oropharyngeal phase dysphagia; No neuromuscular deficits of swallowing during this evaluation. No aspiration or penetration of po trials was noted to occur during this study. No Esophageal dysmotility or dysmotility of bolus consistencies clearing the UES was noted; consistencies  were single and moistened though(no meats, bread). Any potential Esophageal dysmotility (w/ certain foods) could impact the pharyngoesophageal phase of swallowing. Recommend f/u w/ GI to r/o potential Esophageal Dysmotility, and/or Reflux issues. Pt does have a level of Anxiety re: his medical issues including eating of foods, swallowing.  During the oral phase, timely bolus  management, mastication, and oral control of bolus propulsion for A-P transfer occurred. Oral clearing achieved w/ all trial consistencies. During the pharyngeal phase, Timely pharyngeal swallow initiation noted w/ all trial consistencies. No aspiration or penetration occurred; airway closure appeared timely, tight. No pharyngeal residue remained post swallow indicating adequate laryngeal excursion and pharyngeal pressure during the swallow.  Discussed results of MBSS and the impact of any potential Esophageal dysmotility on his overall swallowing, such as Reflux activity. Video viewed together and questions answered. Recommended f/u w/ GI to r/o Reflux; education. Recommended monitoring nighttime Xerostomia, strategies to include increased hydration during the Day(keeping a small bottle of water w/ him at all times, ice chips, jello, fruits).    PLAN/RECOMMENDATIONS:  A. Diet: Regular, thin liquids.   B. Swallowing Precautions: general aspiration precautions  C. Recommended consultation to GI to r/o possible Reflux issues; ENT for Xerostomia  D. Therapy recommendations: None  E. Results and recommendations were discussed w/ pt; video viewed and questions answered. Education discussed w/ pt; pt agreed.            Dysphagia, unspecified type - Plan: DG SWALLOW FUNC SPEECH PATH, DG SWALLOW FUNC SPEECH PATH        Problem List Patient Active Problem List   Diagnosis Date Noted  . 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 Altered mental status   . Psychoses (HCC)   . Parotid gland pain   . Nasal congestion       Jerilynn Som, MS, CCC-SLP Speech Language Pathologist Rehab Services [phone] [place] 08/05/2020, 3:56 PM  [facility] DIAGNOSTIC RADIOLOGY [address] [place], [place], [address] Phone: [phone]   Fax:     Name: Nicholas Johnson. MRN: [id number] Date of Birth: 19-Jun-1991

## 2020-08-17 ENCOUNTER — Ambulatory Visit (INDEPENDENT_AMBULATORY_CARE_PROVIDER_SITE_OTHER): Payer: 59 | Admitting: Otolaryngology

## 2020-08-18 ENCOUNTER — Emergency Department
Admission: EM | Admit: 2020-08-18 | Discharge: 2020-08-18 | Disposition: A | Discharge status: Home / Self Care | Payer: 59 | Attending: Emergency Medicine | Admitting: Emergency Medicine

## 2020-08-18 ENCOUNTER — Other Ambulatory Visit: Payer: Self-pay

## 2020-08-18 DIAGNOSIS — Z5321 Procedure and treatment not carried out due to patient leaving prior to being seen by health care provider: Secondary | ICD-10-CM | POA: Insufficient documentation

## 2020-08-18 DIAGNOSIS — R519 Headache, unspecified: Secondary | ICD-10-CM | POA: Insufficient documentation

## 2020-08-18 NOTE — ED Triage Notes (Signed)
Pt to ED for headache for a few months.  Denies vision changes.  Reports taking tylenol and ibuprofen with no relief.  + light sensitivity and sound No recent trauma

## 2020-08-19 ENCOUNTER — Ambulatory Visit: Payer: 59 | Admitting: Nurse Practitioner

## 2020-08-19 DIAGNOSIS — M5431 Sciatica, right side: Secondary | ICD-10-CM | POA: Insufficient documentation

## 2020-08-23 ENCOUNTER — Ambulatory Visit (INDEPENDENT_AMBULATORY_CARE_PROVIDER_SITE_OTHER): Payer: 59 | Admitting: Otolaryngology

## 2020-08-27 ENCOUNTER — Encounter: Payer: Self-pay | Admitting: Emergency Medicine

## 2020-08-27 ENCOUNTER — Other Ambulatory Visit: Payer: Self-pay

## 2020-08-27 ENCOUNTER — Emergency Department
Admission: EM | Admit: 2020-08-27 | Discharge: 2020-08-27 | Disposition: A | Discharge status: Home / Self Care | Payer: 59 | Attending: Emergency Medicine | Admitting: Emergency Medicine

## 2020-08-27 DIAGNOSIS — Z5321 Procedure and treatment not carried out due to patient leaving prior to being seen by health care provider: Secondary | ICD-10-CM | POA: Insufficient documentation

## 2020-08-27 DIAGNOSIS — R519 Headache, unspecified: Secondary | ICD-10-CM | POA: Diagnosis not present

## 2020-08-27 NOTE — ED Notes (Signed)
Pt to desk, reports feeling better and now "just ready for bed", pt leaving att

## 2020-08-27 NOTE — ED Triage Notes (Addendum)
Patient with complaint of headaches times one year and half. Patient states that he has an appointment with a neurologist this week. The patient states that the neurologist did an MRI of his head about a month ago and that it was negative. Patient states that he took naproxen and tramadol this evening for his pain.

## 2020-09-02 ENCOUNTER — Ambulatory Visit (INDEPENDENT_AMBULATORY_CARE_PROVIDER_SITE_OTHER): Payer: 59 | Admitting: Otolaryngology

## 2020-09-02 ENCOUNTER — Ambulatory Visit: Payer: 59 | Admitting: Nurse Practitioner

## 2020-09-03 ENCOUNTER — Other Ambulatory Visit: Payer: Self-pay

## 2020-09-03 ENCOUNTER — Encounter (INDEPENDENT_AMBULATORY_CARE_PROVIDER_SITE_OTHER): Payer: Self-pay | Admitting: Otolaryngology

## 2020-09-03 ENCOUNTER — Ambulatory Visit (INDEPENDENT_AMBULATORY_CARE_PROVIDER_SITE_OTHER): Payer: 59 | Admitting: Otolaryngology

## 2020-09-03 VITALS — Temp 97.7°F

## 2020-09-03 DIAGNOSIS — H6983 Other specified disorders of Eustachian tube, bilateral: Secondary | ICD-10-CM | POA: Diagnosis not present

## 2020-09-03 DIAGNOSIS — J310 Chronic rhinitis: Secondary | ICD-10-CM | POA: Diagnosis not present

## 2020-09-03 NOTE — Progress Notes (Signed)
HPI: Nicholas Johnson. is a 29 y.o. male who presents for evaluation of throat and ear complaints.  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  Patient apparently felt a "pop" in the right side of his neck just under his jaw and notices discomfort when he swallows.  He also complains of popping in his ear that comes and goes.  He had a CT scan of his neck performed on June 30, 2020 that apparently was normal.  When he sniffs he feels it in his ear and feels popping in his ear.  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 Patient also describes a lot of pain in his neck and in his head and saw neurology yesterday. He states his ears feel full.  And he feels something abnormal in the back of his nose.  Past Medical History:  Diagnosis Date  . Nasal congestion   . Parotid gland pain   . Psychosis (HCC)    admitted 12/15   Past Surgical History:  Procedure Laterality Date  . WISDOM TOOTH EXTRACTION     25    Social History   Socioeconomic History  . Marital status: Single    Spouse name: Not on file  . Number of children: Not on file  . Years of education: Not on file  . Highest education level: Not on file  Occupational History  . Not on file  Tobacco Use  . Smoking status: Never Smoker  . Smokeless tobacco: Never Used  Vaping Use  . Vaping Use: Never used  Substance and Sexual Activity  . Alcohol use: No  . Drug use: Not Currently    Types: Marijuana    Comment: several years ago as of 01/07/20  . Sexual activity: Yes  Other Topics Concern  . Not on file  Social History Narrative  . Not on file   Social Determinants of Health   Financial Resource Strain:   . Difficulty of Paying Living Expenses: Not on file  Food Insecurity:   . Worried About Programme researcher, broadcasting/film/video in the Last Year: Not on file  . Ran Out of Food in the Last Year: Not on file  Transportation Needs:   .  Lack of Transportation (Medical): Not on file  . Lack of Transportation (Non-Medical): Not on file  Physical Activity:   . Days of Exercise per Week: Not on file  . Minutes of Exercise per Session: Not on file  Stress:   . Feeling of Stress : Not on file  Social Connections:   . Frequency of Communication with Friends and Family: Not on file  . Frequency of Social Gatherings with Friends and Family: Not on file  . Attends Religious Services: Not on file  . Active Member of Clubs or Organizations: Not on file  . Attends Banker Meetings: Not on file  . Marital Status: Not on file   Family History  Problem Relation Age of Onset  . Colon cancer Neg Hx   . Esophageal cancer Neg Hx   . Pancreatic cancer Neg Hx   . Stomach cancer Neg Hx   . Liver disease Neg Hx    No Known Allergies Prior to Admission medications   Medication Sig Start Date End Date Taking? Authorizing Provider  diclofenac Sodium (VOLTAREN) 1 % GEL Apply 2 g topically 4 (four) times daily. 02/27/20  Yes Muthersbaugh,  Dahlia Client, PA-C  gabapentin (NEURONTIN) 100 MG capsule Take 600 mg nightly and 300 mg every morning 07/02/20  Yes Evon Slack, PA-C  HYDROcodone-acetaminophen (NORCO/VICODIN) 5-325 MG tablet Take 1 tablet by mouth every 6 (six) hours as needed for severe pain. 03/12/20  Yes [place], Velna Hatchet, MD  methocarbamol (ROBAXIN) 500 MG tablet Take 1 tablet (500 mg total) by mouth 2 (two) times daily. 03/01/20  Yes [place], Caroline C, PA-C  naproxen (NAPROSYN) 500 MG tablet Take 1 tablet (500 mg total) by mouth 2 (two) times daily. 03/01/20  Yes [place], Merla Riches, PA-C  predniSONE (DELTASONE) 10 MG tablet Take 1 tablet (10 mg total) by mouth daily. 6,5,4,3,2,1 six day taper 05/04/20  Yes Evon Slack, PA-C     Positive ROS: Otherwise negative  All other systems have been reviewed and were otherwise negative with the exception of those mentioned in the HPI and as above.  Physical  Exam: Constitutional: Alert, well-appearing, no acute distress Ears: External ears without lesions or tenderness. Ear canals are clear bilaterally.  Both TMs are clear with good mobility on pneumatic otoscopy and no middle ear abnormality or fluid appreciated on microscopic exam. Nasal: External nose without lesions. Septum relatively midline with mild rhinitis..  After decongesting the nose nasal endoscopy was performed and on nasal endoscopy both middle meatus regions were clear the nasopharynx was clear.  Eustachian tube regions were unobstructed.  He has small adenoid tissue that is nonobstructing. Oral: Lips and gums without lesions. Tongue and palate mucosa without lesions. Posterior oropharynx clear.  He has average sized tonsils bilaterally with no acute exudate.  He has a strong gag reflex so I am unable to palpate the styloid on either side.  But on review of the CT scan this does not appear excessive. Neck: No palpable adenopathy or masses.  The area of discomfort is just behind the submandibular gland on the right side but there is no palpable adenopathy or masses in the semitubular gland appears normal to palpation with clear drainage from the submandibular duct. Respiratory: Breathing comfortably  Skin: No facial/neck lesions or rash noted.  Nasal/sinus endoscopy  Date/Time: 09/03/2020 5:50 PM Performed by: Drema Halon, MD Authorized by: Drema Halon, MD   Consent:    Consent obtained:  Verbal   Consent given by:  Patient Procedure details:    Indications: sino-nasal symptoms     Medication:  Afrin   Instrument: flexible fiberoptic nasal endoscope     Scope location: bilateral nare   Septum:    normal   Sinus:    Right middle meatus: normal     Left middle meatus: normal     Right nasopharynx: normal     Left nasopharynx: normal     Right Eustachian tube orifices: normal     Left Eustachian tube orifices: normal   Comments:     On nasal endoscopy  there is no evidence of infection.  The nasopharynx was clear as was the eustachian tube openings bilaterally.  He did have mild rhinitis with clear mucus discharge.    Assessment: Essentially normal head neck evaluation on clinical exam and nasal endoscopy. He has some symptoms of rhinitis and possible eustachian tube dysfunction.  Plan: Suggested use of Nasacort 2 sprays each nostril at night to see if this helps at all with the abnormal symptom in the back of the nose as well as the popping in the ears. Reassured him of normal examination otherwise. Concerning the headaches and neck  pain would recommend treatment by the neurologist.  Narda Bonds, MD

## 2020-09-07 ENCOUNTER — Other Ambulatory Visit: Payer: Self-pay

## 2020-09-07 ENCOUNTER — Emergency Department (HOSPITAL_COMMUNITY)
Admission: EM | Admit: 2020-09-07 | Discharge: 2020-09-07 | Disposition: A | Discharge status: Home / Self Care | Payer: 59 | Attending: Emergency Medicine | Admitting: Emergency Medicine

## 2020-09-07 ENCOUNTER — Encounter (HOSPITAL_COMMUNITY): Payer: Self-pay

## 2020-09-07 DIAGNOSIS — R52 Pain, unspecified: Secondary | ICD-10-CM

## 2020-09-07 DIAGNOSIS — M542 Cervicalgia: Secondary | ICD-10-CM | POA: Insufficient documentation

## 2020-09-07 DIAGNOSIS — R519 Headache, unspecified: Secondary | ICD-10-CM | POA: Diagnosis present

## 2020-09-07 DIAGNOSIS — R6889 Other general symptoms and signs: Secondary | ICD-10-CM

## 2020-09-07 DIAGNOSIS — M25552 Pain in left hip: Secondary | ICD-10-CM | POA: Insufficient documentation

## 2020-09-07 MED ORDER — CELECOXIB 200 MG PO CAPS
200.0000 mg | ORAL_CAPSULE | Freq: Two times a day (BID) | ORAL | 0 refills | Status: DC
Start: 2020-09-07 — End: 2020-09-15

## 2020-09-07 MED ORDER — KETOROLAC TROMETHAMINE 60 MG/2ML IM SOLN
30.0000 mg | Freq: Once | INTRAMUSCULAR | Status: DC
  Filled 2020-09-07: qty 2

## 2020-09-07 NOTE — ED Triage Notes (Signed)
Pt complains of hip and leg pain since April, pt has multiple meds from his primary doctor but he wants an xray, he has an appt with his orthopedic tomorrow Pt also complains of a headache

## 2020-09-07 NOTE — ED Provider Notes (Signed)
[facility]-EMERGENCY DEPT Provider Note   CSN: [id number] Arrival date & time: 09/07/20  2013     History No chief complaint on file.   Nicholas Johnson. is a 29 y.o. male who presents with multiple complaints. He is VERY difficult to follow. He states that years ago he felt a pop in his neck and has pain when he talks. He has an associated headache. He has seen headache specialists and an ENT and "no one can tell me what is wrong." He has had previous CT scan and an MRI which were negative. In April he fell at work. He is now using a walker. He complains of pain in his left hip.  He has been seen at [facility] but states "they did not do anything for me."  He is now following up with [facility] tomorrow.  He states that he needs an MRI of his ankle tonight.  He denies any changes in his pain.  He is taking multiple medications including gabapentin, anti-inflammatory medications, methocarbamol.  He denies unilateral weakness, paresthesia or visual changes.  HPI     Past Medical History:  Diagnosis Date  . Nasal congestion   . Parotid gland pain   . Psychosis [facility])    admitted 12/15    Patient Active Problem List   Diagnosis Date Noted  . 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 Altered mental status   . Psychoses (HCC)   . Parotid gland pain   . Nasal congestion     Past Surgical History:  Procedure Laterality Date  . WISDOM TOOTH EXTRACTION     25        Family History  Problem Relation Age of Onset  . Colon cancer Neg Hx   . Esophageal cancer Neg Hx   . Pancreatic cancer Neg Hx   . Stomach cancer Neg Hx   . Liver disease Neg Hx     Social History   Tobacco Use  . Smoking status: Never Smoker  . Smokeless tobacco: Never Used  Vaping Use  . Vaping Use: Never used  Substance Use Topics  . Alcohol use: No  . Drug use: Not Currently    Types: Marijuana    Comment: several years ago as of 01/07/20    Home Medications Prior to Admission  medications   Medication Sig Start Date End Date Taking? Authorizing Provider  diclofenac Sodium (VOLTAREN) 1 % GEL Apply 2 g topically 4 (four) times daily. 02/27/20   Muthersbaugh, Dahlia Client, PA-C  gabapentin (NEURONTIN) 100 MG capsule Take 600 mg nightly and 300 mg every morning 07/02/20   Evon Slack, PA-C  HYDROcodone-acetaminophen (NORCO/VICODIN) 5-325 MG tablet Take 1 tablet by mouth every 6 (six) hours as needed for severe pain. 03/12/20   Comfort, Velna Hatchet, MD  methocarbamol (ROBAXIN) 500 MG tablet Take 1 tablet (500 mg total) by mouth 2 (two) times daily. 03/01/20   Mannie Stabile, PA-C  naproxen (NAPROSYN) 500 MG tablet Take 1 tablet (500 mg total) by mouth 2 (two) times daily. 03/01/20   Mannie Stabile, PA-C  predniSONE (DELTASONE) 10 MG tablet Take 1 tablet (10 mg total) by mouth daily. 6,5,4,3,2,1 six day taper 05/04/20   Evon Slack, PA-C    Allergies    Patient has no known allergies.  Review of Systems   Review of Systems Ten systems reviewed and are negative for acute change, except as noted in the HPI.   Physical Exam Updated Vital Signs BP  126/83   Pulse 84   Temp 97.9 F (36.6 C) (Axillary)   Resp 18   SpO2 100%   Physical Exam Vitals and nursing note reviewed.  Constitutional:      General: He is not in acute distress.    Appearance: He is well-developed. He is not diaphoretic.  HENT:     Head: Normocephalic and atraumatic.  Eyes:     General: No scleral icterus.    Conjunctiva/sclera: Conjunctivae normal.     Pupils: Pupils are equal, round, and reactive to light.     Comments: No horizontal, vertical or rotational nystagmus  Neck:     Comments: Full active and passive ROM without pain No midline or paraspinal tenderness No nuchal rigidity or meningeal signs Cardiovascular:     Rate and Rhythm: Normal rate and regular rhythm.  Pulmonary:     Effort: Pulmonary effort is normal. No respiratory distress.     Breath sounds: Normal breath  sounds. No wheezing or rales.  Abdominal:     General: Bowel sounds are normal.     Palpations: Abdomen is soft.     Tenderness: There is no abdominal tenderness. There is no guarding or rebound.  Musculoskeletal:        General: Tenderness and signs of injury present. No swelling or deformity. Normal range of motion.     Cervical back: Normal range of motion and neck supple.     Right lower leg: No edema.  Lymphadenopathy:     Cervical: No cervical adenopathy.  Skin:    General: Skin is warm and dry.     Findings: No rash.  Neurological:     Mental Status: He is alert and oriented to person, place, and time.     Cranial Nerves: No cranial nerve deficit.     Motor: No abnormal muscle tone.     Coordination: Coordination normal.     Comments: Mental Status:  Alert, oriented, thought content appropriate. Speech fluent without evidence of aphasia. Able to follow 2 step commands without difficulty.  Cranial Nerves:  II:  Peripheral visual fields grossly normal, pupils equal, round, reactive to light III,IV, VI: ptosis not present, extra-ocular motions intact bilaterally  V,VII: smile symmetric, facial light touch sensation equal VIII: hearing grossly normal bilaterally  IX,X: midline uvula rise  XI: bilateral shoulder shrug equal and strong XII: midline tongue extension  Motor:  5/5 in upper and lower extremities bilaterally including strong and equal grip strength and dorsiflexion/plantar flexion Sensory: Pinprick and light touch normal in all extremities.  Cerebellar: normal finger-to-nose with bilateral upper extremities Gait: normal gait and balance CV: distal pulses palpable throughout   Psychiatric:        Behavior: Behavior normal.        Thought Content: Thought content normal.        Judgment: Judgment normal.     ED Results / Procedures / Treatments   Labs (all labs ordered are listed, but only abnormal results are displayed) Labs Reviewed - No data to  display  EKG None  Radiology No results found.  Procedures Procedures (including critical care time)  Medications Ordered in ED Medications - No data to display  ED Course  I have reviewed the triage vital signs and the nursing notes.  Pertinent labs & imaging results that were available during my care of the patient were reviewed by me and considered in my medical decision making (see chart for details).    MDM Rules/Calculators/A&P  Patient here with multiple complaints.  He jumps from body to part to body part.  Whenever I ask a question he jumps to another complaint.  He seems to ruminate.  When I discussed with him that he seems to have had extensive work-ups in the outpatient setting with nothing new he has a new complaint that he states needs to be evaluated.  I have very strong concern for somatoform disorder.  Given the fact that he has had extensive work-up in the outpatient setting, no new injuries, he is being treated by multiple specialist I do not think the emergency department can offer him any insight into his ongoing injuries and complaints.  I had a long discussion with the patient about this.  Patient given a shot of Toradol and will change one of his medications to Celebrex.  He may follow-up closely with all of his specialists.  Do not think that he has any emergent cause of his headache, throat pain or leg pain. No red flag sxs.  Final Clinical Impression(s) / ED Diagnoses Final diagnoses:  None    Rx / DC Orders ED Discharge Orders    None       Arthor Captain, PA-C 09/07/20 2104    Pollyann Savoy, MD 09/07/20 2111

## 2020-09-07 NOTE — Progress Notes (Signed)
CSW received a call from pt's RN stating may need assistance with transport.  CSW notes in chart pt is barely ambulatory with a walker and for safety provided transport via [facility].  CN/Triage updated pt was witnessed by the CSW getting into [facility] transport.  Please reconsult if future social work needs arise.  CSW signing off, as social work intervention is no longer needed.  Dorothe Pea. Mandi Mattioli  MSW, LCSW, LCAS, CCS Transitions of Care Clinical Social Worker Care Coordination Department Ph: [phone]

## 2020-09-07 NOTE — Discharge Instructions (Addendum)
Please follow up with ALL of your specialists as soon as possible.

## 2020-09-08 ENCOUNTER — Other Ambulatory Visit: Payer: Self-pay

## 2020-09-08 ENCOUNTER — Emergency Department
Admission: EM | Admit: 2020-09-08 | Discharge: 2020-09-08 | Disposition: A | Discharge status: Home / Self Care | Payer: 59 | Attending: Emergency Medicine | Admitting: Emergency Medicine

## 2020-09-08 ENCOUNTER — Emergency Department: Payer: 59

## 2020-09-08 ENCOUNTER — Telehealth (INDEPENDENT_AMBULATORY_CARE_PROVIDER_SITE_OTHER): Payer: Self-pay

## 2020-09-08 DIAGNOSIS — R519 Headache, unspecified: Secondary | ICD-10-CM | POA: Insufficient documentation

## 2020-09-08 DIAGNOSIS — S199XXA Unspecified injury of neck, initial encounter: Secondary | ICD-10-CM | POA: Diagnosis not present

## 2020-09-08 DIAGNOSIS — Y9269 Other specified industrial and construction area as the place of occurrence of the external cause: Secondary | ICD-10-CM | POA: Insufficient documentation

## 2020-09-08 DIAGNOSIS — M542 Cervicalgia: Secondary | ICD-10-CM

## 2020-09-08 DIAGNOSIS — W228XXA Striking against or struck by other objects, initial encounter: Secondary | ICD-10-CM | POA: Insufficient documentation

## 2020-09-08 MED ORDER — ACETAMINOPHEN 325 MG PO TABS
650.0000 mg | ORAL_TABLET | Freq: Once | ORAL | Status: AC
  Administered 2020-09-08: 650 mg via ORAL
  Filled 2020-09-08: qty 2

## 2020-09-08 MED ORDER — DIAZEPAM 2 MG PO TABS
2.0000 mg | ORAL_TABLET | Freq: Once | ORAL | Status: AC
  Administered 2020-09-08: 2 mg via ORAL
  Filled 2020-09-08: qty 1

## 2020-09-08 MED ORDER — BUTALBITAL-APAP-CAFFEINE 50-325-40 MG PO TABS
1.0000 | ORAL_TABLET | Freq: Four times a day (QID) | ORAL | 0 refills | Status: AC | PRN
Start: 2020-09-08 — End: 2020-09-13

## 2020-09-08 MED ORDER — BUTALBITAL-APAP-CAFFEINE 50-325-40 MG PO TABS
1.0000 | ORAL_TABLET | Freq: Four times a day (QID) | ORAL | 0 refills | Status: DC | PRN
Start: 2020-09-08 — End: 2020-09-08

## 2020-09-08 NOTE — ED Triage Notes (Signed)
Pt here for head and neck pain x3 months. Pt has hx of HA since he hit his head at work. Pt states that the muscles in his neck have gotten tighter and stiffer over time. Pt states that when he touches the top of his head he can feel the pain in different areas of his head. Pt NAD in triage.

## 2020-09-08 NOTE — ED Provider Notes (Signed)
Emergency Department Provider Note  ____________________________________________  Time seen: Approximately 8:45 PM  I have reviewed the triage vital signs and the nursing notes.   HISTORY  Chief Complaint Neck Injury   Historian Patient   HPI Nicholas Johnson. is a 29 y.o. male with a history of psychosis, presents to the emergency department with a new and atypical headache and neck pain.  Patient has been under the care of neurology for headaches and has had a recent MRI of his brain with no acute findings.  Patient has also been seen by otolaryngology for globus sensation and has had a reassuring work-up.  Patient states that he needs a CT of his cervical spine as that is the next imaging modality is neurology team is considering.  He denies chest pain, chest tightness or abdominal pain.  Patient was seen in emergency department at [facility] with similar complaints last night and was discharged without work-up.   Past Medical History:  Diagnosis Date  . Nasal congestion   . Parotid gland pain   . Psychosis (HCC)    admitted 12/15     Immunizations up to date:  Yes.     Past Medical History:  Diagnosis Date  . Nasal congestion   . Parotid gland pain   . Psychosis [facility])    admitted 12/15    Patient Active Problem List   Diagnosis Date Noted  . 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 Altered mental status   . Psychoses (HCC)   . Parotid gland pain   . Nasal congestion     Past Surgical History:  Procedure Laterality Date  . WISDOM TOOTH EXTRACTION     25     Prior to Admission medications   Medication Sig Start Date End Date Taking? Authorizing Provider  butalbital-acetaminophen-caffeine (FIORICET) 50-325-40 MG tablet Take 1-2 tablets by mouth every 6 (six) hours as needed for up to 5 days for headache. 09/08/20 09/13/20  Orvil Feil, PA-C  celecoxib (CELEBREX) 200 MG capsule Take 1 capsule (200 mg total) by mouth 2 (two) times daily. 09/07/20   Arthor Captain, PA-C  diclofenac Sodium (VOLTAREN) 1 % GEL Apply 2 g topically 4 (four) times daily. 02/27/20   Muthersbaugh, Dahlia Client, PA-C  gabapentin (NEURONTIN) 100 MG capsule Take 600 mg nightly and 300 mg every morning 07/02/20   Evon Slack, PA-C  HYDROcodone-acetaminophen (NORCO/VICODIN) 5-325 MG tablet Take 1 tablet by mouth every 6 (six) hours as needed for severe pain. 03/12/20   [place], Velna Hatchet, MD  methocarbamol (ROBAXIN) 500 MG tablet Take 1 tablet (500 mg total) by mouth 2 (two) times daily. 03/01/20   Mannie Stabile, PA-C    Allergies Patient has no known allergies.  Family History  Problem Relation Age of Onset  . Colon cancer Neg Hx   . Esophageal cancer Neg Hx   . Pancreatic cancer Neg Hx   . Stomach cancer Neg Hx   . Liver disease Neg Hx     Social History Social History   Tobacco Use  . Smoking status: Never Smoker  . Smokeless tobacco: Never Used  Vaping Use  . Vaping Use: Never used  Substance Use Topics  . Alcohol use: No  . Drug use: Not Currently    Types: Marijuana    Comment: several years ago as of 01/07/20     Review of Systems  Constitutional: No fever/chills Eyes:  No discharge ENT: No upper respiratory complaints. Respiratory: no cough. No SOB/ use  of accessory muscles to breath Gastrointestinal:   No nausea, no vomiting.  No diarrhea.  No constipation. Musculoskeletal: Patient has neck pain Neuro: Patient has headache.  Skin: Negative for rash, abrasions, lacerations, ecchymosis.   ____________________________________________   PHYSICAL EXAM:  VITAL SIGNS: ED Triage Vitals  Enc Vitals Group     BP 09/08/20 1840 125/78     Pulse Rate 09/08/20 1840 76     Resp 09/08/20 1840 16     Temp 09/08/20 1840 98.9 F (37.2 C)     Temp Source 09/08/20 1840 Oral     SpO2 09/08/20 1840 98 %     Weight 09/08/20 1840 130 lb (59 kg)     Height 09/08/20 1840 5\' 8"  (1.727 m)     Head Circumference --      Peak Flow --      Pain Score  09/08/20 1845 8     Pain Loc --      Pain Edu? --      Excl. in GC? --      Constitutional: Alert and oriented. Well appearing and in no acute distress. Eyes: Conjunctivae are normal. PERRL. EOMI. Head: Atraumatic. ENT:      Nose: No congestion/rhinnorhea.      Mouth/Throat: Mucous membranes are moist.  Neck: No stridor.  FROM.   Cardiovascular: Normal rate, regular rhythm. Normal S1 and S2.  Good peripheral circulation. Respiratory: Normal respiratory effort without tachypnea or retractions. Lungs CTAB. Good air entry to the bases with no decreased or absent breath sounds Gastrointestinal: Bowel sounds x 4 quadrants. Soft and nontender to palpation. No guarding or rigidity. No distention. Musculoskeletal: Full range of motion to all extremities. No obvious deformities noted Neurologic:  Normal for age. No gross focal neurologic deficits are appreciated.  Skin:  Skin is warm, dry and intact. No rash noted. Psychiatric: Mood and affect are normal for age. Speech and behavior are normal.   ____________________________________________   LABS (all labs ordered are listed, but only abnormal results are displayed)  Labs Reviewed - No data to display ____________________________________________  EKG   ____________________________________________  RADIOLOGY 09/10/20, personally viewed and evaluated these images (plain radiographs) as part of my medical decision making, as well as reviewing the written report by the radiologist.    CT Head Wo Contrast  Result Date: 09/08/2020 CLINICAL DATA:  29 year old male with head and neck pain.  Headache. EXAM: CT HEAD WITHOUT CONTRAST TECHNIQUE: Contiguous axial images were obtained from the base of the skull through the vertex without intravenous contrast. COMPARISON:  Head CT 10/20/2014. FINDINGS: Brain: Cerebral volume is stable since 2015, normal with mega cisterna magna ( normal variant). No midline shift, ventriculomegaly, mass  effect, evidence of mass lesion, intracranial hemorrhage or evidence of cortically based acute infarction. Gray-white matter differentiation is within normal limits throughout the brain. Vascular: No suspicious intracranial vascular hyperdensity. Skull: Stable, negative. Sinuses/Orbits: Visualized paranasal sinuses and mastoids are clear. Other: Visualized orbits and scalp soft tissues are within normal limits. IMPRESSION: Stable since 2015 and normal noncontrast head CT. Electronically Signed   By: 2016 M.D.   On: 09/08/2020 22:02    ____________________________________________    PROCEDURES  Procedure(s) performed:     Procedures     Medications  diazepam (VALIUM) tablet 2 mg (2 mg Oral Given 09/08/20 2044)  acetaminophen (TYLENOL) tablet 650 mg (650 mg Oral Given 09/08/20 2042)     ____________________________________________   INITIAL IMPRESSION / ASSESSMENT AND PLAN /  ED COURSE  Pertinent labs & imaging results that were available during my care of the patient were reviewed by me and considered in my medical decision making (see chart for details).      Assessment and Plan: Headache Neck pain 29 y/o male presents to the ED with aytpical headache and neck pain.   Vital signs were reassuring at triage. No neuro deficits were noted on physical exam.  Patient was given Valium in the ED and patient experienced some improvement in his symptoms.   CT Head and CT cervical spine imaging were reassuring.   Patient was discharged with a short course of Fioricet for likely tension type headache and was advised to follow up with his neurology team.    ____________________________________________  FINAL CLINICAL IMPRESSION(S) / ED DIAGNOSES  Final diagnoses:  Neck pain  Acute nonintractable headache, unspecified headache type      NEW MEDICATIONS STARTED DURING THIS VISIT:  ED Discharge Orders         Ordered    butalbital-acetaminophen-caffeine (FIORICET)  50-325-40 MG tablet  Every 6 hours PRN,   Status:  Discontinued        09/08/20 2247    butalbital-acetaminophen-caffeine (FIORICET) 50-325-40 MG tablet  Every 6 hours PRN        09/08/20 2302              This chart was dictated using voice recognition software/Dragon. Despite best efforts to proofread, errors can occur which can change the meaning. Any change was purely unintentional.     Orvil Feil, PA-C 09/08/20 2356    Chesley Noon, MD 09/11/20 1315

## 2020-09-08 NOTE — Discharge Instructions (Addendum)
Take Fioricet every six hours as needed for tension type headaches.

## 2020-09-09 ENCOUNTER — Telehealth (INDEPENDENT_AMBULATORY_CARE_PROVIDER_SITE_OTHER): Payer: Self-pay

## 2020-09-09 NOTE — ED Provider Notes (Signed)
Patient calls back today on the 18th because the prescription never made it to the pharmacy.  I reviewed the record the prescription is in that should have been submitted but [facility] does not have a record of it after I talk to them.  I have called in on the phone and they are filling it now.   Arnaldo Natal, MD 09/09/20 [name]

## 2020-09-10 ENCOUNTER — Telehealth (INDEPENDENT_AMBULATORY_CARE_PROVIDER_SITE_OTHER): Payer: Self-pay | Admitting: Otolaryngology

## 2020-09-10 NOTE — Telephone Encounter (Signed)
Returned a call to Nicholas Johnson today concerning some questions he has had. He has been seen in the ED because of headache and neck pain and had CT scans which were clear. I reviewed the CT scans and there was no evidence of sinus problems. Middle ear space was clear on the scans. 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 I discussed with him that on head neck examination had a normal examination. Would recommend use of nasal steroid spray and saline rinses for nasal congestion. He describes popping and sometimes pressure in the ears and recommended that he obtain audiogram with tympanometry as he had normal clinical exam in the office. We can see if he has any kind of hearing problems that may be contributing to his symptoms.

## 2020-09-13 ENCOUNTER — Telehealth (INDEPENDENT_AMBULATORY_CARE_PROVIDER_SITE_OTHER): Payer: Self-pay

## 2020-09-15 ENCOUNTER — Emergency Department (HOSPITAL_COMMUNITY)
Admission: EM | Admit: 2020-09-15 | Discharge: 2020-09-15 | Disposition: A | Discharge status: Home / Self Care | Payer: 59 | Attending: Emergency Medicine | Admitting: Emergency Medicine

## 2020-09-15 ENCOUNTER — Ambulatory Visit (INDEPENDENT_AMBULATORY_CARE_PROVIDER_SITE_OTHER): Payer: 59 | Admitting: Family Medicine

## 2020-09-15 ENCOUNTER — Encounter: Payer: Self-pay | Admitting: Family Medicine

## 2020-09-15 ENCOUNTER — Other Ambulatory Visit: Payer: Self-pay

## 2020-09-15 DIAGNOSIS — R519 Headache, unspecified: Secondary | ICD-10-CM

## 2020-09-15 DIAGNOSIS — G44229 Chronic tension-type headache, not intractable: Secondary | ICD-10-CM | POA: Diagnosis not present

## 2020-09-15 DIAGNOSIS — G8929 Other chronic pain: Secondary | ICD-10-CM | POA: Diagnosis not present

## 2020-09-15 DIAGNOSIS — M25551 Pain in right hip: Secondary | ICD-10-CM | POA: Diagnosis not present

## 2020-09-15 DIAGNOSIS — M25561 Pain in right knee: Secondary | ICD-10-CM | POA: Diagnosis not present

## 2020-09-15 DIAGNOSIS — M542 Cervicalgia: Secondary | ICD-10-CM | POA: Diagnosis not present

## 2020-09-15 MED ORDER — LIDOCAINE 5 % EX PTCH: 1.0000 | MEDICATED_PATCH | CUTANEOUS | Status: DC

## 2020-09-15 MED ORDER — VITAMIN D-3 125 MCG (5000 UT) PO TABS: 1.0000 | ORAL_TABLET | Freq: Every day | ORAL | 3 refills | Status: DC

## 2020-09-15 MED ORDER — METHOCARBAMOL 500 MG PO TABS
500.0000 mg | ORAL_TABLET | Freq: Two times a day (BID) | ORAL | 0 refills | Status: DC
Start: 2020-09-15 — End: 2020-11-02

## 2020-09-15 MED ORDER — TIZANIDINE HCL 2 MG PO TABS
2.0000 mg | ORAL_TABLET | Freq: Four times a day (QID) | ORAL | 1 refills | Status: DC | PRN
Start: 2020-09-15 — End: 2020-11-02

## 2020-09-15 NOTE — ED Notes (Signed)
registration came out of pts room and told NT that pt stated, "I am unsatisfied with my care. I feel like they haven't done anything to help me besides give me medicine and discharge me". Pt annoyed that he wasn't taken for any scans. RN notified.

## 2020-09-15 NOTE — ED Triage Notes (Signed)
Patient c/o multiple pain sites from multiple injuries that he acquired over the last few months since April.

## 2020-09-15 NOTE — Discharge Instructions (Signed)
Please trial out Robaxin as a muscle relaxer for your pain.  Please keep your appointments with your doctors.  Please continue to use the Neurontin/gabapentin that you are prescribed.  This muscle relaxer which is similar to baclofen and Zanaflex can make you feel drowsy.  Please monitor your symptoms.   Warm compresses Tylenol ibuprofen can be very helpful.  Please use Tylenol or ibuprofen for pain.  You may use 600 mg ibuprofen every 6 hours or 1000 mg of Tylenol every 6 hours.  You may choose to alternate between the 2.  This would be most effective.  Not to exceed 4 g of Tylenol within 24 hours.  Not to exceed 3200 mg ibuprofen 24 hours.  I have also prescribed you lidocaine patches.
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
## 2020-09-15 NOTE — ED Notes (Signed)
Patient verbalizes understanding of discharge instructions. Opportunity for questioning and answers were provided. Pt discharged from ED stable & ambulatory.   

## 2020-09-18 ENCOUNTER — Other Ambulatory Visit: Payer: Self-pay

## 2020-09-18 ENCOUNTER — Ambulatory Visit (HOSPITAL_COMMUNITY)
Admission: EM | Admit: 2020-09-18 | Discharge: 2020-09-18 | Disposition: A | Discharge status: Home / Self Care | Payer: 59 | Attending: Physician Assistant | Admitting: Physician Assistant

## 2020-09-18 ENCOUNTER — Encounter (HOSPITAL_COMMUNITY): Payer: Self-pay

## 2020-09-18 DIAGNOSIS — M542 Cervicalgia: Secondary | ICD-10-CM

## 2020-09-18 DIAGNOSIS — R519 Headache, unspecified: Secondary | ICD-10-CM

## 2020-09-18 DIAGNOSIS — G8929 Other chronic pain: Secondary | ICD-10-CM

## 2020-09-18 MED ORDER — DICLOFENAC SODIUM 75 MG PO TBEC: 75.0000 mg | DELAYED_RELEASE_TABLET | Freq: Two times a day (BID) | ORAL | 0 refills | Status: DC

## 2020-09-18 NOTE — Discharge Instructions (Signed)
NECK PAIN: Stressed avoiding painful activities. This can exacerbate your symptoms and make them worse.  May apply heat to the areas of pain for some relief. Use medications as directed. Be aware of which medications make you drowsy and do not drive or operate any kind of heavy machinery while using the medication (ie pain medications or muscle relaxers). F/U with PCP for reexamination or return sooner if condition worsens or does not begin to improve over the next few days.   NECK PAIN RED FLAGS: If symptoms get worse than they are right now, you should come back sooner for re-evaluation. If you have increased numbness/ tingling or notice that the numbness/tingling is affecting the legs or saddle region, go to ER. If you ever lose continence go to ER.     
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
## 2020-09-22 ENCOUNTER — Ambulatory Visit (INDEPENDENT_AMBULATORY_CARE_PROVIDER_SITE_OTHER): Payer: 59 | Admitting: Physician Assistant

## 2020-09-22 ENCOUNTER — Encounter: Payer: Self-pay | Admitting: Physician Assistant

## 2020-09-22 ENCOUNTER — Telehealth: Payer: Self-pay | Admitting: Family Medicine

## 2020-09-22 VITALS — BP 118/78 | HR 84 | Ht 67.0 in | Wt 128.2 lb

## 2020-09-22 DIAGNOSIS — K219 Gastro-esophageal reflux disease without esophagitis: Secondary | ICD-10-CM

## 2020-09-22 DIAGNOSIS — R110 Nausea: Secondary | ICD-10-CM | POA: Diagnosis not present

## 2020-09-22 DIAGNOSIS — R6881 Early satiety: Secondary | ICD-10-CM | POA: Diagnosis not present

## 2020-09-22 MED ORDER — OMEPRAZOLE 20 MG PO CPDR: 20.0000 mg | DELAYED_RELEASE_CAPSULE | Freq: Every day | ORAL | 2 refills | Status: DC

## 2020-09-22 NOTE — Progress Notes (Signed)
Reviewed and agree with management plan.  Aristides Luckey T. Prentis Langdon, MD FACG [facility]  

## 2020-09-22 NOTE — Telephone Encounter (Signed)
Patient called stating that he has been having ongoing neck and head pain. He said that a couple years ago he had a head injury where he is his head very hard but he was not diagnosed with a concussion. He recently hit his head again (but he did not think it was very hard) and has been having a lot of pain in his head and neck. He said that the pain is in a band around his head and when he touches his head and can pinpoint the exact spots. He had an MRI and CT scan in the past and they seemed fine. His PCP has re-ordered another MRI so they are in the process of getting that scheduled.  Is this something that we can help him with?  Please advise.  Call back# [phone]

## 2020-09-22 NOTE — Patient Instructions (Signed)
If you are age 28 or older, your body mass index should be between 23-30. Your Body mass index is 20.09 kg/m. If this is out of the aforementioned range listed, please consider follow up with your Primary Care Provider.  If you are age 25 or younger, your body mass index should be between 19-25. Your Body mass index is 20.09 kg/m. If this is out of the aformentioned range listed, please consider follow up with your Primary Care Provider.   We have sent the following medications to your pharmacy for you to pick up at your convenience: Omeprazole 20 mg once daily before breakfast.  Thank you for choosing me and [facility].  Hyacinth Meeker, PA-C

## 2020-09-22 NOTE — Progress Notes (Signed)
Chief Complaint: Nausea  HPI:    Nicholas Johnson is a 29 year old African-American male, known to Dr. Russella Dar, with a past medical history as listed below, who presents to clinic today for complaint of nausea.    02/10/2020 office visit with Willette Cluster, NP for chronic generalized abdominal pain and constipation.  He had had a normal CT scan of the abdomen pelvis with contrast, normal CMP, lipase and CBC.  Overall his weight was stable, fluctuated.  He was worried about PUD.  He was thought pain was possibly musculoskeletal and constipation could be contributing.  Was thought anxiety was a contributing factor.  He inquired about an MRI but there was no indication for this.  He agreed to take Metamucil twice daily and 2 Colace daily.  He was given a 5-day trial of Robaxin to take at night.  It was discussed that if he had no improvement with the pain with muscle ask her resolution of constipation then proceed with EGD, though explained that would be low yield.    Today, the patient presents to clinic and tells me that he has had a rough 6 months with a labral tear in his hip etc. on crutches.  Now developed neck pain and is seeing an ENT/neurologist in regards to this.  Apparently they have an MRI ordered for his neck for follow-up.   bbbTells me that he has been taking some pills for this and tells me that when he swallows these pills he feels weird.  Tells me that his stomach feels very "full up", also describes nausea and tells me that his eating habits are "terrible", apparently does not like to eat a full meal because of the symptoms that it reproduces.  Tells me the ENT told him he may have reflux issues.    Denies fever, chills or change in bowel habits.  Past Medical History:  Diagnosis Date  . Nasal congestion   . Parotid gland pain   . Psychosis (HCC)    admitted 12/15    Past Surgical History:  Procedure Laterality Date  . WISDOM TOOTH EXTRACTION     25     Current Outpatient  Medications  Medication Sig Dispense Refill  . Cholecalciferol (VITAMIN D-3) 125 MCG (5000 UT) TABS Take 1 tablet by mouth daily. 90 tablet 3  . Cholecalciferol 1.25 MG (50000 UT) capsule cholecalciferol (vitamin D3) 1,250 mcg (50,000 unit) capsule    . diclofenac (VOLTAREN) 75 MG EC tablet Take 1 tablet (75 mg total) by mouth 2 (two) times daily. 60 tablet 0  . gabapentin (NEURONTIN) 300 MG capsule Take by mouth.    . methocarbamol (ROBAXIN) 500 MG tablet Take 1-2 tablets (500-1,000 mg total) by mouth 2 (two) times daily. 30 tablet 0  . rizatriptan (MAXALT) 10 MG tablet Take 10 mg by mouth as directed.    Marland Kitchen tiZANidine (ZANAFLEX) 2 MG tablet Take 1-2 tablets (2-4 mg total) by mouth every 6 (six) hours as needed for muscle spasms. 60 tablet 1  . topiramate (TOPAMAX) 25 MG tablet Take by mouth.     No current facility-administered medications for this visit.    Allergies as of 09/22/2020  . (No Known Allergies)    Family History  Problem Relation Age of Onset  . Colon cancer Neg Hx   . Esophageal cancer Neg Hx   . Pancreatic cancer Neg Hx   . Stomach cancer Neg Hx   . Liver disease Neg Hx     Social History  Socioeconomic History  . Marital status: Single    Spouse name: Not on file  . Number of children: Not on file  . Years of education: Not on file  . Highest education level: Not on file  Occupational History  . Not on file  Tobacco Use  . Smoking status: Never Smoker  . Smokeless tobacco: Never Used  Vaping Use  . Vaping Use: Never used  Substance and Sexual Activity  . Alcohol use: No  . Drug use: Not Currently    Types: Marijuana    Comment: several years ago as of 01/07/20  . Sexual activity: Yes  Other Topics Concern  . Not on file  Social History Narrative  . Not on file   Social Determinants of Health   Financial Resource Strain:   . Difficulty of Paying Living Expenses: Not on file  Food Insecurity:   . Worried About Programme researcher, broadcasting/film/video in the Last  Year: Not on file  . Ran Out of Food in the Last Year: Not on file  Transportation Needs:   . Lack of Transportation (Medical): Not on file  . Lack of Transportation (Non-Medical): Not on file  Physical Activity:   . Days of Exercise per Week: Not on file  . Minutes of Exercise per Session: Not on file  Stress:   . Feeling of Stress : Not on file  Social Connections:   . Frequency of Communication with Friends and Family: Not on file  . Frequency of Social Gatherings with Friends and Family: Not on file  . Attends Religious Services: Not on file  . Active Member of Clubs or Organizations: Not on file  . Attends Banker Meetings: Not on file  . Marital Status: Not on file  Intimate Partner Violence:   . Fear of Current or Ex-Partner: Not on file  . Emotionally Abused: Not on file  . Physically Abused: Not on file  . Sexually Abused: Not on file    Review of Systems:    Constitutional: No weight loss, fever or chills Cardiovascular: No chest pain Respiratory: No SOB Gastrointestinal: See HPI and otherwise negative   Physical Exam:  Vital signs: BP 118/78 (BP Location: Left Arm, Patient Position: Sitting, Cuff Size: Normal)   Pulse 84   Ht 5\' 7"  (1.702 m) Comment: height measured without shoes  Wt 128 lb 4 oz (58.2 kg)   BMI 20.09 kg/m   Constitutional:   Pleasant AA male appears to be in NAD, Well developed, Well nourished, alert and cooperative Respiratory: Respirations even and unlabored. Lungs clear to auscultation bilaterally.   No wheezes, crackles, or rhonchi.  Cardiovascular: Normal S1, S2. No MRG. Regular rate and rhythm. No peripheral edema, cyanosis or pallor.  Gastrointestinal:  Soft, nondistended, nontender. No rebound or guarding. Normal bowel sounds. No appreciable masses or hepatomegaly. Rectal:  Not performed.  Psychiatric: Anxious Demonstrates good judgement and reason without abnormal affect or behaviors.  No recent labs or  imaging.  Assessment: 1.  Early satiety: Gastritis versus functional dyspepsia 2.  GERD: Occasional symptoms, could be related to other symptoms 3.  Nausea: Consider relation to gastritis+/-polypharmacy  Plan: 1.  Again, discussed that a lot of his symptoms are likely related to anxiety.  Explained that the only way to fully evaluate his stomach is to do an endoscopy.  He has a fear of something "going down my throat".  Explained that he will be asleep for this and it is "the same tube that  the food goes down".  Patient tells me that he still wants to think about this procedure.  Discussed that I think this would help with a lot of his anxiety and fears to at least let us know what is happening or what is not happening in his stomach.  Tells me he will let me know if he changes his mind. 2.  Started the patient on Omeprazole 20 mg daily, 30-60 minutes for breakfast #30 with 1 refill. 3.  Patient to follow in clinic with Willette Cluster in 2 to 3 months.  He had requested to see her.  Hyacinth Meeker, PA-C [facility] 09/22/2020, 9:14 AM  Cc: No ref. provider found

## 2020-09-23 NOTE — Telephone Encounter (Signed)
Spoke with patient last night for 25 minutes at end of day. Patient has been having neck and head pain for the past 3 months. Patient has to loosen hitches at work and did slip on truck bed a few months ago injuring his knee. Does not state that he suffered any head injury at that time of the slip. Patient has been having pain in neck that radiates up into skull which is burning at times. Patient has tried chiropractic care which did not help his pain but made it worse. Has had CT scans and seen neurology at some point. States that his PCP wants him to have MRI but no one has contacted him. Patient is worried as pain seems to be worsening recently. Recommend that patient be seen in ED if pain is unmanageable. Patient agrees and was going to head to [employer]. Otherwise, recommended that patient follow-up with neurology for on-going neck and head pain that appears to be chronic.

## 2020-09-23 NOTE — Telephone Encounter (Signed)
Pt called again, considering visit to ED. Reminded pt of our recommendation from yesterday that ED is appropriate if pain is unmanageable. Patient was calling from his PCP office, waiting for an appt. Advised him to discuss his concerns with his PCP and to consider a referral to neurology.

## 2020-09-25 ENCOUNTER — Emergency Department
Admission: EM | Admit: 2020-09-25 | Discharge: 2020-09-25 | Disposition: A | Discharge status: Home / Self Care | Payer: 59 | Attending: Emergency Medicine | Admitting: Emergency Medicine

## 2020-09-25 ENCOUNTER — Encounter: Payer: Self-pay | Admitting: Emergency Medicine

## 2020-09-25 ENCOUNTER — Other Ambulatory Visit: Payer: Self-pay

## 2020-09-25 DIAGNOSIS — G8929 Other chronic pain: Secondary | ICD-10-CM | POA: Diagnosis not present

## 2020-09-25 DIAGNOSIS — R519 Headache, unspecified: Secondary | ICD-10-CM | POA: Diagnosis not present

## 2020-09-25 DIAGNOSIS — M542 Cervicalgia: Secondary | ICD-10-CM

## 2020-09-25 MED ORDER — KETOROLAC TROMETHAMINE 30 MG/ML IJ SOLN
INTRAMUSCULAR | Status: AC
  Filled 2020-09-25: qty 1

## 2020-09-25 MED ORDER — KETOROLAC TROMETHAMINE 10 MG PO TABS
30.0000 mg | ORAL_TABLET | Freq: Once | ORAL | Status: AC
  Administered 2020-09-25: 30 mg via ORAL
  Filled 2020-09-25: qty 3

## 2020-09-25 MED ORDER — KETOROLAC TROMETHAMINE 30 MG/ML IJ SOLN: 30.0000 mg | Freq: Once | INTRAMUSCULAR | Status: DC

## 2020-09-25 NOTE — ED Provider Notes (Addendum)
[facility] Emergency Department Provider Note ____________________________________________  Time seen: 1900  I have reviewed the triage vital signs and the nursing notes.  HISTORY  Chief Complaint  Headache and Neck Pain   HPI Nicholas Johnson. is a 29 y.o. male presents to the clinic today with complaint of headache and neck pain.  He reports this is a chronic issue for the last 2 years that seems to be worsening in the last few months.  The headache is located in his left posterior head.  He describes the pain as constant achy pain but can be sharp and burning at times.  The pain radiates down his neck.  Laying down and getting up from a lying to a standing position seems to make his pain worse.  He reports some intermittent dizziness in the past but none recently.  He denies visual changes.  He describes the neck pain as achy and sharp, worse with certain movements.  He has seen a chiropractor in the past 1 month ago for the same and feels like his pain is worse after that time.  He denies numbness, tingling or weakness of his upper extremities.  He has tried Topamax, Maxalt, baclofen, Celebrex, Valium, Fioricet, Zanaflex, Robaxin, diclofenac for the same with minimal relief of symptoms.  He has seen Dr. Prince Rome and Dr. Laurian Brim in the past for the same.  He reports his PCP also recently ordered a MRI of the cervical spine but is needing more documentation for insurance authorization.He denies new symptoms, just persistent symptoms and does not understand why it is taking so long to get imaging done and for someone to find out what was wrong with him.  Past Medical History:  Diagnosis Date  . Nasal congestion   . Parotid gland pain   . Psychosis [facility])    admitted 12/15    Patient Active Problem List   Diagnosis Date Noted  . Right leg weakness 07/20/2020  . Injury of left leg 07/09/2020  . Low back pain 06/10/2020  . Pain in joint of right hip 06/10/2020  .  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 Altered mental status   . Psychoses (HCC)   . Parotid gland pain   . Nasal congestion     Past Surgical History:  Procedure Laterality Date  . WISDOM TOOTH EXTRACTION     25     Prior to Admission medications   Medication Sig Start Date End Date Taking? Authorizing Provider  butalbital-acetaminophen-caffeine (FIORICET) 50-325-40 MG tablet Take 1 tablet by mouth as needed. 09/19/20   [provider]  carisoprodol (SOMA) 350 MG tablet Take 1 tablet by mouth in the morning, at noon, and at bedtime. 09/20/20   [provider]  Cholecalciferol (VITAMIN D-3) 125 MCG (5000 UT) TABS Take 1 tablet by mouth daily. Patient not taking: Reported on 09/22/2020 09/15/20   Hilts, Casimiro Needle, MD  Cholecalciferol 1.25 MG (50000 UT) capsule cholecalciferol (vitamin D3) 1,250 mcg (50,000 unit) capsule    [provider]  gabapentin (NEURONTIN) 300 MG capsule Take by mouth. 06/17/20   [provider]  METAMUCIL FIBER PO Take 1 Dose by mouth as needed.    [provider]  methocarbamol (ROBAXIN) 500 MG tablet Take 1-2 tablets (500-1,000 mg total) by mouth 2 (two) times daily. 09/15/20   Gailen Shelter, PA  naproxen (NAPROSYN) 500 MG tablet Take 500 mg by mouth 2 (two) times daily. 09/17/20   [provider]  omeprazole (PRILOSEC) 20 MG  capsule Take 1 capsule (20 mg total) by mouth daily. 09/22/20   Unk Lightning, PA  polyethylene glycol powder (GLYCOLAX/MIRALAX) 17 GM/SCOOP powder Take 17 g by mouth as needed.    [provider]  rizatriptan (MAXALT) 10 MG tablet Take 10 mg by mouth as directed. Patient not taking: Reported on 09/22/2020 09/06/20   [provider]  tiZANidine (ZANAFLEX) 2 MG tablet Take 1-2 tablets (2-4 mg total) by mouth every 6 (six) hours as needed for muscle spasms. 09/15/20   Hilts, Casimiro Needle, MD  topiramate (TOPAMAX) 25 MG tablet Take by mouth. 09/06/20   [provider]   Vitamin D, Ergocalciferol, (DRISDOL) 1.25 MG (50000 UNIT) CAPS capsule Take 50,000 Units by mouth once a week. 09/18/20   [provider]    Allergies Patient has no known allergies.  Family History  Problem Relation Age of Onset  . Colon cancer Neg Hx   . Esophageal cancer Neg Hx   . Pancreatic cancer Neg Hx   . Stomach cancer Neg Hx   . Liver disease Neg Hx     Social History Social History   Tobacco Use  . Smoking status: Never Smoker  . Smokeless tobacco: Never Used  Vaping Use  . Vaping Use: Never used  Substance Use Topics  . Alcohol use: No  . Drug use: Not Currently    Types: Marijuana    Comment: several years ago as of 01/07/20    Review of Systems  Constitutional: Negative for fever, chills or body aches. Eyes: Negative for visual changes. Cardiovascular: Negative for chest pain or chest tightness. Respiratory: Negative for cough or shortness of breath. Musculoskeletal: Positive for neck pain. Denies pain in shoulders.  Skin: Negative for rash. Neurological: Positive for headaches, intermittent dizziness. Negative for focal weakness, tingling or numbness. ____________________________________________  PHYSICAL EXAM:  VITAL SIGNS: ED Triage Vitals  Enc Vitals Group     BP 09/25/20 1810 133/74     Pulse Rate 09/25/20 1810 75     Resp 09/25/20 1810 18     Temp 09/25/20 1810 98.9 F (37.2 C)     Temp Source 09/25/20 1810 Oral     SpO2 09/25/20 1810 99 %     Weight 09/25/20 1806 128 lb 4 oz (58.2 kg)     Height 09/25/20 1806 5\' 7"  (1.702 m)     Head Circumference --      Peak Flow --      Pain Score 09/25/20 1805 9     Pain Loc --      Pain Edu? --      Excl. in GC? --     Constitutional: Alert and oriented. Well appearing and in no distress. Head: Normocephalic and atraumatic. Eyes: Conjunctivae are normal. PERRL. Normal extraocular movements Cardiovascular: Normal rate, regular rhythm.  Respiratory: Normal respiratory effort. No  wheezes/rales/rhonchi. Musculoskeletal: Normal flexion, extension and rotation of the cervical spine. Bony tenderness noted of the lower cervical, upper thoracic spine. Strength 5/5 BUE. Hand grips equal.   Neurologic:  Normal gait without ataxia. Normal speech and language. No gross focal neurologic deficits are appreciated. Skin:  Skin is warm, dry and intact. No rash noted.  INITIAL IMPRESSION / ASSESSMENT AND PLAN / ED COURSE  Chronic Neck Pain, Frequent Headaches:  Toradol 30 mg IM x 1 He would like MRI brain and cervical spine- advised him without focal neurological deficits, this is not something that could be done in the ER setting His PCP is in  the process of ordering cervical MRI- needing more documentation for insurance authorization Advised him to ask his PCP to obtain brain MRI as well He would like a second opinion from neurosurgery- will have him follow up with Dr. Marcell Barlow He will continue to follow with Dr. Malvin Johns for headache management Continue current meds at this time   ____________________________________________  FINAL CLINICAL IMPRESSION(S) / ED DIAGNOSES  Final diagnoses:  Chronic neck pain  Frequent headaches      Lorre Munroe, NP 09/25/20 1941    Lorre Munroe, NP 09/25/20 1947    Phineas Semen, MD 09/25/20 1956

## 2020-09-25 NOTE — ED Notes (Signed)
Pt states about 6 months ago he slipped at work and injured R leg. Began having pain in R hip and states "pain in his nerves." now c/o pain in posterior neck. States "hurts to talk" states chiropractic and massage therapy made pain worse. Ambulatory in room. A&O.

## 2020-09-25 NOTE — ED Triage Notes (Addendum)
Pt arrived via POV with reports of neck pain and headache for several months, pt has been seen by multiple doctors over the past several months including neuro, PCP and sports medicine.  Pt taking muscle relaxers and was taking tramadol and tylenol and naproxen and prednisone.  Pt also states he was recently prescribed hydrocodone  Pt seen at [facility] on 11/27, states they didn't do anything for him except try to prescribe more medication.

## 2020-09-25 NOTE — Discharge Instructions (Addendum)
You were seen today for chronic neck pain and frequent headaches.  I reviewed your CT scan from 11/17 which showed mild bulging disc at C5-C6 and C6-C7.  This could be the cause of your neck pain and your headaches.  I do recommend you get a updated MRI of your cervical spine and brain- it sounds like your PCP is working on this. Please follow up with Dr. Marcell Barlow.

## 2020-09-25 NOTE — ED Notes (Addendum)
Pt states per MRI 9/21 there was some connective tissue damage to R posterior hip, a "tear" that might possibly require surgery. Pt states that had CT of neck and MRI of head 9/21. Pain in neck, head and R leg all started after fall injury in 4/21. States it hurts even to talk. Has been out of work for 6 months.

## 2020-09-27 ENCOUNTER — Telehealth: Payer: Self-pay

## 2020-09-27 NOTE — Telephone Encounter (Signed)
Patient called and ask to receive call back from the office manager. I called patient today and spoke with him for about 20 minutes regarding his referral to pur office. Dr. Allena Katz declined referral because patient already has 2 neurologist [facility].  Dr. Allena Katz recommended patient follow-up with one of the neurologist that he is already established with. Patient states that he has a nerve conduction study already scheduled as well as an MRI ordered for his head and neck. He is waiting on his insurance to approve the MRI. Patient has also seen a chiropractor and been to the ED where an orthopaedic referral was made. Patient states he received an injection in his hip from orthopaedics with no relief. Patient verbalized understanding and agreed to continue his care with the pain clinic and his neurologist.

## 2020-09-29 NOTE — Telephone Encounter (Signed)
Made in error

## 2020-09-30 ENCOUNTER — Telehealth: Payer: Self-pay | Admitting: *Deleted

## 2020-09-30 ENCOUNTER — Ambulatory Visit: Payer: Self-pay | Admitting: *Deleted

## 2020-09-30 NOTE — Telephone Encounter (Signed)
C/o lightheadedness and reports his vision has been affected but does not state double vision or blurred vision. Reports feeling woozy with movement after taking extra medication of gabapentin 900 mg not as directed and 2 tabs fioricet at the same time. Patient reports his pain in back of head and neck is so bad he had to take extra medication. Denies chest pain , difficulty breathing, SOB. Reports feeling lightheadedness when walking. Instructed patient to take medication only as directed and to go to ED. Patient refused to go to ED and wants a nurse available to assist with care. Instructed patient to contact PCP for pain management if he will not go to ED.  Patient required extended conversation and emotional support. Care advise given and reviewed multiple times. Patient verbalized understanding of care advise but reports he does not know what will work for him . Instructed patient to go to ED or call 911 if symptoms worsen.   Reason for Disposition . Taking a medicine that could cause dizziness (e.g., blood pressure medications, diuretics)  Answer Assessment - Initial Assessment Questions 1. DESCRIPTION: "Describe your dizziness."     Lightheaded, and pain in neck  2. LIGHTHEADED: "Do you feel lightheaded?" (e.g., somewhat faint, woozy, weak upon standing)     Yes, faint woozy  3. VERTIGO: "Do you feel like either you or the room is spinning or tilting?" (i.e. vertigo)     No  4. SEVERITY: "How bad is it?"  "Do you feel like you are going to faint?" "Can you stand and walk?"   - MILD: Feels slightly dizzy, but walking normally.   - MODERATE: Feels very unsteady when walking, but not falling; interferes with normal activities (e.g., school, work) .   - SEVERE: Unable to walk without falling, or requires assistance to walk without falling; feels like passing out now.      Moderate  5. ONSET:  "When did the dizziness begin?"     This pm  6. AGGRAVATING FACTORS: "Does anything make it worse?"  (e.g., standing, change in head position)     movement 7. HEART RATE: "Can you tell me your heart rate?" "How many beats in 15 seconds?"  (Note: not all patients can do this)       No  8. CAUSE: "What do you think is causing the dizziness?"     My head and neck pain  9. RECURRENT SYMPTOM: "Have you had dizziness before?" If Yes, ask: "When was the last time?" "What happened that time?"     No  10. OTHER SYMPTOMS: "Do you have any other symptoms?" (e.g., fever, chest pain, vomiting, diarrhea, bleeding)       No . Neck pain and posterior head pain  Protocols used: DIZZINESS [employer]

## 2020-09-30 NOTE — Telephone Encounter (Signed)
Returned call to patient who states that he has experienced pain in his head for a long time. Patient stated that he has tried to reach Dr. Malvin Johns, the neurologist and has been unable to reach someone in the office. MRI of head was done 3 months ago and has another one ordered on tomorrow.Dr. Lindaann Pascal is at [facility] and is the pt's PCP. Explained to patient that if the appointment times can not be scheduled for a sooner time he may need to return to the ED for current symptoms. Patient states he does not know what else he is supposed to do. Call ended by the patient.

## 2020-09-30 NOTE — Telephone Encounter (Signed)
Patient called and said he has pain in his head and neck ,he has had this issue for 3 months.Today he says it feels like pressure in the back of his head he says he sometimes feel light headed he thinks it may be nerve related . [phone] [id number]

## 2020-10-01 ENCOUNTER — Ambulatory Visit: Payer: 59 | Admitting: Physical Therapy

## 2020-10-02 ENCOUNTER — Other Ambulatory Visit: Payer: Self-pay

## 2020-10-02 ENCOUNTER — Emergency Department
Admission: EM | Admit: 2020-10-02 | Discharge: 2020-10-02 | Disposition: A | Discharge status: Home / Self Care | Payer: 59 | Attending: Emergency Medicine | Admitting: Emergency Medicine

## 2020-10-02 ENCOUNTER — Encounter: Payer: Self-pay | Admitting: Emergency Medicine

## 2020-10-02 DIAGNOSIS — G8929 Other chronic pain: Secondary | ICD-10-CM | POA: Diagnosis not present

## 2020-10-02 DIAGNOSIS — R519 Headache, unspecified: Secondary | ICD-10-CM | POA: Insufficient documentation

## 2020-10-02 DIAGNOSIS — Z79899 Other long term (current) drug therapy: Secondary | ICD-10-CM | POA: Diagnosis not present

## 2020-10-02 DIAGNOSIS — M542 Cervicalgia: Secondary | ICD-10-CM | POA: Diagnosis not present

## 2020-10-02 MED ORDER — PREDNISONE 10 MG (21) PO TBPK: ORAL_TABLET | ORAL | 0 refills | Status: DC

## 2020-10-02 NOTE — ED Triage Notes (Signed)
Pt to ER with c/o neck pain and headache. Pt states problems for several months and has been seen by several providers for same.  Pt states has a copy of MRI performed yesterday, but has not been given results yet. Pt states now having shoulder and back pain as well.  Pt states feels like it is a progression of the same problem.

## 2020-10-02 NOTE — Discharge Instructions (Signed)
Take tapered steroid as directed. Please follow-up with neurology. Please await MRI results of your neck.

## 2020-10-02 NOTE — ED Provider Notes (Signed)
Emergency Department Provider Note  ____________________________________________  Time seen: Approximately 6:06 PM  I have reviewed the triage vital signs and the nursing notes.   HISTORY  Chief Complaint Neck Pain   Historian Patient     HPI Nicholas Johnson. is a 29 y.o. male presents to the emergency department with chronic headache and neck pain.  Patient states that he has had these issues for the past 2 years and has had extensive work-ups with CTs of the cervical spine and MRI brain.  Patient is currently awaiting results for the MRI of his cervical spine.  Patient is coming into the emergency department today because he would like the images to be uploaded so that a radiologist will review them quickly.  Patient describes the pain as achy but can be occasionally sharp and radiates up his neck into his head.  He denies changes in vision no numbness or tingling in the upper and lower extremities.  He has taken muscle relaxers, anti-inflammatories, Valium and Fioricet in the past with no relief.  He has been seen by neurology and neurosurgery in the past but states that he has not been consistent about following up with the specialist.  No fever or chills today.  No other alleviating measures have been attempted.   Past Medical History:  Diagnosis Date  . Nasal congestion   . Parotid gland pain   . Psychosis (HCC)    admitted 12/15     Immunizations up to date:  Yes.     Past Medical History:  Diagnosis Date  . Nasal congestion   . Parotid gland pain   . Psychosis [facility])    admitted 12/15    Patient Active Problem List   Diagnosis Date Noted  . Right leg weakness 07/20/2020  . Injury of left leg 07/09/2020  . Low back pain 06/10/2020  . Pain in joint of right hip 06/10/2020  . 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 Altered mental status   . Psychoses (HCC)   . Parotid gland pain   . Nasal congestion     Past Surgical History:  Procedure Laterality Date  .  WISDOM TOOTH EXTRACTION     25     Prior to Admission medications   Medication Sig Start Date End Date Taking? Authorizing Provider  butalbital-acetaminophen-caffeine (FIORICET) 50-325-40 MG tablet Take 1 tablet by mouth as needed. 09/19/20   [provider]  carisoprodol (SOMA) 350 MG tablet Take 1 tablet by mouth in the morning, at noon, and at bedtime. 09/20/20   [provider]  Cholecalciferol (VITAMIN D-3) 125 MCG (5000 UT) TABS Take 1 tablet by mouth daily. Patient not taking: Reported on 09/22/2020 09/15/20   Hilts, Casimiro Needle, MD  Cholecalciferol 1.25 MG (50000 UT) capsule cholecalciferol (vitamin D3) 1,250 mcg (50,000 unit) capsule    [provider]  gabapentin (NEURONTIN) 300 MG capsule Take by mouth. 06/17/20   [provider]  METAMUCIL FIBER PO Take 1 Dose by mouth as needed.    [provider]  methocarbamol (ROBAXIN) 500 MG tablet Take 1-2 tablets (500-1,000 mg total) by mouth 2 (two) times daily. 09/15/20   Gailen Shelter, PA  naproxen (NAPROSYN) 500 MG tablet Take 500 mg by mouth 2 (two) times daily. 09/17/20   [provider]  omeprazole (PRILOSEC) 20 MG capsule Take 1 capsule (20 mg total) by mouth daily. 09/22/20   Unk Lightning, PA  polyethylene glycol powder (GLYCOLAX/MIRALAX) 17 GM/SCOOP powder Take 17 g by mouth  as needed.    [provider]  predniSONE (STERAPRED UNI-PAK 21 TAB) 10 MG (21) TBPK tablet Take 6 tablets the first day, take 5 tablets the second day, take 4 tablets the third day, take 3 tablets the fourth day, take 2 tablets the fifth day, take 1 tablet the sixth day. 10/02/20   Orvil Feil, PA-C  rizatriptan (MAXALT) 10 MG tablet Take 10 mg by mouth as directed. Patient not taking: Reported on 09/22/2020 09/06/20   [provider]  tiZANidine (ZANAFLEX) 2 MG tablet Take 1-2 tablets (2-4 mg total) by mouth every 6 (six) hours as needed for muscle spasms. 09/15/20   Hilts, Casimiro Needle,  MD  topiramate (TOPAMAX) 25 MG tablet Take by mouth. 09/06/20   [provider]  Vitamin D, Ergocalciferol, (DRISDOL) 1.25 MG (50000 UNIT) CAPS capsule Take 50,000 Units by mouth once a week. 09/18/20   [provider]    Allergies Patient has no known allergies.  Family History  Problem Relation Age of Onset  . Colon cancer Neg Hx   . Esophageal cancer Neg Hx   . Pancreatic cancer Neg Hx   . Stomach cancer Neg Hx   . Liver disease Neg Hx     Social History Social History   Tobacco Use  . Smoking status: Never Smoker  . Smokeless tobacco: Never Used  Vaping Use  . Vaping Use: Never used  Substance Use Topics  . Alcohol use: No  . Drug use: Not Currently    Types: Marijuana    Comment: several years ago as of 01/07/20     Review of Systems  Constitutional: No fever/chills Eyes:  No discharge ENT: No upper respiratory complaints. Respiratory: no cough. No SOB/ use of accessory muscles to breath Gastrointestinal:   No nausea, no vomiting.  No diarrhea.  No constipation. Musculoskeletal: Patient has neck pain.  Skin: Negative for rash, abrasions, lacerations, ecchymosis.    ____________________________________________   PHYSICAL EXAM:  VITAL SIGNS: ED Triage Vitals  Enc Vitals Group     BP 10/02/20 1547 128/76     Pulse Rate 10/02/20 1547 91     Resp 10/02/20 1547 18     Temp 10/02/20 1547 99.2 F (37.3 C)     Temp Source 10/02/20 1547 Oral     SpO2 10/02/20 1547 98 %     Weight 10/02/20 1545 128 lb 4 oz (58.2 kg)     Height 10/02/20 1545 5\' 7"  (1.702 m)     Head Circumference --      Peak Flow --      Pain Score 10/02/20 1544 6     Pain Loc --      Pain Edu? --      Excl. in GC? --      Constitutional: Alert and oriented. Well appearing and in no acute distress. Eyes: Conjunctivae are normal. PERRL. EOMI. Head: Atraumatic. Neck: No stridor.  Full range of motion. Cardiovascular: Normal rate, regular rhythm. Normal S1 and S2.   Good peripheral circulation. Respiratory: Normal respiratory effort without tachypnea or retractions. Lungs CTAB. Good air entry to the bases with no decreased or absent breath sounds Gastrointestinal: Bowel sounds x 4 quadrants. Soft and nontender to palpation. No guarding or rigidity. No distention. Musculoskeletal: Full range of motion to all extremities. No obvious deformities noted Neurologic:  Normal for age. No gross focal neurologic deficits are appreciated.  Skin:  Skin is warm, dry and intact. No rash noted. Psychiatric: Mood and affect  are normal for age. Speech and behavior are normal.   ____________________________________________   LABS (all labs ordered are listed, but only abnormal results are displayed)  Labs Reviewed - No data to display ____________________________________________  EKG   ____________________________________________  RADIOLOGY   No results found.  ____________________________________________    PROCEDURES  Procedure(s) performed:     Procedures     Medications - No data to display   ____________________________________________   INITIAL IMPRESSION / ASSESSMENT AND PLAN / ED COURSE  Pertinent labs & imaging results that were available during my care of the patient were reviewed by me and considered in my medical decision making (see chart for details).      Assessment and plan Neck pain Headache 29 year old male presents to the emergency department with chronic neck pain and headache.  Explained to patient that we could visualize MRI results of the cervical spine but we could not upload images for an interpretation by radiology.  Patient anticipates having results for the MRI of his cervical spine within the next several days.  Recommended starting a taper of steroid as patient is concerned that mild bulging disc was visualized on prior CT of the cervical spine.  Patient has been given referrals to neurosurgery in the past, Dr.  Marcell Barlow.  Will have patient follow-up with neurology as well.  All patient questions were answered.   ____________________________________________  FINAL CLINICAL IMPRESSION(S) / ED DIAGNOSES  Final diagnoses:  Neck pain      NEW MEDICATIONS STARTED DURING THIS VISIT:  ED Discharge Orders         Ordered    predniSONE (STERAPRED UNI-PAK 21 TAB) 10 MG (21) TBPK tablet        10/02/20 1801              This chart was dictated using voice recognition software/Dragon. Despite best efforts to proofread, errors can occur which can change the meaning. Any change was purely unintentional.     Orvil Feil, PA-C 10/02/20 1812    Sharman Cheek, MD 10/02/20 [phone]

## 2020-10-02 NOTE — ED Notes (Signed)
See triage note, pt reports head and neck pain, has been seen for same by PCP and reffered to neuro, reports just had MRI done. States he is here because he is still in pain and he brought "his images from MRI" and he wants someone to look at them and he wants results Pt in NAD, playing on cellphone in treatment room.

## 2020-10-04 ENCOUNTER — Telehealth (INDEPENDENT_AMBULATORY_CARE_PROVIDER_SITE_OTHER): Payer: 59 | Admitting: Family

## 2020-10-04 ENCOUNTER — Other Ambulatory Visit: Payer: Self-pay

## 2020-10-04 ENCOUNTER — Encounter: Payer: Self-pay | Admitting: Family

## 2020-10-04 ENCOUNTER — Telehealth: Payer: Self-pay

## 2020-10-04 DIAGNOSIS — Z7689 Persons encountering health services in other specified circumstances: Secondary | ICD-10-CM | POA: Diagnosis not present

## 2020-10-04 DIAGNOSIS — G8929 Other chronic pain: Secondary | ICD-10-CM | POA: Diagnosis not present

## 2020-10-04 DIAGNOSIS — Z2821 Immunization not carried out because of patient refusal: Secondary | ICD-10-CM

## 2020-10-04 DIAGNOSIS — M542 Cervicalgia: Secondary | ICD-10-CM | POA: Diagnosis not present

## 2020-10-04 DIAGNOSIS — M25551 Pain in right hip: Secondary | ICD-10-CM

## 2020-10-04 MED ORDER — DICLOFENAC SODIUM 1 % EX GEL: 4.0000 g | Freq: Four times a day (QID) | CUTANEOUS | 0 refills | Status: DC

## 2020-10-04 NOTE — Telephone Encounter (Signed)
Pt would like a call back about est care today with amy. He didn't understand that was what the appointment was for. Also wants to know about his referral to [facility]

## 2020-10-04 NOTE — Progress Notes (Addendum)
Virtual Visit via Video Conference Note  I connected with Nicholas Kimberley., on 10/04/2020 at 9:45 AM by video conference due to the COVID-19 pandemic and verified that I am speaking with the correct person using two identifiers.  Due to current restrictions/limitations of in-office visits due to the COVID-19 pandemic, this scheduled clinical appointment was converted to a telehealth visit.   Consent: I discussed the limitations, risks, security and privacy concerns of performing an evaluation and management service by telephone and the availability of in person appointments. I also discussed with the patient that there may be a patient responsible charge related to this service. The patient expressed understanding and agreed to proceed.  Location of Patient: Home  Location of Provider: [facility]  Persons participating in Telemedicine visit: Emily Forse. Ricky Stabs, NP Margorie John, CMA  History of Present Illness: Nicholas Johnson. with history of parotid gland pain, right leg weakness, nasal congestion, altered mental status, psychoses, psychosis, injury of left leg, low back pain, and pain in joint of right hip who presents to establish care and follow-up of chronic neck pain and leg pain.    1. NECK PAIN FOLLOW UP: 10/02/2020: Visit at [facility] Emergency Department for chronic neck pain and headache. Began on taper of steroid as patient was concerned that mild bulging disc was visualized on prior CT of the cervical spine. Given referrals to Neurosurgery in th past, Dr. Marcell Barlow. Will have patient follow-up with Neurology as well. All questions answered.    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 Says he further injured his neck several months after that incident while helping a friend  move items from a house. Reports he was recently seen by Dr. Lindaann Pascal at [facility]. Says he had an MRI of the neck 3 days ago and has not received results yet. Does plan to follow-up with sometime this week. Says he has made at least two Neurology appointments in hopes of being seen soon. Was told by both appointments that he would be unable to be seen because his health insurance is not in network. However, says he does have an appointment with Neurosurgery in January 2022 and plans to keep that appointment. Requesting referral to [facility] in [place], [place].  Status: uncontrolled Treatments attempted: rest, APAP, ibuprofen, aleve, muscle relaxer, and steroids Compliant with recommended treatment: no Relief with NSAIDs?:  no Location: midline Duration: months Severity: flucuates  Quality: sharp and pressure-like Frequency: constant Radiation: none Aggravating factors: movement, walking, laying down  Alleviating factors: nothing  2. 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 Says he does have an appointment with [facility] in 3 days but unsure if his health insurance will be accepted. Says he was told previously that he may have nerve pain/nerve damage in the right hip.  Duration: months April 2021  Involved hip: right  Mechanism of injury: trauma Location: diffuse Onset: sudden  Severity: fluctuates  Quality: burning, sharp and dull Radiation: right leg Aggravating factors: sitting  and walking   Alleviating factors: rest helps some, has tried several over-the-counter medications without relief Status: fluctuating Treatments attempted: rest, APAP, ibuprofen and aleve   Relief with NSAIDs?:  no Swelling: no  Past Medical History:  Diagnosis Date  . Nasal congestion   . Parotid gland pain   . Psychosis (HCC)    admitted 12/15   Allergies  Allergen Reactions  . Peanut-Containing Drug Products   . Wheat Bran    Past Surgical History:  Procedure Laterality Date  . WISDOM TOOTH EXTRACTION     25    Family History  Problem Relation Age of Onset  . Colon cancer Neg Hx   . Esophageal cancer Neg Hx   . Pancreatic cancer Neg Hx   . Stomach cancer Neg Hx   . Liver disease Neg Hx    Father, living: hypertension and asthma  Social History   Socioeconomic History  . Marital status: Single    Spouse name: Not on file  . Number of children: Not on file  . Years of education: Not on file  . Highest education level: Not on file  Occupational History  . Not on file  Tobacco Use  . Smoking status: Never Smoker  . Smokeless tobacco: Never Used  Vaping Use  . Vaping Use: Never used  Substance and Sexual Activity  . Alcohol use: No  . Drug use: Not Currently    Types: Marijuana    Comment: several years ago as of 01/07/20  . Sexual activity: Yes  Other Topics Concern  . Not on file  Social History Narrative  . Not on file   Social Determinants of Health   Financial Resource Strain: Not on file  Food Insecurity: Not on file  Transportation Needs: Not on file  Physical Activity: Not on file  Stress: Not on file  Social Connections: Not on file  Intimate Partner Violence: Not on file    Current Outpatient Medications on File Prior to Visit  Medication Sig Dispense Refill  . butalbital-acetaminophen-caffeine (FIORICET) 50-325-40 MG tablet Take 1 tablet by mouth as needed.    . carisoprodol (SOMA) 350 MG tablet Take 1 tablet by mouth in the morning, at noon, and at bedtime.    . Cholecalciferol (VITAMIN D-3) 125 MCG (5000 UT) TABS Take 1 tablet by mouth daily. 90 tablet 3  . gabapentin (NEURONTIN) 300 MG capsule Take by mouth.    . methocarbamol (ROBAXIN) 500  MG tablet Take 1-2 tablets (500-1,000 mg total) by mouth 2 (two) times daily. 30 tablet 0  . naproxen (NAPROSYN) 500 MG tablet Take 500 mg by mouth 2 (two) times daily.    . predniSONE (STERAPRED UNI-PAK 21 TAB) 10 MG (21) TBPK tablet Take 6 tablets the first day, take 5 tablets the second day, take 4 tablets the third day, take 3 tablets the fourth day, take 2 tablets the fifth day, take 1 tablet the sixth day. 21 tablet 0  . Vitamin D, Ergocalciferol, (DRISDOL) 1.25 MG (50000 UNIT) CAPS capsule Take 50,000 Units by mouth once a week.    . Cholecalciferol 1.25 MG (50000 UT) capsule cholecalciferol (vitamin D3) 1,250 mcg (50,000 unit) capsule (Patient not taking: Reported on 10/04/2020)    . METAMUCIL FIBER PO Take 1 Dose by mouth as needed.    Marland Kitchen omeprazole (PRILOSEC) 20 MG capsule Take 1 capsule (20 mg total) by mouth daily. (Patient not taking: Reported on 10/04/2020) 30 capsule 2  . polyethylene glycol powder (GLYCOLAX/MIRALAX) 17 GM/SCOOP  powder Take 17 g by mouth as needed. (Patient not taking: Reported on 10/04/2020)    . rizatriptan (MAXALT) 10 MG tablet Take 10 mg by mouth as directed. (Patient not taking: No sig reported)    . tiZANidine (ZANAFLEX) 2 MG tablet Take 1-2 tablets (2-4 mg total) by mouth every 6 (six) hours as needed for muscle spasms. (Patient not taking: Reported on 10/04/2020) 60 tablet 1  . topiramate (TOPAMAX) 25 MG tablet Take by mouth. (Patient not taking: Reported on 10/04/2020)     No current facility-administered medications on file prior to visit.    Observations/Objective: Alert and oriented x 3. Not in acute distress. Physical examination not completed as this is a telemedicine visit.  Assessment and Plan: 1. Encounter to establish care: - Patient presents today to establish care with primary provider.  - Return in 4 to 6 weeks for annual physical examination and labs. Patient agreeable.   2. Chronic neck pain: - Patient with chronic posterior midline neck  pain since April 2021. 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 Reports no longer working since then. Neck further injured several months later while helping someone move items out of a house.  - Has used several over-the-counter and prescribed medications without relief.  - Will try course of Diclofenac gel for pain management.  - Patient reports had an MRI of neck 3 days ago. Patient plans to follow-up with ordering physician sometime this week regarding results.  - CT cervical spine negative 09/08/2020. - CT head negative 09/08/2020. - Per patient request referral to Neurology for further evaluation and management. Patient requesting to be referred to [facility] in [place], [place].  - Follow-up with primary provider as needed. - Ambulatory referral to Neurology - diclofenac Sodium (VOLTAREN) 1 % GEL; Apply 4 g topically 4 (four) times daily.  Dispense: 100 g; Refill: 0  3. 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 Neck further injured several months later while helping someone move items out of a house.   - Shortly after injury a diagnostic x-ray of right hip resulted without abnormalities on 03/05/2020.  - Previously told pain may be related to nerve pain/nerve damage. - Will try course of Diclofenac gel for pain management.  - Does have an appointment with [facility] on 10/07/2020. - Follow-up with primary provider as needed.  - diclofenac Sodium (VOLTAREN) 1 % GEL; Apply 4 g topically 4 (four) times daily.  Dispense: 100 g; Refill: 0  4. Influenza vaccine refused: - Declined influenza vaccine.   Follow Up Instructions: Please schedule annual physical examination in 4 to 6 weeks or sooner if needed. Referral to Neurology per patient request.    Patient was given clear instructions to go to Emergency Department or return to medical  center if symptoms don't improve, worsen, or new problems develop.The patient verbalized understanding.  I discussed the assessment and treatment plan with the patient. The patient was provided an opportunity to ask questions and all were answered. The patient agreed with the plan and demonstrated an understanding of the instructions.   The patient was advised to call back or seek an in-person evaluation if the symptoms worsen or if the condition fails to improve as anticipated.   I provided 60 minutes total of non-face-to-face time during this encounter including median intraservice time, reviewing previous notes, labs, imaging, medications, management and  patient verbalized understanding.   Rema FendtAmy J Hollan Philipp, NP  [facility] [place], [place] [phone] 10/04/2020, 3:32 PM

## 2020-10-04 NOTE — Progress Notes (Signed)
Neck  Pain 6 mos ago.  leg injury 4 mos ago.  Would like neurologist referral

## 2020-10-04 NOTE — Patient Instructions (Signed)
Cervical Radiculopathy ° °Cervical radiculopathy happens when a nerve in the neck (a cervical nerve) is pinched or bruised. This condition can happen because of an injury to the cervical spine (vertebrae) in the neck, or as part of the normal aging process. Pressure on the cervical nerves can cause pain or numbness that travels from the neck all the way down into the arm and fingers. Usually, this condition gets better with rest. Treatment may be needed if the condition does not improve. °What are the causes? °This condition may be caused by: °· A neck injury. °· A bulging (herniated) disk. °· Muscle spasms. °· Muscle tightness in the neck because of overuse. °· Arthritis. °· Breakdown or degeneration in the bones and joints of the spine (spondylosis) due to aging. °· Bone spurs that may develop near the cervical nerves. °What are the signs or symptoms? °Symptoms of this condition include: °· Pain. The pain may travel from the neck to the arm and hand. The pain can be severe or irritating. It may be worse when you move your neck. °· Numbness or tingling in your arm or hand. °· Weakness in the affected arm and hand, in severe cases. °How is this diagnosed? °This condition may be diagnosed based on your symptoms, your medical history, and a physical exam. You may also have tests, including: °· X-rays. °· A CT scan. °· An MRI. °· An electromyogram (EMG). °· Nerve conduction tests. °How is this treated? °In many cases, treatment is not needed for this condition. With rest, the condition usually gets better over time. If treatment is needed, options may include: °· Wearing a soft neck collar (cervical collar) for short periods of time, as told by your health care provider. °· Doing physical therapy to strengthen your neck muscles. °· Taking medicines, such as NSAIDs or oral corticosteroids. °· Having spinal injections, in severe cases. °· Having surgery. This may be needed if other treatments do not help. Different types  of surgery may be done depending on the cause of this condition. °Follow these instructions at home: °If you have a cervical collar: °· Wear it as told by your health care provider. Remove it only as told by your health care provider. °· Ask your health care provider if you can remove the collar for cleaning and bathing. If you are allowed to remove the collar for cleaning or bathing: °? Follow instructions from your health care provider about how to remove the collar safely. °? Clean the collar by wiping it with mild soap and water and drying it completely. °? Take out any removable pads in the collar every 1-2 days, and wash them by hand with soap and water. Let them air-dry completely before you put them back in the collar. °? Check your skin under the collar for irritation or sores. If you see any, tell your health care provider. °Managing pain ° °  ° °· Take over-the-counter and prescription medicines only as told by your health care provider. °· If directed, put ice on the affected area. °? If you have a soft neck collar, remove it as told by your health care provider. °? Put ice in a plastic bag. °? Place a towel between your skin and the bag. °? Leave the ice on for 20 minutes, 2-3 times a day. °· If applying ice does not help, you can try using heat. Use the heat source that your health care provider recommends, such as a moist heat pack or a heating pad. °?   Place a towel between your skin and the heat source. °? Leave the heat on for 20-30 minutes. °? Remove the heat if your skin turns bright red. This is especially important if you are unable to feel pain, heat, or cold. You may have a greater risk of getting burned. °· Try a gentle neck and shoulder massage to help relieve symptoms. °Activity °· Rest as needed. °· Return to your normal activities as told by your health care provider. Ask your health care provider what activities are safe for you. °· Do stretching and strengthening exercises as told by  your health care provider or physical therapist. °· Do not lift anything that is heavier than 10 lb (4.5 kg) until your health care provider tells you that it is safe. °General instructions °· Use a flat pillow when you sleep. °· Do not drive while wearing a cervical collar. If you do not have a cervical collar, ask your health care provider if it is safe to drive while your neck heals. °· Ask your health care provider if the medicine prescribed to you requires you to avoid driving or using heavy machinery. °· Do not use any products that contain nicotine or tobacco, such as cigarettes, e-cigarettes, and chewing tobacco. These can delay healing. If you need help quitting, ask your health care provider. °· Keep all follow-up visits as told by your health care provider. This is important. °Contact a health care provider if: °· Your condition does not improve with treatment. °Get help right away if: °· Your pain gets much worse and cannot be controlled with medicines. °· You have weakness or numbness in your hand, arm, face, or leg. °· You have a high fever. °· You have a stiff, rigid neck. °· You lose control of your bowels or your bladder (have incontinence). °· You have trouble with walking, balance, or speaking. °Summary °· Cervical radiculopathy happens when a nerve in the neck is pinched or bruised. °· A nerve can get pinched from a bulging disk, arthritis, muscle spasms, or an injury to the neck. °· Symptoms include pain, tingling, or numbness radiating from the neck into the arm or hand. Weakness can also occur in severe cases. °· Treatment may include rest, wearing a cervical collar, and physical therapy. Medicines may be prescribed to help with pain. In severe cases, injections or surgery may be needed. °This information is not intended to replace advice given to you by your health care provider. Make sure you discuss any questions you have with your health care provider. °Document Revised: 08/30/2018  Document Reviewed: 08/30/2018 °[employer]vier Patient Education © 2020 [employer] ° °

## 2020-10-04 NOTE — Telephone Encounter (Signed)
Pt contacted and advised that referral was put in to his requested neurologist [facility] neurology) advised that if he doesn't hear something in regards to referral in week give [place] a call. Pt stated that he was not able to see his PCP and he didn't want to be charged for having 2 PCP advised him that he would need to make decision of who he will like to have for his continuity of care

## 2020-10-05 ENCOUNTER — Telehealth: Payer: Self-pay

## 2020-10-05 ENCOUNTER — Ambulatory Visit: Payer: Self-pay

## 2020-10-05 ENCOUNTER — Telehealth: Payer: Self-pay | Admitting: Family

## 2020-10-05 NOTE — Telephone Encounter (Signed)
Patient called and says he has neck pain that came from his injury at work, a fall, 4 months ago. He says the pain is to the back of his neck and goes up into his head. He says the pain is a 9-10. He says he's taken the muscle relaxer that's prescribed as well as the gabapentin, but nothing is helping the pain at this point. I advised to go to the ED and call his PCP tomorrow, patient verbalized understanding.   Reason for Disposition . [1] SEVERE neck pain (e.g., excruciating, unable to do any normal activities) AND [2] not improved after 2 hours of pain medicine  Answer Assessment - Initial Assessment Questions 1. ONSET: "When did the pain begin?"      4 months ago after an injury, fall at work 2. LOCATION: "Where does it hurt?"      Back of neck 3. PATTERN "Does the pain come and go, or has it been constant since it started?"      Constant 4. SEVERITY: "How bad is the pain?"  (Scale 1-10; or mild, moderate, severe)   - NO PAIN (0): no pain or only slight stiffness    - MILD (1-3): doesn't interfere with normal activities    - MODERATE (4-7): interferes with normal activities or awakens from sleep    - SEVERE (8-10):  excruciating pain, unable to do any normal activities      9-10 5. RADIATION: "Does the pain go anywhere else, shoot into your arms?"     Goes in the head, into shoulders depending on movement 6. CORD SYMPTOMS: "Any weakness or numbness of the arms or legs?"      Yes a little weakness in arms 7. CAUSE: "What do you think is causing the neck pain?"     Caused from a fall/injury at work 4 months ago 8. NECK OVERUSE: "Any recent activities that involved turning or twisting the neck?"     Not that I'm aware 9. OTHER SYMPTOMS: "Do you have any other symptoms?" (e.g., headache, fever, chest pain, difficulty breathing, neck swelling)     Pain in the head 10. PREGNANCY: "Is there any chance you are pregnant?" "When was your last menstrual period?"      N/A  Protocols used:  NECK PAIN OR STIFFNESS-A-AH

## 2020-10-05 NOTE — Telephone Encounter (Signed)
Patient called  To let you know  That the neurology referral need to be fax to  [facility]  [phone] Attn Joey and please, let him know when the referral was done  . Thank you

## 2020-10-06 ENCOUNTER — Telehealth (INDEPENDENT_AMBULATORY_CARE_PROVIDER_SITE_OTHER): Payer: Self-pay

## 2020-10-06 ENCOUNTER — Encounter (HOSPITAL_COMMUNITY): Payer: Self-pay

## 2020-10-06 ENCOUNTER — Emergency Department (HOSPITAL_COMMUNITY)
Admission: EM | Admit: 2020-10-06 | Discharge: 2020-10-06 | Disposition: A | Discharge status: Home / Self Care | Payer: 59 | Attending: Emergency Medicine | Admitting: Emergency Medicine

## 2020-10-06 DIAGNOSIS — Z5321 Procedure and treatment not carried out due to patient leaving prior to being seen by health care provider: Secondary | ICD-10-CM | POA: Insufficient documentation

## 2020-10-06 DIAGNOSIS — M542 Cervicalgia: Secondary | ICD-10-CM | POA: Diagnosis not present

## 2020-10-06 DIAGNOSIS — M79609 Pain in unspecified limb: Secondary | ICD-10-CM | POA: Insufficient documentation

## 2020-10-06 NOTE — ED Notes (Signed)
Pt states that he will be leaving. Armband removed.

## 2020-10-06 NOTE — ED Triage Notes (Signed)
Pt reports continued leg pain and neck pain from an accident that happened 6 months ago, had MRI done recently.

## 2020-10-07 ENCOUNTER — Telehealth: Payer: Self-pay

## 2020-10-07 NOTE — Telephone Encounter (Signed)
Called the pt and informed him that [facility] would not see him and that [facility] would see him anymore due to the harassment of the office. He called [facility] 20 times a day spoke with there PA and they refuse to see him

## 2020-10-11 NOTE — Telephone Encounter (Signed)
Patient called to provide office staff with information regarding referral.

## 2020-10-21 DIAGNOSIS — M5030 Other cervical disc degeneration, unspecified cervical region: Secondary | ICD-10-CM | POA: Insufficient documentation

## 2020-10-27 ENCOUNTER — Ambulatory Visit: Payer: 59 | Admitting: Nurse Practitioner

## 2020-10-27 DIAGNOSIS — G4486 Cervicogenic headache: Secondary | ICD-10-CM | POA: Insufficient documentation

## 2020-10-27 DIAGNOSIS — R519 Headache, unspecified: Secondary | ICD-10-CM | POA: Insufficient documentation

## 2020-11-02 ENCOUNTER — Ambulatory Visit
Payer: 59 | Attending: Student in an Organized Health Care Education/Training Program | Admitting: Student in an Organized Health Care Education/Training Program

## 2020-11-02 ENCOUNTER — Encounter: Payer: Self-pay | Admitting: Student in an Organized Health Care Education/Training Program

## 2020-11-02 VITALS — BP 116/73 | HR 84 | Temp 97.3°F | Resp 16 | Ht 68.0 in | Wt 125.0 lb

## 2020-11-02 DIAGNOSIS — M25551 Pain in right hip: Secondary | ICD-10-CM | POA: Diagnosis not present

## 2020-11-02 DIAGNOSIS — M25511 Pain in right shoulder: Secondary | ICD-10-CM

## 2020-11-02 DIAGNOSIS — M542 Cervicalgia: Secondary | ICD-10-CM | POA: Diagnosis not present

## 2020-11-02 DIAGNOSIS — M25512 Pain in left shoulder: Secondary | ICD-10-CM

## 2020-11-02 DIAGNOSIS — G8929 Other chronic pain: Secondary | ICD-10-CM

## 2020-11-02 DIAGNOSIS — M79604 Pain in right leg: Secondary | ICD-10-CM | POA: Diagnosis not present

## 2020-11-02 DIAGNOSIS — S73191D Other sprain of right hip, subsequent encounter: Secondary | ICD-10-CM | POA: Diagnosis not present

## 2020-11-02 NOTE — Progress Notes (Signed)
Patient: Nicholas Johnson.  Service Category: E/M  Provider: Gillis Santa, MD  DOB: Oct 30, 1990  DOS: 11/02/2020  Referring Provider: Diamond Nickel, DO  MRN: [id number]  Setting: Ambulatory outpatient  PCP: Shanon Rosser, PA-C  Type: New Patient  Specialty: Interventional Pain Management    Location: Office  Delivery: Face-to-face     Primary Reason(s) for Visit: Encounter for initial evaluation of one or more chronic problems (new to examiner) potentially causing chronic pain, and posing a threat to normal musculoskeletal function. (Level of risk: High) CC: Hip Pain (right), Neck Pain (Radiates to back of head), and Pain (Shoulder bilat)  HPI  Nicholas Johnson is a 30 y.o. year old, male patient, who comes for the first time to our practice referred by Diamond Nickel, DO for our initial evaluation of his chronic pain. He has Parotid gland pain; Nasal congestion; Altered mental status; Psychoses ([place]); Psychosis ([place]); Injury of left leg; Low back pain; Pain in joint of right hip; and Right leg weakness on their problem list. Today he comes in for evaluation of his Hip Pain (right), Neck Pain (Radiates to back of head), and Pain (Shoulder bilat)  Pain Assessment: Location: Right Hip Radiating: through right buttock down leg to bottom of foot including heel Onset: More than a month ago Duration: Chronic pain Quality: Burning,Aching,Sharp,Other (Comment) (stinging) Severity: 8 /10 (subjective, self-reported pain score)  Effect on ADL: limits daily activities, out of work Timing: Constant Modifying factors: layinng down, meds help some but never take pain away BP: 116/73  HR: 84  Onset and Duration: Sudden and Gradual Cause of pain: Unknown Severity: Getting worse, No change since onset, NAS-11 at its worse: 10/10, NAS-11 at its best: 3/10, NAS-11 now: 5/10 and NAS-11 on the average: 8/10 Timing: Not influenced by the time of the day Aggravating Factors: Climbing, Intercourse (sex), Kneeling,  Motion, Prolonged sitting, Walking, Walking uphill, Walking downhill and Working Alleviating Factors: Lying down and Resting Associated Problems: Constipation, Spasms and Weakness Quality of Pain: Aching, Agonizing, Annoying, Burning, Constant, Deep, Disabling, Distressing, Dreadful, Dull, Feeling of constriction, Horrible, Nagging, Pulsating, Sharp, Shooting, Stabbing, Tender and Throbbing Previous Examinations or Tests: Bone scan, CT scan, EMG/PNCV, MRI scan, X-rays, Nerve conduction test, Orthopedic evaluation and Chiropractic evaluation Previous Treatments: Physical Therapy, Relaxation therapy, Steroid treatments by mouth, Strengthening exercises and Stretching exercises  Nicholas Johnson is a pleasant 30 year old male who presents with a chief complaint of right hip pain.  He states that this started approximately last April when he sustained an injury getting out of his truck.  He has had persistent right hip pain since then.  He is working with an Secondary school teacher at [facility] for his right labral tear and has started physical therapy for this and has a follow-up with him next month to discuss surgical intervention.  He endorses primarily right hip pain right lateral leg pain that radiates down his thigh stopping at his knee.  He has had an extensive work-up performed including advanced imaging of his lumbar spine which was unremarkable for any canal, neuroforaminal stenosis, disc herniation, advanced facet arthropathy.  He has also had cervical MRI done which was unremarkable.  Majority of his neck pain is likely musculoskeletal.  He states that he also injured his neck when he was helping his friend move 2 weeks after his right hip injury.  He has been denied his Workmen's Compensation case on 2 occasions.  He has tried various medications in the past including gabapentin which was not  effective, he is currently on Lyrica which is managed by neurology.  He has tried various muscle relaxers including Flexeril,  Robaxin, tizanidine with limited benefit.  He is currently participating in physical therapy at this time.  He has also tried hydrocodone with limited benefit.  He endorsed constipation from such medication.  Patient has also been evaluated by neurology who are managing his Lyrica.   Historic Controlled Substance Pharmacotherapy Review  Historical Monitoring: The patient  reports previous drug use. Drug: Marijuana. List of all UDS Test(s): Lab Results  Component Value Date   COCAINSCRNUR NONE DETECTED 10/20/2014   THCU NONE DETECTED 10/20/2014   ETH <5 10/20/2014   List of other Serum/Urine Drug Screening Test(s):  Lab Results  Component Value Date   COCAINSCRNUR NONE DETECTED 10/20/2014   THCU NONE DETECTED 10/20/2014   ETH <5 10/20/2014   Historical Background Evaluation: [place] PMP: PDMP not reviewed this encounter. Online review of the past 105-monthperiod conducted.              [place] Department of public safety, offender search: (Editor, commissioningInformation) Non-contributory Risk Assessment Profile: Aberrant behavior: None observed or detected today Risk factors for fatal opioid overdose: None identified today Fatal overdose hazard ratio (HR): Calculation deferred Non-fatal overdose hazard ratio (HR): Calculation deferred Risk of opioid abuse or dependence: 0.7-3.0% with doses ? 36 MME/day and 6.1-26% with doses ? 120 MME/day. Substance use disorder (SUD) risk level: See below Personal History of Substance Abuse (SUD-Substance use disorder):  Alcohol: Negative  Illegal Drugs: Negative  Rx Drugs: Negative  ORT Risk Level calculation: Low Risk  Opioid Risk Tool - 11/02/20 1027      Family History of Substance Abuse   Alcohol Negative    Illegal Drugs Negative    Rx Drugs Negative      Personal History of Substance Abuse   Alcohol Negative    Illegal Drugs Negative    Rx Drugs Negative      Age   Age between 156-45years  Yes      History of Preadolescent Sexual Abuse   History  of Preadolescent Sexual Abuse Negative or Male      Psychological Disease   Psychological Disease Negative    Depression Negative      Total Score   Opioid Risk Tool Scoring 1    Opioid Risk Interpretation Low Risk          ORT Scoring interpretation table:  Score <3 = Low Risk for SUD  Score between 4-7 = Moderate Risk for SUD  Score >8 = High Risk for Opioid Abuse   PHQ-2 Depression Scale:  Total score:    PHQ-2 Scoring interpretation table: (Score and probability of major depressive disorder)  Score 0 = No depression  Score 1 = 15.4% Probability  Score 2 = 21.1% Probability  Score 3 = 38.4% Probability  Score 4 = 45.5% Probability  Score 5 = 56.4% Probability  Score 6 = 78.6% Probability   PHQ-9 Depression Scale:  Total score:    PHQ-9 Scoring interpretation table:  Score 0-4 = No depression  Score 5-9 = Mild depression  Score 10-14 = Moderate depression  Score 15-19 = Moderately severe depression  Score 20-27 = Severe depression (2.4 times higher risk of SUD and 2.89 times higher risk of overuse)   Pharmacologic Plan: Non-opioid analgesic therapy offered.            Initial impression: The patient indicated having no interest, at this time.  Meds   Current Outpatient Medications:  .  carisoprodol (SOMA) 350 MG tablet, Take 1 tablet by mouth in the morning, at noon, and at bedtime., Disp: , Rfl:  .  HYDROcodone-acetaminophen (NORCO/VICODIN) 5-325 MG tablet, Take 1 tablet by mouth every 6 (six) hours as needed for moderate pain., Disp: , Rfl:  .  METAMUCIL FIBER PO, Take 1 Dose by mouth as needed., Disp: , Rfl:  .  pregabalin (LYRICA) 25 MG capsule, Take 25 mg by mouth 2 (two) times daily., Disp: , Rfl:  .  Vitamin D, Ergocalciferol, (DRISDOL) 1.25 MG (50000 UNIT) CAPS capsule, Take 50,000 Units by mouth once a week., Disp: , Rfl:  .  polyethylene glycol powder (GLYCOLAX/MIRALAX) 17 GM/SCOOP powder, Take 17 g by mouth as needed. (Patient not taking: No sig  reported), Disp: , Rfl:   Imaging Review   Narrative CLINICAL DATA:  30 year old male with head and neck pain.  Headache.  EXAM: CT CERVICAL SPINE WITHOUT CONTRAST  TECHNIQUE: Multidetector CT imaging of the cervical spine was performed without intravenous contrast. Multiplanar CT image reconstructions were also generated.  COMPARISON:  Head CT today.  FINDINGS: Alignment: Straightening of cervical lordosis. Cervicothoracic junction alignment is within normal limits. Bilateral posterior element alignment is within normal limits.  Skull base and vertebrae: Visualized skull base is intact. No atlanto-occipital dissociation. No osseous abnormality identified.  Soft tissues and spinal canal: No prevertebral fluid or swelling. No visible canal hematoma. Negative visible noncontrast neck soft tissues.  Disc levels: Minor disc bulging suspected at C4-C5 and C5-C6. No significant degenerative changes are evident.  Upper chest: Negative.  IMPRESSION: Negative. No acute traumatic injury identified in the cervical spine.   Electronically Signed By: Genevie Ann M.D. On: 09/08/2020 22:06 CT Knee Right Wo Contrast  Narrative CLINICAL DATA:  Right knee pain beginning 4-5 days ago.  EXAM: CT OF THE right KNEE WITHOUT CONTRAST  TECHNIQUE: Multidetector CT imaging of the right knee was performed according to the standard protocol. Multiplanar CT image reconstructions were also generated.  COMPARISON:  Right knee radiographs/7/1  FINDINGS: Bones/Joint/Cartilage  Acute or healing fractures are present.  Ligaments  Suboptimally assessed by CT. Cruciate ligaments appear to be intact.  Muscles and Tendons  Acute or focal muscle abnormality is present. Patellar tendon is intact.  Soft tissues  No significant effusion present. Acute or focal soft tissue injury is evident.  IMPRESSION: No acute or focal fracture or soft tissue injury to explain patient's pain. No  significant effusion is present.   Electronically Signed By: San Morelle M.D. On: 02/27/2020 04:16    DG Knee Complete 4 Views Right  Narrative CLINICAL DATA:  Right knee pain after injury last week.  EXAM: RIGHT KNEE - COMPLETE 4+ VIEW  COMPARISON:  None.  FINDINGS: No evidence of fracture, dislocation, or joint effusion. No evidence of arthropathy or other focal bone abnormality. Soft tissues are unremarkable.  IMPRESSION: Negative.   Electronically Signed By: Marijo Conception M.D. On: 03/01/2020 16:52    Ankle Imaging: Ankle-R DG Complete: Results for orders placed during the hospital encounter of 02/27/20  DG Ankle Complete Right  Narrative CLINICAL DATA:  Ankle pain after jumping off truck  EXAM: RIGHT ANKLE - COMPLETE 3+ VIEW  COMPARISON:  None.  FINDINGS: Trace effusion. No acute bony abnormality of the ankle. Specifically, no fracture, subluxation, or dislocation. No significant soft tissue swelling. Ankle mortise is congruent. No visible osseous injury in the included portions of the mid and hindfoot.  IMPRESSION: No acute bony abnormality. Trace effusion.   Electronically Signed By: Lovena Le M.D. On: 02/27/2020 04:02  Complexity Note: Imaging results reviewed. Results shared with Mr. Prosise, using Layman's terms.                         ROS  Cardiovascular: No reported cardiovascular signs or symptoms such as High blood pressure, coronary artery disease, abnormal heart rate or rhythm, heart attack, blood thinner therapy or heart weakness and/or failure Pulmonary or Respiratory: No reported pulmonary signs or symptoms such as wheezing and difficulty taking a deep full breath (Asthma), difficulty blowing air out (Emphysema), coughing up mucus (Bronchitis), persistent dry cough, or temporary stoppage of breathing during sleep Neurological: No reported neurological signs or symptoms such as seizures, abnormal skin sensations,  urinary and/or fecal incontinence, being born with an abnormal open spine and/or a tethered spinal cord Psychological-Psychiatric: No reported psychological or psychiatric signs or symptoms such as difficulty sleeping, anxiety, depression, delusions or hallucinations (schizophrenial), mood swings (bipolar disorders) or suicidal ideations or attempts Gastrointestinal: Irregular, infrequent bowel movements (Constipation) Genitourinary: No reported renal or genitourinary signs or symptoms such as difficulty voiding or producing urine, peeing blood, non-functioning kidney, kidney stones, difficulty emptying the bladder, difficulty controlling the flow of urine, or chronic kidney disease Hematological: No reported hematological signs or symptoms such as prolonged bleeding, low or poor functioning platelets, bruising or bleeding easily, hereditary bleeding problems, low energy levels due to low hemoglobin or being anemic Endocrine: No reported endocrine signs or symptoms such as high or low blood sugar, rapid heart rate due to high thyroid levels, obesity or weight gain due to slow thyroid or thyroid disease Rheumatologic: No reported rheumatological signs and symptoms such as fatigue, joint pain, tenderness, swelling, redness, heat, stiffness, decreased range of motion, with or without associated rash Musculoskeletal: Negative for myasthenia gravis, muscular dystrophy, multiple sclerosis or malignant hyperthermia  Allergies  Mr. Blaustein is allergic to peanut-containing drug products and wheat bran.  Laboratory Chemistry Profile   Renal Lab Results  Component Value Date   BUN 14 10/14/2019   CREATININE 1.02 58/06/9832   BCR NOT APPLICABLE 82/50/5397   GFRAA 116 10/14/2019   GFRNONAA 100 10/14/2019   PROTEINUR NEGATIVE 09/12/2016     Electrolytes Lab Results  Component Value Date   NA 141 10/14/2019   K 4.4 10/14/2019   CL 103 10/14/2019   CALCIUM 9.9 10/14/2019     Hepatic Lab Results   Component Value Date   AST 17 10/14/2019   ALT 10 10/14/2019   ALBUMIN 5.6 (H) 10/20/2014   ALKPHOS 52 10/20/2014   LIPASE 7 10/14/2019     ID Lab Results  Component Value Date   HIV NON-REACTIVE 07/08/2018     Bone No results found for: VD25OH, QB341PF7TKW, IO9735HG9, JM4268TM1, 25OHVITD1, 25OHVITD2, 25OHVITD3, TESTOFREE, TESTOSTERONE   Endocrine Lab Results  Component Value Date   GLUCOSE 83 10/14/2019   GLUCOSEU NEGATIVE 09/12/2016   HGBA1C 5.4 10/22/2014   TSH 1.675 10/22/2014     Neuropathy Lab Results  Component Value Date   HGBA1C 5.4 10/22/2014   HIV NON-REACTIVE 07/08/2018     CNS No results found for: COLORCSF, APPEARCSF, RBCCOUNTCSF, WBCCSF, POLYSCSF, LYMPHSCSF, EOSCSF, PROTEINCSF, GLUCCSF, JCVIRUS, CSFOLI, IGGCSF, LABACHR, ACETBL, LABACHR, ACETBL   Inflammation (CRP: Acute  ESR: Chronic) No results found for: CRP, ESRSEDRATE, LATICACIDVEN   Rheumatology No results found for: RF, ANA, LABURIC, URICUR, LYMEIGGIGMAB, LYMEABIGMQN, HLAB27   Coagulation  Lab Results  Component Value Date   PLT 191 10/14/2019     Cardiovascular Lab Results  Component Value Date   HGB 15.1 10/14/2019   HCT 44.9 10/14/2019     Screening Lab Results  Component Value Date   HIV NON-REACTIVE 07/08/2018     Cancer No results found for: CEA, CA125, LABCA2   Allergens No results found for: ALMOND, APPLE, ASPARAGUS, AVOCADO, BANANA, BARLEY, BASIL, BAYLEAF, GREENBEAN, LIMABEAN, WHITEBEAN, BEEFIGE, REDBEET, BLUEBERRY, BROCCOLI, CABBAGE, MELON, CARROT, CASEIN, CASHEWNUT, CAULIFLOWER, CELERY     Note: Lab results reviewed.  PFSH  Drug: Mr. Gamero  reports previous drug use. Drug: Marijuana. Alcohol:  reports no history of alcohol use. Tobacco:  reports that he has never smoked. He has never used smokeless tobacco. Medical:  has a past medical history of Nasal congestion, Parotid gland pain, and Psychosis ([place]). Family: family history is not on file.  Past Surgical  History:  Procedure Laterality Date  . WISDOM TOOTH EXTRACTION     25    Active Ambulatory Problems    Diagnosis Date Noted  . Parotid gland pain   . Nasal congestion   . Altered mental status   . Psychoses ([place])   . Psychosis ([place]) 10/21/2014  . Injury of left leg 07/09/2020  . Low back pain 06/10/2020  . Pain in joint of right hip 06/10/2020  . Right leg weakness 07/20/2020   Resolved Ambulatory Problems    Diagnosis Date Noted  . No Resolved Ambulatory Problems   No Additional Past Medical History   Constitutional Exam  General appearance: Well nourished, well developed, and well hydrated. In no apparent acute distress Vitals:   11/02/20 1013  BP: 116/73  Pulse: 84  Resp: 16  Temp: (!) 97.3 F (36.3 C)  TempSrc: Temporal  SpO2: 99%  Weight: 125 lb (56.7 kg)  Height: 5' 8" (1.727 m)   BMI Assessment: Estimated body mass index is 19.01 kg/m as calculated from the following:   Height as of this encounter: 5' 8" (1.727 m).   Weight as of this encounter: 125 lb (56.7 kg).  BMI interpretation table: BMI level Category Range association with higher incidence of chronic pain  <18 kg/m2 Underweight   18.5-24.9 kg/m2 Ideal body weight   25-29.9 kg/m2 Overweight Increased incidence by 20%  30-34.9 kg/m2 Obese (Class I) Increased incidence by 68%  35-39.9 kg/m2 Severe obesity (Class II) Increased incidence by 136%  >40 kg/m2 Extreme obesity (Class III) Increased incidence by 254%   Patient's current BMI Ideal Body weight  Body mass index is 19.01 kg/m. Ideal body weight: 68.4 kg (150 lb 12.7 oz)   BMI Readings from Last 4 Encounters:  11/02/20 19.01 kg/m  10/02/20 20.09 kg/m  09/25/20 20.09 kg/m  09/22/20 20.09 kg/m   Wt Readings from Last 4 Encounters:  11/02/20 125 lb (56.7 kg)  10/02/20 128 lb 4 oz (58.2 kg)  09/25/20 128 lb 4 oz (58.2 kg)  09/22/20 128 lb 4 oz (58.2 kg)    Psych/Mental status: Alert, oriented x 3 (person, place, & time)       Eyes:  PERLA Respiratory: No evidence of acute respiratory distress  Cervical Spine Exam  Skin & Axial Inspection: No masses, redness, edema, swelling, or associated skin lesions Alignment: Symmetrical Functional ROM: Unrestricted ROM      Stability: No instability detected Muscle Tone/Strength: Functionally intact. No obvious neuro-muscular anomalies detected. Sensory (Neurological): Unimpaired Palpation: No palpable anomalies  Upper Extremity (UE) Exam    Side: Right upper extremity  Side: Left upper extremity  Skin & Extremity Inspection: Skin color, temperature, and hair growth are WNL. No peripheral edema or cyanosis. No masses, redness, swelling, asymmetry, or associated skin lesions. No contractures.  Skin & Extremity Inspection: Skin color, temperature, and hair growth are WNL. No peripheral edema or cyanosis. No masses, redness, swelling, asymmetry, or associated skin lesions. No contractures.  Functional ROM: Unrestricted ROM          Functional ROM: Unrestricted ROM          Muscle Tone/Strength: Functionally intact. No obvious neuro-muscular anomalies detected.   Muscle Tone/Strength: Functionally intact. No obvious neuro-muscular anomalies detected.  Sensory (Neurological): Unimpaired          Sensory (Neurological): Unimpaired          Palpation: No palpable anomalies              Palpation: No palpable anomalies              Provocative Test(s):  Phalen's test: deferred Tinel's test: deferred Apley's scratch test (touch opposite shoulder):  Action 1 (Across chest): deferred Action 2 (Overhead): deferred Action 3 (LB reach): deferred   Provocative Test(s):  Phalen's test: deferred Tinel's test: deferred Apley's scratch test (touch opposite shoulder):  Action 1 (Across chest): deferred Action 2 (Overhead): deferred Action 3 (LB reach): deferred    Thoracic Spine Area Exam  Skin & Axial Inspection: No masses, redness, or swelling Alignment:  Symmetrical Functional ROM: Unrestricted ROM Stability: No instability detected Muscle Tone/Strength: Functionally intact. No obvious neuro-muscular anomalies detected. Sensory (Neurological): Musculoskeletal pain pattern Muscle strength & Tone: No palpable anomalies  Lumbar Exam  Skin & Axial Inspection: No masses, redness, or swelling Alignment: Symmetrical Functional ROM: Pain restricted ROM affecting primarily the right Stability: No instability detected Muscle Tone/Strength: Functionally intact. No obvious neuro-muscular anomalies detected. Sensory (Neurological): Musculoskeletal pain pattern   Gait & Posture Assessment  Ambulation: Limited Gait: Antalgic gait (limping) Posture: Difficulty standing up straight, due to pain   Lower Extremity Exam    Side: Right lower extremity  Side: Left lower extremity  Stability: No instability observed          Stability: No instability observed          Skin & Extremity Inspection: Skin color, temperature, and hair growth are WNL. No peripheral edema or cyanosis. No masses, redness, swelling, asymmetry, or associated skin lesions. No contractures.  Skin & Extremity Inspection: Skin color, temperature, and hair growth are WNL. No peripheral edema or cyanosis. No masses, redness, swelling, asymmetry, or associated skin lesions. No contractures.  Functional ROM: Pain restricted ROM for all joints of the lower extremity          Functional ROM: Unrestricted ROM                  Muscle Tone/Strength: Movement possible against some resistance (4/5) hip flexion/extension  Muscle Tone/Strength: Functionally intact. No obvious neuro-muscular anomalies detected.  Sensory (Neurological): Arthropathic arthralgia        Sensory (Neurological): Unimpaired        DTR: Patellar: deferred today Achilles: deferred today Plantar: deferred today  DTR: Patellar: deferred today Achilles: deferred today Plantar: deferred today  Palpation: No palpable anomalies   Palpation: No palpable anomalies   Assessment  Primary Diagnosis & Pertinent Problem List: The primary encounter diagnosis was Tear of right acetabular labrum, subsequent encounter. Diagnoses of Right hip pain, Right  leg pain, Neck pain, and Chronic pain of both shoulders were also pertinent to this visit.  Visit Diagnosis (New problems to examiner): 1. Tear of right acetabular labrum, subsequent encounter   2. Right hip pain   3. Right leg pain   4. Neck pain   5. Chronic pain of both shoulders    Plan of Care (Initial workup plan)  Continue care with orthopedics to address right acetabular labrum tear.  Will likely require surgical intervention.  After review of patient's cervical and lumbar CT and MRI, I do not believe that this is coming from a spinal source.  Would not recommend any interventional procedures at this time until he has addressed his right labral tear.  Continue Lyrica as prescribed with neurology.  Do not recommend chronic opioid therapy for his condition.  Continue with physical therapy.  Discussed alternative integrative pain management techniques including acupuncture, acupressure, massage, mindfulness based therapies to help manage his pain.  Also encouraged the patient to continue with acetaminophen 500 mg 3 times daily as needed.  Provider-requested follow-up: Return if symptoms worsen or fail to improve.  No future appointments.  Note by: Gillis Santa, MD Date: 11/02/2020; Time: 11:29 AM

## 2020-11-02 NOTE — Progress Notes (Signed)
Safety precautions to be maintained throughout the outpatient stay will include: orient to surroundings, keep bed in low position, maintain call bell within reach at all times, provide assistance with transfer out of bed and ambulation.  

## 2020-11-03 ENCOUNTER — Telehealth: Payer: Self-pay | Admitting: Student in an Organized Health Care Education/Training Program

## 2020-11-03 ENCOUNTER — Telehealth: Payer: Self-pay

## 2020-11-03 NOTE — Telephone Encounter (Signed)
Patient advised of Dr. Garnett Farm advice.

## 2020-11-03 NOTE — Telephone Encounter (Signed)
Would like to speak with Nurse about what he needs to do regarding his pain. He states he cannot speed up the process for his surgery. His ortho is going to be out for 4 weeks. Wants to know why Dr. Cherylann Ratel sent him back to his ortho when he was sent here for pain management. He has other pain besides the hip and needs direction on what he needs to do. Wants to speak with a nurse or dr Cherylann Ratel.

## 2020-11-03 NOTE — Telephone Encounter (Signed)
Follow-up with PCP regarding nonopioid-based pain management.  Do not recommend chronic opioid therapy for his condition.  His primary care provider should be able to manage his nonopioid-based therapies.  He has a labral tear and needs to be managed by orthopedics.  If he is in severe pain I would recommend that he call orthopedics to have his evaluation appointment moved up.

## 2020-11-03 NOTE — Telephone Encounter (Signed)
He called and said he was told to speak with orthopaedics and go to PT. He said he needs to do something now for his pain. He feels that PT will take too long to help with his pain. He wants to know what he is supposed to do to deal with the pain until PT kicks in. He has pain all over his body.

## 2020-11-04 NOTE — Telephone Encounter (Signed)
I understand, but he doesn't want to speak with front office. Only a nurse or dr Cherylann Ratel. Can you please call

## 2020-11-04 NOTE — Telephone Encounter (Signed)
Spoke with Dr Cherylann Ratel, there is nothing more that  he will do, he needs to see his orthopedic or PCP.

## 2020-11-15 ENCOUNTER — Emergency Department
Admission: EM | Admit: 2020-11-15 | Discharge: 2020-11-15 | Disposition: A | Discharge status: Home / Self Care | Payer: 59 | Attending: Emergency Medicine | Admitting: Emergency Medicine

## 2020-11-15 ENCOUNTER — Other Ambulatory Visit: Payer: Self-pay

## 2020-11-15 DIAGNOSIS — R110 Nausea: Secondary | ICD-10-CM | POA: Insufficient documentation

## 2020-11-15 DIAGNOSIS — M549 Dorsalgia, unspecified: Secondary | ICD-10-CM | POA: Diagnosis not present

## 2020-11-15 DIAGNOSIS — R109 Unspecified abdominal pain: Secondary | ICD-10-CM | POA: Insufficient documentation

## 2020-11-15 DIAGNOSIS — K5900 Constipation, unspecified: Secondary | ICD-10-CM | POA: Insufficient documentation

## 2020-11-15 DIAGNOSIS — Z5321 Procedure and treatment not carried out due to patient leaving prior to being seen by health care provider: Secondary | ICD-10-CM | POA: Diagnosis not present

## 2020-11-15 LAB — COMPREHENSIVE METABOLIC PANEL
ALT: 15 U/L (ref 0–44)
AST: 17 U/L (ref 15–41)
Albumin: 4.8 g/dL (ref 3.5–5.0)
Alkaline Phosphatase: 45 U/L (ref 38–126)
Anion gap: 11 (ref 5–15)
BUN: 8 mg/dL (ref 6–20)
CO2: 28 mmol/L (ref 22–32)
Calcium: 9.8 mg/dL (ref 8.9–10.3)
Chloride: 102 mmol/L (ref 98–111)
Creatinine, Ser: 0.81 mg/dL (ref 0.61–1.24)
GFR, Estimated: >60 mL/min (ref >60)
Glucose, Bld: 89 mg/dL (ref 70–99)
Potassium: 4.1 mmol/L (ref 3.5–5.1)
Sodium: 141 mmol/L (ref 135–145)
Total Bilirubin: 0.7 mg/dL (ref 0.3–1.2)
Total Protein: 7.7 g/dL (ref 6.5–8.1)

## 2020-11-15 LAB — URINALYSIS, COMPLETE (UACMP) WITH MICROSCOPIC
Bacteria, UA: NONE SEEN
Bilirubin Urine: NEGATIVE
Glucose, UA: NEGATIVE mg/dL
Hgb urine dipstick: NEGATIVE
Ketones, ur: NEGATIVE mg/dL
Leukocytes,Ua: NEGATIVE
Nitrite: NEGATIVE
Protein, ur: NEGATIVE mg/dL
Specific Gravity, Urine: 1.012 (ref 1.005–1.030)
pH: 8 (ref 5.0–8.0)

## 2020-11-15 LAB — CBC
HCT: 42.7 % (ref 39.0–52.0)
Hemoglobin: 15.1 g/dL (ref 13.0–17.0)
MCH: 29.2 pg (ref 26.0–34.0)
MCHC: 35.4 g/dL (ref 30.0–36.0)
MCV: 82.4 fL (ref 80.0–100.0)
Platelets: 190 10*3/uL (ref 150–400)
RBC: 5.18 MIL/uL (ref 4.22–5.81)
RDW: 11.9 % (ref 11.5–15.5)
WBC: 5.8 10*3/uL (ref 4.0–10.5)
nRBC: 0 % (ref 0.0–0.2)

## 2020-11-15 LAB — LIPASE, BLOOD: Lipase: 25 U/L (ref 11–51)

## 2020-11-15 NOTE — ED Triage Notes (Signed)
Pt comes via POV from home with c/o abdominal pain. Pt also states some back pain from stiffness. Pt states he was constipated in past and this may be that too.  Pt states some nausea.  Pt states inward pain. Pt states contraction type pain.

## 2020-11-16 ENCOUNTER — Ambulatory Visit: Payer: 59 | Admitting: Student in an Organized Health Care Education/Training Program

## 2020-11-30 ENCOUNTER — Ambulatory Visit: Payer: 59 | Admitting: Nurse Practitioner

## 2020-12-07 ENCOUNTER — Ambulatory Visit: Payer: 59 | Admitting: Physician Assistant

## 2020-12-11 ENCOUNTER — Other Ambulatory Visit: Payer: Self-pay

## 2020-12-11 ENCOUNTER — Emergency Department: Payer: BC Managed Care – PPO

## 2020-12-11 ENCOUNTER — Emergency Department
Admission: EM | Admit: 2020-12-11 | Discharge: 2020-12-11 | Disposition: A | Discharge status: Home / Self Care | Payer: BC Managed Care – PPO | Attending: Emergency Medicine | Admitting: Emergency Medicine

## 2020-12-11 ENCOUNTER — Encounter: Payer: Self-pay | Admitting: Emergency Medicine

## 2020-12-11 DIAGNOSIS — M7918 Myalgia, other site: Secondary | ICD-10-CM

## 2020-12-11 DIAGNOSIS — M25561 Pain in right knee: Secondary | ICD-10-CM | POA: Insufficient documentation

## 2020-12-11 DIAGNOSIS — Z79899 Other long term (current) drug therapy: Secondary | ICD-10-CM | POA: Insufficient documentation

## 2020-12-11 DIAGNOSIS — G8929 Other chronic pain: Secondary | ICD-10-CM

## 2020-12-11 DIAGNOSIS — M25571 Pain in right ankle and joints of right foot: Secondary | ICD-10-CM | POA: Insufficient documentation

## 2020-12-11 DIAGNOSIS — M542 Cervicalgia: Secondary | ICD-10-CM | POA: Insufficient documentation

## 2020-12-11 DIAGNOSIS — Z91010 Allergy to peanuts: Secondary | ICD-10-CM | POA: Insufficient documentation

## 2020-12-11 DIAGNOSIS — M791 Myalgia, unspecified site: Secondary | ICD-10-CM | POA: Diagnosis not present

## 2020-12-11 DIAGNOSIS — M25551 Pain in right hip: Secondary | ICD-10-CM | POA: Diagnosis present

## 2020-12-11 DIAGNOSIS — R519 Headache, unspecified: Secondary | ICD-10-CM | POA: Insufficient documentation

## 2020-12-11 LAB — BASIC METABOLIC PANEL
Anion gap: 9 (ref 5–15)
BUN: 8 mg/dL (ref 6–20)
CO2: 27 mmol/L (ref 22–32)
Calcium: 9.6 mg/dL (ref 8.9–10.3)
Chloride: 103 mmol/L (ref 98–111)
Creatinine, Ser: 0.67 mg/dL (ref 0.61–1.24)
GFR, Estimated: >60 mL/min (ref >60)
Glucose, Bld: 101 mg/dL — ABNORMAL HIGH (ref 70–99)
Potassium: 4.2 mmol/L (ref 3.5–5.1)
Sodium: 139 mmol/L (ref 135–145)

## 2020-12-11 LAB — CBC WITH DIFFERENTIAL/PLATELET
Abs Immature Granulocytes: 0 10*3/uL (ref 0.00–0.07)
Basophils Absolute: 0 10*3/uL (ref 0.0–0.1)
Basophils Relative: 1 %
Eosinophils Absolute: 0 10*3/uL (ref 0.0–0.5)
Eosinophils Relative: 0 %
HCT: 43.3 % (ref 39.0–52.0)
Hemoglobin: 15.1 g/dL (ref 13.0–17.0)
Immature Granulocytes: 0 %
Lymphocytes Relative: 35 %
Lymphs Abs: 1.1 10*3/uL (ref 0.7–4.0)
MCH: 29 pg (ref 26.0–34.0)
MCHC: 34.9 g/dL (ref 30.0–36.0)
MCV: 83.3 fL (ref 80.0–100.0)
Monocytes Absolute: 0.3 10*3/uL (ref 0.1–1.0)
Monocytes Relative: 9 %
Neutro Abs: 1.8 10*3/uL (ref 1.7–7.7)
Neutrophils Relative %: 55 %
Platelets: 176 10*3/uL (ref 150–400)
RBC: 5.2 MIL/uL (ref 4.22–5.81)
RDW: 11.7 % (ref 11.5–15.5)
WBC: 3.2 10*3/uL — ABNORMAL LOW (ref 4.0–10.5)
nRBC: 0 % (ref 0.0–0.2)

## 2020-12-11 LAB — SEDIMENTATION RATE: Sed Rate: 2 mm/hr (ref 0–15)

## 2020-12-11 NOTE — ED Triage Notes (Signed)
Presents with head and neck pain  States he had a fall about 10 months ago    Has been having pain since  Has been seen by neuro and placed on meds  Denies any new or recent injury   Also having pain to right hip and leg  States he is scheduled to have surgery in about 3 weeks  States pain is moving into right knee and ankle  Ambulates with limp

## 2020-12-11 NOTE — Discharge Instructions (Addendum)
Keep your appointment next week for further evaluation of your chronic pain.  Continue with your regular medications.  The results of your rheumatoid and ANA can be seen on MyChart and also your doctor can see this information as well.  X-rays of your knee and ankle were negative for osteoarthritis.

## 2020-12-11 NOTE — ED Provider Notes (Signed)
[facility] Emergency Department Provider Note   ____________________________________________   Event Date/Time   First MD Initiated Contact with Patient 12/11/20 0900     (approximate)  I have reviewed the triage vital signs and the nursing notes.   HISTORY  Chief Complaint Neck Pain and Hip Pain   HPI Nicholas Johnson. is a 30 y.o. male presents to the ED with multiple complaints.  Patient states he fell approximately 10 months ago has been having pain since.  He continues to complain of pain to his right hip, right knee, right ankle.  He also has had pain to his neck and headaches which originate posterior scalp.  Patient had a telemedicine visit with neurology at [facility] 2 days ago and is not satisfied with their plan.  Patient has had ankle x-rayed once in the past year, his knee has been x-rayed twice before today.  He has also had a CT of his cervical spine x2, head, right knee within the last year.  Patient reports that he is also had an MRI of his right knee and lower back.  He reports that he is scheduled for surgery on his right hip for the middle of March.  He denies any new injuries and that the complaints he has today is ongoing.  He reports having hydrocodone, Lyrica, Soma and naproxen.  Patient states that the naproxen has caused stomach upset.  Currently he rates pain as 6 out of 10.       Past Medical History:  Diagnosis Date  . Nasal congestion   . Parotid gland pain   . Psychosis [facility])    admitted 12/15    Patient Active Problem List   Diagnosis Date Noted  . Right leg weakness 07/20/2020  . Injury of left leg 07/09/2020  . Low back pain 06/10/2020  . Pain in joint of right hip 06/10/2020  . 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 Altered mental status   . Psychoses (HCC)   . Parotid gland pain   . Nasal congestion     Past Surgical History:  Procedure Laterality Date  . WISDOM TOOTH EXTRACTION     25     Prior to Admission  medications   Medication Sig Start Date End Date Taking? Authorizing Provider  carisoprodol (SOMA) 350 MG tablet Take 1 tablet by mouth in the morning, at noon, and at bedtime. 09/20/20   [provider]  HYDROcodone-acetaminophen (NORCO/VICODIN) 5-325 MG tablet Take 1 tablet by mouth every 6 (six) hours as needed for moderate pain.    [provider]  METAMUCIL FIBER PO Take 1 Dose by mouth as needed.    [provider]  polyethylene glycol powder (GLYCOLAX/MIRALAX) 17 GM/SCOOP powder Take 17 g by mouth as needed. Patient not taking: No sig reported    [provider]  pregabalin (LYRICA) 25 MG capsule Take 25 mg by mouth 2 (two) times daily.    [provider]  Vitamin D, Ergocalciferol, (DRISDOL) 1.25 MG (50000 UNIT) CAPS capsule Take 50,000 Units by mouth once a week. 09/18/20   [provider]    Allergies Peanut-containing drug products and Wheat bran  Family History  Problem Relation Age of Onset  . Colon cancer Neg Hx   . Esophageal cancer Neg Hx   . Pancreatic cancer Neg Hx   . Stomach cancer Neg Hx   . Liver disease Neg Hx     Social History Social History   Tobacco Use  .  Smoking status: Never Smoker  . Smokeless tobacco: Never Used  Vaping Use  . Vaping Use: Never used  Substance Use Topics  . Alcohol use: No  . Drug use: Not Currently    Types: Marijuana    Comment: several years ago as of 01/07/20    Review of Systems Constitutional: No fever/chills Eyes: No visual changes. ENT: No sore throat. Cardiovascular: Denies chest pain. Respiratory: Denies shortness of breath.  Negative for cough. Gastrointestinal: No abdominal pain.  No nausea, no vomiting.  No diarrhea.  No constipation. Genitourinary: Negative for dysuria. Musculoskeletal: Positive for chronic pain right hip, right knee, right ankle and cervical spine. Skin: Negative for rash. Neurological: Positive for chronic  headaches, negative for focal  weakness or numbness.  ____________________________________________   PHYSICAL EXAM:  VITAL SIGNS: ED Triage Vitals  Enc Vitals Group     BP 12/11/20 0908 127/84     Pulse Rate 12/11/20 0908 70     Resp 12/11/20 0908 18     Temp 12/11/20 0908 98.7 F (37.1 C)     Temp Source 12/11/20 0908 Oral     SpO2 12/11/20 0908 100 %     Weight 12/11/20 0900 130 lb (59 kg)     Height 12/11/20 0900 5\' 7"  (1.702 m)     Head Circumference --      Peak Flow --      Pain Score 12/11/20 0859 6     Pain Loc --      Pain Edu? --      Excl. in GC? --     Constitutional: Alert and oriented. Well appearing and in no acute distress. Eyes: Conjunctivae are normal. PERRL. EOMI. Head: Atraumatic.  No trauma is noted.  Patient does complain with palpation of the posterior scalp.  No discoloration, papules or soft tissue edema present. Neck: No stridor.  Range of motion is without restriction x4 planes.  No skin discoloration or soft tissue edema.  There is minimal tenderness on palpation of cervical spine posteriorly. Cardiovascular: Normal rate, regular rhythm. Grossly normal heart sounds.  Good peripheral circulation. Respiratory: Normal respiratory effort.  No retractions. Lungs CTAB. Gastrointestinal: Soft and nontender. No distention.  No CVA tenderness. Musculoskeletal: On examination of lower lumbar area there is no gross deformity and minimal tenderness on palpation of the lumbar spine.  Right hip without gross deformity.  Patient is able to abduct and abduct and no crepitus is noted.  No effusion noted to the right knee, erythema or soft tissue edema.  No crepitus is appreciated with range of motion.  On examination of the right ankle there is no soft tissue edema present.  Patient is able to flex and extend without difficulty.  Patient is ambulatory without any assistance and is noted to be walking in the hallway frequently while waiting for his test results. Neurologic:  Normal speech and language.  No gross focal neurologic deficits are appreciated. No gait instability. Skin:  Skin is warm, dry and intact. No rash noted. Psychiatric: Mood and affect are normal. Speech and behavior are normal.  ____________________________________________   LABS (all labs ordered are listed, but only abnormal results are displayed)  Labs Reviewed  CBC WITH DIFFERENTIAL/PLATELET - Abnormal; Notable for the following components:      Result Value   WBC 3.2 (*)    All other components within normal limits  BASIC METABOLIC PANEL - Abnormal; Notable for the following components:   Glucose, Bld 101 (*)    All  other components within normal limits  SEDIMENTATION RATE  ANTINUCLEAR ANTIBODIES, IFA  RHEUMATOID FACTOR    RADIOLOGY I, Tommi Rumps, personally viewed and evaluated these images (plain radiographs) as part of my medical decision making, as well as reviewing the written report by the radiologist.   Official radiology report(s): DG Ankle Complete Right  Result Date: 12/11/2020 CLINICAL DATA:  Acute RIGHT ankle pain. No known injury. Initial encounter. EXAM: RIGHT ANKLE - COMPLETE 3+ VIEW COMPARISON:  None. FINDINGS: There is no evidence of fracture, dislocation, or joint effusion. There is no evidence of arthropathy or other focal bone abnormality. Soft tissues are unremarkable. IMPRESSION: Negative. Electronically Signed   By: Harmon Pier M.D.   On: 12/11/2020 10:46   DG Knee Complete 4 Views Right  Result Date: 12/11/2020 CLINICAL DATA:  Acute RIGHT knee pain. No known injury. Initial encounter. EXAM: RIGHT KNEE - COMPLETE 4+ VIEW COMPARISON:  None. FINDINGS: No evidence of fracture, dislocation, or joint effusion. No evidence of arthropathy or other focal bone abnormality. Soft tissues are unremarkable. IMPRESSION: Negative. Electronically Signed   By: Harmon Pier M.D.   On: 12/11/2020 10:45    ____________________________________________   PROCEDURES  Procedure(s) performed  (including Critical Care):  Procedures   ____________________________________________   INITIAL IMPRESSION / ASSESSMENT AND PLAN / ED COURSE  As part of my medical decision making, I reviewed the following data within the electronic MEDICAL RECORD NUMBER Notes from prior ED visits and [place] Controlled Substance Database  30 year old male presents to the ED with multiple complaints that are chronic in nature and he is under the care of a neurologist and orthopedist.  He states the next week he will be seeing someone in the pain clinic.  He complains of chronic headaches, chronic right hip pain, right knee pain and right ankle pain.  X-rays of these areas have been performed more than once with negative results however he states that on his MRI of his right hip he was told he had a tear in his labrum for which he is scheduled for surgery in March.  Patient was offered an injection of Toradol which he declined stating that he has had that in the past and has not helped.  Patient will continue with his medications that have been prescribed including Soma, Lyrica, and Norco as needed.  Patient is encouraged to keep his appointment with the pain clinic next week.  No other medications were prescribed at this time.  At the time of discharge patient was aware that his ANA and rheumatoid were still pending. ____________________________________________   FINAL CLINICAL IMPRESSION(S) / ED DIAGNOSES  Final diagnoses:  Musculoskeletal pain, chronic     ED Discharge Orders    None      *Please note:  Nicholas Johnson. was evaluated in Emergency Department on 12/11/2020 for the symptoms described in the history of present illness. He was evaluated in the context of the global COVID-19 pandemic, which necessitated consideration that the patient might be at risk for infection with the SARS-CoV-2 virus that causes COVID-19. Institutional protocols and algorithms that pertain to the evaluation of patients at risk  for COVID-19 are in a state of rapid change based on information released by regulatory bodies including the CDC and federal and state organizations. These policies and algorithms were followed during the patient's care in the ED.  Some ED evaluations and interventions may be delayed as a result of limited staffing during and the pandemic.*   Note:  This document was prepared using Dragon voice recognition software and may include unintentional dictation errors.    Tommi RumpsSummers, Lissett Favorite L, PA-C 12/11/20 1247    Jene EveryKinner, Robert, MD 12/11/20 1249

## 2020-12-14 ENCOUNTER — Ambulatory Visit (HOSPITAL_BASED_OUTPATIENT_CLINIC_OR_DEPARTMENT_OTHER): Payer: BC Managed Care – PPO | Admitting: Student in an Organized Health Care Education/Training Program

## 2020-12-14 ENCOUNTER — Ambulatory Visit
Payer: BC Managed Care – PPO | Attending: Student in an Organized Health Care Education/Training Program | Admitting: Student in an Organized Health Care Education/Training Program

## 2020-12-14 ENCOUNTER — Other Ambulatory Visit: Payer: Self-pay

## 2020-12-14 ENCOUNTER — Telehealth: Payer: Self-pay | Admitting: *Deleted

## 2020-12-14 DIAGNOSIS — S73191D Other sprain of right hip, subsequent encounter: Secondary | ICD-10-CM

## 2020-12-14 LAB — RHEUMATOID FACTOR: Rheumatoid fact SerPl-aCnc: <10 IU/mL (ref <14.0)

## 2020-12-14 LAB — ANTINUCLEAR ANTIBODIES, IFA: ANA Ab, IFA: NEGATIVE

## 2020-12-14 NOTE — Telephone Encounter (Signed)
Contacted patient as instructed by Dr. Cherylann Ratel to ask him what he is requesting to speak with Dr. Cherylann Ratel about at his appt today. Dr. Cherylann Ratel has previously discussed with him in detail that there is nothing to offer him that has not already been offered. Patient insisted that he speak with Dr. Cherylann Ratel to discuss more options for treatment of his neck pain. I reiterated to Nicholas Johnson that an appt is not needed. He persisted, repeating the same statement many times that he wants to speak with Dr. Cherylann Ratel about treatment for his neck pain. After several repeats of this discussion, I told [name] that the conversation was over and that I would hang up the phone. He began to repeat this again, saying he was going to come here anyway, and he was going to report up to administration. I then hung up the phone.

## 2020-12-15 NOTE — Progress Notes (Signed)
F/U with ortho for R labrum tear, neurology for migraines/headaches

## 2020-12-23 ENCOUNTER — Ambulatory Visit: Payer: 59 | Admitting: Nurse Practitioner

## 2020-12-31 ENCOUNTER — Other Ambulatory Visit: Payer: Self-pay

## 2020-12-31 ENCOUNTER — Encounter: Payer: Self-pay | Admitting: Nurse Practitioner

## 2020-12-31 ENCOUNTER — Ambulatory Visit (INDEPENDENT_AMBULATORY_CARE_PROVIDER_SITE_OTHER): Payer: BC Managed Care – PPO | Admitting: Nurse Practitioner

## 2020-12-31 VITALS — BP 118/64 | HR 74 | Ht 68.0 in | Wt 128.0 lb

## 2020-12-31 DIAGNOSIS — R6881 Early satiety: Secondary | ICD-10-CM | POA: Diagnosis not present

## 2020-12-31 DIAGNOSIS — R1310 Dysphagia, unspecified: Secondary | ICD-10-CM

## 2020-12-31 DIAGNOSIS — K5900 Constipation, unspecified: Secondary | ICD-10-CM | POA: Diagnosis not present

## 2020-12-31 MED ORDER — HYOSCYAMINE SULFATE 0.125 MG SL SUBL: 0.1250 mg | SUBLINGUAL_TABLET | Freq: Four times a day (QID) | SUBLINGUAL | 0 refills | Status: DC | PRN

## 2020-12-31 NOTE — Patient Instructions (Addendum)
If you are age 29 or younger, your body mass index should be between 19-25. Your Body mass index is 19.46 kg/m. If this is out of the aformentioned range listed, please consider follow up with your Primary Care Provider.   It has been recommended to you by your physician that you have a(n) EGD completed. Per your request, we did not schedule the procedure(s) today. Please contact our office at [phone] should you decide to have the procedure completed. You will be scheduled for a pre-visit and procedure at that time.  MEDICATION: We have sent the following medication to your pharmacy for you to pick up at your convenience: Hyoscyamine 0.125 MG sublingual tablet, place one under the tongue every 6-8 hours as needed for abdominal pain.  OVER THE COUNTER MEDICATION Please purchase the following medications over the counter and take as directed:  Miralax- Dissolve one capful in 8 ounces of water and drink before bed.  It was great seeing you today! Thank you for entrusting me with your care and choosing [facility].  Arnaldo Natal, CRNP

## 2020-12-31 NOTE — Progress Notes (Signed)
12/31/2020 Nicholas Johnson [id number] [birth year]   Chief Complaint: Feels full easily, tightness in throat when swallowing   History of Present Illness:  Nicholas Johnson. Zou with a past medical history of anxiety, nausea, GERD symptoms, chronic generalized abdominal pain and constipation. Covid 19 + 11/20202.  He 10 or 11 months ago which resulted in a right hip injury with chronic pain. He is scheduled for right hip arthroscopy on 01/05/2021. He presents to our office today for further evaluation regarding early satiety and dysphagia. He feels full after eating a few bites. He often feels hungry but cannot eat a full meal. He has some nausea. No vomiting. No heartburn. He complains of difficulty swallowing, possibly requires more effort to swallow but food does not get stuck in the throat or esophagus. He feels tightness in his throat, more on the right side than left which he feels makes it harder to swallow. No odynophagia. No respiratory distress. He underwent a swallow study with speech pathologist 08/05/2020 without evidence of oropharyngeal dysphagia or neuromuscular deficits or esophageal dysmotility. He has intermittent epigastric pain, not sure if it is worse after eating. He has difficulty burping, feels gas in esophagus is trapped at times and can't burp. He has chest wall pain and he underwent a chest MRI which was unrevealing. He continue to have constipation issues, he is not taking Miralax. He is taking hydrocodone for neck and right hip pain 2 to 3 times weekly. He is no longer taking Naproxen.  He is followed by neurology at [facility] for neck pain.   CBC Latest Ref Rng & Units 12/11/2020 11/15/2020 10/14/2019  WBC 4.0 - 10.5 K/uL 3.2(L) 5.8 5.5  Hemoglobin 13.0 - 17.0 g/dL 19.7 58.8 32.5  Hematocrit 39.0 - 52.0 % 43.3 42.7 44.9  Platelets 150 - 400 K/uL 176 190 191    CMP Latest Ref Rng & Units 12/11/2020 11/15/2020 10/14/2019  Glucose 70 - 99 mg/dL 498(Y) 89 83  BUN 6 - 20 mg/dL 8 8  14   Creatinine 0.61 - 1.24 mg/dL 6.41 5.83  Sodium 135 - 145 mmol/L 139 141 141  Potassium 3.5 - 5.1 mmol/L 4.2 4.1 4.4  Chloride 98 - 111 mmol/L 103 102 103  CO2 22 - 32 mmol/L 27 28 28   Calcium 8.9 - 10.3 mg/dL 9.6 9.8 9.9  Total Protein 6.5 - 8.1 g/dL - 7.7 7.7  Total Bilirubin 0.3 - 1.2 mg/dL - 0.7 0.3  Alkaline Phos 38 - 126 U/L - 45 -  AST 15 - 41 U/L - 17 17  ALT 0 - 44 U/L - 15 10   CT cervical spine 09/08/2020: Negative. No acute traumatic injury identified in the cervical Spine.  Swallow study with speech pathologist 08/05/2020: No evidence of oropharyngeal dysphagia  CT soft tissue neck 06/30/2020: 1. Normal CT appearance of the Neck. No explanation for globus sensation. 2. Mild cervicothoracic scoliosis.  CTAP 10/08/2019: Stomach/Bowel: Ingested enteric contrast extends to the level the rectum. Large colonic stool burden without evidence of enteric obstruction. Normal appearance of the terminal ileum and retrocecal appendix. No hiatal hernia. No discrete areas of bowel wall thickening. No pneumoperitoneum, pneumatosis or portal venous gas. IMPRESSION: 1. Large colonic stool burden without evidence of enteric obstruction. 2. Otherwise, no explanation for patient's diffuse abdominal pain for the past several months.   Current Medications, Allergies, Past Medical History, Past Surgical History, Family History and Social History were reviewed in 08/30/2020 record.  Current  Outpatient Medications on File Prior to Visit  Medication Sig Dispense Refill  . HYDROcodone-acetaminophen (NORCO/VICODIN) 5-325 MG tablet Take 1 tablet by mouth every 6 (six) hours as needed for moderate pain.     No current facility-administered medications on file prior to visit.   Allergies  Allergen Reactions  . Peanut-Containing Drug Products   . Wheat Bran    Review of Systems:   Constitutional: Negative for fever, sweats, chills or weight loss.   Respiratory: Negative for shortness of breath.   Cardiovascular: + chest wall pain. No  palpitations and leg swelling.  Gastrointestinal: See HPI.  Musculoskeletal: See HPI.  Neurological: Negative for dizziness, headaches or paresthesias.   Physical Exam: BP 118/64   Pulse 74   Ht 5\' 8"  (1.727 m)   Wt 128 lb (58.1 kg)   SpO2 98%   BMI 19.46 kg/m   Wt Readings from Last 3 Encounters:  12/31/20 128 lb (58.1 kg)  12/11/20 130 lb (59 kg)  11/15/20 125 lb (56.7 kg)   General: 30 year old male in no acute distress. Head: Normocephalic and atraumatic. Eyes: No scleral icterus. Conjunctiva pink . Ears: Normal auditory acuity. Mouth: Dentition intact. No ulcers or lesions.  Lungs: Clear throughout to auscultation. Heart: Regular rate and rhythm, no murmur. Abdomen: Soft, nontender and nondistended. No masses or hepatomegaly. Normal bowel sounds x 4 quadrants.  Chest: Chest wall nontender. No deformities.  Rectal: Deferred.  Musculoskeletal: Symmetrical with no gross deformities. Extremities: No edema. Neurological: Alert oriented x 4. No focal deficits.  Psychological: Alert and cooperative. Normal mood and affect  Assessment and Recommendations:  62. 30 year old male with early satiety, throat tightness sensation with questionable dysphagia component. Difficulty burping. Intermittent epigastric pain. Swallow study by speech pathology was unrevealing.  -EGD benefits and risks discussed including risk with sedation, risk of bleeding, perforation and infection  -Patient wishes to schedule EGD after he recovers from his right hip surgery  -Hyoscyamine 0.125mg  one tab SL tid for abdominal pain as needed -Famotidine 20mg  one po QD OTC -Patient to call office if symptoms worsen -Discussed trial with Buspirone for globus sensation if EGD negtive -Recommend ENT evaluation  2. Chronic constipation, Opioid use most likely contributing  -Miralax   3. Mild Leukopenia -Follow up with  PCP, repeat CBC with next lab draw   4. Right hip injury with chronic pain. He is scheduled for right hip arthroscopy on 01/05/2021.

## 2021-01-02 NOTE — Progress Notes (Signed)
Reviewed and agree with management plan.  Jahziah Simonin T. Sade Hollon, MD FACG [phone]  

## 2021-01-13 ENCOUNTER — Emergency Department (HOSPITAL_COMMUNITY)
Admission: EM | Admit: 2021-01-13 | Discharge: 2021-01-13 | Disposition: A | Discharge status: Home / Self Care | Payer: BC Managed Care – PPO | Attending: Emergency Medicine | Admitting: Emergency Medicine

## 2021-01-13 ENCOUNTER — Encounter (HOSPITAL_COMMUNITY): Payer: Self-pay

## 2021-01-13 ENCOUNTER — Other Ambulatory Visit: Payer: Self-pay

## 2021-01-13 DIAGNOSIS — R109 Unspecified abdominal pain: Secondary | ICD-10-CM | POA: Diagnosis not present

## 2021-01-13 DIAGNOSIS — R519 Headache, unspecified: Secondary | ICD-10-CM | POA: Diagnosis not present

## 2021-01-13 DIAGNOSIS — R110 Nausea: Secondary | ICD-10-CM | POA: Diagnosis present

## 2021-01-13 DIAGNOSIS — G8929 Other chronic pain: Secondary | ICD-10-CM | POA: Diagnosis not present

## 2021-01-13 DIAGNOSIS — Z91010 Allergy to peanuts: Secondary | ICD-10-CM | POA: Insufficient documentation

## 2021-01-13 DIAGNOSIS — H539 Unspecified visual disturbance: Secondary | ICD-10-CM | POA: Insufficient documentation

## 2021-01-13 LAB — CBC WITH DIFFERENTIAL/PLATELET
Abs Immature Granulocytes: 0.01 10*3/uL (ref 0.00–0.07)
Basophils Absolute: 0 10*3/uL (ref 0.0–0.1)
Basophils Relative: 0 %
Eosinophils Absolute: 0 10*3/uL (ref 0.0–0.5)
Eosinophils Relative: 0 %
HCT: 43.6 % (ref 39.0–52.0)
Hemoglobin: 15 g/dL (ref 13.0–17.0)
Immature Granulocytes: 0 %
Lymphocytes Relative: 24 %
Lymphs Abs: 1.3 10*3/uL (ref 0.7–4.0)
MCH: 28.8 pg (ref 26.0–34.0)
MCHC: 34.4 g/dL (ref 30.0–36.0)
MCV: 83.7 fL (ref 80.0–100.0)
Monocytes Absolute: 0.3 10*3/uL (ref 0.1–1.0)
Monocytes Relative: 6 %
Neutro Abs: 3.7 10*3/uL (ref 1.7–7.7)
Neutrophils Relative %: 70 %
Platelets: 191 10*3/uL (ref 150–400)
RBC: 5.21 MIL/uL (ref 4.22–5.81)
RDW: 11.5 % (ref 11.5–15.5)
WBC: 5.3 10*3/uL (ref 4.0–10.5)
nRBC: 0 % (ref 0.0–0.2)

## 2021-01-13 LAB — COMPREHENSIVE METABOLIC PANEL
ALT: 13 U/L (ref 0–44)
AST: 18 U/L (ref 15–41)
Albumin: 4.9 g/dL (ref 3.5–5.0)
Alkaline Phosphatase: 41 U/L (ref 38–126)
Anion gap: 7 (ref 5–15)
BUN: 9 mg/dL (ref 6–20)
CO2: 27 mmol/L (ref 22–32)
Calcium: 9.6 mg/dL (ref 8.9–10.3)
Chloride: 105 mmol/L (ref 98–111)
Creatinine, Ser: 0.77 mg/dL (ref 0.61–1.24)
GFR, Estimated: >60 mL/min (ref >60)
Glucose, Bld: 100 mg/dL — ABNORMAL HIGH (ref 70–99)
Potassium: 3.9 mmol/L (ref 3.5–5.1)
Sodium: 139 mmol/L (ref 135–145)
Total Bilirubin: 0.6 mg/dL (ref 0.3–1.2)
Total Protein: 7.7 g/dL (ref 6.5–8.1)

## 2021-01-13 LAB — LIPASE, BLOOD: Lipase: 26 U/L (ref 11–51)

## 2021-01-13 MED ORDER — SODIUM CHLORIDE 0.9 % IV BOLUS: 1000.0000 mL | Freq: Once | INTRAVENOUS | Status: DC

## 2021-01-13 MED ORDER — ONDANSETRON 4 MG PO TBDP: 4.0000 mg | ORAL_TABLET | Freq: Three times a day (TID) | ORAL | 0 refills | Status: DC | PRN

## 2021-01-13 MED ORDER — DIPHENHYDRAMINE HCL 50 MG/ML IJ SOLN
12.5000 mg | Freq: Once | INTRAMUSCULAR | Status: DC
  Filled 2021-01-13: qty 1

## 2021-01-13 MED ORDER — ONDANSETRON 4 MG PO TBDP
4.0000 mg | ORAL_TABLET | Freq: Once | ORAL | Status: AC
  Administered 2021-01-13: 4 mg via ORAL
  Filled 2021-01-13: qty 1

## 2021-01-13 MED ORDER — METOCLOPRAMIDE HCL 5 MG/ML IJ SOLN
10.0000 mg | Freq: Once | INTRAMUSCULAR | Status: DC
  Filled 2021-01-13: qty 2

## 2021-01-13 NOTE — Discharge Instructions (Addendum)
You can take the Zofran as needed to help with your nausea. He will need to follow-up with your GI doctor for further evaluation of your abdominal pain. Neurological exam today was reassuring without any abnormalities. Follow-up with your neurologist. You can continue taking Tylenol or ibuprofen to help with your symptoms. Make sure you do not take these on an empty stomach. Return to the ER if you start to experience severe headache, worsening blurry vision, uncontrollable vomiting despite taking the medications or bloody stools.

## 2021-01-13 NOTE — ED Triage Notes (Signed)
Pt c/o a generalized uneasy feeling. Describes it as feeling nauseated. Also reports HA and R sided pain. Pt has very nonspecific complaints, denies fevers, urinary symptoms

## 2021-01-13 NOTE — ED Provider Notes (Signed)
[name]-EMERGENCY DEPT Provider Note   CSN: [id number] Arrival date & time: 01/13/21  1944     History Chief Complaint  Patient presents with  . Nausea    Nicholas Overalleter J Marinos Jr. is a 30 y.o. male with a past medical history of psychosis, left leg injury presenting to the ED for multiple complaints. #1 his biggest complaint is headache.  States that Nicholas Johnson fell off of his truck about 11 months ago and since then has been having intermittent headaches radiating down his neck.  Reports associated nausea.  Has been having floaters in his eyes for several months as well.  Nicholas Johnson has seen a neurologist and a "neuro-ophthalmologist" and was told that Nicholas Johnson could have "some type of nerve problem."  Nicholas Johnson has had both a CT of his head as well as an MRI that have been normal since the symptoms began.  Nicholas Johnson states that "I just want a scan of my entire back just to look at all my nerves and muscles." No numbness or weakness reported. #2 Nicholas Johnson also reports nausea and right-sided abdominal pain.  States that this is also been chronic for several months.  However when asked why Nicholas Johnson came to the ER today Nicholas Johnson states that "I just could not take the nausea anymore."  Nicholas Johnson did see his GI doctor in the past and was offered to do an endoscopy which Nicholas Johnson states that "I guess I should get that done so they can see what is going on."  Nicholas Johnson believes that Nicholas Johnson was given an ODT medication but has not taken it but states "I guess I should take that."  Denies any changes to bowel movements but was told in the past that Nicholas Johnson was constipated.  No urinary symptoms.  No numbness in arms or legs. Nicholas Johnson also complains of pain associated with his right hip labrum tear, pain throughout his body and general stress associated with his upcoming labrum surgery as well as chronic pain.  HPI     Past Medical History:  Diagnosis Date  . Nasal congestion   . Parotid gland pain   . Psychosis [facility](HCC)    admitted 12/15    Patient Active Problem List    Diagnosis Date Noted  . Right leg weakness 07/20/2020  . Injury of left leg 07/09/2020  . Low back pain 06/10/2020  . Pain in joint of right hip 06/10/2020  . 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 Altered mental status   . Psychoses (HCC)   . Parotid gland pain   . Nasal congestion     Past Surgical History:  Procedure Laterality Date  . WISDOM TOOTH EXTRACTION     25        Family History  Problem Relation Age of Onset  . Colon cancer Neg Hx   . Esophageal cancer Neg Hx   . Pancreatic cancer Neg Hx   . Stomach cancer Neg Hx   . Liver disease Neg Hx     Social History   Tobacco Use  . Smoking status: Never Smoker  . Smokeless tobacco: Never Used  Vaping Use  . Vaping Use: Never used  Substance Use Topics  . Alcohol use: No  . Drug use: Not Currently    Types: Marijuana    Comment: several years ago as of 01/07/20    Home Medications Prior to Admission medications   Medication Sig Start Date End Date Taking? Authorizing Provider  ondansetron (ZOFRAN ODT) 4 MG disintegrating tablet Take 1 tablet (  4 mg total) by mouth every 8 (eight) hours as needed for nausea or vomiting. 01/13/21  Yes Khatri, Hina, PA-C  HYDROcodone-acetaminophen (NORCO/VICODIN) 5-325 MG tablet Take 1 tablet by mouth every 6 (six) hours as needed for moderate pain.    [provider]  hyoscyamine (LEVSIN SL) 0.125 MG SL tablet Place 1 tablet (0.125 mg total) under the tongue every 6 (six) hours as needed. 12/31/20   Arnaldo Natal, NP    Allergies    Peanut-containing drug products and Wheat bran  Review of Systems   Review of Systems  Constitutional: Negative for appetite change, chills and fever.  HENT: Negative for ear pain, rhinorrhea, sneezing and sore throat.   Eyes: Positive for visual disturbance (x11 months). Negative for photophobia.  Respiratory: Negative for cough, chest tightness, shortness of breath and wheezing.   Cardiovascular: Negative for chest pain and  palpitations.  Gastrointestinal: Positive for abdominal pain (x11 months) and nausea (x11 months). Negative for blood in stool, constipation, diarrhea and vomiting.  Genitourinary: Negative for dysuria, hematuria and urgency.  Musculoskeletal: Positive for myalgias (x11 months).  Skin: Negative for rash.  Neurological: Positive for headaches (x11 months). Negative for dizziness, weakness and light-headedness.    Physical Exam Updated Vital Signs BP (!) 158/83 (BP Location: Right Arm)   Pulse 85   Temp 98.8 F (37.1 C) (Oral)   Resp 17   Ht 5\' 8"  (1.727 m)   Wt 59 kg   SpO2 100%   BMI 19.77 kg/m   Physical Exam Vitals and nursing note reviewed.  Constitutional:      General: Nicholas Johnson is not in acute distress.    Appearance: Nicholas Johnson is well-developed.  HENT:     Head: Normocephalic and atraumatic.     Nose: Nose normal.  Eyes:     General: No scleral icterus.       Left eye: No discharge.     Conjunctiva/sclera: Conjunctivae normal.  Cardiovascular:     Rate and Rhythm: Normal rate and regular rhythm.     Heart sounds: Normal heart sounds. No murmur heard. No friction rub. No gallop.   Pulmonary:     Effort: Pulmonary effort is normal. No respiratory distress.     Breath sounds: Normal breath sounds.  Abdominal:     General: Bowel sounds are normal. There is no distension.     Palpations: Abdomen is soft.     Tenderness: There is no abdominal tenderness. There is no guarding.     Comments: Abdomen is soft, nontender nondistended.  Musculoskeletal:        General: Normal range of motion.     Cervical back: Normal range of motion and neck supple.     Comments: Moving all extremities without difficulty.  Skin:    General: Skin is warm and dry.     Findings: No rash.  Neurological:     General: No focal deficit present.     Mental Status: Nicholas Johnson is alert and oriented to person, place, and time. Mental status is at baseline.     Cranial Nerves: No cranial nerve deficit.     Sensory:  No sensory deficit.     Motor: No weakness or abnormal muscle tone.     Coordination: Coordination normal.     Comments: Pupils reactive. No facial asymmetry noted. Cranial nerves appear grossly intact. Sensation intact to light touch on face, BUE and BLE. Strength 5/5 in BUE and BLE.      ED Results / Procedures /  Treatments   Labs (all labs ordered are listed, but only abnormal results are displayed) Labs Reviewed  COMPREHENSIVE METABOLIC PANEL - Abnormal; Notable for the following components:      Result Value   Glucose, Bld 100 (*)    All other components within normal limits  LIPASE, BLOOD  CBC WITH DIFFERENTIAL/PLATELET    EKG None  Radiology No results found.  Procedures Procedures   Medications Ordered in ED Medications  ondansetron (ZOFRAN-ODT) disintegrating tablet 4 mg (4 mg Oral Given 01/13/21 2241)    ED Course  I have reviewed the triage vital signs and the nursing notes.  Pertinent labs & imaging results that were available during my care of the patient were reviewed by me and considered in my medical decision making (see chart for details).  Clinical Course as of 01/13/21 2339  Thu Jan 13, 2021  2233 Patient declining IV medications. [HK]  2305 WBC: 5.3 [HK]  2338 AST: 18 [HK]  2338 ALT: 13 [HK]  2338 Alkaline Phosphatase: 41 [HK]  2338 Total Bilirubin: 0.6 [HK]  2338 Lipase: 26 [HK]    Clinical Course User Index [HK] Dietrich Pates, PA-C   MDM Rules/Calculators/A&P                          30 year old male presenting to the ED with multiple complaints.  States that Nicholas Johnson fell off of his truck in April of last year and since then has been having chronic intermittent headaches radiating to his neck, right-sided abdominal pain, nausea.  Nicholas Johnson is also been dealing with a right hip labrum tear for which Nicholas Johnson is getting surgery for next month.  Patient spent majority of the visit telling me about his chronic pain.  Nicholas Johnson has seen a GI doctor, neurologist and  ophthalmologist about all of his symptoms.  Nicholas Johnson has had floaters in his eyes ever since his injury last year.  Nicholas Johnson states that Nicholas Johnson was told Nicholas Johnson had an unremarkable CT as well as an MRI of his head since the fall.  His biggest complaint today is wanting medication for his headache as well as his nausea.  Nicholas Johnson is currently being treated by GI doctor and was offered an endoscopy.  Nicholas Johnson was given antiemetics but has not been taking these.  Nicholas Johnson states that his headache feels similar to priors.  States that Nicholas Johnson is stressed about his chronic pain.  On exam patient without any neurological deficits.  Abdomen is soft and nontender.  Nicholas Johnson is moving all extremities without difficulty.  Nicholas Johnson has not had any subsequent injury since the one last year.  Will obtain lab work and attempt to treat his symptoms.  I did inform him that we will not be able to treat all of his chronic pain symptoms at this time but reassured him that as Nicholas Johnson is seeing several specialist as well as his primary care provider soon, that Nicholas Johnson can speak to them about any further concerns beyond what is evaluated in the ER.  Lab work including CBC, CMP and lipase unremarkable.  On recheck patient reports improvement in nausea but continues to have headache.  I did offer IM and IV medications for headache but patient declines.  Nicholas Johnson states that "I just think my headache is what is causing my stomach to hurt because I feel like it is all connected."  I did tell him that the headache could cause worsening nausea which could cause worsening abdominal discomfort.  I continue  to offer additional medications but Nicholas Johnson declines.  I feel that Nicholas Johnson will benefit from follow-up with GI doctor as well as his neurologist. I do not feel that repeat head imaging is indicated at this time as his symptoms have been chronic since his injury 11 months ago and have been unchanged as well as no neurological deficits on today's exam.  In the meantime we will have him take Zofran as needed.  I told him to  continue Tylenol and Motrin as needed to help with his headache. Patient agrees to plan. Return precautions given.   Patient is hemodynamically stable, in NAD, and able to ambulate in the ED. Evaluation does not show pathology that would require ongoing emergent intervention or inpatient treatment. I explained the diagnosis to the patient. Pain has been managed and has no complaints prior to discharge. Patient is comfortable with above plan and is stable for discharge at this time. All questions were answered prior to disposition. Strict return precautions for returning to the ED were discussed. Encouraged follow up with PCP.   An After Visit Summary was printed and given to the patient.   Portions of this note were generated with Scientist, clinical (histocompatibility and immunogenetics). Dictation errors may occur despite best attempts at proofreading.  Final Clinical Impression(s) / ED Diagnoses Final diagnoses:  Nausea  Chronic daily headache    Rx / DC Orders ED Discharge Orders         Ordered    ondansetron (ZOFRAN ODT) 4 MG disintegrating tablet  Every 8 hours PRN        01/13/21 2314           Dietrich Pates, PA-C 01/13/21 2339    Linwood Dibbles, MD 01/14/21 [phone]

## 2021-01-18 ENCOUNTER — Other Ambulatory Visit: Payer: Self-pay | Admitting: Physician Assistant

## 2021-01-27 ENCOUNTER — Encounter (INDEPENDENT_AMBULATORY_CARE_PROVIDER_SITE_OTHER): Payer: BC Managed Care – PPO | Admitting: Ophthalmology

## 2021-01-28 ENCOUNTER — Encounter (INDEPENDENT_AMBULATORY_CARE_PROVIDER_SITE_OTHER): Payer: 59 | Admitting: Ophthalmology

## 2021-01-29 ENCOUNTER — Emergency Department (HOSPITAL_COMMUNITY): Payer: BC Managed Care – PPO

## 2021-01-29 ENCOUNTER — Emergency Department (HOSPITAL_COMMUNITY)
Admission: EM | Admit: 2021-01-29 | Discharge: 2021-01-30 | Disposition: A | Discharge status: Home / Self Care | Payer: BC Managed Care – PPO | Attending: Emergency Medicine | Admitting: Emergency Medicine

## 2021-01-29 DIAGNOSIS — S060X1A Concussion with loss of consciousness of 30 minutes or less, initial encounter: Secondary | ICD-10-CM | POA: Diagnosis not present

## 2021-01-29 DIAGNOSIS — M25512 Pain in left shoulder: Secondary | ICD-10-CM | POA: Diagnosis not present

## 2021-01-29 DIAGNOSIS — S0011XA Contusion of right eyelid and periocular area, initial encounter: Secondary | ICD-10-CM | POA: Diagnosis not present

## 2021-01-29 DIAGNOSIS — T1490XA Injury, unspecified, initial encounter: Secondary | ICD-10-CM

## 2021-01-29 DIAGNOSIS — Z91010 Allergy to peanuts: Secondary | ICD-10-CM | POA: Diagnosis not present

## 2021-01-29 DIAGNOSIS — R000 Tachycardia, unspecified: Secondary | ICD-10-CM | POA: Diagnosis not present

## 2021-01-29 DIAGNOSIS — M542 Cervicalgia: Secondary | ICD-10-CM | POA: Diagnosis not present

## 2021-01-29 DIAGNOSIS — R1084 Generalized abdominal pain: Secondary | ICD-10-CM | POA: Diagnosis not present

## 2021-01-29 DIAGNOSIS — S2232XA Fracture of one rib, left side, initial encounter for closed fracture: Secondary | ICD-10-CM | POA: Diagnosis not present

## 2021-01-29 DIAGNOSIS — S0511XA Contusion of eyeball and orbital tissues, right eye, initial encounter: Secondary | ICD-10-CM

## 2021-01-29 DIAGNOSIS — Y92410 Unspecified street and highway as the place of occurrence of the external cause: Secondary | ICD-10-CM | POA: Insufficient documentation

## 2021-01-29 DIAGNOSIS — R52 Pain, unspecified: Secondary | ICD-10-CM

## 2021-01-29 DIAGNOSIS — S069X9A Unspecified intracranial injury with loss of consciousness of unspecified duration, initial encounter: Secondary | ICD-10-CM

## 2021-01-29 DIAGNOSIS — S299XXA Unspecified injury of thorax, initial encounter: Secondary | ICD-10-CM | POA: Diagnosis present

## 2021-01-29 HISTORY — DX: Unspecified intracranial injury with loss of consciousness of unspecified duration, initial encounter: S06.9X9A

## 2021-01-29 LAB — COMPREHENSIVE METABOLIC PANEL
ALT: 23 U/L (ref 0–44)
AST: 34 U/L (ref 15–41)
Albumin: 4.9 g/dL (ref 3.5–5.0)
Alkaline Phosphatase: 48 U/L (ref 38–126)
Anion gap: 12 (ref 5–15)
BUN: 8 mg/dL (ref 6–20)
CO2: 26 mmol/L (ref 22–32)
Calcium: 9.7 mg/dL (ref 8.9–10.3)
Chloride: 103 mmol/L (ref 98–111)
Creatinine, Ser: 1 mg/dL (ref 0.61–1.24)
GFR, Estimated: >60 mL/min (ref >60)
Glucose, Bld: 161 mg/dL — ABNORMAL HIGH (ref 70–99)
Potassium: 3.3 mmol/L — ABNORMAL LOW (ref 3.5–5.1)
Sodium: 141 mmol/L (ref 135–145)
Total Bilirubin: 0.8 mg/dL (ref 0.3–1.2)
Total Protein: 7.5 g/dL (ref 6.5–8.1)

## 2021-01-29 LAB — TYPE AND SCREEN
ABO/RH(D): B POS
Antibody Screen: NEGATIVE

## 2021-01-29 LAB — LIPASE, BLOOD: Lipase: 28 U/L (ref 11–51)

## 2021-01-29 LAB — CBC WITH DIFFERENTIAL/PLATELET
Abs Immature Granulocytes: 0.13 10*3/uL — ABNORMAL HIGH (ref 0.00–0.07)
Basophils Absolute: 0 10*3/uL (ref 0.0–0.1)
Basophils Relative: 0 %
Eosinophils Absolute: 0 10*3/uL (ref 0.0–0.5)
Eosinophils Relative: 0 %
HCT: 43.6 % (ref 39.0–52.0)
Hemoglobin: 15.2 g/dL (ref 13.0–17.0)
Immature Granulocytes: 1 %
Lymphocytes Relative: 17 %
Lymphs Abs: 1.6 10*3/uL (ref 0.7–4.0)
MCH: 28.8 pg (ref 26.0–34.0)
MCHC: 34.9 g/dL (ref 30.0–36.0)
MCV: 82.6 fL (ref 80.0–100.0)
Monocytes Absolute: 0.7 10*3/uL (ref 0.1–1.0)
Monocytes Relative: 7 %
Neutro Abs: 6.9 10*3/uL (ref 1.7–7.7)
Neutrophils Relative %: 75 %
Platelets: 185 10*3/uL (ref 150–400)
RBC: 5.28 MIL/uL (ref 4.22–5.81)
RDW: 11.4 % — ABNORMAL LOW (ref 11.5–15.5)
WBC: 9.3 10*3/uL (ref 4.0–10.5)
nRBC: 0 % (ref 0.0–0.2)

## 2021-01-29 MED ORDER — FLUORESCEIN SODIUM 1 MG OP STRP
1.0000 | ORAL_STRIP | Freq: Once | OPHTHALMIC | Status: AC
  Administered 2021-01-30: 1 via OPHTHALMIC
  Filled 2021-01-29: qty 1

## 2021-01-29 MED ORDER — IOHEXOL 300 MG/ML  SOLN
100.0000 mL | Freq: Once | INTRAMUSCULAR | Status: AC | PRN
  Administered 2021-01-29: 100 mL via INTRAVENOUS

## 2021-01-29 MED ORDER — ONDANSETRON HCL 4 MG/2ML IJ SOLN
INTRAMUSCULAR | Status: AC
  Administered 2021-01-29: 4 mg
  Filled 2021-01-29: qty 2

## 2021-01-29 MED ORDER — FENTANYL CITRATE (PF) 100 MCG/2ML IJ SOLN
INTRAMUSCULAR | Status: AC
  Administered 2021-01-29: 50 ug via INTRAVENOUS
  Filled 2021-01-29: qty 2

## 2021-01-29 MED ORDER — FENTANYL CITRATE (PF) 100 MCG/2ML IJ SOLN
50.0000 ug | Freq: Once | INTRAMUSCULAR | Status: AC
Start: 2021-01-29 — End: 2021-01-29
  Administered 2021-01-29: 50 ug via INTRAVENOUS
  Filled 2021-01-29: qty 2

## 2021-01-29 MED ORDER — TETRACAINE HCL 0.5 % OP SOLN
2.0000 [drp] | Freq: Once | OPHTHALMIC | Status: AC
  Administered 2021-01-30: 2 [drp] via OPHTHALMIC
  Filled 2021-01-29: qty 4

## 2021-01-29 MED ORDER — FENTANYL CITRATE (PF) 100 MCG/2ML IJ SOLN: 50.0000 ug | Freq: Once | INTRAMUSCULAR | Status: AC

## 2021-01-29 NOTE — ED Notes (Signed)
Patient transported to CT 
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
## 2021-01-30 MED ORDER — OXYCODONE-ACETAMINOPHEN 5-325 MG PO TABS
1.0000 | ORAL_TABLET | Freq: Once | ORAL | Status: AC
  Administered 2021-01-30: 1 via ORAL
  Filled 2021-01-30: qty 1

## 2021-01-30 MED ORDER — ONDANSETRON 4 MG PO TBDP: 4.0000 mg | ORAL_TABLET | Freq: Three times a day (TID) | ORAL | 0 refills | Status: DC | PRN

## 2021-01-30 MED ORDER — ONDANSETRON HCL 4 MG/2ML IJ SOLN
4.0000 mg | Freq: Once | INTRAMUSCULAR | Status: AC
  Administered 2021-01-30: 4 mg via INTRAVENOUS
  Filled 2021-01-30: qty 2

## 2021-01-30 MED ORDER — OXYCODONE-ACETAMINOPHEN 5-325 MG PO TABS: 1.0000 | ORAL_TABLET | Freq: Four times a day (QID) | ORAL | 0 refills | Status: DC | PRN

## 2021-01-30 MED ORDER — CYCLOBENZAPRINE HCL 10 MG PO TABS: 5.0000 mg | ORAL_TABLET | Freq: Two times a day (BID) | ORAL | 0 refills | Status: DC | PRN

## 2021-01-30 NOTE — ED Notes (Signed)
ED Provider at bedside. 
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
## 2021-01-30 NOTE — ED Notes (Signed)
Patient given IS instruction. Able to demonstrate teach back and inhale at most 1500cc of air at this time.

## 2021-01-31 ENCOUNTER — Emergency Department (HOSPITAL_COMMUNITY)
Admission: EM | Admit: 2021-01-31 | Discharge: 2021-01-31 | Disposition: A | Discharge status: Home / Self Care | Payer: BC Managed Care – PPO | Attending: Emergency Medicine | Admitting: Emergency Medicine

## 2021-01-31 ENCOUNTER — Encounter (INDEPENDENT_AMBULATORY_CARE_PROVIDER_SITE_OTHER): Payer: Self-pay

## 2021-01-31 ENCOUNTER — Encounter (INDEPENDENT_AMBULATORY_CARE_PROVIDER_SITE_OTHER): Payer: 59 | Admitting: Ophthalmology

## 2021-01-31 DIAGNOSIS — M546 Pain in thoracic spine: Secondary | ICD-10-CM

## 2021-01-31 DIAGNOSIS — M549 Dorsalgia, unspecified: Secondary | ICD-10-CM | POA: Diagnosis present

## 2021-01-31 DIAGNOSIS — Y92410 Unspecified street and highway as the place of occurrence of the external cause: Secondary | ICD-10-CM | POA: Insufficient documentation

## 2021-01-31 DIAGNOSIS — Z91010 Allergy to peanuts: Secondary | ICD-10-CM | POA: Insufficient documentation

## 2021-01-31 DIAGNOSIS — R0781 Pleurodynia: Secondary | ICD-10-CM | POA: Insufficient documentation

## 2021-01-31 DIAGNOSIS — R079 Chest pain, unspecified: Secondary | ICD-10-CM

## 2021-01-31 MED ORDER — OXYCODONE HCL 5 MG PO TABS
5.0000 mg | ORAL_TABLET | Freq: Once | ORAL | Status: DC
  Filled 2021-01-31: qty 1

## 2021-01-31 MED ORDER — KETOROLAC TROMETHAMINE 15 MG/ML IJ SOLN
15.0000 mg | Freq: Once | INTRAMUSCULAR | Status: DC
  Filled 2021-01-31: qty 1

## 2021-01-31 MED ORDER — ACETAMINOPHEN 500 MG PO TABS
1000.0000 mg | ORAL_TABLET | Freq: Once | ORAL | Status: DC
  Filled 2021-01-31: qty 2
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
## 2021-01-31 NOTE — ED Notes (Signed)
Safe transport called to take patient home. 

## 2021-02-02 ENCOUNTER — Emergency Department (HOSPITAL_COMMUNITY): Payer: BC Managed Care – PPO

## 2021-02-02 ENCOUNTER — Telehealth (INDEPENDENT_AMBULATORY_CARE_PROVIDER_SITE_OTHER): Payer: Self-pay | Admitting: Otolaryngology

## 2021-02-02 ENCOUNTER — Ambulatory Visit: Payer: Self-pay

## 2021-02-02 ENCOUNTER — Emergency Department (HOSPITAL_COMMUNITY)
Admission: EM | Admit: 2021-02-02 | Discharge: 2021-02-02 | Disposition: A | Discharge status: Home / Self Care | Payer: BC Managed Care – PPO | Attending: Emergency Medicine | Admitting: Emergency Medicine

## 2021-02-02 ENCOUNTER — Encounter (HOSPITAL_COMMUNITY): Payer: Self-pay

## 2021-02-02 ENCOUNTER — Other Ambulatory Visit: Payer: Self-pay

## 2021-02-02 DIAGNOSIS — Z91010 Allergy to peanuts: Secondary | ICD-10-CM | POA: Diagnosis not present

## 2021-02-02 DIAGNOSIS — S2232XA Fracture of one rib, left side, initial encounter for closed fracture: Secondary | ICD-10-CM | POA: Diagnosis not present

## 2021-02-02 DIAGNOSIS — S299XXA Unspecified injury of thorax, initial encounter: Secondary | ICD-10-CM | POA: Diagnosis present

## 2021-02-02 DIAGNOSIS — Y92410 Unspecified street and highway as the place of occurrence of the external cause: Secondary | ICD-10-CM | POA: Diagnosis not present

## 2021-02-02 DIAGNOSIS — E559 Vitamin D deficiency, unspecified: Secondary | ICD-10-CM | POA: Insufficient documentation

## 2021-02-02 MED ORDER — HYDROMORPHONE HCL 1 MG/ML IJ SOLN
1.0000 mg | Freq: Once | INTRAMUSCULAR | Status: AC
Start: 2021-02-02 — End: 2021-02-02
  Administered 2021-02-02: 1 mg via INTRAMUSCULAR
  Filled 2021-02-02: qty 1

## 2021-02-02 MED ORDER — OXYCODONE-ACETAMINOPHEN 5-325 MG PO TABS: 1.0000 | ORAL_TABLET | Freq: Four times a day (QID) | ORAL | 0 refills | Status: DC | PRN
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
## 2021-02-03 DIAGNOSIS — M25512 Pain in left shoulder: Secondary | ICD-10-CM | POA: Insufficient documentation

## 2021-02-04 ENCOUNTER — Emergency Department (HOSPITAL_COMMUNITY): Payer: BC Managed Care – PPO

## 2021-02-04 ENCOUNTER — Encounter (HOSPITAL_COMMUNITY): Payer: Self-pay

## 2021-02-04 ENCOUNTER — Emergency Department (HOSPITAL_COMMUNITY)
Admission: EM | Admit: 2021-02-04 | Discharge: 2021-02-04 | Disposition: A | Discharge status: Home / Self Care | Payer: BC Managed Care – PPO | Attending: Emergency Medicine | Admitting: Emergency Medicine

## 2021-02-04 ENCOUNTER — Ambulatory Visit: Payer: Self-pay

## 2021-02-04 ENCOUNTER — Other Ambulatory Visit: Payer: Self-pay

## 2021-02-04 DIAGNOSIS — R109 Unspecified abdominal pain: Secondary | ICD-10-CM | POA: Diagnosis present
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
## 2021-02-04 NOTE — ED Notes (Signed)
Pt states he cannot wait any longer as his ride is here. Pt reports no ride if he doesn't leave right now. Pt was roomed in ED at 0752AM. Alert and oriented x 4. Ambulated to the lobby at this time on steady gait. Signed AMA form in chart.
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
## 2021-02-11 ENCOUNTER — Emergency Department
Admission: EM | Admit: 2021-02-11 | Discharge: 2021-02-11 | Disposition: A | Discharge status: Home / Self Care | Payer: BC Managed Care – PPO | Attending: Emergency Medicine | Admitting: Emergency Medicine

## 2021-02-11 ENCOUNTER — Other Ambulatory Visit: Payer: Self-pay

## 2021-02-11 DIAGNOSIS — M7989 Other specified soft tissue disorders: Secondary | ICD-10-CM | POA: Diagnosis present

## 2021-02-11 DIAGNOSIS — Z91010 Allergy to peanuts: Secondary | ICD-10-CM | POA: Insufficient documentation

## 2021-02-11 DIAGNOSIS — R600 Localized edema: Secondary | ICD-10-CM | POA: Diagnosis not present

## 2021-02-11 DIAGNOSIS — R609 Edema, unspecified: Secondary | ICD-10-CM
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
## 2021-02-11 NOTE — ED Notes (Signed)
Pt verbalized understanding of d/c instructions at this time. Pt denies further questions at this time. Pt ambulatory to ED lobby, NAD noted, RR even and unlabored, steady gait noted

## 2021-02-15 DIAGNOSIS — M25511 Pain in right shoulder: Secondary | ICD-10-CM | POA: Insufficient documentation

## 2021-03-04 DIAGNOSIS — S199XXS Unspecified injury of neck, sequela: Secondary | ICD-10-CM | POA: Insufficient documentation

## 2021-03-04 DIAGNOSIS — R29898 Other symptoms and signs involving the musculoskeletal system: Secondary | ICD-10-CM | POA: Insufficient documentation

## 2021-03-04 DIAGNOSIS — S134XXA Sprain of ligaments of cervical spine, initial encounter: Secondary | ICD-10-CM | POA: Insufficient documentation
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
## 2021-03-08 DIAGNOSIS — R0781 Pleurodynia: Secondary | ICD-10-CM

## 2021-03-08 HISTORY — DX: Pleurodynia: R07.81

## 2021-03-15 DIAGNOSIS — Z7689 Persons encountering health services in other specified circumstances: Secondary | ICD-10-CM | POA: Diagnosis not present

## 2021-03-18 DIAGNOSIS — Z7689 Persons encountering health services in other specified circumstances: Secondary | ICD-10-CM | POA: Diagnosis not present

## 2021-03-18 DIAGNOSIS — M542 Cervicalgia: Secondary | ICD-10-CM | POA: Insufficient documentation

## 2021-03-29 ENCOUNTER — Ambulatory Visit: Payer: BC Managed Care – PPO | Admitting: Nurse Practitioner

## 2021-03-29 DIAGNOSIS — M25521 Pain in right elbow: Secondary | ICD-10-CM | POA: Insufficient documentation

## 2021-03-29 DIAGNOSIS — Z7689 Persons encountering health services in other specified circumstances: Secondary | ICD-10-CM | POA: Diagnosis not present

## 2021-03-29 HISTORY — DX: Pain in right elbow: M25.521

## 2021-03-31 DIAGNOSIS — Z7689 Persons encountering health services in other specified circumstances: Secondary | ICD-10-CM | POA: Diagnosis not present

## 2021-04-01 ENCOUNTER — Telehealth: Payer: Self-pay | Admitting: Nurse Practitioner

## 2021-04-01 DIAGNOSIS — Z7689 Persons encountering health services in other specified circumstances: Secondary | ICD-10-CM | POA: Diagnosis not present

## 2021-04-01 NOTE — Telephone Encounter (Signed)
Patient called states he is having a lot abdominal discomfort and pain and is requesting to speak with a nurse.

## 2021-04-01 NOTE — Telephone Encounter (Signed)
Colleen I spoke with the patient. His symptoms are the same. He also stopped all the medication given at the last visit because he did not feel it was helpful. He says his stomach is "all messed up." Some days he has good bowel movements and feels emptied. Some days he does not. He "cannot tell if I am digesting my food or not." He missed the follow up appointment on 03/29/21. He says he just forgot. Do you want me to bring him back in? I did suggest he go back on the daily Miralax with the goal of daily emptying bowel movements. He seems a little skeptical and asks for a procedure.

## 2021-04-05 NOTE — Telephone Encounter (Signed)
Called the patient back. No answer. Left the information on the voicemail. Offered an appointment for 05/18/21 which is the first available at this time. Asked that he call back and schedule soon.

## 2021-04-06 DIAGNOSIS — Z7689 Persons encountering health services in other specified circumstances: Secondary | ICD-10-CM | POA: Diagnosis not present

## 2021-04-07 DIAGNOSIS — Z7689 Persons encountering health services in other specified circumstances: Secondary | ICD-10-CM | POA: Diagnosis not present

## 2021-04-12 DIAGNOSIS — Z7689 Persons encountering health services in other specified circumstances: Secondary | ICD-10-CM | POA: Diagnosis not present

## 2021-08-08 DIAGNOSIS — M47812 Spondylosis without myelopathy or radiculopathy, cervical region: Secondary | ICD-10-CM | POA: Insufficient documentation

## 2021-09-22 HISTORY — PX: HIP ARTHROSCOPY: SUR88

## 2021-10-07 DIAGNOSIS — Z9889 Other specified postprocedural states: Secondary | ICD-10-CM

## 2021-10-07 HISTORY — DX: Other specified postprocedural states: Z98.890
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
## 2022-03-05 ENCOUNTER — Other Ambulatory Visit: Payer: Self-pay

## 2022-03-05 ENCOUNTER — Encounter (HOSPITAL_BASED_OUTPATIENT_CLINIC_OR_DEPARTMENT_OTHER): Payer: Self-pay | Admitting: Emergency Medicine

## 2022-03-05 ENCOUNTER — Emergency Department (HOSPITAL_BASED_OUTPATIENT_CLINIC_OR_DEPARTMENT_OTHER)
Admission: EM | Admit: 2022-03-05 | Discharge: 2022-03-05 | Disposition: A | Discharge status: Home / Self Care | Payer: BC Managed Care – PPO | Attending: Emergency Medicine | Admitting: Emergency Medicine

## 2022-03-05 DIAGNOSIS — K047 Periapical abscess without sinus: Secondary | ICD-10-CM

## 2022-03-05 DIAGNOSIS — Z91010 Allergy to peanuts: Secondary | ICD-10-CM | POA: Diagnosis not present

## 2022-03-05 DIAGNOSIS — K0889 Other specified disorders of teeth and supporting structures: Secondary | ICD-10-CM | POA: Diagnosis present

## 2022-03-05 MED ORDER — PENICILLIN V POTASSIUM 500 MG PO TABS: 500.0000 mg | ORAL_TABLET | Freq: Four times a day (QID) | ORAL | 0 refills | Status: AC

## 2022-03-05 MED ORDER — CHLORHEXIDINE GLUCONATE 0.12 % MT SOLN: 15.0000 mL | Freq: Two times a day (BID) | OROMUCOSAL | 1 refills | Status: DC

## 2022-03-05 MED ORDER — IBUPROFEN 800 MG PO TABS: 800.0000 mg | ORAL_TABLET | Freq: Three times a day (TID) | ORAL | 0 refills | Status: DC

## 2022-03-05 MED ORDER — TRAMADOL HCL 50 MG PO TABS: ORAL_TABLET | ORAL | 0 refills | Status: DC

## 2022-03-05 NOTE — ED Provider Notes (Signed)
?[facility] EM[facility] DEPT ?Provider Note ? ? ?CSN: [id number] ?Arrival date & time: 03/05/22  1355 ? ?  ? ?History ? ?Chief Complaint  ?Patient presents with  ? Abscess  ? Neck Injury  ? ? ?Nicholas Johnson. is a 31 y.o. male. ? ?HPI ?Patient reports he has been having swelling and pain above his right upper teeth.  Patient reports he has had a fractured and infected tooth before.  He reports over the past several days it is gotten much worse.  He has a lot of pain up around the side of his nose and cheek.  He reports he has been previously been treated with antibiotics and knows that he needs a dental follow-up for a root canal.  He reports it has periodically gotten better and he has not yet gotten definitive treatment.  No fevers no chills.  No difficulty swallowing or breathing ?  ? ?Home Medications ?Prior to Admission medications   ?Medication Sig Start Date End Date Taking? Authorizing Provider  ?chlorhexidine (PERIDEX) 0.12 % solution Use as directed 15 mLs in the mouth or throat 2 (two) times daily. 03/05/22  Yes Arby Barrette, MD  ?ibuprofen (ADVIL) 800 MG tablet Take 1 tablet (800 mg total) by mouth 3 (three) times daily. 03/05/22  Yes Arby Barrette, MD  ?penicillin v potassium (VEETID) 500 MG tablet Take 1 tablet (500 mg total) by mouth 4 (four) times daily for 10 days. 03/05/22 03/15/22 Yes Arby Barrette, MD  ?traMADol Janean Sark) 50 MG tablet 1-2 tablets every 6 hours as needed for pain 03/05/22  Yes Otis Portal, Lebron Conners, MD  ?cyclobenzaprine (FLEXERIL) 10 MG tablet Take 0.5-1 tablets (5-10 mg total) by mouth 2 (two) times daily as needed for muscle spasms. 01/30/21   Robinson, [place] N, PA-C  ?DULoxetine (CYMBALTA) 30 MG capsule Take 30 mg by mouth 2 (two) times daily.    [provider]  ?ondansetron (ZOFRAN ODT) 4 MG disintegrating tablet Take 1 tablet (4 mg total) by mouth every 8 (eight) hours as needed for nausea or vomiting. 01/30/21   Robinson, [place] N, PA-C   ?oxyCODONE-acetaminophen (PERCOCET/ROXICET) 5-325 MG tablet Take 1 tablet by mouth every 6 (six) hours as needed for severe pain. 02/02/21   Mesner, Barbara Cower, MD  ?zonisamide (ZONEGRAN) 25 MG capsule Take 25 mg by mouth 2 (two) times daily as needed.    [provider]  ?hyoscyamine (LEVSIN SL) 0.125 MG SL tablet Place 1 tablet (0.125 mg total) under the tongue every 6 (six) hours as needed. 12/31/20 02/02/21  Arnaldo Natal, NP  ?   ? ?Allergies    ?Peanut-containing drug products and Wheat bran   ? ?Review of Systems   ?Review of Systems ?Constitutional: No fever no chills ?ENT: No difficulty swallowing or breathing ?Physical Exam ?Updated Vital Signs ?BP 129/81 (BP Location: Right Arm)   Pulse (!) 58   Temp 97.6 ?F (36.4 ?C) (Temporal)   Resp 18   Ht 5\' 8"  (1.727 m)   Wt 58.1 kg   SpO2 99%   BMI 19.46 kg/m?  ?Physical Exam ?Constitutional:   ?   Comments: Patient is alert nontoxic no respiratory distress clear mental status  ?HENT:  ?   Head: Normocephalic and atraumatic.  ?   Comments: Patient does not have significant appreciable facial swelling.  He does have tenderness over the maxilla on the right just below the zygoma. ?   Nose: Nose normal.  ?   Mouth/Throat:  ?   Comments: Patient  significantly decayed first molar right upper.  There is some boggy inflammation of the gum above this tooth.  I cannot appreciate a focal area of fluctuance.  No trismus.  Posterior airway widely patent ?Eyes:  ?   Extraocular Movements: Extraocular movements intact.  ?   Pupils: Pupils are equal, round, and reactive to light.  ?Pulmonary:  ?   Effort: Pulmonary effort is normal.  ?Musculoskeletal:     ?   General: Normal range of motion.  ?   Cervical back: Neck supple. No rigidity.  ?Lymphadenopathy:  ?   Cervical: No cervical adenopathy.  ?Skin: ?   General: Skin is warm and dry.  ?Neurological:  ?   General: No focal deficit present.  ?   Mental Status: He is oriented to person, place, and time.  ?    Coordination: Coordination normal.  ?Psychiatric:     ?   Mood and Affect: Mood normal.  ? ? ?ED Results / Procedures / Treatments   ?Labs ?(all labs ordered are listed, but only abnormal results are displayed) ?Labs Reviewed - No data to display ? ?EKG ?None ? ?Radiology ?No results found. ? ?Procedures ?Procedures  ? ? ?Medications Ordered in ED ?Medications - No data to display ? ?ED Course/ Medical Decision Making/ A&P ?  ?                        ?Medical Decision Making ? ?Patient has findings consistent with an apical abscess due to chronically decayed upper tooth.  He does have facial tenderness but not large swelling or cellulitis of the face.  At this time I do feel he is appropriate for continued outpatient management.  We will start Pen-Vee K.  Patient counseled on pain control with ibuprofen and Tylenol.  Patient has been prescribed tramadol for additional pain control if needed.  We will also prescribe Peridex.  Patient given resource guide for dental follow-up.  Patient is aware he needs follow-up ASAP and definitive treatment for recurrent apical abscess of the a severely decayed tooth ? ? ? ? ? ? ? ?Final Clinical Impression(s) / ED Diagnoses ?Final diagnoses:  ?Apical abscess  ? ? ?Rx / DC Orders ?ED Discharge Orders   ? ?      Ordered  ?  penicillin v potassium (VEETID) 500 MG tablet  4 times daily       ? 03/05/22 1547  ?  chlorhexidine (PERIDEX) 0.12 % solution  2 times daily       ? 03/05/22 1547  ?  ibuprofen (ADVIL) 800 MG tablet  3 times daily       ? 03/05/22 1547  ?  traMADol (ULTRAM) 50 MG tablet       ? 03/05/22 1547  ? ?  ?  ? ?  ? ? ?  ?Arby Barrette, MD ?03/05/22 1553 ? ?
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
## 2022-03-05 NOTE — Discharge Instructions (Addendum)
1.  Start your antibiotics today. ?2.  Take ibuprofen 800 mg every 8 hours with food for pain control.  For additional pain control you may Take Exer strength Tylenol every 6 hours with the ibuprofen.  If you need additional pain control you may take 1-2 tramadol tablets as prescribed every 6 hours. ?3.  Swish the Peridex solution twice daily ?4.  Call the dentist on Monday to get scheduled as soon as possible.  Return to emergency department if you get fevers increasing facial swelling headache or other concerning symptoms ?

## 2022-03-05 NOTE — ED Notes (Signed)
Dc instructions reviewed with patient. Patient voiced understanding. Dc with belongings.  °

## 2022-03-16 ENCOUNTER — Encounter (HOSPITAL_BASED_OUTPATIENT_CLINIC_OR_DEPARTMENT_OTHER): Payer: Self-pay

## 2022-03-16 ENCOUNTER — Emergency Department (HOSPITAL_BASED_OUTPATIENT_CLINIC_OR_DEPARTMENT_OTHER): Payer: BC Managed Care – PPO | Admitting: Radiology

## 2022-03-16 ENCOUNTER — Emergency Department (HOSPITAL_BASED_OUTPATIENT_CLINIC_OR_DEPARTMENT_OTHER)
Admission: EM | Admit: 2022-03-16 | Discharge: 2022-03-16 | Disposition: A | Discharge status: Home / Self Care | Payer: BC Managed Care – PPO | Attending: Emergency Medicine | Admitting: Emergency Medicine

## 2022-03-16 ENCOUNTER — Other Ambulatory Visit: Payer: Self-pay

## 2022-03-16 DIAGNOSIS — G8929 Other chronic pain: Secondary | ICD-10-CM | POA: Insufficient documentation

## 2022-03-16 DIAGNOSIS — Z91010 Allergy to peanuts: Secondary | ICD-10-CM | POA: Insufficient documentation

## 2022-03-16 DIAGNOSIS — M542 Cervicalgia: Secondary | ICD-10-CM | POA: Diagnosis present

## 2022-03-16 DIAGNOSIS — M47812 Spondylosis without myelopathy or radiculopathy, cervical region: Secondary | ICD-10-CM | POA: Diagnosis not present
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
## 2022-03-16 NOTE — Discharge Instructions (Addendum)
Please call the primary care doctor contact provided to see if he can establish your care with them.  Also provided his [place] neurology phone number at your request.  We have also attached some stretching exercises that might be helpful. X-ray of the neck is showing some degenerative neck disease in the lower cervical spine.  Physical therapy might be helpful.
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
## 2022-03-20 ENCOUNTER — Other Ambulatory Visit: Payer: Self-pay

## 2022-03-20 ENCOUNTER — Emergency Department (HOSPITAL_BASED_OUTPATIENT_CLINIC_OR_DEPARTMENT_OTHER)
Admission: EM | Admit: 2022-03-20 | Discharge: 2022-03-20 | Disposition: A | Discharge status: Home / Self Care | Payer: BC Managed Care – PPO | Attending: Emergency Medicine | Admitting: Emergency Medicine

## 2022-03-20 ENCOUNTER — Encounter (HOSPITAL_BASED_OUTPATIENT_CLINIC_OR_DEPARTMENT_OTHER): Payer: Self-pay

## 2022-03-20 DIAGNOSIS — G8929 Other chronic pain: Secondary | ICD-10-CM | POA: Diagnosis not present

## 2022-03-20 DIAGNOSIS — M542 Cervicalgia: Secondary | ICD-10-CM | POA: Insufficient documentation

## 2022-03-20 DIAGNOSIS — Z91010 Allergy to peanuts: Secondary | ICD-10-CM | POA: Insufficient documentation

## 2022-03-20 DIAGNOSIS — R519 Headache, unspecified: Secondary | ICD-10-CM | POA: Insufficient documentation
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
## 2022-03-20 NOTE — Discharge Instructions (Addendum)
It was a pleasure taking care of you today!  You may take over-the-counter 600 mg ibuprofen every 6 hours as needed for pain.  You may place ice or heat to the affected area for up to 15 minutes at a time.  Ensure to place a barrier between your skin and the ice/heat.  You may continue use of your prescription muscle relaxers as prescribed to you.  Do not drive or operate heavy machinery while taking this medication as it can make you sleepy or drowsy.  Below is information for neurology groups in the [facility] region.  Call and set up a follow-up appointment regarding today's ED visit.  [facility] Center [place]: [address] [place], [place] [address] [phone] [place] neurology: [address], [place], [place] [address] [phone] [facility] neurology: [address], [place], [place] [address] [phone] [facility] neurology Associates: [address] [place], [place] [address] [phone] [place] neurology Associates: [address], [address], [place], [place] [address]  You may also want to establish care with a pain management specialist and the [facility] or [place] area as needed.  Return to the emergency department for experiencing increasing/worsening headache, vision changes, inability to walk, numbness, weakness, worsening symptoms.
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
## 2022-03-20 NOTE — ED Notes (Signed)
Patient verbalizes understanding of discharge instructions. Opportunity for questioning and answers were provided. Patient discharged from ED.  °

## 2022-08-26 DIAGNOSIS — K120 Recurrent oral aphthae: Secondary | ICD-10-CM | POA: Diagnosis not present

## 2022-10-20 ENCOUNTER — Emergency Department (HOSPITAL_BASED_OUTPATIENT_CLINIC_OR_DEPARTMENT_OTHER): Payer: 59

## 2022-10-20 ENCOUNTER — Emergency Department (HOSPITAL_BASED_OUTPATIENT_CLINIC_OR_DEPARTMENT_OTHER)
Admission: EM | Admit: 2022-10-20 | Discharge: 2022-10-20 | Disposition: A | Discharge status: Home / Self Care | Payer: 59 | Attending: Emergency Medicine | Admitting: Emergency Medicine

## 2022-10-20 ENCOUNTER — Other Ambulatory Visit: Payer: Self-pay

## 2022-10-20 DIAGNOSIS — R1011 Right upper quadrant pain: Secondary | ICD-10-CM | POA: Insufficient documentation

## 2022-10-20 DIAGNOSIS — R079 Chest pain, unspecified: Secondary | ICD-10-CM | POA: Diagnosis not present

## 2022-10-20 DIAGNOSIS — Z91010 Allergy to peanuts: Secondary | ICD-10-CM | POA: Insufficient documentation

## 2022-10-20 DIAGNOSIS — U071 COVID-19: Secondary | ICD-10-CM | POA: Insufficient documentation

## 2022-10-20 DIAGNOSIS — R509 Fever, unspecified: Secondary | ICD-10-CM | POA: Diagnosis not present

## 2022-10-20 LAB — COMPREHENSIVE METABOLIC PANEL
ALT: 8 U/L (ref 0–44)
AST: 16 U/L (ref 15–41)
Albumin: 4.9 g/dL (ref 3.5–5.0)
Alkaline Phosphatase: 46 U/L (ref 38–126)
Anion gap: 13 (ref 5–15)
BUN: 8 mg/dL (ref 6–20)
CO2: 26 mmol/L (ref 22–32)
Calcium: 9.8 mg/dL (ref 8.9–10.3)
Chloride: 99 mmol/L (ref 98–111)
Creatinine, Ser: 1.03 mg/dL (ref 0.61–1.24)
GFR, Estimated: >60 mL/min (ref >60)
Glucose, Bld: 92 mg/dL (ref 70–99)
Potassium: 3.8 mmol/L (ref 3.5–5.1)
Sodium: 138 mmol/L (ref 135–145)
Total Bilirubin: 0.4 mg/dL (ref 0.3–1.2)
Total Protein: 7.8 g/dL (ref 6.5–8.1)

## 2022-10-20 LAB — RESP PANEL BY RT-PCR (RSV, FLU A&B, COVID)  RVPGX2
Influenza A by PCR: NEGATIVE
Influenza B by PCR: NEGATIVE
Resp Syncytial Virus by PCR: NEGATIVE
SARS Coronavirus 2 by RT PCR: POSITIVE — AB

## 2022-10-20 LAB — URINALYSIS, ROUTINE W REFLEX MICROSCOPIC
Bilirubin Urine: NEGATIVE
Glucose, UA: NEGATIVE mg/dL
Hgb urine dipstick: NEGATIVE
Ketones, ur: 40 mg/dL — AB
Leukocytes,Ua: NEGATIVE
Nitrite: NEGATIVE
Specific Gravity, Urine: 1.03 (ref 1.005–1.030)
pH: 6 (ref 5.0–8.0)

## 2022-10-20 LAB — CBC
HCT: 40.3 % (ref 39.0–52.0)
Hemoglobin: 14 g/dL (ref 13.0–17.0)
MCH: 28.4 pg (ref 26.0–34.0)
MCHC: 34.7 g/dL (ref 30.0–36.0)
MCV: 81.7 fL (ref 80.0–100.0)
Platelets: 147 10*3/uL — ABNORMAL LOW (ref 150–400)
RBC: 4.93 MIL/uL (ref 4.22–5.81)
RDW: 11.9 % (ref 11.5–15.5)
WBC: 6.2 10*3/uL (ref 4.0–10.5)
nRBC: 0 % (ref 0.0–0.2)

## 2022-10-20 LAB — LIPASE, BLOOD: Lipase: <10 U/L — ABNORMAL LOW (ref 11–51)

## 2022-10-20 MED ORDER — ACETAMINOPHEN 325 MG PO TABS
650.0000 mg | ORAL_TABLET | Freq: Once | ORAL | Status: AC | PRN
  Administered 2022-10-20: 650 mg via ORAL
  Filled 2022-10-20: qty 2

## 2022-10-20 MED ORDER — ONDANSETRON 4 MG PO TBDP: 4.0000 mg | ORAL_TABLET | Freq: Three times a day (TID) | ORAL | 0 refills | Status: DC | PRN

## 2022-10-20 MED ORDER — ONDANSETRON 4 MG PO TBDP
4.0000 mg | ORAL_TABLET | Freq: Once | ORAL | Status: AC | PRN
  Administered 2022-10-20: 4 mg via ORAL
  Filled 2022-10-20: qty 1

## 2022-10-20 NOTE — ED Notes (Signed)
Discharge paperwork given and verbally understood. 

## 2022-10-20 NOTE — ED Provider Notes (Signed)
MEDCENTER [facility] EMERGENCY DEPT Provider Note   CSN: [id number] Arrival date & time: 10/20/22  0827     History  Chief Complaint  Patient presents with   Abdominal Pain    Nicholas Johnson. is a 31 y.o. male.   Abdominal Pain Patient with abdominal pain.  Right mid abdomen.  Woke up with nausea vomiting.  Fever of 101.4 upon arrival.  Decreased appetite.  Otherwise healthy.  No previous abdominal surgery.  No dysuria.  States a little short of breath from the pain.    Past Medical History:  Diagnosis Date   Nasal congestion    Parotid gland pain    Psychosis (HCC)    admitted 12/15    Home Medications Prior to Admission medications   Medication Sig Start Date End Date Taking? Authorizing Provider  chlorhexidine (PERIDEX) 0.12 % solution Use as directed 15 mLs in the mouth or throat 2 (two) times daily. 03/05/22   Arby Barrette, MD  cyclobenzaprine (FLEXERIL) 10 MG tablet Take 0.5-1 tablets (5-10 mg total) by mouth 2 (two) times daily as needed for muscle spasms. 01/30/21   Robinson, [place] N, PA-C  DULoxetine (CYMBALTA) 30 MG capsule Take 30 mg by mouth 2 (two) times daily.    [provider]  DULoxetine (CYMBALTA) 60 MG capsule Take 60 mg by mouth daily.    [provider]  ibuprofen (ADVIL) 800 MG tablet Take 1 tablet (800 mg total) by mouth 3 (three) times daily. 03/05/22   Arby Barrette, MD  naproxen (NAPROSYN) 500 MG tablet Take 500 mg by mouth 2 (two) times daily. 03/03/22   [provider]  ondansetron (ZOFRAN-ODT) 4 MG disintegrating tablet Take 1 tablet (4 mg total) by mouth every 8 (eight) hours as needed for nausea or vomiting. 10/20/22   Benjiman Core, MD  oxyCODONE-acetaminophen (PERCOCET/ROXICET) 5-325 MG tablet Take 1 tablet by mouth every 6 (six) hours as needed for severe pain. 02/02/21   Mesner, Barbara Cower, MD  pregabalin (LYRICA) 75 MG capsule Take 75 mg by mouth 2 (two) times daily.    [provider]  traMADol  Janean Sark) 50 MG tablet 1-2 tablets every 6 hours as needed for pain 03/05/22   Arby Barrette, MD  zonisamide (ZONEGRAN) 25 MG capsule Take 25 mg by mouth 2 (two) times daily as needed.    [provider]  hyoscyamine (LEVSIN SL) 0.125 MG SL tablet Place 1 tablet (0.125 mg total) under the tongue every 6 (six) hours as needed. 12/31/20 02/02/21  Arnaldo Natal, NP      Allergies    Peanut-containing drug products and Wheat bran    Review of Systems   Review of Systems  Gastrointestinal:  Positive for abdominal pain.    Physical Exam Updated Vital Signs BP 121/68 (BP Location: Right Arm)   Pulse 81   Temp 99.2 F (37.3 C) (Oral)   Resp 18   Wt 59 kg   SpO2 99%   BMI 19.77 kg/m  Physical Exam Vitals and nursing note reviewed.  Cardiovascular:     Rate and Rhythm: Normal rate and regular rhythm.  Abdominal:     Tenderness: There is abdominal tenderness.     Hernia: No hernia is present.     Comments: Right upper quadrant tenderness without rebound or guarding.  No hernia palpated.  Skin:    General: Skin is warm.     Capillary Refill: Capillary refill takes less than 2 seconds.  Neurological:     Mental  Status: He is alert and oriented to person, place, and time.     ED Results / Procedures / Treatments   Labs (all labs ordered are listed, but only abnormal results are displayed) Labs Reviewed  RESP PANEL BY RT-PCR (RSV, FLU A&B, COVID)  RVPGX2 - Abnormal; Notable for the following components:      Result Value   SARS Coronavirus 2 by RT PCR POSITIVE (*)    All other components within normal limits  LIPASE, BLOOD - Abnormal; Notable for the following components:   Lipase <10 (*)    All other components within normal limits  CBC - Abnormal; Notable for the following components:   Platelets 147 (*)    All other components within normal limits  URINALYSIS, ROUTINE W REFLEX MICROSCOPIC - Abnormal; Notable for the following components:   Ketones, ur 40  (*)    Protein, ur TRACE (*)    All other components within normal limits  COMPREHENSIVE METABOLIC PANEL    EKG None  Radiology DG Chest Portable 1 View  Result Date: 10/20/2022 CLINICAL DATA:  Chest pain EXAM: PORTABLE CHEST 1 VIEW COMPARISON:  02/02/2021 FINDINGS: The heart size and mediastinal contours are within normal limits. Both lungs are clear. The visualized skeletal structures are unremarkable. IMPRESSION: No active disease. Electronically Signed   By: Duanne Guess D.O.   On: 10/20/2022 10:18   US Abdomen Limited RUQ (LIVER/GB)  Result Date: 10/20/2022 CLINICAL DATA:  Right upper quadrant abdominal pain EXAM: ULTRASOUND ABDOMEN LIMITED RIGHT UPPER QUADRANT COMPARISON:  None Available. FINDINGS: Gallbladder: No gallstones or wall thickening visualized. No sonographic Murphy sign noted by sonographer. Common bile duct: Diameter: 4 mm, which is normal Liver: No focal lesion identified. Within normal limits in parenchymal echogenicity. Portal vein is patent on color Doppler imaging with normal direction of blood flow towards the liver. Other: None. IMPRESSION: No sonographic finding to explain right upper quadrant abdominal pain. Electronically Signed   By: Lorenza Cambridge M.D.   On: 10/20/2022 09:56    Procedures Procedures    Medications Ordered in ED Medications  ondansetron (ZOFRAN-ODT) disintegrating tablet 4 mg (4 mg Oral Given 10/20/22 0844)  acetaminophen (TYLENOL) tablet 650 mg (650 mg Oral Given 10/20/22 0844)    ED Course/ Medical Decision Making/ A&P                           Medical Decision Making Amount and/or Complexity of Data Reviewed Labs: ordered. Radiology: ordered.  Risk OTC drugs. Prescription drug management.   Patient with abdominal pain.  Right upper quadrant.  Fever.  Does feel little short of breath.  Decreased appetite.  Differential diagnosis includes biliary disease particular with tenderness.  However lab work reassuring.  COVID  testing did come back positive which could potentially redoing the fever.  Will add chest x-ray to evaluate for pneumonia.  Also ultrasound be done to evaluate for biliary issues. Ultrasound reassuring.  Found to have COVID-19.  Think that is most likely the cause of the pain.  Discharge home with symptomatic treatment.  Discussed about potential Paxlovid but patient deferred.        Final Clinical Impression(s) / ED Diagnoses Final diagnoses:  COVID-19  Right upper quadrant abdominal pain    Rx / DC Orders ED Discharge Orders          Ordered    ondansetron (ZOFRAN-ODT) 4 MG disintegrating tablet  Every 8 hours PRN,   Status:  Discontinued        10/20/22 1126    ondansetron (ZOFRAN-ODT) 4 MG disintegrating tablet  Every 8 hours PRN        10/20/22 1132              Benjiman Core, MD 10/20/22 1537

## 2022-10-20 NOTE — ED Triage Notes (Addendum)
Woke up with nausea, HA, and lower abd pain. Denies diarrhea or testicle pain. No h/o abd surgery. Does not take any daily medications or endorse any PMH.

## 2022-10-24 ENCOUNTER — Other Ambulatory Visit: Payer: Self-pay

## 2022-10-25 ENCOUNTER — Ambulatory Visit (INDEPENDENT_AMBULATORY_CARE_PROVIDER_SITE_OTHER): Payer: 59 | Admitting: Family Medicine

## 2022-10-25 ENCOUNTER — Encounter (HOSPITAL_BASED_OUTPATIENT_CLINIC_OR_DEPARTMENT_OTHER): Payer: Self-pay | Admitting: Family Medicine

## 2022-10-25 VITALS — BP 132/88 | HR 70 | Temp 97.8°F | Ht 68.0 in | Wt 129.3 lb

## 2022-10-25 DIAGNOSIS — R519 Headache, unspecified: Secondary | ICD-10-CM | POA: Diagnosis not present

## 2022-10-25 DIAGNOSIS — Z0000 Encounter for general adult medical examination without abnormal findings: Secondary | ICD-10-CM

## 2022-10-25 DIAGNOSIS — G8929 Other chronic pain: Secondary | ICD-10-CM

## 2022-10-25 DIAGNOSIS — G44309 Post-traumatic headache, unspecified, not intractable: Secondary | ICD-10-CM | POA: Insufficient documentation

## 2022-10-25 DIAGNOSIS — G44321 Chronic post-traumatic headache, intractable: Secondary | ICD-10-CM | POA: Insufficient documentation

## 2022-10-25 HISTORY — DX: Headache, unspecified: R51.9

## 2022-10-25 HISTORY — DX: Other chronic pain: G89.29

## 2022-10-25 NOTE — Assessment & Plan Note (Signed)
Recommend continuing with scheduled evaluation with neurology.  Recommend maintaining headache diary to be able to review this with neurology at time of office visit.
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
## 2022-10-25 NOTE — Assessment & Plan Note (Signed)
>>  ASSESSMENT AND PLAN FOR POST-TRAUMATIC HEADACHE, NOT INTRACTABLE WRITTEN ON 10/25/2022 12:43 PM BY DE [place], RAYMOND J, MD  Recommend continuing with scheduled evaluation with neurology.  Recommend maintaining headache diary to be able to review this with neurology at time of office visit.

## 2022-10-25 NOTE — Patient Instructions (Signed)
  Medication Instructions:  Your physician recommends that you continue on your current medications as directed. Please refer to the Current Medication list given to you today. --If you need a refill on any your medications before your next appointment, please call your pharmacy first. If no refills are authorized on file call the office.-- Lab Work: Your physician has recommended that you have lab work today: Nurse visit for labs few days before physical. If you have labs (blood work) drawn today and your tests are completely normal, you will receive your results via [facility] a phone call from our staff.  Please ensure you check your voicemail in the event that you authorized detailed messages to be left on a delegated number. If you have any lab test that is abnormal or we need to change your treatment, we will call you to review the results.  Referrals/Procedures/Imaging: No  Follow-Up: Your next appointment:   Your physician recommends that you schedule a follow-up appointment in: 2-3 months physical with Dr. de [place].  You will receive a text message or e-mail with a link to a survey about your care and experience with [place] today! We would greatly appreciate your feedback!   Thanks for letting us be apart of your health journey!!  Primary Care and Sports Medicine   Dr. Arlina Robes [place]   We encourage you to activate your patient portal called "MyChart".  Sign up information is provided on this After Visit Summary.  MyChart is used to connect with patients for Virtual Visits (Telemedicine).  Patients are able to view lab/test results, encounter notes, upcoming appointments, etc.  Non-urgent messages can be sent to your provider as well. To learn more about what you can do with MyChart, please visit --  [email or web address].

## 2022-10-30 DIAGNOSIS — H5713 Ocular pain, bilateral: Secondary | ICD-10-CM | POA: Diagnosis not present

## 2022-10-30 DIAGNOSIS — M62830 Muscle spasm of back: Secondary | ICD-10-CM | POA: Diagnosis not present

## 2022-10-30 DIAGNOSIS — M531 Cervicobrachial syndrome: Secondary | ICD-10-CM | POA: Diagnosis not present

## 2022-10-30 DIAGNOSIS — M9901 Segmental and somatic dysfunction of cervical region: Secondary | ICD-10-CM | POA: Diagnosis not present

## 2022-10-30 DIAGNOSIS — M9908 Segmental and somatic dysfunction of rib cage: Secondary | ICD-10-CM | POA: Diagnosis not present

## 2022-10-30 DIAGNOSIS — M9902 Segmental and somatic dysfunction of thoracic region: Secondary | ICD-10-CM | POA: Diagnosis not present

## 2022-11-02 ENCOUNTER — Other Ambulatory Visit: Payer: Self-pay

## 2022-11-02 ENCOUNTER — Emergency Department (HOSPITAL_BASED_OUTPATIENT_CLINIC_OR_DEPARTMENT_OTHER)
Admission: EM | Admit: 2022-11-02 | Discharge: 2022-11-02 | Disposition: A | Discharge status: Home / Self Care | Payer: 59 | Attending: Emergency Medicine | Admitting: Emergency Medicine

## 2022-11-02 ENCOUNTER — Encounter (HOSPITAL_BASED_OUTPATIENT_CLINIC_OR_DEPARTMENT_OTHER): Payer: Self-pay | Admitting: Emergency Medicine

## 2022-11-02 DIAGNOSIS — J029 Acute pharyngitis, unspecified: Secondary | ICD-10-CM | POA: Insufficient documentation

## 2022-11-02 DIAGNOSIS — Z91010 Allergy to peanuts: Secondary | ICD-10-CM | POA: Diagnosis not present

## 2022-11-02 DIAGNOSIS — R070 Pain in throat: Secondary | ICD-10-CM | POA: Diagnosis not present

## 2022-11-02 DIAGNOSIS — Z1152 Encounter for screening for COVID-19: Secondary | ICD-10-CM | POA: Insufficient documentation

## 2022-11-02 LAB — GROUP A STREP BY PCR: Group A Strep by PCR: NOT DETECTED

## 2022-11-02 LAB — RESP PANEL BY RT-PCR (RSV, FLU A&B, COVID)  RVPGX2
Influenza A by PCR: NEGATIVE
Influenza B by PCR: NEGATIVE
Resp Syncytial Virus by PCR: NEGATIVE
SARS Coronavirus 2 by RT PCR: NEGATIVE

## 2022-11-02 MED ORDER — DEXAMETHASONE 1 MG/ML PO CONC: 10.0000 mg | Freq: Once | ORAL | Status: DC

## 2022-11-02 MED ORDER — OMEPRAZOLE 20 MG PO CPDR: 20.0000 mg | DELAYED_RELEASE_CAPSULE | Freq: Two times a day (BID) | ORAL | 0 refills | Status: DC

## 2022-11-02 MED ORDER — DEXAMETHASONE 10 MG/ML FOR PEDIATRIC ORAL USE
10.0000 mg | Freq: Once | INTRAMUSCULAR | Status: AC
  Administered 2022-11-02: 10 mg via ORAL
  Filled 2022-11-02: qty 1

## 2022-11-02 MED ORDER — IBUPROFEN 100 MG/5ML PO SUSP
600.0000 mg | Freq: Once | ORAL | Status: AC
  Administered 2022-11-02: 600 mg via ORAL
  Filled 2022-11-02: qty 30

## 2022-11-02 NOTE — ED Notes (Signed)
Reviewed AVS/discharge instruction with patient. Patient requested to speak with provider again prior to d/c.  Provider notified.
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
## 2022-11-02 NOTE — Discharge Instructions (Addendum)
-  COVID, flu, RSV negative.   -Strep negative.   -Minimal swelling with redness of the oropharynx found on physical exam.  Steroids and liquid Motrin given to help decrease inflammation. -Return to emergency department unable to breathe or unable to tolerate secretions.  -Keep your appointment with ENT specialist.  If they are unable to suggest any etiologies for your throat pain please follow-up with GI specialist provided.  Scription sent to your pharmacy for omeprazole which is a medication to help decrease the acid in your stomach to help prevent acid reflux.

## 2022-11-02 NOTE — ED Triage Notes (Signed)
Pt arrives to ED with c/o sore throat x1 week. He notes COVID positive test x12 days ago.

## 2022-11-03 ENCOUNTER — Other Ambulatory Visit: Payer: Self-pay

## 2022-11-03 ENCOUNTER — Emergency Department (HOSPITAL_BASED_OUTPATIENT_CLINIC_OR_DEPARTMENT_OTHER)
Admission: EM | Admit: 2022-11-03 | Discharge: 2022-11-03 | Disposition: A | Discharge status: Home / Self Care | Payer: 59 | Attending: Emergency Medicine | Admitting: Emergency Medicine

## 2022-11-03 ENCOUNTER — Encounter (HOSPITAL_BASED_OUTPATIENT_CLINIC_OR_DEPARTMENT_OTHER): Payer: Self-pay | Admitting: Radiology

## 2022-11-03 DIAGNOSIS — Z91010 Allergy to peanuts: Secondary | ICD-10-CM | POA: Diagnosis not present

## 2022-11-03 DIAGNOSIS — R1310 Dysphagia, unspecified: Secondary | ICD-10-CM | POA: Insufficient documentation

## 2022-11-03 NOTE — ED Triage Notes (Signed)
Pt states that when he eats food gets stuck in his throat. He states that it feels like it is interfering with his vocal cords. States that it is "disturbing his sleep patterns".

## 2022-11-03 NOTE — ED Provider Notes (Signed)
[facility] EMER[facility] DEPT  Provider Note  CSN: [id number] Arrival date & time: 11/03/22 2143  History Chief Complaint  Patient presents with   swallowing issue    Nicholas Johnson. is a 32 y.o. male reports feeling like food is getting stuck in his R throat for the last several months. His food is going down, but some of it gets stuck and not improved with drinking fluids. He was in the ED for same yesterday, swabs were neg. Given decadron and Rx for PPI. He already has ENT and GI follow up scheduled. Returned tonight because he felt like it was affecting his sleep.    Home Medications Prior to Admission medications   Medication Sig Start Date End Date Taking? Authorizing Provider  ibuprofen (ADVIL) 800 MG tablet Take 1 tablet (800 mg total) by mouth 3 (three) times daily. 03/05/22   Charlesetta Shanks, MD  omeprazole (PRILOSEC) 20 MG capsule Take 1 capsule (20 mg total) by mouth 2 (two) times daily before a meal for 14 days. 11/02/22 12/01/45  [name] Stall P, DO  hyoscyamine (LEVSIN SL) 0.125 MG SL tablet Place 1 tablet (0.125 mg total) under the tongue every 6 (six) hours as needed. 12/31/20 02/02/21  Noralyn Pick, NP     Allergies    Peanut-containing drug products and Wheat bran   Review of Systems   Review of Systems Please see HPI for pertinent positives and negatives  Physical Exam BP 120/78 (BP Location: Right Arm)   Pulse 65   Temp (!) 97.4 F (36.3 C)   Resp 18   Ht 5\' 8"  (1.727 m)   Wt 59 kg   SpO2 99%   BMI 19.77 kg/m   Physical Exam Vitals and nursing note reviewed.  Constitutional:      Appearance: Normal appearance.  HENT:     Head: Normocephalic and atraumatic.     Nose: Nose normal.     Mouth/Throat:     Mouth: Mucous membranes are moist.     Tongue: No lesions.     Palate: No mass and lesions.     Pharynx: Oropharynx is clear. No pharyngeal swelling, oropharyngeal exudate, posterior oropharyngeal erythema or uvula swelling.      Tonsils: No tonsillar exudate or tonsillar abscesses.  Eyes:     Extraocular Movements: Extraocular movements intact.     Conjunctiva/sclera: Conjunctivae normal.  Cardiovascular:     Rate and Rhythm: Normal rate.  Pulmonary:     Effort: Pulmonary effort is normal.     Breath sounds: Normal breath sounds. No stridor.  Abdominal:     General: Abdomen is flat.     Palpations: Abdomen is soft.     Tenderness: There is no abdominal tenderness.  Musculoskeletal:        General: No swelling. Normal range of motion.     Cervical back: Neck supple.  Skin:    General: Skin is warm and dry.  Neurological:     General: No focal deficit present.     Mental Status: He is alert.  Psychiatric:        Mood and Affect: Mood normal.     ED Results / Procedures / Treatments   EKG None  Procedures Procedures  Medications Ordered in the ED Medications - No data to display  Initial Impression and Plan  Patient here with long standing dysphagia, exam in the ED is benign. Here yesterday for same. Advised there is no further ED evaluation indicated at this  time. No signs of airway or esophageal obstruction. Encouraged to keep his already scheduled follow up for further evaluation.   ED Course       MDM Rules/Calculators/A&P Medical Decision Making Problems Addressed: Dysphagia, unspecified type: chronic illness or injury    Final Clinical Impression(s) / ED Diagnoses Final diagnoses:  Dysphagia, unspecified type    Rx / DC Orders ED Discharge Orders     None        Truddie Hidden, MD 11/03/22 2319

## 2022-11-04 ENCOUNTER — Emergency Department (HOSPITAL_COMMUNITY)
Admission: EM | Admit: 2022-11-04 | Discharge: 2022-11-04 | Disposition: A | Discharge status: Home / Self Care | Payer: 59 | Attending: Emergency Medicine | Admitting: Emergency Medicine

## 2022-11-04 ENCOUNTER — Other Ambulatory Visit: Payer: Self-pay

## 2022-11-04 DIAGNOSIS — R1310 Dysphagia, unspecified: Secondary | ICD-10-CM | POA: Insufficient documentation

## 2022-11-04 DIAGNOSIS — Z5321 Procedure and treatment not carried out due to patient leaving prior to being seen by health care provider: Secondary | ICD-10-CM | POA: Insufficient documentation

## 2022-11-04 MED ORDER — PANTOPRAZOLE SODIUM 40 MG PO TBEC: 40.0000 mg | DELAYED_RELEASE_TABLET | Freq: Every day | ORAL | Status: DC

## 2022-11-04 NOTE — ED Provider Triage Note (Signed)
  Emergency Medicine Provider Triage Evaluation Note  MRN:  [id number]  Arrival date & time: 11/04/22    Medically screening exam initiated at 2:06 AM.   CC:   Dysphagia  HPI:  Nicholas Johnson. is a 32 y.o. year-old male presents to the ED with chief complaint of dysphagia.  Seen earlier for the same.  No change in symptoms.  Has ENT and GI follow-up.  Recent rx for PPI.  History provided by patient. ROS:  -As included in HPI PE:   Vitals:   11/04/22 0156  BP: 129/84  Pulse: 64  Resp: 17  Temp: 98.2 F (36.8 C)  SpO2: 100%    Non-toxic appearing No respiratory distress  MDM:  Based on signs and symptoms, GERD is highest on my differential, followed by esophageal stenosis. I've ordered omeprazole in triage to expedite lab/diagnostic workup.  Patient was informed that the remainder of the evaluation will be completed by another provider, this initial triage assessment does not replace that evaluation, and the importance of remaining in the ED until their evaluation is complete.    Montine Circle, PA-C 11/04/22 0207

## 2022-11-04 NOTE — ED Triage Notes (Signed)
The pt reports a sore throat  he does not want to tell me more since he has already talked to the  pa

## 2022-11-04 NOTE — ED Notes (Signed)
Pt stated he was leaving AMA due to wait

## 2022-11-05 ENCOUNTER — Encounter (HOSPITAL_COMMUNITY): Payer: Self-pay | Admitting: Emergency Medicine

## 2022-11-05 ENCOUNTER — Emergency Department (HOSPITAL_COMMUNITY)
Admission: EM | Admit: 2022-11-05 | Discharge: 2022-11-05 | Discharge status: Against Medical Advice | Payer: 59 | Attending: Emergency Medicine | Admitting: Emergency Medicine

## 2022-11-05 ENCOUNTER — Other Ambulatory Visit: Payer: Self-pay

## 2022-11-05 DIAGNOSIS — Z5321 Procedure and treatment not carried out due to patient leaving prior to being seen by health care provider: Secondary | ICD-10-CM | POA: Insufficient documentation

## 2022-11-05 DIAGNOSIS — R1310 Dysphagia, unspecified: Secondary | ICD-10-CM | POA: Diagnosis not present

## 2022-11-05 DIAGNOSIS — R220 Localized swelling, mass and lump, head: Secondary | ICD-10-CM | POA: Insufficient documentation

## 2022-11-05 NOTE — ED Triage Notes (Signed)
32 yo male presents c/o throat swelling and difficulty swallowing x 8 months. Pt states that it has gotten progressively worse where he feels as though his voice sounds hoarse, constantly clearing his throat. Inability to eat due to food feeling like its getting stuck upon swallowing. Pt reports having an appointment scheduled with ENT on Tuesday but he is having difficulty sleeping due to his throat which made him come in to be seen sooner.

## 2022-11-06 ENCOUNTER — Encounter (HOSPITAL_BASED_OUTPATIENT_CLINIC_OR_DEPARTMENT_OTHER): Payer: Self-pay | Admitting: Emergency Medicine

## 2022-11-06 ENCOUNTER — Encounter (HOSPITAL_BASED_OUTPATIENT_CLINIC_OR_DEPARTMENT_OTHER): Payer: Self-pay | Admitting: Family Medicine

## 2022-11-06 ENCOUNTER — Ambulatory Visit: Payer: 59 | Admitting: Physician Assistant

## 2022-11-06 ENCOUNTER — Telehealth: Payer: Self-pay | Admitting: Physician Assistant

## 2022-11-06 ENCOUNTER — Ambulatory Visit (INDEPENDENT_AMBULATORY_CARE_PROVIDER_SITE_OTHER): Payer: 59 | Admitting: Family Medicine

## 2022-11-06 ENCOUNTER — Emergency Department (HOSPITAL_BASED_OUTPATIENT_CLINIC_OR_DEPARTMENT_OTHER)
Admission: EM | Admit: 2022-11-06 | Discharge: 2022-11-06 | Disposition: A | Discharge status: Home / Self Care | Payer: 59 | Attending: Emergency Medicine | Admitting: Emergency Medicine

## 2022-11-06 ENCOUNTER — Other Ambulatory Visit: Payer: Self-pay

## 2022-11-06 ENCOUNTER — Encounter: Payer: Self-pay | Admitting: Physician Assistant

## 2022-11-06 VITALS — BP 98/62 | HR 74 | Ht 68.0 in | Wt 124.0 lb

## 2022-11-06 VITALS — BP 112/67 | HR 75 | Temp 97.7°F | Ht 68.0 in | Wt 126.3 lb

## 2022-11-06 DIAGNOSIS — Z91010 Allergy to peanuts: Secondary | ICD-10-CM | POA: Insufficient documentation

## 2022-11-06 DIAGNOSIS — R1313 Dysphagia, pharyngeal phase: Secondary | ICD-10-CM | POA: Diagnosis not present

## 2022-11-06 DIAGNOSIS — R070 Pain in throat: Secondary | ICD-10-CM | POA: Insufficient documentation

## 2022-11-06 DIAGNOSIS — M542 Cervicalgia: Secondary | ICD-10-CM

## 2022-11-06 DIAGNOSIS — J392 Other diseases of pharynx: Secondary | ICD-10-CM | POA: Diagnosis not present

## 2022-11-06 DIAGNOSIS — R1310 Dysphagia, unspecified: Secondary | ICD-10-CM

## 2022-11-06 NOTE — Telephone Encounter (Signed)
Patient was seen today for his throat closing and he is already back at the ER at [facility]. He feels he's not getting the help he needs and doesn't know what to do. Please advise.

## 2022-11-06 NOTE — Discharge Instructions (Signed)
You need to keep your appointment tomorrow.  You are already established with the right specialist for your symptoms.

## 2022-11-06 NOTE — ED Notes (Signed)
Reviewed AVS/discharge instruction with patient. Time allotted for and all questions answered. Patient is agreeable for d/c and escorted to ed exit by staff.  

## 2022-11-06 NOTE — Progress Notes (Signed)
    Procedures performed today:    None.  Independent interpretation of notes and tests performed by another provider:   None.  Brief History, Exam, Impression, and Recommendations:    BP 112/67 (BP Location: Right Arm, Patient Position: Sitting, Cuff Size: Normal)   Pulse 75   Temp 97.7 F (36.5 C) (Oral)   Ht 5\' 8"  (1.727 m)   Wt 126 lb 4.8 oz (57.3 kg)   SpO2 99%   BMI 19.20 kg/m   Dysphagia Patient reports that he has had ongoing dysphagia for at least several months, likely longer.  More recently, he has been having increased symptoms over the past several days, he did end up having multiple trips to the emergency department.  After these visits, he was recommended to continue with outpatient evaluation with ENT and GI.  He did have appointment GI earlier today and they arranged for upcoming testing and evaluation.  He does have an appointment tomorrow with ENT. Today, he has some questions and concerns related to how recent symptoms and subsequently absences have been affecting his work [employer], [employer].  To this end, he indicates that he is needing paperwork completed in order to protect his employment.  Paperwork was reviewed with patient in the office today and we did discuss general considerations and recommendations.  Given upcoming evaluation with specialists, recommend that he further discuss work-related restrictions or any planned absences related to follow-up appointments, testing and evaluations such that they may provide more accurate expectations related to potential absences and need for any with restrictions.  Patient voiced understanding and agreement   ___________________________________________ Orson Rho de [place], MD, ABFM, [facility] and [place]

## 2022-11-06 NOTE — Assessment & Plan Note (Signed)
Patient reports that he has had ongoing dysphagia for at least several months, likely longer.  More recently, he has been having increased symptoms over the past several days, he did end up having multiple trips to the emergency department.  After these visits, he was recommended to continue with outpatient evaluation with ENT and GI.  He did have appointment GI earlier today and they arranged for upcoming testing and evaluation.  He does have an appointment tomorrow with ENT. Today, he has some questions and concerns related to how recent symptoms and subsequently absences have been affecting his work [employer], [employer].  To this end, he indicates that he is needing paperwork completed in order to protect his employment.  Paperwork was reviewed with patient in the office today and we did discuss general considerations and recommendations.  Given upcoming evaluation with specialists, recommend that he further discuss work-related restrictions or any planned absences related to follow-up appointments, testing and evaluations such that they may provide more accurate expectations related to potential absences and need for any with restrictions.  Patient voiced understanding and agreement

## 2022-11-06 NOTE — ED Provider Notes (Signed)
[facility] EMERGENC[facility] DEPT Provider Note   CSN: [id number] Arrival date & time: 11/06/22  1531     History  Chief Complaint  Patient presents with   Dysphagia    Chet Greenley. is a 32 y.o. male presenting with throat discomfort and feeling like his throat is closing on him.  Reports being sent down by his PCP office who saw him today.  Already is established for an ENT appointment tomorrow that he plans to attend.  HPI     Home Medications Prior to Admission medications   Medication Sig Start Date End Date Taking? Authorizing Provider  ibuprofen (ADVIL) 800 MG tablet Take 1 tablet (800 mg total) by mouth 3 (three) times daily. 03/05/22   Charlesetta Shanks, MD  omeprazole (PRILOSEC) 20 MG capsule Take 1 capsule (20 mg total) by mouth 2 (two) times daily before a meal for 14 days. 11/02/22 06/20/92  [name] Stall P, DO  hyoscyamine (LEVSIN SL) 0.125 MG SL tablet Place 1 tablet (0.125 mg total) under the tongue every 6 (six) hours as needed. 12/31/20 02/02/21  Noralyn Pick, NP      Allergies    Peanut-containing drug products and Wheat bran    Review of Systems   Review of Systems  Physical Exam Updated Vital Signs BP 118/81 (BP Location: Right Arm)   Pulse 76   Temp 98.2 F (36.8 C) (Oral)   Resp 18   SpO2 100%  Physical Exam Vitals and nursing note reviewed.  Constitutional:      Appearance: Normal appearance.  HENT:     Head: Normocephalic and atraumatic.     Mouth/Throat:     Mouth: Mucous membranes are moist.     Pharynx: Oropharynx is clear.     Comments: Airway clear tolerating secretions Eyes:     General: No scleral icterus.    Conjunctiva/sclera: Conjunctivae normal.  Pulmonary:     Effort: Pulmonary effort is normal. No respiratory distress.  Skin:    Findings: No rash.  Neurological:     Mental Status: He is alert.  Psychiatric:        Mood and Affect: Mood normal.     ED Results / Procedures / Treatments   Labs (all  labs ordered are listed, but only abnormal results are displayed) Labs Reviewed - No data to display  EKG None  Radiology No results found.  Procedures Procedures   Medications Ordered in ED Medications - No data to display  ED Course/ Medical Decision Making/ A&P                           Medical Decision Making    Additional history: Per chart review patient was seen today by GI.  Their plan is to do a barium swallow and potential CT soft tissues neck with ENT tomorrow.  If these are negative patient will have an EGD with GI.  32 year old male who presented again for the same symptoms that he has been seen for multiple times.  He reports his PCP sent him here however I do not see any record of this.  He has a GI telephone conversation that also does not report sending the patient to the emergency department.  Ultimately, there is not much more for [place] to offer.  He is stable, tolerating secretions and ultimately will need to follow-up as planned.   Final Clinical Impression(s) / ED Diagnoses Final diagnoses:  Throat pain in adult  Rx / DC Orders ED Discharge Orders     None      Results and diagnoses were explained to the patient. Return precautions discussed in full. Patient had no additional questions and expressed complete understanding.   This chart was dictated using voice recognition software.  Despite best efforts to proofread,  errors can occur which can change the documentation meaning.    Rhae Hammock, PA-C 08/65/78 4696    Lianne Cure, DO 29/52/84 2358
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
## 2022-11-06 NOTE — ED Triage Notes (Signed)
Pt arrives to ED with c/o dysphagia. This started several months ago but has worsened over the last few days. He was seen by GI and PCP today for same and sees ENT tomorrow.

## 2022-11-06 NOTE — Patient Instructions (Addendum)
You have been scheduled for a CT scan of the neck at [facility], 1st floor Radiology. You are scheduled on 11/09/22 at 8:00 am. You should arrive 15 minutes prior to your appointment time for registration.   Please follow the written instructions below on the day of your exam:   1) Do not eat anything after midnight on the night before your test.   If you have any questions regarding your exam or if you need to reschedule, you may call [facility] at [phone] between the hours of 8:00 am and 5:00 pm, Monday-Friday.    _______________________________________________________ Nicholas Johnson have been scheduled for a Barium Esophogram at [facility] (1st floor of the hospital) on 11/14/22 at 9:00 am. Please arrive 30 minutes prior to your appointment for registration. Make certain not to have anything to eat or drink 3 hours prior to your test. If you need to reschedule for any reason, please contact radiology at [phone] to do so. __________________________________________________________________ A barium swallow is an examination that concentrates on views of the esophagus. This tends to be a double contrast exam (barium and two liquids which, when combined, create a gas to distend the wall of the oesophagus) or single contrast (non-ionic iodine based). The study is usually tailored to your symptoms so a good history is essential. Attention is paid during the study to the form, structure and configuration of the esophagus, looking for functional disorders (such as aspiration, dysphagia, achalasia, motility and reflux) EXAMINATION You may be asked to change into a gown, depending on the type of swallow being performed. A radiologist and radiographer will perform the procedure. The radiologist will advise you of the type of contrast selected for your procedure and direct you during the exam. You will be asked to stand, sit or lie in several different positions and to hold a small  amount of fluid in your mouth before being asked to swallow while the imaging is performed .In some instances you may be asked to swallow barium coated marshmallows to assess the motility of a solid food bolus. The exam can be recorded as a digital or video fluoroscopy procedure. POST PROCEDURE It will take 1-2 days for the barium to pass through your system. To facilitate this, it is important, unless otherwise directed, to increase your fluids for the next 24-48hrs and to resume your normal diet.  This test typically takes about 30 minutes to perform. __________________________________________________________________________________  If your blood pressure at your visit was 140/90 or greater, please contact your primary care physician to follow up on this.  _______________________________________________________  If you are age 31 or older, your body mass index should be between 23-30. Your Body mass index is 18.85 kg/m. If this is out of the aforementioned range listed, please consider follow up with your Primary Care Provider.  If you are age 69 or younger, your body mass index should be between 19-25. Your Body mass index is 18.85 kg/m. If this is out of the aformentioned range listed, please consider follow up with your Primary Care Provider.   ________________________________________________________  The [facility] providers would like to encourage you to use [facility] to communicate with providers for non-urgent requests or questions.  Due to long hold times on the telephone, sending your provider a message by [facility] may be a faster and more efficient way to get a response.  Please allow 48 business hours for a response.  Please remember that this is for non-urgent requests.  _______________________________________________________  Due  to recent changes in healthcare laws, you may see the results of your imaging and laboratory studies on MyChart before your provider has had a chance to  review them.  We understand that in some cases there may be results that are confusing or concerning to you. Not all laboratory results come back in the same time frame and the provider may be waiting for multiple results in order to interpret others.  Please give [place] 48 hours in order for your provider to thoroughly review all the results before contacting the office for clarification of your results.

## 2022-11-07 ENCOUNTER — Telehealth: Payer: Self-pay | Admitting: Physician Assistant

## 2022-11-07 ENCOUNTER — Other Ambulatory Visit (HOSPITAL_COMMUNITY): Payer: 59

## 2022-11-07 ENCOUNTER — Ambulatory Visit (HOSPITAL_COMMUNITY): Payer: 59

## 2022-11-07 ENCOUNTER — Other Ambulatory Visit: Payer: Self-pay

## 2022-11-07 DIAGNOSIS — K219 Gastro-esophageal reflux disease without esophagitis: Secondary | ICD-10-CM | POA: Insufficient documentation

## 2022-11-07 DIAGNOSIS — R09A2 Foreign body sensation, throat: Secondary | ICD-10-CM | POA: Diagnosis not present

## 2022-11-07 NOTE — Telephone Encounter (Signed)
Patient called states he reschedule the CT due to insurance PA still pending to 11/09/2022. He is also going to call them to see if he can get them to expedite the decision process.

## 2022-11-07 NOTE — Telephone Encounter (Signed)
Patient called wanting to let us know he was able to get both exams scheduled on the same day for tomorrow. Also mentioned he might need another work note to reflect the date change.

## 2022-11-07 NOTE — Telephone Encounter (Signed)
Patient has imaging appointments on 11/08/22. Work note explaining that he will be receiving medication testing on 11/08/22 and unavailable for work written. It is available for the patient in his My Chart. Go to the "menu", then to "communications" and then "letters."

## 2022-11-07 NOTE — Telephone Encounter (Signed)
Patient is calling to find out why his CT was cancelled. I advised him he has one scheduled for tomorrow at 2 but he says that there's to be  2 CT scheduled for tomorrow. Please advise.

## 2022-11-08 ENCOUNTER — Emergency Department (HOSPITAL_COMMUNITY)
Admission: EM | Admit: 2022-11-08 | Discharge: 2022-11-08 | Discharge status: Against Medical Advice | Payer: 59 | Attending: Emergency Medicine | Admitting: Emergency Medicine

## 2022-11-08 ENCOUNTER — Ambulatory Visit (HOSPITAL_COMMUNITY): Payer: 59

## 2022-11-08 ENCOUNTER — Encounter: Payer: Self-pay | Admitting: Neurology

## 2022-11-08 ENCOUNTER — Ambulatory Visit (HOSPITAL_COMMUNITY)
Admission: RE | Admit: 2022-11-08 | Discharge: 2022-11-08 | Disposition: A | Discharge status: Home / Self Care | Payer: 59 | Source: Ambulatory Visit | Attending: Physician Assistant | Admitting: Physician Assistant

## 2022-11-08 ENCOUNTER — Other Ambulatory Visit: Payer: Self-pay

## 2022-11-08 DIAGNOSIS — M542 Cervicalgia: Secondary | ICD-10-CM

## 2022-11-08 DIAGNOSIS — J392 Other diseases of pharynx: Secondary | ICD-10-CM | POA: Insufficient documentation

## 2022-11-08 DIAGNOSIS — S1010XA Unspecified superficial injuries of throat, initial encounter: Secondary | ICD-10-CM | POA: Diagnosis not present

## 2022-11-08 DIAGNOSIS — R0789 Other chest pain: Secondary | ICD-10-CM | POA: Insufficient documentation

## 2022-11-08 DIAGNOSIS — Z5321 Procedure and treatment not carried out due to patient leaving prior to being seen by health care provider: Secondary | ICD-10-CM | POA: Insufficient documentation

## 2022-11-08 DIAGNOSIS — R1313 Dysphagia, pharyngeal phase: Secondary | ICD-10-CM

## 2022-11-08 DIAGNOSIS — R070 Pain in throat: Secondary | ICD-10-CM | POA: Insufficient documentation

## 2022-11-08 DIAGNOSIS — R0602 Shortness of breath: Secondary | ICD-10-CM | POA: Diagnosis not present

## 2022-11-08 LAB — BASIC METABOLIC PANEL
Anion gap: 8 (ref 5–15)
BUN: 6 mg/dL (ref 6–20)
CO2: 27 mmol/L (ref 22–32)
Calcium: 9.2 mg/dL (ref 8.9–10.3)
Chloride: 103 mmol/L (ref 98–111)
Creatinine, Ser: 0.83 mg/dL (ref 0.61–1.24)
GFR, Estimated: >60 mL/min (ref >60)
Glucose, Bld: 86 mg/dL (ref 70–99)
Potassium: 3.6 mmol/L (ref 3.5–5.1)
Sodium: 138 mmol/L (ref 135–145)

## 2022-11-08 LAB — CBC
HCT: 40.5 % (ref 39.0–52.0)
Hemoglobin: 13.9 g/dL (ref 13.0–17.0)
MCH: 28.2 pg (ref 26.0–34.0)
MCHC: 34.3 g/dL (ref 30.0–36.0)
MCV: 82.2 fL (ref 80.0–100.0)
Platelets: 202 10*3/uL (ref 150–400)
RBC: 4.93 MIL/uL (ref 4.22–5.81)
RDW: 11.9 % (ref 11.5–15.5)
WBC: 4.5 10*3/uL (ref 4.0–10.5)
nRBC: 0 % (ref 0.0–0.2)

## 2022-11-08 LAB — TROPONIN I (HIGH SENSITIVITY): Troponin I (High Sensitivity): 7 ng/L (ref <18)

## 2022-11-08 MED ORDER — FAMOTIDINE 20 MG PO TABS
20.0000 mg | ORAL_TABLET | Freq: Once | ORAL | Status: AC
  Administered 2022-11-08: 20 mg via ORAL
  Filled 2022-11-08: qty 1

## 2022-11-08 NOTE — Telephone Encounter (Signed)
Patient is calling to see if we can change the authorization to a stat to expedite the process of getting the CT done he has another appt for tomorrow and doesn't want it to be cancelled. Please advise

## 2022-11-08 NOTE — ED Notes (Signed)
Patient states they are leaving °

## 2022-11-08 NOTE — ED Triage Notes (Signed)
Patient reports central chest tightness for several days with mild SOB , he adds throat discomfort for several weeks , airway intact / no fever or chills.

## 2022-11-08 NOTE — ED Provider Triage Note (Signed)
Emergency Medicine Provider Triage Evaluation Note  Jaston Havens [name]. , a 32 y.o. male  was evaluated in triage.  Pt complains of throat soreness, tightness, chest tightness, ongoing for months, now no chest discomfort.  Seeing GI for possible dysphagia Hx of reflux, not on meds  Review of Systems  Positive: Sore throat Negative: Respiratory distress  Physical Exam  BP 112/61   Pulse 67   Temp 98.3 F (36.8 C)   Resp 18   SpO2 100%  Gen:   Awake, no distress   Resp:  Normal effort  MSK:   Moves extremities without difficulty  Other:  HR regular, oral exam normal, no erhythma or tonsillar exudates, no muffled voice or stridor, no hoarseness of speech on exam  Medical Decision Making  Medically screening exam initiated at 8:26 PM.  Appropriate orders placed.  [name] was informed that the remainder of the evaluation will be completed by another provider, this initial triage assessment does not replace that evaluation, and the importance of remaining in the ED until their evaluation is complete.  Likely reflux related symptoms per his presentation, but reasonable to basic screen cardiac lab, ecg Doubt pneumonia, PE, sepsis based on initial screening assessment - stable for further evaluation    Wyvonnia Dusky, MD 11/08/22 2028

## 2022-11-08 NOTE — Telephone Encounter (Signed)
Joey, what can you do to help with this?

## 2022-11-09 ENCOUNTER — Telehealth: Payer: Self-pay | Admitting: Neurology

## 2022-11-09 ENCOUNTER — Ambulatory Visit (HOSPITAL_COMMUNITY): Payer: 59

## 2022-11-09 ENCOUNTER — Other Ambulatory Visit: Payer: Self-pay | Admitting: Neurology

## 2022-11-09 ENCOUNTER — Other Ambulatory Visit: Payer: Self-pay | Admitting: Physician Assistant

## 2022-11-09 ENCOUNTER — Telehealth: Payer: Self-pay | Admitting: Physician Assistant

## 2022-11-09 ENCOUNTER — Encounter: Payer: Self-pay | Admitting: Neurology

## 2022-11-09 ENCOUNTER — Other Ambulatory Visit (HOSPITAL_COMMUNITY): Payer: 59

## 2022-11-09 ENCOUNTER — Ambulatory Visit: Payer: 59 | Admitting: Neurology

## 2022-11-09 ENCOUNTER — Other Ambulatory Visit: Payer: Self-pay

## 2022-11-09 VITALS — BP 122/80 | HR 69 | Ht 68.0 in | Wt 126.0 lb

## 2022-11-09 DIAGNOSIS — G4452 New daily persistent headache (NDPH): Secondary | ICD-10-CM

## 2022-11-09 DIAGNOSIS — G43709 Chronic migraine without aura, not intractable, without status migrainosus: Secondary | ICD-10-CM | POA: Insufficient documentation

## 2022-11-09 DIAGNOSIS — M542 Cervicalgia: Secondary | ICD-10-CM | POA: Diagnosis not present

## 2022-11-09 DIAGNOSIS — R1313 Dysphagia, pharyngeal phase: Secondary | ICD-10-CM

## 2022-11-09 DIAGNOSIS — G44321 Chronic post-traumatic headache, intractable: Secondary | ICD-10-CM | POA: Insufficient documentation

## 2022-11-09 DIAGNOSIS — J392 Other diseases of pharynx: Secondary | ICD-10-CM

## 2022-11-09 MED ORDER — LAMOTRIGINE 100 MG PO TABS: 100.0000 mg | ORAL_TABLET | Freq: Every day | ORAL | 5 refills | Status: DC

## 2022-11-09 MED ORDER — LAMOTRIGINE 25 MG PO TABS: ORAL_TABLET | ORAL | 0 refills | Status: DC

## 2022-11-09 MED ORDER — SUMATRIPTAN SUCCINATE 50 MG PO TABS: ORAL_TABLET | ORAL | 6 refills | Status: DC

## 2022-11-09 MED ORDER — SUCRALFATE 1 GM/10ML PO SUSP: ORAL | 0 refills | Status: DC

## 2022-11-09 MED ORDER — HYOSCYAMINE SULFATE 0.125 MG SL SUBL: SUBLINGUAL_TABLET | SUBLINGUAL | 0 refills | Status: DC

## 2022-11-09 NOTE — Telephone Encounter (Signed)
Patient advised and instructed.

## 2022-11-09 NOTE — Telephone Encounter (Signed)
[employer] sent to GI they obtain auth [id number] 

## 2022-11-09 NOTE — Telephone Encounter (Signed)
Patient called stating insurance has denied the ct due to reason they did not verify with him. Patient states they fixed over paperwork. Please advise.

## 2022-11-09 NOTE — Telephone Encounter (Signed)
CT was denied by his insurance.

## 2022-11-09 NOTE — Telephone Encounter (Signed)
Inbound call from patient stating that he needed to make a follow up appointment with Amy to discuss his urgent issues. I advised patient that the soonest we had was 2/9 and he stated that he needed a sooner appointment to discuss his urgent issues. Patient is requesting to speak with nurse to see if he can be worked in. Please advise.
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
## 2022-11-09 NOTE — Telephone Encounter (Signed)
CT SOFT TISSUE NECK W/CONTRAST MATERIAL  We cannot approve this request. Your healthcare provider told us that you are  having trouble swallowing. The request cannot be approved because:  There must be abnormal results from one of the following studies.  -An esophagram (a study that involves drinking a substance that shows up on x-ray, then  images are taken as the substance makes its way to the stomach).  -A laryngoscopy (a test that uses a small flexible tube with a camera on the end that takes  pictures of the inside of the throat).  -Upper endoscopy (a test that passes a tube with a tiny camera on the end through your  mouth and into your stomach). Two sets of pictures (without a dye and then with a dye) are not needed to show your  doctor the details needed to treat you. One picture study taken with a dye or without a dye  is sufficient.  Please advise on next steps

## 2022-11-09 NOTE — Telephone Encounter (Signed)
Inbound patient call, requesting to speak with Nicholas Johnson in regards to his CT scan.

## 2022-11-09 NOTE — Telephone Encounter (Signed)
Kegan calling back again with a few questions. Please advise.

## 2022-11-09 NOTE — Telephone Encounter (Signed)
[place], Amy S, PA-C   (Today,  2:36 PM) Beth - I looked at ENT eval and he had laryngoscopy which was quite normal . Also reviewed ba swallow from yesterday and it is normal - no sign of blockage or stricture , and all consistencies went down fine .  He does not have anything stuck in his throat or esophagus .  Since both are normal I dont think his insurance will approve the CT of neck - we can get thyroid US for "tenderness ant neck /over thyroid -  I would like him to try Carafate liquid  1 gm  4 times daily between meals and bedtime , and lets try Levsin sl  30 min before meals to help with the tight feeling . Have him try those meds  for next 5-6 days and call early next week with update       I have contacted the patient and reviewed with him these recommendations. He agrees to do this as instructed. Ultrasound ordered at [facility]. The patient has the telephone number if he wants to contact them to schedule. He may also wait for them to contact him. Medications [facility] [address] and this was confirmed as the correct pharmacy with the patient. No further questions at this moment.

## 2022-11-10 ENCOUNTER — Telehealth: Payer: Self-pay | Admitting: Neurology

## 2022-11-10 ENCOUNTER — Encounter (HOSPITAL_BASED_OUTPATIENT_CLINIC_OR_DEPARTMENT_OTHER): Payer: Self-pay | Admitting: Family Medicine

## 2022-11-10 ENCOUNTER — Telehealth: Payer: Self-pay | Admitting: Physician Assistant

## 2022-11-10 NOTE — Telephone Encounter (Signed)
Inbound call from patient requesting a work note from January 16th-19th. Patient is requesting note be sent through [place]. Please advise.

## 2022-11-10 NOTE — Telephone Encounter (Signed)
Patient is calling wishing to speak to the nurse and Joey regarding authorization for CT scan. Please advise

## 2022-11-10 NOTE — Telephone Encounter (Signed)
Patient called, he says he hasn't had help since he had a head injury in 01/2021. Nothing acute tonight, no head injury, nothing new, wanted to talk about MRIs and medications. I advised if if he feels something is wrong cal 911. There was nothing acute tonight, no new incidents, wanted to talk about medications and his condition from 2022. I advised if anything is acute call 911 but for questions and discussions more appropriate in the office, he thought this was an appointment line he thought that he could call and have an appointment with the on call doctor. I advised I'll let dr. Krista Blue know, non-acute should be discussed in appointments but if he has any concerns about his health this eveing he would have ot be seen in ED.  thanks

## 2022-11-10 NOTE — Telephone Encounter (Signed)
Patient is calling to check status on PA for CT also would like to get advise in the event he has issues over the weekend. Michela Pitcher he will also be available via MyChart if it's better for you to communicate.

## 2022-11-10 NOTE — Telephone Encounter (Signed)
The patient calls so often it is difficult to follow the plan. When I spoke with the patient yesterday, I explained that the insurance had denied the CT. I also explained that he would instead have an ultrasound of his throat and neck. He was supposed to take the 2 medications Amy Esterwood sent to his pharmacy. I will write this information down and send it to him through My Chart so he can refer to the plan when he is unsure. He seems to have a lot going on and he is trying hard to keep up with everything.

## 2022-11-11 ENCOUNTER — Encounter (HOSPITAL_COMMUNITY): Payer: Self-pay

## 2022-11-11 ENCOUNTER — Emergency Department (HOSPITAL_COMMUNITY)
Admission: EM | Admit: 2022-11-11 | Discharge: 2022-11-12 | Disposition: A | Discharge status: Home / Self Care | Payer: 59 | Attending: Emergency Medicine | Admitting: Emergency Medicine

## 2022-11-11 ENCOUNTER — Other Ambulatory Visit: Payer: Self-pay

## 2022-11-11 DIAGNOSIS — R070 Pain in throat: Secondary | ICD-10-CM | POA: Diagnosis not present

## 2022-11-11 DIAGNOSIS — R7309 Other abnormal glucose: Secondary | ICD-10-CM

## 2022-11-11 DIAGNOSIS — E876 Hypokalemia: Secondary | ICD-10-CM | POA: Diagnosis not present

## 2022-11-11 DIAGNOSIS — R0602 Shortness of breath: Secondary | ICD-10-CM | POA: Insufficient documentation

## 2022-11-11 DIAGNOSIS — R220 Localized swelling, mass and lump, head: Secondary | ICD-10-CM | POA: Diagnosis not present

## 2022-11-11 DIAGNOSIS — E871 Hypo-osmolality and hyponatremia: Secondary | ICD-10-CM

## 2022-11-11 DIAGNOSIS — Z91010 Allergy to peanuts: Secondary | ICD-10-CM | POA: Diagnosis not present

## 2022-11-11 DIAGNOSIS — R1310 Dysphagia, unspecified: Secondary | ICD-10-CM | POA: Diagnosis not present

## 2022-11-11 DIAGNOSIS — R09A2 Foreign body sensation, throat: Secondary | ICD-10-CM

## 2022-11-11 DIAGNOSIS — R0989 Other specified symptoms and signs involving the circulatory and respiratory systems: Secondary | ICD-10-CM | POA: Insufficient documentation

## 2022-11-11 DIAGNOSIS — R69 Illness, unspecified: Secondary | ICD-10-CM | POA: Diagnosis not present

## 2022-11-11 DIAGNOSIS — R609 Edema, unspecified: Secondary | ICD-10-CM | POA: Diagnosis not present

## 2022-11-11 LAB — CBC WITH DIFFERENTIAL/PLATELET
Abs Immature Granulocytes: 0 10*3/uL (ref 0.00–0.07)
Basophils Absolute: 0 10*3/uL (ref 0.0–0.1)
Basophils Relative: 1 %
Eosinophils Absolute: 0 10*3/uL (ref 0.0–0.5)
Eosinophils Relative: 1 %
HCT: 37.8 % — ABNORMAL LOW (ref 39.0–52.0)
Hemoglobin: 13.3 g/dL (ref 13.0–17.0)
Immature Granulocytes: 0 %
Lymphocytes Relative: 36 %
Lymphs Abs: 1.6 10*3/uL (ref 0.7–4.0)
MCH: 28.4 pg (ref 26.0–34.0)
MCHC: 35.2 g/dL (ref 30.0–36.0)
MCV: 80.6 fL (ref 80.0–100.0)
Monocytes Absolute: 0.4 10*3/uL (ref 0.1–1.0)
Monocytes Relative: 9 %
Neutro Abs: 2.4 10*3/uL (ref 1.7–7.7)
Neutrophils Relative %: 53 %
Platelets: 178 10*3/uL (ref 150–400)
RBC: 4.69 MIL/uL (ref 4.22–5.81)
RDW: 11.9 % (ref 11.5–15.5)
WBC: 4.4 10*3/uL (ref 4.0–10.5)
nRBC: 0 % (ref 0.0–0.2)

## 2022-11-11 MED ORDER — SODIUM CHLORIDE 0.9 % IV BOLUS
1000.0000 mL | Freq: Once | INTRAVENOUS | Status: AC
  Administered 2022-11-11: 1000 mL via INTRAVENOUS

## 2022-11-11 NOTE — ED Provider Notes (Signed)
[facility] Provider Note   CSN: [id number] Arrival date & time: 11/11/22  2115     History  Chief Complaint  Patient presents with   Oral Swelling    Nicholas Johnson. is a 32 y.o. male.  Pt is a 32  yo male with pmhx significant for psychosis and migraines.  He's been having trouble swallowing and having a sensation of something stuck in his throat for the past year.  He's been seen in the ED, by his pcp, ENT and GI in the last month for the same.  ENT did a laryngoscopy which was nl.  He had a barium swallow that was normal.  He called EMS today because he feels like there is something wrong with his voice.  Pt feels like there is a lump in his throat.       Home Medications Prior to Admission medications   Medication Sig Start Date End Date Taking? Authorizing Provider  hyoscyamine (LEVSIN SL) 0.125 MG SL tablet 1 tablet by mouth 30 minutes before meals 3 times a day 11/09/22   Esterwood, Amy S, PA-C  sucralfate (CARAFATE) 1 GM/10ML suspension 1 gram by mouth between meals and at bedtime 11/09/22   Esterwood, Amy S, PA-C  ibuprofen (ADVIL) 800 MG tablet Take 1 tablet (800 mg total) by mouth 3 (three) times daily. 03/05/22   Charlesetta Shanks, MD  lamoTRIgine (LAMICTAL) 100 MG tablet Take 1 tablet (100 mg total) by mouth daily. 11/09/22   Marcial Pacas, MD  lamoTRIgine (LAMICTAL) 25 MG tablet One tab qhs xone week 2 tabs qhs xone week 3 tabs qhs xone week 11/09/22   Marcial Pacas, MD  omeprazole (PRILOSEC) 20 MG capsule Take 1 capsule (20 mg total) by mouth 2 (two) times daily before a meal for 14 days. Patient not taking: Reported on 11/09/2022 11/02/22 7/82/95  [name] Stall P, DO  SUMAtriptan (IMITREX) 50 MG tablet May repeat in 2 hours if headache persists or recurs. 11/09/22   Marcial Pacas, MD      Allergies    Peanut-containing drug products and Wheat bran    Review of Systems   Review of Systems  HENT:  Positive for sore throat.   All  other systems reviewed and are negative.   Physical Exam Updated Vital Signs BP 128/67   Pulse 88   Temp 98.2 F (36.8 C) (Oral)   Resp 17   Wt 57.2 kg   SpO2 100%   BMI 19.16 kg/m  Physical Exam Vitals and nursing note reviewed.  Constitutional:      Appearance: Normal appearance.  HENT:     Head: Normocephalic and atraumatic.     Right Ear: External ear normal.     Left Ear: External ear normal.     Nose: Nose normal.     Mouth/Throat:     Mouth: Mucous membranes are moist.     Pharynx: Oropharynx is clear.  Eyes:     Extraocular Movements: Extraocular movements intact.     Conjunctiva/sclera: Conjunctivae normal.     Pupils: Pupils are equal, round, and reactive to light.  Cardiovascular:     Rate and Rhythm: Normal rate and regular rhythm.     Pulses: Normal pulses.     Heart sounds: Normal heart sounds.  Pulmonary:     Effort: Pulmonary effort is normal.     Breath sounds: Normal breath sounds.  Abdominal:     General: Abdomen is flat. Bowel  sounds are normal.     Palpations: Abdomen is soft.  Musculoskeletal:        General: Normal range of motion.     Cervical back: Normal range of motion and neck supple.  Skin:    General: Skin is warm.     Capillary Refill: Capillary refill takes less than 2 seconds.  Neurological:     General: No focal deficit present.     Mental Status: He is alert and oriented to person, place, and time.  Psychiatric:        Mood and Affect: Mood normal.        Behavior: Behavior normal.     ED Results / Procedures / Treatments   Labs (all labs ordered are listed, but only abnormal results are displayed) Labs Reviewed  BASIC METABOLIC PANEL  CBC WITH DIFFERENTIAL/PLATELET  TSH    EKG None  Radiology No results found.  Procedures Procedures    Medications Ordered in ED Medications  sodium chloride 0.9 % bolus 1,000 mL (has no administration in time range)    ED Course/ Medical Decision Making/ A&P                              Medical Decision Making Amount and/or Complexity of Data Reviewed Labs: ordered. Radiology: ordered.   This patient presents to the ED for concern of sore throat, this involves an extensive number of treatment options, and is a complaint that carries with it a high risk of complications and morbidity.  The differential diagnosis includes infection/mass   Co morbidities that complicate the patient evaluation  psychosis and migraines   Additional history obtained:  Additional history obtained from epic chart review External records from outside source obtained and reviewed including EMS report   Lab Tests:  I Ordered, and personally interpreted labs.  Labs are pending at shift change.   Imaging Studies ordered:  I ordered imaging studies including ct soft tissue neck  Pending at shift change    Cardiac Monitoring:  The patient was maintained on a cardiac monitor.  I personally viewed and interpreted the cardiac monitored which showed an underlying rhythm of: nsr   Medicines ordered and prescription drug management:  I ordered medication including IVFs  for dehydration  Reevaluation of the patient after these medicines showed that the patient improved I have reviewed the patients home medicines and have made adjustments as needed   Test Considered:  ct  Problem List / ED Course:  Globus sensation:  pt is tolerating po fluids.   CT soft tissue neck pending.  If neg, pt can be d/c.   Reevaluation:  After the interventions noted above, I reevaluated the patient and found that they have :improved   Social Determinants of Health:  Lives at home   Dispostion:  Pending at shift change.        Final Clinical Impression(s) / ED Diagnoses Final diagnoses:  Globus sensation    Rx / DC Orders ED Discharge Orders     None         Isla Pence, MD 11/11/22 2316

## 2022-11-11 NOTE — ED Triage Notes (Addendum)
Pt Called EMS d/t throat swelling/diff swallowing/"bulge in neck" x1-2 week/ pt denies [facility] but feels like throat is closing/ pt does not appear to be in any distress at this time/ pt ambulatory upon arrival.

## 2022-11-11 NOTE — ED Provider Notes (Signed)
Care assumed from Dr. Gilford Raid, patient with foreign body sensation in the neck which has had negative outpatient workup thus far pending CT of neck.  If negative, anticipate discharge.  I have reviewed and interpreted his laboratory tests, and my interpretation is mild hyponatremia which is probably not clinically significant mild hypokalemia and I have ordered a dose of oral potassium, borderline elevated random glucose level, normal CBC.  CT of the soft tissues of the neck is normal.  Have independently viewed the images, and agree with the radiologist's interpretation.  Patient has returned from CT scan complaining that the muscles of his mouth are not working properly and states that he is having some difficulty breathing.  He has noted to be mildly tachypneic but not using accessory muscles of respiration, oxygen saturation is 99-100%.  Lungs are clear.  He does endorse his mouth feeling dry and he is feeling lightheaded and hands are tingling.  This appears to be hyperventilation.  I have ordered a dose of diphenhydramine.  He feels much better following the above-noted treatment.  I have had an extensive discussion with him about hyperventilation and how to manage it.  He expresses concern about his ongoing breathing problems, so I am giving him a referral to pulmonology.  He needs to follow-up with his gastroenterologist and otolaryngologist.   Delora Fuel, MD 32/20/25 [phone]

## 2022-11-12 ENCOUNTER — Encounter (HOSPITAL_COMMUNITY): Payer: Self-pay | Admitting: Emergency Medicine

## 2022-11-12 ENCOUNTER — Emergency Department (HOSPITAL_COMMUNITY): Payer: 59

## 2022-11-12 ENCOUNTER — Emergency Department (HOSPITAL_COMMUNITY)
Admission: EM | Admit: 2022-11-12 | Discharge: 2022-11-12 | Disposition: A | Discharge status: Home / Self Care | Payer: 59 | Source: Home / Self Care | Attending: Emergency Medicine | Admitting: Emergency Medicine

## 2022-11-12 DIAGNOSIS — R0602 Shortness of breath: Secondary | ICD-10-CM | POA: Insufficient documentation

## 2022-11-12 DIAGNOSIS — R09A2 Foreign body sensation, throat: Secondary | ICD-10-CM

## 2022-11-12 DIAGNOSIS — Z91010 Allergy to peanuts: Secondary | ICD-10-CM | POA: Insufficient documentation

## 2022-11-12 DIAGNOSIS — R69 Illness, unspecified: Secondary | ICD-10-CM | POA: Diagnosis not present

## 2022-11-12 DIAGNOSIS — R1310 Dysphagia, unspecified: Secondary | ICD-10-CM | POA: Diagnosis not present

## 2022-11-12 DIAGNOSIS — R0989 Other specified symptoms and signs involving the circulatory and respiratory systems: Secondary | ICD-10-CM | POA: Insufficient documentation

## 2022-11-12 LAB — BASIC METABOLIC PANEL
Anion gap: 6 (ref 5–15)
BUN: 9 mg/dL (ref 6–20)
CO2: 28 mmol/L (ref 22–32)
Calcium: 8.8 mg/dL — ABNORMAL LOW (ref 8.9–10.3)
Chloride: 96 mmol/L — ABNORMAL LOW (ref 98–111)
Creatinine, Ser: 0.81 mg/dL (ref 0.61–1.24)
GFR, Estimated: >60 mL/min (ref >60)
Glucose, Bld: 102 mg/dL — ABNORMAL HIGH (ref 70–99)
Potassium: 3.4 mmol/L — ABNORMAL LOW (ref 3.5–5.1)
Sodium: 130 mmol/L — ABNORMAL LOW (ref 135–145)

## 2022-11-12 LAB — TSH: TSH: 2.412 u[IU]/mL (ref 0.350–4.500)

## 2022-11-12 MED ORDER — DIPHENHYDRAMINE HCL 50 MG/ML IJ SOLN
25.0000 mg | Freq: Once | INTRAMUSCULAR | Status: AC
  Administered 2022-11-12: 25 mg via INTRAVENOUS
  Filled 2022-11-12: qty 1

## 2022-11-12 MED ORDER — POTASSIUM CHLORIDE CRYS ER 20 MEQ PO TBCR
40.0000 meq | EXTENDED_RELEASE_TABLET | Freq: Once | ORAL | Status: DC
  Filled 2022-11-12: qty 2

## 2022-11-12 MED ORDER — IOHEXOL 300 MG/ML  SOLN
75.0000 mL | Freq: Once | INTRAMUSCULAR | Status: AC | PRN
  Administered 2022-11-12: 75 mL via INTRAVENOUS

## 2022-11-12 MED ORDER — POTASSIUM CHLORIDE 20 MEQ PO PACK
40.0000 meq | PACK | Freq: Two times a day (BID) | ORAL | Status: DC
  Administered 2022-11-12: 20 meq via ORAL
  Filled 2022-11-12: qty 2

## 2022-11-12 NOTE — ED Provider Notes (Signed)
EMERGENCY DEPARTMENT AT [facility] Provider Note   CSN: [id number] Arrival date & time: 11/12/22  2129     History  Chief Complaint  Patient presents with   Shortness of Breath    Nicholas Johnson. is a 32 y.o. male with a past medical history significant for psychosis and migraines who presents to the ED due to shortness of breath and globus sensation that has been ongoing for years. Patient seen yesterday in the ED for the same complaint with normal CT neck.  He has also been evaluated by ENT and GI in the last month for the same complaint.  ENT did a laryngoscopy which was normal.  Also had a barium swallow which was also normal.  Patient requesting admission given his shortness of breath.  Patient describes shortness of breath as inability to breathe through his nose.  He notes he is on a reflux medication with no relief in symptoms.  Denies sore throat.  Also states his voice sounds different. No history of asthma. No infectious symptoms.   History obtained from patient and past medical records. No interpreter used during encounter.       Home Medications Prior to Admission medications   Medication Sig Start Date End Date Taking? Authorizing Provider  hyoscyamine (LEVSIN SL) 0.125 MG SL tablet 1 tablet by mouth 30 minutes before meals 3 times a day 11/09/22   Esterwood, Amy S, PA-C  sucralfate (CARAFATE) 1 GM/10ML suspension 1 gram by mouth between meals and at bedtime 11/09/22   Esterwood, Amy S, PA-C  ibuprofen (ADVIL) 800 MG tablet Take 1 tablet (800 mg total) by mouth 3 (three) times daily. Patient not taking: Reported on 11/12/2022 03/05/22   Arby Barrette, MD  lamoTRIgine (LAMICTAL) 100 MG tablet Take 1 tablet (100 mg total) by mouth daily. 11/09/22   Levert Feinstein, MD  lamoTRIgine (LAMICTAL) 25 MG tablet One tab qhs xone week 2 tabs qhs xone week 3 tabs qhs xone week 11/09/22   Levert Feinstein, MD  omeprazole (PRILOSEC) 20 MG capsule Take 1 capsule (20 mg  total) by mouth 2 (two) times daily before a meal for 14 days. Patient not taking: Reported on 11/09/2022 11/02/22 11/16/22  Edwin Dada P, DO  SUMAtriptan (IMITREX) 50 MG tablet May repeat in 2 hours if headache persists or recurs. 11/09/22   Levert Feinstein, MD      Allergies    Peanut-containing drug products and Wheat bran    Review of Systems   Review of Systems  Constitutional:  Negative for chills and fever.  HENT:  Positive for sore throat and voice change. Negative for trouble swallowing.   Respiratory:  Positive for shortness of breath. Negative for cough.   Cardiovascular:  Negative for chest pain.  Gastrointestinal:  Negative for abdominal pain.  All other systems reviewed and are negative.   Physical Exam Updated Vital Signs BP (!) 142/98 (BP Location: Right Arm)   Pulse 92   Temp 98.2 F (36.8 C) (Oral)   Resp 16   Ht 5\' 8"  (1.727 m)   Wt 56.7 kg   SpO2 94%   BMI 19.01 kg/m  Physical Exam Vitals and nursing note reviewed.  Constitutional:      General: He is not in acute distress.    Appearance: He is not ill-appearing.  HENT:     Head: Normocephalic.  Eyes:     Pupils: Pupils are equal, round, and reactive to light.  Cardiovascular:  Rate and Rhythm: Normal rate and regular rhythm.     Pulses: Normal pulses.     Heart sounds: Normal heart sounds. No murmur heard.    No friction rub. No gallop.  Pulmonary:     Effort: Pulmonary effort is normal.     Breath sounds: Normal breath sounds.     Comments: Respirations equal and unlabored, patient able to speak in full sentences, lungs clear to auscultation bilaterally Abdominal:     General: Abdomen is flat. There is no distension.     Palpations: Abdomen is soft.     Tenderness: There is no abdominal tenderness. There is no guarding or rebound.  Musculoskeletal:        General: Normal range of motion.     Cervical back: Neck supple.  Skin:    General: Skin is warm and dry.  Neurological:     General: No  focal deficit present.     Mental Status: He is alert.  Psychiatric:        Mood and Affect: Mood normal.        Behavior: Behavior normal.     ED Results / Procedures / Treatments   Labs (all labs ordered are listed, but only abnormal results are displayed) Labs Reviewed - No data to display  EKG None  Radiology CT Soft Tissue Neck W Contrast  Result Date: 11/12/2022 CLINICAL DATA:  Difficulty swallowing EXAM: CT NECK WITH CONTRAST TECHNIQUE: Multidetector CT imaging of the neck was performed using the standard protocol following the bolus administration of intravenous contrast. RADIATION DOSE REDUCTION: This exam was performed according to the departmental dose-optimization program which includes automated exposure control, adjustment of the mA and/or kV according to patient size and/or use of iterative reconstruction technique. CONTRAST:  43mL OMNIPAQUE IOHEXOL 300 MG/ML  SOLN COMPARISON:  06/30/2020 FINDINGS: Pharynx and larynx: Normal. No mass or swelling. Salivary glands: No inflammation, mass, or stone. Thyroid: Normal. Lymph nodes: None enlarged or abnormal density. Vascular: Negative. Limited intracranial: Negative. Visualized orbits: Negative. Mastoids and visualized paranasal sinuses: Clear. Skeleton: No acute or aggressive process. Upper chest: Negative. Other: None. IMPRESSION: Normal CT of the neck. Electronically Signed   By: Ulyses Jarred M.D.   On: 11/12/2022 01:05    Procedures Procedures    Medications Ordered in ED Medications - No data to display  ED Course/ Medical Decision Making/ A&P                             Medical Decision Making Amount and/or Complexity of Data Reviewed External Data Reviewed: notes.    Details: Previous ENT, neurology, and GI notes   32 year old male presents to the ED due to persistent shortness of breath that has been ongoing for numerous years.  History of psychosis. He describes shortness of breath as inability to breathe  through his nose.  Also described globus sensation. Seen in the ED yesterday for same with unremarkable CT.  Also advised by ENT and GI with reassuring workup.  Patient requesting admission for his shortness of breath.  Arrival, patient afebrile, not tachycardic or hypoxic.  Patient in no acute distress.  Lungs clear to auscultation bilaterally.  No stridor or wheeze.  Low suspicion for pneumonia. During initial evaluation, patient with nasal congestion. Labs yesterday were reassuring. Patient ambulated in the ED with no episodes of desaturation. No evidence of respiratory distress. Airway patent. No evidence of abscess. Patient stable for discharge. Strict ED precautions  discussed with patient. Patient states understanding and agrees to plan. Patient discharged home in no acute distress and stable vitals  Has PCP       Final Clinical Impression(s) / ED Diagnoses Final diagnoses:  SOB (shortness of breath)  Globus sensation    Rx / DC Orders ED Discharge Orders     None         Karie Kirks 11/12/22 2240    Drenda Freeze, MD 11/12/22 2248

## 2022-11-12 NOTE — ED Notes (Signed)
Pt was given discharge papers and states he is not happy with the care he has received. Pt states that he did not see a doctor during his visit however PA Chrys Racer was the provider assigned to this pt and was witnessed speaking with pt in the fast track room. Pt asked to speak to Agricultural consultant. Pt spoke with charge nurse and was given number for pt experience.

## 2022-11-12 NOTE — Discharge Instructions (Addendum)
It was a pleasure taking care of you today.  As discussed, your oxygen was normal while you were in the ER.  Your CT scan yesterday was also normal.  Please follow-up with your ENT doctor for further evaluation.  Return to the ER for new or worsening symptoms.

## 2022-11-12 NOTE — ED Triage Notes (Signed)
Pt here from home with same complaint as last night , some sob over the last week , was seen in Ed for same last night

## 2022-11-12 NOTE — ED Notes (Signed)
While receiving IV contrast in CT pt reported feeling SOB, tingling in hands, feet, face, eyes and LLQ of abdomin. Pt also reports some dizziness/light headed. Bp 160/85 map 103, pulse 105, o2 100%.

## 2022-11-12 NOTE — ED Notes (Signed)
Oxygen Saturation remained at 96% while ambulating

## 2022-11-13 ENCOUNTER — Ambulatory Visit: Payer: 59 | Admitting: Neurology

## 2022-11-13 ENCOUNTER — Telehealth: Payer: Self-pay | Admitting: Physician Assistant

## 2022-11-13 ENCOUNTER — Encounter: Payer: Self-pay | Admitting: Internal Medicine

## 2022-11-13 ENCOUNTER — Encounter: Payer: Self-pay | Admitting: Emergency Medicine

## 2022-11-13 ENCOUNTER — Other Ambulatory Visit: Payer: Self-pay

## 2022-11-13 ENCOUNTER — Inpatient Hospital Stay: Admission: RE | Admit: 2022-11-13 | Payer: 59 | Source: Ambulatory Visit

## 2022-11-13 ENCOUNTER — Emergency Department (HOSPITAL_COMMUNITY)
Admission: EM | Admit: 2022-11-13 | Discharge: 2022-11-13 | Discharge status: Against Medical Advice | Payer: 59 | Attending: Emergency Medicine | Admitting: Emergency Medicine

## 2022-11-13 ENCOUNTER — Emergency Department
Admission: EM | Admit: 2022-11-13 | Discharge: 2022-11-13 | Disposition: A | Discharge status: Home / Self Care | Payer: 59 | Attending: Emergency Medicine | Admitting: Emergency Medicine

## 2022-11-13 ENCOUNTER — Ambulatory Visit: Payer: 59 | Admitting: Internal Medicine

## 2022-11-13 ENCOUNTER — Telehealth: Payer: Self-pay | Admitting: Internal Medicine

## 2022-11-13 VITALS — BP 146/88 | HR 70 | Temp 98.4°F | Ht 68.0 in | Wt 128.0 lb

## 2022-11-13 DIAGNOSIS — R1310 Dysphagia, unspecified: Secondary | ICD-10-CM | POA: Diagnosis not present

## 2022-11-13 DIAGNOSIS — R0602 Shortness of breath: Secondary | ICD-10-CM | POA: Insufficient documentation

## 2022-11-13 DIAGNOSIS — R0609 Other forms of dyspnea: Secondary | ICD-10-CM | POA: Insufficient documentation

## 2022-11-13 DIAGNOSIS — Z5321 Procedure and treatment not carried out due to patient leaving prior to being seen by health care provider: Secondary | ICD-10-CM | POA: Insufficient documentation

## 2022-11-13 DIAGNOSIS — R0600 Dyspnea, unspecified: Secondary | ICD-10-CM | POA: Insufficient documentation

## 2022-11-13 DIAGNOSIS — R09A2 Foreign body sensation, throat: Secondary | ICD-10-CM | POA: Diagnosis not present

## 2022-11-13 DIAGNOSIS — R69 Illness, unspecified: Secondary | ICD-10-CM | POA: Diagnosis not present

## 2022-11-13 DIAGNOSIS — R0789 Other chest pain: Secondary | ICD-10-CM | POA: Diagnosis not present

## 2022-11-13 DIAGNOSIS — R0989 Other specified symptoms and signs involving the circulatory and respiratory systems: Secondary | ICD-10-CM | POA: Diagnosis not present

## 2022-11-13 LAB — CBC WITH DIFFERENTIAL/PLATELET
Abs Immature Granulocytes: 0.01 10*3/uL (ref 0.00–0.07)
Basophils Absolute: 0 10*3/uL (ref 0.0–0.1)
Basophils Relative: 1 %
Eosinophils Absolute: 0 10*3/uL (ref 0.0–0.5)
Eosinophils Relative: 0 %
HCT: 40.7 % (ref 39.0–52.0)
Hemoglobin: 14.1 g/dL (ref 13.0–17.0)
Immature Granulocytes: 0 %
Lymphocytes Relative: 32 %
Lymphs Abs: 1.2 10*3/uL (ref 0.7–4.0)
MCH: 28.3 pg (ref 26.0–34.0)
MCHC: 34.6 g/dL (ref 30.0–36.0)
MCV: 81.6 fL (ref 80.0–100.0)
Monocytes Absolute: 0.3 10*3/uL (ref 0.1–1.0)
Monocytes Relative: 7 %
Neutro Abs: 2.2 10*3/uL (ref 1.7–7.7)
Neutrophils Relative %: 60 %
Platelets: 188 10*3/uL (ref 150–400)
RBC: 4.99 MIL/uL (ref 4.22–5.81)
RDW: 12 % (ref 11.5–15.5)
WBC: 3.7 10*3/uL — ABNORMAL LOW (ref 4.0–10.5)
nRBC: 0 % (ref 0.0–0.2)

## 2022-11-13 LAB — COMPREHENSIVE METABOLIC PANEL
ALT: 13 U/L (ref 0–44)
AST: 20 U/L (ref 15–41)
Albumin: 4.9 g/dL (ref 3.5–5.0)
Alkaline Phosphatase: 40 U/L (ref 38–126)
Anion gap: 6 (ref 5–15)
BUN: 7 mg/dL (ref 6–20)
CO2: 27 mmol/L (ref 22–32)
Calcium: 9.3 mg/dL (ref 8.9–10.3)
Chloride: 103 mmol/L (ref 98–111)
Creatinine, Ser: 0.81 mg/dL (ref 0.61–1.24)
GFR, Estimated: >60 mL/min (ref >60)
Glucose, Bld: 145 mg/dL — ABNORMAL HIGH (ref 70–99)
Potassium: 3.6 mmol/L (ref 3.5–5.1)
Sodium: 136 mmol/L (ref 135–145)
Total Bilirubin: 0.9 mg/dL (ref 0.3–1.2)
Total Protein: 7.9 g/dL (ref 6.5–8.1)

## 2022-11-13 LAB — TROPONIN I (HIGH SENSITIVITY): Troponin I (High Sensitivity): 8 ng/L (ref <18)

## 2022-11-13 MED ORDER — MAGIC MOUTHWASH W/LIDOCAINE: 5.0000 mL | Freq: Four times a day (QID) | ORAL | 0 refills | Status: DC

## 2022-11-13 MED ORDER — CETIRIZINE HCL 10 MG PO TABS: 10.0000 mg | ORAL_TABLET | Freq: Every day | ORAL | 2 refills | Status: DC

## 2022-11-13 MED ORDER — TRELEGY ELLIPTA 100-62.5-25 MCG/ACT IN AEPB: 1.0000 | INHALATION_SPRAY | Freq: Every day | RESPIRATORY_TRACT | 0 refills | Status: DC

## 2022-11-13 NOTE — Patient Instructions (Addendum)
Order- schedule PFT  dx dypsnea  Order- sample Trelegy 100  inhale 1 puff then rinse mouth,once daily   Try otc Zero Sugar Marolyn Hammock Rancher hard candies to soothe throat

## 2022-11-13 NOTE — Assessment & Plan Note (Signed)
No anatomic abnormality identified so far. He can keep GI f/u as planned.

## 2022-11-13 NOTE — Telephone Encounter (Signed)
Dr. Fuller Plan, please see note below. Records are available in Epic. Pt has called the office 3 times today. He is currently at his pulmonology appt. CT of the neck was ordered yesterday in the ED and it was normal. Would you like to proceed with EGD? Amy is out of the office today and tomorrow. Please advise, thanks.

## 2022-11-13 NOTE — Telephone Encounter (Signed)
Patient is calling wanting to speak with a nurse to get in as soon as possible, states February is too far. Please advise

## 2022-11-13 NOTE — ED Triage Notes (Signed)
Patient here for evaluation of short of breath and feels like there is something in his throat that is making it hard from him to breath and swallow. Patient seen multiple times for same, most recently four hours ago at [facility]. Patient is alert, oriented, and has appointment with ENT tomorrow.

## 2022-11-13 NOTE — Telephone Encounter (Signed)
Patient called to schedule EGD. Advised him per Doctors order he must complete PFT before EGD can be scheduled.

## 2022-11-13 NOTE — Telephone Encounter (Signed)
Please see if another physician has sooner availability to add on an EGD.
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
## 2022-11-13 NOTE — Telephone Encounter (Signed)
PT needs a Hospital FU. No open Spots w/and Dr's. Was not seen by our Dr's in ER/Hospt. I let him know he needs to see his PCP for a follow up as usually he should wb seen w/I 2 weeks but he said he has serious issues that land him in the ER so he needs immediate care. Pls call to advise @ [phone]

## 2022-11-13 NOTE — Telephone Encounter (Signed)
Noted, thanks!

## 2022-11-13 NOTE — ED Provider Notes (Signed)
[facility] Provider Note  Patient Contact: 4:35 PM (approximate)   History   Sore Throat   HPI  Nicholas Johnson. is a 32 y.o. male who presents emergency department complaining of a globus sensation.  Patient states that he has been dealing with this sensation for at least the past year.  He states over the past 2 weeks it has been much worse.  I am unable to access the patient's medical records and he has been seen at multiple regional ED's, has been seen by GI, ENT as well as pulmonology for this complaint.  Patient has had reassuring labs, CT scans, a scope from ENT, barium swallow from GI.  He scheduled for pulmonary function test and an EGD.  Patient has ongoing continued symptoms.  States that he is taking famotidine but is not taking Prilosec.  He had Carafate called in for him and is not taking same.  Patient has been using Flonase but is not on a daily antihistamine.  He has no fever, chills, chest pain.  He states that he feels like he cannot adequately move air through the back of his throat.  He does spend prolonged periods talking every day on the phone for his job.  Patient has been seen multiple times in recent ED's including yesterday.  Patient also been seen the day before.  Patient has had reassuring labs, and a CT scan within the last 48 hours.     Physical Exam   Triage Vital Signs: ED Triage Vitals [11/13/22 1421]  Enc Vitals Group     BP 118/70     Pulse Rate 82     Resp 18     Temp 98.1 F (36.7 C)     Temp Source Oral     SpO2 98 %     Weight      Height      Head Circumference      Peak Flow      Pain Score 2     Pain Loc      Pain Edu?      Excl. in [place]?     Most recent vital signs: Vitals:   11/13/22 1421  BP: 118/70  Pulse: 82  Resp: 18  Temp: 98.1 F (36.7 C)  SpO2: 98%     General: Alert and in no acute distress.  Talking in complete sentences.  No evidence of shortness of breath. ENT:      Ears:        Nose: No congestion/rhinnorhea.      Mouth/Throat: Mucous membranes are moist.  No oropharyngeal erythema or edema.  No retained foreign body.  No signs of trauma. Neck: No stridor. No cervical spine tenderness to palpation. Hematological/Lymphatic/Immunilogical: No cervical lymphadenopathy. Cardiovascular:  Good peripheral perfusion Respiratory: Normal respiratory effort without tachypnea or retractions. Lungs CTAB. Good air entry to the bases with no decreased or absent breath sounds Gastrointestinal: Bowel sounds 4 quadrants. Soft and nontender to palpation. No guarding or rigidity. No palpable masses. No distention. No CVA tenderness. Musculoskeletal: Full range of motion to all extremities.  Neurologic:  No gross focal neurologic deficits are appreciated.  Skin:   No rash noted Other:   ED Results / Procedures / Treatments   Labs (all labs ordered are listed, but only abnormal results are displayed) Labs Reviewed  COMPREHENSIVE METABOLIC PANEL - Abnormal; Notable for the following components:      Result Value   Glucose, Bld 145 (*)  All other components within normal limits  CBC WITH DIFFERENTIAL/PLATELET - Abnormal; Notable for the following components:   WBC 3.7 (*)    All other components within normal limits     EKG     RADIOLOGY  CT scan performed at [facility] on the 20th is reviewed.  No acute findings on CT scan.  No results found.  PROCEDURES:  Critical Care performed: No  Procedures   MEDICATIONS ORDERED IN ED: Medications - No data to display   IMPRESSION / MDM / [place] / ED COURSE  I reviewed the triage vital signs and the nursing notes.                                 Differential diagnosis includes, but is not limited to, pharyngitis, retropharyngeal abscess, peritonsillar abscess, strep, laryngitis, epiglottitis, GERD, esophagitis, globus sensation  Patient's presentation is most consistent with acute presentation with  potential threat to life or bodily function.   Patient's diagnosis is consistent with globus sensation.  Patient presents emergency department complaining of a feeling that something is stuck in his throat.  Patient has been dealing with the symptoms for roughly a year, worsened over the last couple of weeks.  Patient has had multiple ED visits, has seen primary care, GI, ENT, pulmonology.  Patient has had a scope from ENT, assessment by pulmonology, barium swallow from GI.  He is currently scheduled for pulmonary function testing and endoscopy.  Patient has reassuring workups from the ED including labs and CT scan.  The patient at this time is taking Pepcid but does not appear to be taking Prilosec, Carafate, Cosamin, or Zyrtec that has apparently been prescribed.  There is no concerning findings on labs today.  Exam is reassuring.  Patient is talking in complete sentences, swallowing without difficulty.  I have recommended the patient take his prescribed medications, I will have the patient take Zyrtec.  Will prescribe a Magic mouthwash type medication for symptom improvement.  Patient already has follow-ups with both pulmonology for pulmonary function testing as well as with GI for endoscopy.  Keep these appointments.  Patient is stable for discharge.  Return precautions discussed with patient.  Patient is given ED precautions to return to the ED for any worsening or new symptoms.     FINAL CLINICAL IMPRESSION(S) / ED DIAGNOSES   Final diagnoses:  Globus sensation     Rx / DC Orders   ED Discharge Orders          Ordered    magic mouthwash w/lidocaine SOLN  4 times daily       Note to Pharmacy: Dispense in a 1/1/1 ratio. Use lidocaine, diphenhydramine, prednisolone   11/13/22 1715    cetirizine (ZYRTEC ALLERGY) 10 MG tablet  Daily        11/13/22 1715             Note:  This document was prepared using Dragon voice recognition software and may include unintentional dictation  errors.   Brynda Peon 11/13/22 1715    Lavonia Drafts, MD 11/13/22 1721

## 2022-11-13 NOTE — Telephone Encounter (Signed)
Patient called.  He has been to the ER twice (last night and on the 20th) for issues with swallowing, SOB, etc.  He said the ER has done nothing for him and he is concerned that he is still having the symptoms of feeling like food and liquid is not going completely down his throat.  He said his heart rate and blood pressure both were high.  He has an appointment with a pulmonologist today, wanted to keep you informed of what was going on, and see what your suggestions were to delve deeper into the cause of these issues.  Please call and advise.  Thank you.

## 2022-11-13 NOTE — Telephone Encounter (Signed)
Called and spoke with patient to offer him an appt for EGD this week with you. Pulmonology ordered a pulmonary function test for further evaluation of patient's SOB, that is not scheduled until 12/08/22. Pt states that he will cancel the PFT and proceed with the EGD instead. I told pt that if he is having SOB then that may put him at risk for complications during an EGD. I told pt that I would have you review note from Pulmonology and advise. Pt also states that he has a follow up with ENT tomorrow. Please advise, thanks.

## 2022-11-13 NOTE — ED Provider Triage Note (Signed)
Emergency Medicine Provider Triage Evaluation Note  Nicholas Johnson. , a 32 y.o. male  was evaluated in triage.  Pt complains of sore throat and chest tightness for months. Went to ED 1/17 and 1/20, also seen PCP, ENT, and GI. ENT did laryngoscopy which was normal. Had a barium swallow which was normal. Feels like he is gagging. Also reports that he has a "blockage in the back of his throat." No voice change. No fever.   Had CT neck normal. Barium swallow normal. CXR normal.  Has another scope with ENT tomorrow.  Has endoscopy with GI scheduled too.   Saw pulm today  Review of Systems  Positive: Globus sensation Negative: fever  Physical Exam  There were no vitals taken for this visit. Gen:   Awake, no distress   Resp:  Normal effort  MSK:   Moves extremities without difficulty  Other:    Medical Decision Making  Medically screening exam initiated at 2:18 PM.  Appropriate orders placed.  [name] was informed that the remainder of the evaluation will be completed by another provider, this initial triage assessment does not replace that evaluation, and the importance of remaining in the ED until their evaluation is complete.     Marquette Old, PA-C 11/13/22 1423

## 2022-11-13 NOTE — Telephone Encounter (Signed)
Inbound call from patient, stated he went to the ED this weekend, had imaging done but is still having pains. Patient is requesting to speak to a nurse.

## 2022-11-13 NOTE — ED Triage Notes (Signed)
Patient to ED for sore throat. Patient states at times it feels like his throat is closing after eating or talking. Patient states "it feels like I have a blockage in his throat causing some SOB." Seen for same in [place]. Told to follow up with ENT specialist- not able to follow up yet. Speaking in full complete symptoms. NAD noted.   Suppose to get a scope done tomorrow. Already had barium swallow. Saw pulmonologist today.

## 2022-11-13 NOTE — Telephone Encounter (Signed)
Neck CT and barium esophagram were negative. EGD to complete the work up however it is not urgent. Pulmonary evaluation needs to be completed prior to EGD/anesthesia.

## 2022-11-13 NOTE — Assessment & Plan Note (Signed)
Complaints are mainly upper airway. and I think nonallopathic. 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 Plan- Trial of Trelegy 100. Schedule PFT. Return prn.

## 2022-11-13 NOTE — ED Provider Triage Note (Signed)
Emergency Medicine Provider Triage Evaluation Note  Nicholas Johnson. , a 32 y.o. male  was evaluated in triage.  Pt complains of concerns for shortness of breath.  Also notes globus sensation.  Patient was evaluated several hours ago at [facility] emergency department for similar concerns.  However notes that he was not having trouble breathing with the chest tightness at that time.  Patient has a follow-up appointment with his ENT specialist tomorrow for a scope.  Review of Systems  Positive:  Negative:   Physical Exam  BP (!) 131/94   Pulse 79   Temp 98.3 F (36.8 C) (Oral)   Resp 20   SpO2 100%  Gen:   Awake, no distress   Resp:  Normal effort  MSK:   Moves extremities without difficulty  Other:  Uvula midline without swelling. No posterior pharyngeal erythema or tonsillar exudate noted. Patent airway. Pt able to speak in clear complete sentences. Tolerating oral secretions.  Medical Decision Making  Medically screening exam initiated at 6:45 PM.  Appropriate orders placed.  [name] was informed that the remainder of the evaluation will be completed by another provider, this initial triage assessment does not replace that evaluation, and the importance of remaining in the ED until their evaluation is complete.  Workup initiated   [name], PA-C 11/13/22 1855

## 2022-11-13 NOTE — ED Notes (Signed)
Pt notified registration staff that they were leaving 

## 2022-11-13 NOTE — Telephone Encounter (Signed)
This is a duplicate message. See 1/22 telephone encounter.

## 2022-11-13 NOTE — Telephone Encounter (Signed)
Called and spoke with patient. I was able to get him scheduled with Dr. Annamaria Boots at 11:30 today. Dr. Annamaria Boots and patient are aware. Nothing further needed.

## 2022-11-14 ENCOUNTER — Other Ambulatory Visit: Payer: Self-pay

## 2022-11-14 ENCOUNTER — Emergency Department (HOSPITAL_COMMUNITY): Payer: 59

## 2022-11-14 ENCOUNTER — Telehealth (HOSPITAL_BASED_OUTPATIENT_CLINIC_OR_DEPARTMENT_OTHER): Payer: Self-pay | Admitting: Family Medicine

## 2022-11-14 ENCOUNTER — Emergency Department (HOSPITAL_COMMUNITY)
Admission: EM | Admit: 2022-11-14 | Discharge: 2022-11-15 | Discharge status: Against Medical Advice | Payer: 59 | Source: Home / Self Care

## 2022-11-14 ENCOUNTER — Encounter (HOSPITAL_COMMUNITY): Payer: Self-pay

## 2022-11-14 ENCOUNTER — Other Ambulatory Visit (HOSPITAL_COMMUNITY): Payer: 59

## 2022-11-14 ENCOUNTER — Emergency Department (HOSPITAL_COMMUNITY)
Admission: EM | Admit: 2022-11-14 | Discharge: 2022-11-14 | Disposition: A | Discharge status: Home / Self Care | Payer: 59 | Attending: Emergency Medicine | Admitting: Emergency Medicine

## 2022-11-14 DIAGNOSIS — R079 Chest pain, unspecified: Secondary | ICD-10-CM | POA: Insufficient documentation

## 2022-11-14 DIAGNOSIS — R069 Unspecified abnormalities of breathing: Secondary | ICD-10-CM | POA: Diagnosis not present

## 2022-11-14 DIAGNOSIS — R0789 Other chest pain: Secondary | ICD-10-CM | POA: Diagnosis not present

## 2022-11-14 DIAGNOSIS — R09A2 Foreign body sensation, throat: Secondary | ICD-10-CM

## 2022-11-14 DIAGNOSIS — R0981 Nasal congestion: Secondary | ICD-10-CM | POA: Insufficient documentation

## 2022-11-14 DIAGNOSIS — R0989 Other specified symptoms and signs involving the circulatory and respiratory systems: Secondary | ICD-10-CM | POA: Insufficient documentation

## 2022-11-14 DIAGNOSIS — J392 Other diseases of pharynx: Secondary | ICD-10-CM | POA: Diagnosis not present

## 2022-11-14 DIAGNOSIS — Z91010 Allergy to peanuts: Secondary | ICD-10-CM | POA: Insufficient documentation

## 2022-11-14 DIAGNOSIS — Z5321 Procedure and treatment not carried out due to patient leaving prior to being seen by health care provider: Secondary | ICD-10-CM | POA: Insufficient documentation

## 2022-11-14 DIAGNOSIS — K219 Gastro-esophageal reflux disease without esophagitis: Secondary | ICD-10-CM | POA: Diagnosis not present

## 2022-11-14 DIAGNOSIS — R0602 Shortness of breath: Secondary | ICD-10-CM | POA: Insufficient documentation

## 2022-11-14 DIAGNOSIS — J3089 Other allergic rhinitis: Secondary | ICD-10-CM | POA: Diagnosis not present

## 2022-11-14 DIAGNOSIS — R69 Illness, unspecified: Secondary | ICD-10-CM | POA: Diagnosis not present

## 2022-11-14 LAB — TROPONIN I (HIGH SENSITIVITY): Troponin I (High Sensitivity): 7 ng/L (ref <18)

## 2022-11-14 MED ORDER — SUCRALFATE 1 G PO TABS: ORAL_TABLET | ORAL | 1 refills | Status: DC

## 2022-11-14 NOTE — ED Triage Notes (Signed)
Patient has been seen several times for same. Left early yesterday bc he was hungry.

## 2022-11-14 NOTE — Discharge Instructions (Addendum)
You have been seen today for your complaint of difficulty catching your breath. Your lab work from yesterday was normal. Your imaging from recent visits was normal. Follow up with: your specialists as scheduled Please seek immediate medical care if you develop any of the following symptoms: Your symptoms get worse. You have throat pain. It is hard to swallow. Food or liquid comes back up into your mouth. You lose weight without trying. At this time there does not appear to be the presence of an emergent medical condition, however there is always the potential for conditions to change. Please read and follow the below instructions.  Do not take your medicine if  develop an itchy rash, swelling in your mouth or lips, or difficulty breathing; call 911 and seek immediate emergency medical attention if this occurs.  You may review your lab tests and imaging results in their entirety on your MyChart account.  Please discuss all results of fully with your primary care provider and other specialist at your follow-up visit.  Note: Portions of this text may have been transcribed using voice recognition software. Every effort was made to ensure accuracy; however, inadvertent computerized transcription errors may still be present.

## 2022-11-14 NOTE — ED Provider Triage Note (Signed)
Emergency Medicine Provider Triage Evaluation Note  Nicholas Johnson. , a 32 y.o. male  was evaluated in triage.  Pt complains of shortness of breath and globus sensation.  Has been seen numerous times for this recently.  Has been seen by GI and has an ENT appointment at 115 today.  Called EMS this morning because he felt like he could not catch his breath due to feeling like his throat was closing.  Also complains of pain pretty much everywhere else  Review of Systems  Positive: As above Negative: As above  Physical Exam  BP 123/87 (BP Location: Right Arm)   Pulse 94   Temp 98.8 F (37.1 C) (Oral)   Resp 16   Ht 5\' 8"  (1.727 m)   Wt 58.1 kg   SpO2 100%   BMI 19.46 kg/m  Gen:   Awake, no distress   Resp:  Normal effort  MSK:   Moves extremities without difficulty  Other:    Medical Decision Making  Medically screening exam initiated at 11:27 AM.  Appropriate orders placed.  Nicholas Johnson. was informed that the remainder of the evaluation will be completed by another provider, this initial triage assessment does not replace that evaluation, and the importance of remaining in the ED until their evaluation is complete.  Was seen most recently yesterday for similar symptoms   Nicholas Johnson, Nicholas Johnson 11/14/22 1128

## 2022-11-14 NOTE — ED Triage Notes (Signed)
EMS stated he has some breathing problems for over a year. Pt ststed, I have congestion. He has an appt today at 100pm. Pt was a pt at the hospital yesterday with the same symptoms. Pt was able to ambulate to the bathroom without any problem;

## 2022-11-14 NOTE — Telephone Encounter (Signed)
Pt called into the office again. He states that his appetite has not been well and he is having a hard time eating solid foods because he feels like it just gets stuck in his chest. Pt tolerates liquids fine. I told pt to try Boost or Ensure to make sure he is maintaining his caloric intake. I advised pt that he can also try sucking on Altoid's since he has the globus sensation, not sure how well that will work. Pt also states that he moved up his PFT appt to 11/20/22 which is the soonest appt. Pt states that he will go back to ED if he has to. I told pt that I will send you this additional note.

## 2022-11-14 NOTE — Telephone Encounter (Signed)
PT calling again with the same issues and questions. Pls call. Third call to me.   Pls call him @ [phone]

## 2022-11-14 NOTE — Telephone Encounter (Signed)
Inbound call from patient, is requesting to have an US done since his CT was not able to get approved. Please advise.

## 2022-11-14 NOTE — ED Triage Notes (Signed)
Pt BIB EMS with c/o SOB and CP that has been ongoing. Pt seen for the same multiple times. Pt checked in earlier but left before being seen. Pt speaking in full sentences in triage.

## 2022-11-14 NOTE — Telephone Encounter (Signed)
See telephone encounter from 11/13/22.

## 2022-11-14 NOTE — Telephone Encounter (Signed)
Pt called into the office again. Pt states that he wanted to update Amy on what is going on. He states that he saw ENT today and they did not find any cause for his symptoms. Pt reports that he has been to the ED over 10 times in 2 weeks but he is not staying long enough to be evaluated by a provider. I again advised pt that the next step would be an EGD but he would need to be cleared by pulmonology. Pt's PFT is not scheduled until mid February. He is going to see if he can get this moved up. I advised pt to call back after his PFT has been completed. Pt verbalized understanding.

## 2022-11-14 NOTE — ED Notes (Signed)
Patient reports his only way of transportation is EMS.  Had an appt with pulm and is scheduling him for a pulmonary function test.

## 2022-11-14 NOTE — Telephone Encounter (Signed)
PT calling again stating he wants to speak with someone about issues with breathing out his nose. He has already made several calls here to [facility] and myself. PFT sched and he is under the care of an ENT Dr.. I tried to advise I am just clerical, not clinical, but he still wanted to tell me all his issues. Pls call to advise.   [phone]

## 2022-11-14 NOTE — ED Provider Triage Note (Signed)
Emergency Medicine Provider Triage Evaluation Note  Nicholas Johnson. , Johnson 32 y.o. male  was evaluated in triage.  Pt complains of shortness of breath x 2 weeks. Has chest pain occurring while in the ED. patient has been evaluated multiple times for similar concerns. Was evaluated by ENT and at that time ENT recommended that he does can maintain his follow-up with the GI specialist for an endoscopy.  ENT was unable to find anything during their visit today.  Review of Systems  Positive:  Negative:   Physical Exam  BP 117/75   Pulse 85   Temp 98.8 F (37.1 C)   Resp 15   Ht 5\' 8"  (1.727 m)   Wt 58.1 kg   SpO2 100%   BMI 19.46 kg/m  Gen:   Awake, no distress able to speak in clear complete sentences without difficulty Resp:  Normal effort  MSK:   Moves extremities without difficulty  Other:  No chest wall tenderness to palpation  Medical Decision Making  Medically screening exam initiated at 8:23 PM.  Appropriate orders placed.  Nicholas Johnson. was informed that the remainder of the evaluation will be completed by another provider, this initial triage assessment does not replace that evaluation, and the importance of remaining in the ED until their evaluation is complete.  Workup initiated   Nicholas Amedee A, PA-C 11/14/22 2023

## 2022-11-14 NOTE — Telephone Encounter (Signed)
Pt left a voicemail called back. Pt stated he went to specialty offices. Stated he just wanted to tell CMA that. Then presented to ask about FMLA and what she should do bout what is going on. I told the pt that was said by provider and relayed to him on 11/10/21. Pt has called CMA many times about this relating the same information. I stated to him again that was stated. He showed understanding.

## 2022-11-14 NOTE — ED Provider Notes (Signed)
[facility] Provider Note   CSN: [id number] Arrival date & time: 11/14/22  1032     History  Chief Complaint  Patient presents with   Shortness of Breath    Nicholas Johnson. is a 32 y.o. male.  With a history of psychosis who presents to the ED for evaluation of "shortness of breath."  He states he has had a globus sensation for at least the past year.  Has been seen numerous times for this, most recently yesterday as [facility].  He states shortly after waking up this morning he felt like he could not catch his breath and began hyperventilating due to the globus sensation.  This caused him to call EMS and be transported here.  He has been seen by GI for this and has had multiple ED workups including CT soft tissue neck 3 days ago and labs with each visit since then.  He has an appointment with ENT today.  On my evaluation, he denies any shortness of breath but states that he has intermittent pain all over and nasal congestion.   Shortness of Breath      Home Medications Prior to Admission medications   Medication Sig Start Date End Date Taking? Authorizing Provider  cetirizine (ZYRTEC ALLERGY) 10 MG tablet Take 1 tablet (10 mg total) by mouth daily. 11/13/22 11/13/23  Cuthriell, Charline Bills, PA-C  Fluticasone-Umeclidin-Vilant (TRELEGY ELLIPTA) 100-62.5-25 MCG/ACT AEPB Inhale 1 puff into the lungs daily. 11/13/22   Deneise Lever, MD  hyoscyamine (LEVSIN SL) 0.125 MG SL tablet 1 tablet by mouth 30 minutes before meals 3 times a day 11/09/22   Esterwood, Amy S, PA-C  ibuprofen (ADVIL) 800 MG tablet Take 1 tablet (800 mg total) by mouth 3 (three) times daily. 03/05/22   Charlesetta Shanks, MD  lamoTRIgine (LAMICTAL) 100 MG tablet Take 1 tablet (100 mg total) by mouth daily. 11/09/22   Marcial Pacas, MD  lamoTRIgine (LAMICTAL) 25 MG tablet One tab qhs xone week 2 tabs qhs xone week 3 tabs qhs xone week 11/09/22   Marcial Pacas, MD  magic  mouthwash w/lidocaine SOLN Take 5 mLs by mouth 4 (four) times daily. 11/13/22   Cuthriell, Charline Bills, PA-C  omeprazole (PRILOSEC) 20 MG capsule Take 1 capsule (20 mg total) by mouth 2 (two) times daily before a meal for 14 days. 11/02/22 0/93/23  [name] Stall P, DO  sucralfate (CARAFATE) 1 GM/10ML suspension 1 gram by mouth between meals and at bedtime 11/09/22   Esterwood, Amy S, PA-C  SUMAtriptan (IMITREX) 50 MG tablet May repeat in 2 hours if headache persists or recurs. 11/09/22   Marcial Pacas, MD      Allergies    Peanut-containing drug products and Wheat bran    Review of Systems   Review of Systems  Respiratory:  Positive for shortness of breath.   All other systems reviewed and are negative.   Physical Exam Updated Vital Signs BP 125/72 (BP Location: Right Arm)   Pulse 90   Temp 98.7 F (37.1 C) (Oral)   Resp 16   Ht 5\' 8"  (1.727 m)   Wt 58.1 kg   SpO2 100%   BMI 19.46 kg/m  Physical Exam Vitals and nursing note reviewed.  Constitutional:      General: He is not in acute distress.    Appearance: He is well-developed.  HENT:     Head: Normocephalic and atraumatic.     Mouth/Throat:  Mouth: Mucous membranes are moist.     Pharynx: Oropharynx is clear. No pharyngeal swelling or oropharyngeal exudate.  Eyes:     Conjunctiva/sclera: Conjunctivae normal.  Cardiovascular:     Rate and Rhythm: Normal rate and regular rhythm.     Heart sounds: No murmur heard. Pulmonary:     Effort: Pulmonary effort is normal. No respiratory distress.     Breath sounds: Normal breath sounds. No decreased breath sounds, wheezing, rhonchi or rales.  Abdominal:     Palpations: Abdomen is soft.     Tenderness: There is no abdominal tenderness.  Musculoskeletal:        General: No swelling.     Cervical back: Neck supple.  Skin:    General: Skin is warm and dry.     Capillary Refill: Capillary refill takes less than 2 seconds.  Neurological:     Mental Status: He is alert.   Psychiatric:        Mood and Affect: Mood normal.     ED Results / Procedures / Treatments   Labs (all labs ordered are listed, but only abnormal results are displayed) Labs Reviewed - No data to display  EKG None  Radiology No results found.  Procedures Procedures    Medications Ordered in ED Medications - No data to display  ED Course/ Medical Decision Making/ A&P                             Medical Decision Making This patient presents to the ED for concern of shortness of breath, this involves an extensive number of treatment options, and is a complaint that carries with it a high risk of complications and morbidity.  The emergent differential diagnosis for shortness of breath includes, but is not limited to, Pulmonary edema, bronchoconstriction, Pneumonia, Pulmonary embolism, Pneumotherax/ Hemothorax, Dysrythmia, ACS.    Patient appears very anxious on exam, this may be contributing to his symptoms.  He is requesting to be discharged shortly after my examination because he wants to make it to his ENT appointment.  Additional history obtained from: Nursing notes from this visit. ED visit for same yesterday, 11/12/2022, 11/11/2022, 11/06/2022, 11/03/2022, 11/02/2022  Afebrile, hemodynamically stable.  No respiratory distress.  32 year old male presenting to the ED for evaluation of globus sensation causing difficulty catching his breath.  He has been seen and worked up numerous times for this recently.  He has been seen by GI and states that he is scheduled for an endoscopy in the future.  He has an ENT appointment later today.  He is requesting discharge so he can make it to his ENT appointment.  Since he has not had any respiratory distress and he appears overall very well, believe this is appropriate.  He may have a psychiatric component to his symptoms and follow-up with specialist is likely going to be in his best interest.  He was given return precautions.  Stable at  discharge.  At this time there does not appear to be any evidence of an acute emergency medical condition and the patient appears stable for discharge with appropriate outpatient follow up. Diagnosis was discussed with patient who verbalizes understanding of care plan and is agreeable to discharge. I have discussed return precautions with patient who verbalizes understanding. Patient encouraged to follow-up with their PCP within 1 week. All questions answered.  Patient's case discussed with Dr. Rush Landmark who agrees with plan to discharge with follow-up.   Note:  Portions of this report may have been transcribed using voice recognition software. Every effort was made to ensure accuracy; however, inadvertent computerized transcription errors may still be present.         Final Clinical Impression(s) / ED Diagnoses Final diagnoses:  Globus sensation    Rx / DC Orders ED Discharge Orders     None         Roylene Reason, Hershal Coria 11/14/22 1402    Tegeler, Gwenyth Allegra, MD 11/14/22 [phone]

## 2022-11-15 ENCOUNTER — Other Ambulatory Visit: Payer: Self-pay

## 2022-11-15 ENCOUNTER — Telehealth: Payer: Self-pay | Admitting: Physician Assistant

## 2022-11-15 LAB — TROPONIN I (HIGH SENSITIVITY): Troponin I (High Sensitivity): 7 ng/L (ref <18)

## 2022-11-15 NOTE — Telephone Encounter (Signed)
Letter available for the patient.

## 2022-11-15 NOTE — Telephone Encounter (Signed)
Spoke with the patient. I told him I would look into this.

## 2022-11-15 NOTE — Telephone Encounter (Signed)
Spoke with the patient. He agrees to a Pre-Visit Friday. He is advised this appointment is for instructions and signing of the consent form. Patient will ask his parents to be his care partner for an [facility] appointment on 11/22/22. He was concerned about his parents and their jobs. Appointment on 11/22/22 is at [place]. Patient's PFT appointment with pulmonology is Monday 11/20/22.

## 2022-11-15 NOTE — ED Notes (Signed)
Pt states they are leaving

## 2022-11-15 NOTE — Telephone Encounter (Signed)
Inbound call from patient stating he uploaded his fmla paperwork into his MyChart

## 2022-11-15 NOTE — Telephone Encounter (Signed)
Inbound call from patient stating that his place of employment is needing FMLA paperwork filled out. Patient stated that he asked his ENT to do it and they advised him that Gertie Fey would be the ones to fill that information out. Patient is requesting a call back to discuss. Please advise.

## 2022-11-16 ENCOUNTER — Other Ambulatory Visit: Payer: Self-pay

## 2022-11-16 ENCOUNTER — Emergency Department
Admission: EM | Admit: 2022-11-16 | Discharge: 2022-11-16 | Disposition: A | Discharge status: Home / Self Care | Payer: 59 | Attending: Student in an Organized Health Care Education/Training Program | Admitting: Student in an Organized Health Care Education/Training Program

## 2022-11-16 ENCOUNTER — Telehealth: Payer: Self-pay | Admitting: Internal Medicine

## 2022-11-16 ENCOUNTER — Telehealth: Payer: Self-pay | Admitting: Physician Assistant

## 2022-11-16 ENCOUNTER — Other Ambulatory Visit (HOSPITAL_COMMUNITY): Payer: 59

## 2022-11-16 ENCOUNTER — Inpatient Hospital Stay: Payer: 59 | Admitting: Internal Medicine

## 2022-11-16 ENCOUNTER — Emergency Department
Admission: EM | Admit: 2022-11-16 | Discharge: 2022-11-16 | Disposition: A | Discharge status: Home / Self Care | Payer: 59 | Source: Home / Self Care | Attending: Emergency Medicine | Admitting: Emergency Medicine

## 2022-11-16 ENCOUNTER — Inpatient Hospital Stay: Payer: 59 | Admitting: Emergency Medicine

## 2022-11-16 ENCOUNTER — Encounter: Payer: Self-pay | Admitting: Emergency Medicine

## 2022-11-16 ENCOUNTER — Emergency Department: Payer: 59

## 2022-11-16 DIAGNOSIS — R0981 Nasal congestion: Secondary | ICD-10-CM | POA: Insufficient documentation

## 2022-11-16 DIAGNOSIS — R0600 Dyspnea, unspecified: Secondary | ICD-10-CM | POA: Diagnosis not present

## 2022-11-16 DIAGNOSIS — Z8616 Personal history of COVID-19: Secondary | ICD-10-CM | POA: Diagnosis not present

## 2022-11-16 DIAGNOSIS — R0602 Shortness of breath: Secondary | ICD-10-CM | POA: Insufficient documentation

## 2022-11-16 DIAGNOSIS — Z1152 Encounter for screening for COVID-19: Secondary | ICD-10-CM | POA: Diagnosis not present

## 2022-11-16 DIAGNOSIS — R0789 Other chest pain: Secondary | ICD-10-CM | POA: Diagnosis not present

## 2022-11-16 LAB — COMPREHENSIVE METABOLIC PANEL
ALT: 12 U/L (ref 0–44)
AST: 20 U/L (ref 15–41)
Albumin: 5.1 g/dL — ABNORMAL HIGH (ref 3.5–5.0)
Alkaline Phosphatase: 41 U/L (ref 38–126)
Anion gap: 10 (ref 5–15)
BUN: 9 mg/dL (ref 6–20)
CO2: 24 mmol/L (ref 22–32)
Calcium: 9.9 mg/dL (ref 8.9–10.3)
Chloride: 106 mmol/L (ref 98–111)
Creatinine, Ser: 0.81 mg/dL (ref 0.61–1.24)
GFR, Estimated: >60 mL/min (ref >60)
Glucose, Bld: 90 mg/dL (ref 70–99)
Potassium: 3.8 mmol/L (ref 3.5–5.1)
Sodium: 140 mmol/L (ref 135–145)
Total Bilirubin: 0.7 mg/dL (ref 0.3–1.2)
Total Protein: 8.3 g/dL — ABNORMAL HIGH (ref 6.5–8.1)

## 2022-11-16 LAB — PULMONARY FUNCTION TEST
FEF 25-75 Pre: 3.88 L/sec
FEF2575-%Pred-Pre: 90 %
FEV1-%Pred-Pre: 92 %
FEV1-Pre: 3.88 L
FEV1FVC-%Pred-Pre: 98 %
FEV6-%Pred-Pre: 94 %
FEV6-Pre: 4.78 L
FEV6FVC-%Pred-Pre: 101 %
FVC-%Pred-Pre: 93 %
FVC-Pre: 4.79 L
Pre FEV1/FVC ratio: 81 %
Pre FEV6/FVC Ratio: 100 %

## 2022-11-16 LAB — CBC
HCT: 42 % (ref 39.0–52.0)
Hemoglobin: 14.6 g/dL (ref 13.0–17.0)
MCH: 27.9 pg (ref 26.0–34.0)
MCHC: 34.8 g/dL (ref 30.0–36.0)
MCV: 80.2 fL (ref 80.0–100.0)
Platelets: 203 10*3/uL (ref 150–400)
RBC: 5.24 MIL/uL (ref 4.22–5.81)
RDW: 12.2 % (ref 11.5–15.5)
WBC: 5.1 10*3/uL (ref 4.0–10.5)
nRBC: 0 % (ref 0.0–0.2)

## 2022-11-16 LAB — TROPONIN I (HIGH SENSITIVITY): Troponin I (High Sensitivity): 6 ng/L (ref <18)

## 2022-11-16 LAB — RESP PANEL BY RT-PCR (RSV, FLU A&B, COVID)  RVPGX2
Influenza A by PCR: NEGATIVE
Influenza B by PCR: NEGATIVE
Resp Syncytial Virus by PCR: NEGATIVE
SARS Coronavirus 2 by RT PCR: NEGATIVE

## 2022-11-16 MED ORDER — IPRATROPIUM-ALBUTEROL 0.5-2.5 (3) MG/3ML IN SOLN
3.0000 mL | Freq: Once | RESPIRATORY_TRACT | Status: AC
  Administered 2022-11-16: 3 mL via RESPIRATORY_TRACT
  Filled 2022-11-16: qty 3

## 2022-11-16 MED ORDER — FLUTICASONE PROPIONATE 50 MCG/ACT NA SUSP
2.0000 | NASAL | Status: AC
  Administered 2022-11-16: 2 via NASAL
  Filled 2022-11-16: qty 16

## 2022-11-16 MED ORDER — HYDROXYZINE HCL 25 MG PO TABS
25.0000 mg | ORAL_TABLET | Freq: Once | ORAL | Status: DC
  Filled 2022-11-16: qty 1

## 2022-11-16 MED ORDER — ALBUTEROL SULFATE HFA 108 (90 BASE) MCG/ACT IN AERS: 2.0000 | INHALATION_SPRAY | Freq: Four times a day (QID) | RESPIRATORY_TRACT | 2 refills | Status: DC | PRN

## 2022-11-16 MED ORDER — FLUTICASONE PROPIONATE 50 MCG/ACT NA SUSP: 1.0000 | Freq: Every day | NASAL | 0 refills | Status: DC

## 2022-11-16 NOTE — ED Provider Notes (Signed)
[facility] Provider Note    Event Date/Time   First MD Initiated Contact with Patient 11/16/22 2111     (approximate)   History   Chief Complaint: Anxiety   HPI  Nicholas Johnson. is a 32 y.o. male with a past history of psychosis, migraine and chronic nasal congestion who comes ED complaining of nasal congestion.  He was recently prescribed Trelegy Ellipta 2 days ago by his doctor but has not started that yet.  Has been taking Allegra sporadically at home.  Denies fever, chest pain or shortness of breath.  No difficulty swallowing or breathing.  Patient has been seen multiple times recently for similar symptoms without significant findings.     Physical Exam   Triage Vital Signs: ED Triage Vitals  Enc Vitals Group     BP 11/16/22 1702 119/69     Pulse Rate 11/16/22 1702 86     Resp 11/16/22 1702 17     Temp 11/16/22 1702 97.8 F (36.6 C)     Temp Source 11/16/22 1702 Oral     SpO2 11/16/22 1702 100 %     Weight 11/16/22 1703 125 lb (56.7 kg)     Height --      Head Circumference --      Peak Flow --      Pain Score 11/16/22 1702 8     Pain Loc --      Pain Edu? --      Excl. in [place]? --     Most recent vital signs: Vitals:   11/16/22 2300 11/16/22 2351  BP: 121/73 121/71  Pulse: 68 70  Resp: 18 18  Temp:  97.8 F (36.6 C)  SpO2: 100% 100%    General: Awake, no distress.  CV:  Good peripheral perfusion.  Regular rate and rhythm Resp:  Normal effort.  Clear to auscultation bilaterally Abd:  No distention.  Other:  Normal oropharynx.  No lymphadenopathy.  No tongue swelling or elevation.   ED Results / Procedures / Treatments   Labs (all labs ordered are listed, but only abnormal results are displayed) Labs Reviewed - No data to display   EKG Interpreted by me Normal sinus rhythm rate of 62.  Normal axis, normal intervals.  Normal QRS ST segments and T waves.   BER   RADIOLOGY    PROCEDURES:  Procedures   MEDICATIONS ORDERED IN ED: Medications  fluticasone (FLONASE) 50 MCG/ACT nasal spray 2 spray (2 sprays Each Nare Given 11/16/22 2248)     IMPRESSION / MDM / ASSESSMENT AND PLAN / ED COURSE  I reviewed the triage vital signs and the nursing notes.                              Differential diagnosis includes, but is not limited to, allergic rhinitis, viral illness.  Anxiety  Patient's presentation is most consistent with exacerbation of chronic illness.  Patient presents with persistent complaints about nasal congestion primarily.  Also has a fixation on problems with breathing which she is very anxious about, but breathing is unlabored, auscultation is normal, vitals are normal.  Doubt ACS PE pneumonia dissection pericardial effusion.  Counseled patient to start using the Trelegy Ellipta that his doctor prescribed.  Also advised taking Allegra consistently on a daily basis.  Can also do a short trial of Flonase spray.       FINAL CLINICAL IMPRESSION(S) / ED DIAGNOSES  Final diagnoses:  Nasal congestion     Rx / DC Orders   ED Discharge Orders          Ordered    fluticasone (FLONASE) 50 MCG/ACT nasal spray  Daily        11/16/22 2204             Note:  This document was prepared using Dragon voice recognition software and may include unintentional dictation errors.   Carrie Mew, MD 11/17/22 [phone]

## 2022-11-16 NOTE — ED Triage Notes (Signed)
Pt was recently seen in the ED a few hours ago. Pt sts that he is having chest pain and SOB. Pt is very anxious in triage. Pt is crying and sts " My chest is just not opening." Pt continues to have tears roll down his face.

## 2022-11-16 NOTE — ED Notes (Signed)
See triage note  Presents some congestion and feels SOB  Afebrile on arrival

## 2022-11-16 NOTE — Telephone Encounter (Signed)
We can change order from PFT to spirometry - cheaper and quicker than PFT and it would give [place] some information, but not as much as a full PFT would.

## 2022-11-16 NOTE — ED Notes (Signed)
EKG given to provider for review

## 2022-11-16 NOTE — Telephone Encounter (Signed)
Spoke to pt and he stated he has some congestion and wanted to know if that would be ok with having his PFT done today. I answered his question also informed his of Dr Janee Morn input. Pt verbalized understanding, nothing further needed.

## 2022-11-16 NOTE — ED Provider Notes (Incomplete)
   [facility] Provider Note    Event Date/Time   First MD Initiated Contact with Patient 11/16/22 2111     (approximate)   History   Chief Complaint: Anxiety   HPI  Nicholas Tibbs. is a 32 y.o. male with a past history of psychosis, migraine and chronic nasal congestion who comes ED complaining of nasal congestion.  He was recently prescribed Trelegy Ellipta 2 days ago by his doctor but has not started that yet.  Has been taking Allegra sporadically at home.  Denies fever, chest pain or shortness of breath.  No difficulty swallowing or breathing.  Patient has been seen multiple times recently for similar symptoms without significant findings.     Physical Exam   Triage Vital Signs: ED Triage Vitals  Enc Vitals Group     BP 11/16/22 1702 119/69     Pulse Rate 11/16/22 1702 86     Resp 11/16/22 1702 17     Temp 11/16/22 1702 97.8 F (36.6 C)     Temp Source 11/16/22 1702 Oral     SpO2 11/16/22 1702 100 %     Weight 11/16/22 1703 125 lb (56.7 kg)     Height --      Head Circumference --      Peak Flow --      Pain Score 11/16/22 1702 8     Pain Loc --      Pain Edu? --      Excl. in [place]? --     Most recent vital signs: Vitals:   11/16/22 1702  BP: 119/69  Pulse: 86  Resp: 17  Temp: 97.8 F (36.6 C)  SpO2: 100%    General: Awake, no distress. *** CV:  Good peripheral perfusion. *** Resp:  Normal effort. *** Abd:  No distention. *** Other:  ***   ED Results / Procedures / Treatments   Labs (all labs ordered are listed, but only abnormal results are displayed) Labs Reviewed - No data to display   EKG ***   RADIOLOGY ***   PROCEDURES:  Procedures   MEDICATIONS ORDERED IN ED: Medications  fluticasone (FLONASE) 50 MCG/ACT nasal spray 2 spray (has no administration in time range)     IMPRESSION / MDM / ASSESSMENT AND PLAN / ED COURSE  I reviewed the triage vital signs and the nursing notes.                               Differential diagnosis includes, but is not limited to, ***  Patient's presentation is most consistent with {EM COPA:27473}  ***       FINAL CLINICAL IMPRESSION(S) / ED DIAGNOSES   Final diagnoses:  None     Rx / DC Orders   ED Discharge Orders          Ordered    fluticasone (FLONASE) 50 MCG/ACT nasal spray  Daily        11/16/22 2204             Note:  This document was prepared using Dragon voice recognition software and may include unintentional dictation errors.

## 2022-11-16 NOTE — ED Notes (Signed)
Pt refusing to take medication at this time. Stating that he will wait until after he sees the doctor to take any medications. Pt A&Ox4 at time of triage. Appearing anxious. NAD.

## 2022-11-16 NOTE — ED Provider Triage Note (Signed)
Emergency Medicine Provider Triage Evaluation Note  Nicholas Johnson. , a 32 y.o. male  was evaluated in triage.  Pt complains of shortness of breath after attempting to complete a PFT. He had an appointment with ENT this afternoon, but didn't go because he was feeling short of breath.    Physical Exam  BP 138/75 (BP Location: Left Arm)   Pulse 69   Temp 98 F (36.7 C) (Oral)   Resp 20   Ht 5\' 8"  (1.727 m)   Wt 56.7 kg   SpO2 100%   BMI 19.01 kg/m  Gen:   Awake, no distress   Resp:  Normal effort  MSK:   Moves extremities without difficulty  Other:    Medical Decision Making  Medically screening exam initiated at 1:02 PM.  Appropriate orders placed.  [name] was informed that the remainder of the evaluation will be completed by another provider, this initial triage assessment does not replace that evaluation, and the importance of remaining in the ED until their evaluation is complete.     Victorino Dike, FNP 11/16/22 1421

## 2022-11-16 NOTE — ED Provider Triage Note (Signed)
Emergency Medicine Provider Triage Evaluation Note  Nicholas Johnson. , a 32 y.o. male  was evaluated in triage.  Pt complains of chest tightness and shortness of breath. Second visit here today. Discharged, but started feeling worse once he started walking around.  Physical Exam  There were no vitals taken for this visit. Gen:   Awake, no distress   Resp:  Normal effort  MSK:   Moves extremities without difficulty  Other:    Medical Decision Making  Medically screening exam initiated at 5:02 PM.  Appropriate orders placed.  Nicholas Johnson. was informed that the remainder of the evaluation will be completed by another provider, this initial triage assessment does not replace that evaluation, and the importance of remaining in the ED until their evaluation is complete.   Nicholas Dike, FNP 11/16/22 [phone]

## 2022-11-16 NOTE — ED Triage Notes (Signed)
Pt brought in with co shob and chest pain. States has had sinus congestion, states is trying to get in with pulmonologist for same.

## 2022-11-16 NOTE — Telephone Encounter (Signed)
Spoke to pt and informed pt of Dr Janee Morn recommendations but pt stated he was having SOB and went to the ER. Pt also stated that the hosp. just sent him home with an inhaler. So, he turned around and went right back to the ER and I was talking to pt while he was in the ER. Pt stated he needed to speak to a pulmonary doctor. I informed pt I will send a message to Dr Annamaria Boots and pt's questions will be answered the following day. Pt was adamant on speaking to a doctor here from the ER.

## 2022-11-16 NOTE — Telephone Encounter (Signed)
He was concerned about shortness of breath.  Pulmonary Function Test helps look at airflow to see if there is a problem in the lungs or airway. If he doesn't want to do it, that's ok.

## 2022-11-16 NOTE — ED Notes (Signed)
Per [facility],NP do need to complete labs or EKG at this time.

## 2022-11-16 NOTE — ED Provider Notes (Signed)
[facility] Provider Note    Event Date/Time   First MD Initiated Contact with Patient 11/16/22 1504     (approximate)   History   Shortness of Breath   HPI  Nicholas Johnson. is a 32 y.o. male with chief complaint of feeling short of breath over the past few weeks since he was sick with COVID.  No cough.  No chest pain.  States he feels like he cannot get a good deep breath.  Has followed up with ENT.  Was given referral to pulmonology and was post to have PFTs done today but was unable to complete them.  States that he does feel stressed out.     Physical Exam   Triage Vital Signs: ED Triage Vitals  Enc Vitals Group     BP 11/16/22 1300 138/75     Pulse Rate 11/16/22 1300 69     Resp 11/16/22 1300 20     Temp 11/16/22 1300 98 F (36.7 C)     Temp Source 11/16/22 1300 Oral     SpO2 11/16/22 1300 100 %     Weight 11/16/22 1258 125 lb (56.7 kg)     Height 11/16/22 1258 5\' 8"  (1.727 m)     Head Circumference --      Peak Flow --      Pain Score 11/16/22 1258 6     Pain Loc --      Pain Edu? --      Excl. in [place]? --     Most recent vital signs: Vitals:   11/16/22 1300  BP: 138/75  Pulse: 69  Resp: 20  Temp: 98 F (36.7 C)  SpO2: 100%     Constitutional: Alert  Eyes: Conjunctivae are normal.  Head: Atraumatic. Nose: No congestion/rhinnorhea. Mouth/Throat: Mucous membranes are moist.   Neck: Painless ROM.  Cardiovascular:   Good peripheral circulation.  No m/r/g Respiratory: Normal respiratory effort.  No retractions. Speaking in complete sentences.  No tachypnea Gastrointestinal: Soft and nontender.  Musculoskeletal:  no deformity Neurologic:  MAE spontaneously. No gross focal neurologic deficits are appreciated.  Skin:  Skin is warm, dry and intact. No rash noted. Psychiatric: Mood and affect are normal. Speech and behavior are normal.    ED Results / Procedures / Treatments   Labs (all labs ordered are listed, but only  abnormal results are displayed) Labs Reviewed  COMPREHENSIVE METABOLIC PANEL - Abnormal; Notable for the following components:      Result Value   Total Protein 8.3 (*)    Albumin 5.1 (*)    All other components within normal limits  RESP PANEL BY RT-PCR (RSV, FLU A&B, COVID)  RVPGX2  CBC  TROPONIN I (HIGH SENSITIVITY)  TROPONIN I (HIGH SENSITIVITY)     EKG  ED ECG REPORT I, Merlyn Lot, the attending physician, personally viewed and interpreted this ECG.   Date: 11/16/2022  EKG Time: 12:52  Rate: 60  Rhythm: sinus  Axis: normal  Intervals: normal qt  ST&T Change: ber, no stemi, no depressions       PROCEDURES:  Critical Care performed: No  Procedures   MEDICATIONS ORDERED IN ED: Medications  ipratropium-albuterol (DUONEB) 0.5-2.5 (3) MG/3ML nebulizer solution 3 mL (3 mLs Nebulization Given 11/16/22 1545)     IMPRESSION / MDM / ASSESSMENT AND PLAN / ED COURSE  I reviewed the triage vital signs and the nursing notes.  Differential diagnosis includes, but is not limited to, Asthma, copd, CHF, pna, ptx, malignancy, Pe, anemia  Patient presenting to the ER for evaluation of symptoms as described above.  Based on symptoms, risk factors and considered above differential, this presenting complaint could reflect a potentially life-threatening illness therefore the patient will be placed on continuous pulse oximetry and telemetry for monitoring.  Laboratory evaluation will be sent to evaluate for the above complaints.  Very anxious appearing and I do clinically suspect this is likely stress related.  Does appear organized no SI or HI.  Just recently had chest x-ray done 2 days ago.  Denies any worsening of symptoms.  Patient has deferred repeat imaging.  EKG nonischemic.  Blood work without any acute abnormalities no anemia.  Does not have any wheezing on exam.  Offered nebulizer treatment to see if that would give him some relief from  possible bronchitis but patient declining this stating they prefer follow-up albuterol as an outpatient.  He has no stridor.  Is low risk by Wells criteria is PERC negative.  Not consistent with pneumothorax.  Breath sounds are equal bilaterally.  Patient appropriate for outpatient workup.     FINAL CLINICAL IMPRESSION(S) / ED DIAGNOSES   Final diagnoses:  Dyspnea, unspecified type     Rx / DC Orders   ED Discharge Orders          Ordered    albuterol (VENTOLIN HFA) 108 (90 Base) MCG/ACT inhaler  Every 6 hours PRN        11/16/22 1621             Note:  This document was prepared using Dragon voice recognition software and may include unintentional dictation errors.    Merlyn Lot, MD 11/16/22 [phone]

## 2022-11-17 ENCOUNTER — Emergency Department (HOSPITAL_BASED_OUTPATIENT_CLINIC_OR_DEPARTMENT_OTHER)
Admission: EM | Admit: 2022-11-17 | Discharge: 2022-11-17 | Disposition: A | Discharge status: Home / Self Care | Payer: 59 | Attending: Emergency Medicine | Admitting: Emergency Medicine

## 2022-11-17 ENCOUNTER — Encounter: Payer: Self-pay | Admitting: Gastroenterology

## 2022-11-17 ENCOUNTER — Ambulatory Visit (AMBULATORY_SURGERY_CENTER): Payer: 59

## 2022-11-17 ENCOUNTER — Telehealth: Payer: Self-pay

## 2022-11-17 ENCOUNTER — Telehealth (HOSPITAL_BASED_OUTPATIENT_CLINIC_OR_DEPARTMENT_OTHER): Payer: Self-pay | Admitting: Internal Medicine

## 2022-11-17 ENCOUNTER — Encounter (HOSPITAL_BASED_OUTPATIENT_CLINIC_OR_DEPARTMENT_OTHER): Payer: Self-pay | Admitting: Emergency Medicine

## 2022-11-17 ENCOUNTER — Other Ambulatory Visit: Payer: Self-pay

## 2022-11-17 ENCOUNTER — Ambulatory Visit: Payer: Self-pay | Admitting: *Deleted

## 2022-11-17 ENCOUNTER — Telehealth: Payer: Self-pay | Admitting: Pulmonary Disease

## 2022-11-17 VITALS — Ht 68.0 in | Wt 124.0 lb

## 2022-11-17 DIAGNOSIS — Z91010 Allergy to peanuts: Secondary | ICD-10-CM | POA: Diagnosis not present

## 2022-11-17 DIAGNOSIS — R1313 Dysphagia, pharyngeal phase: Secondary | ICD-10-CM

## 2022-11-17 DIAGNOSIS — Z743 Need for continuous supervision: Secondary | ICD-10-CM | POA: Diagnosis not present

## 2022-11-17 DIAGNOSIS — R110 Nausea: Secondary | ICD-10-CM | POA: Diagnosis not present

## 2022-11-17 DIAGNOSIS — R09A2 Foreign body sensation, throat: Secondary | ICD-10-CM

## 2022-11-17 DIAGNOSIS — R1011 Right upper quadrant pain: Secondary | ICD-10-CM | POA: Insufficient documentation

## 2022-11-17 DIAGNOSIS — R1084 Generalized abdominal pain: Secondary | ICD-10-CM | POA: Diagnosis not present

## 2022-11-17 MED ORDER — ONDANSETRON HCL 4 MG/2ML IJ SOLN: 4.0000 mg | Freq: Once | INTRAMUSCULAR | Status: DC

## 2022-11-17 MED ORDER — KETOROLAC TROMETHAMINE 60 MG/2ML IM SOLN
60.0000 mg | Freq: Once | INTRAMUSCULAR | Status: AC
  Administered 2022-11-17: 60 mg via INTRAMUSCULAR
  Filled 2022-11-17: qty 2

## 2022-11-17 MED ORDER — KETOROLAC TROMETHAMINE 30 MG/ML IJ SOLN: 30.0000 mg | Freq: Once | INTRAMUSCULAR | Status: DC

## 2022-11-17 NOTE — ED Notes (Addendum)
This nurse called to room per pt request; states to secretary he is now experiencing tingling to hands and face.  When this nurse enters room pt sitting up in bed awake and alert; no obvious acute distress noted. Exaggerated inhaling and exhaling noted -- 100% O2 sats on RA; RR even and unlabored on RA however pt reporting he "can't breathe" -- states nasal congestion likely causing sob and pt points to throat stating he doesn't feel like air is moving properly (no mention of abd/flank pain as per pt complaint upon arrival). Lung sounds CTA; no stridor or gurgling noted; airway grossly patent.  Pt requests medication for congestion - states he has been taking Allegra and Sudafed with no relief; educated pt that provider will see him and determine appropriate plan of care.  Pt then states "oh ya not gonna do nothing for me" -- pt goes on to state that he shouldn't be left alone; educated pt that he is on cardiac and pulse ox continuous monitoring and bp is set to cycle Q34min -- explained that staff remotely monitors vitals from nurses station and this nurse unable to remain at bedside.  Reinforced to patient that provider will see patient as soon as possible and re-oriented patien to call bell.

## 2022-11-17 NOTE — Telephone Encounter (Signed)
Spoke with the patient. He speaks very calmly , clears his throat, sniffs and coughs through the entire call. He is able to complete all his sentences. Speaks in a low quiet relaxed sounding tone. Patient complains of chest "fullness" and then describes it as "tightness." States "my sinuses are blocked" and I am trying to just keep my airways open." Patient states "I don't know what is wrong, I just think my body is trying to tell me something."  Confirmed patient has care partner for EGD on Wednesday. He states he has spoken with his mother and she was to tell his father. He does plan on going through with the EGD. He also confirms he will go to the [facility] as scheduled 11/21/22 to image his neck around the thyroid.  I stated GI did not have anything to offer other than what he has already been instructed on. Encouraged him to take the medications/inhalers that he has been prescribed, to follow the recommendations given to him by the provider he has most recently seen (ER provider today) and to keep his appointments as scheduled. Attempted to reassure the patient.

## 2022-11-17 NOTE — Telephone Encounter (Signed)
Patient called requesting to speak with regarding what can be done stated he went to the ED today and they advised him to call us.

## 2022-11-17 NOTE — ED Provider Notes (Signed)
[facility] Provider Note   CSN: [id number] Arrival date & time: 11/17/22  0128     History  Chief Complaint  Patient presents with   Abdominal Pain   Flank Pain    Nicholas Johnson. is a 32 y.o. male.  The history is provided by the patient.  Abdominal Pain Flank Pain Associated symptoms include abdominal pain.  He has history of psychiatric disorder and comes in complaining right upper quadrant pain for the last 2-3 days.  Pain is associated with nausea and does radiate to the back.  He has not vomited.  He has also been seeing an ear nose throat physician regarding a sense of his air passages being closed off and he had a recent fiberoptic laryngoscopy which is reported to have been normal.  He is asking for medication to help clear his sinuses.  He has been told that he needs to be referred to a specialist at [facility].   Home Medications Prior to Admission medications   Medication Sig Start Date End Date Taking? Authorizing Provider  albuterol (VENTOLIN HFA) 108 (90 Base) MCG/ACT inhaler Inhale 2 puffs into the lungs every 6 (six) hours as needed for wheezing or shortness of breath. 11/16/22   Merlyn Lot, MD  cetirizine (ZYRTEC ALLERGY) 10 MG tablet Take 1 tablet (10 mg total) by mouth daily. 11/13/22 11/13/23  Cuthriell, Charline Bills, PA-C  fluticasone (FLONASE) 50 MCG/ACT nasal spray Place 1 spray into both nostrils daily. 11/16/22 11/16/23  Carrie Mew, MD  Fluticasone-Umeclidin-Vilant (TRELEGY ELLIPTA) 100-62.5-25 MCG/ACT AEPB Inhale 1 puff into the lungs daily. 11/13/22   Deneise Lever, MD  hyoscyamine (LEVSIN SL) 0.125 MG SL tablet 1 tablet by mouth 30 minutes before meals 3 times a day 11/09/22   Esterwood, Amy S, PA-C  ibuprofen (ADVIL) 800 MG tablet Take 1 tablet (800 mg total) by mouth 3 (three) times daily. 03/05/22   Charlesetta Shanks, MD  lamoTRIgine (LAMICTAL) 100 MG tablet Take 1 tablet (100 mg total) by mouth daily.  11/09/22   Marcial Pacas, MD  lamoTRIgine (LAMICTAL) 25 MG tablet One tab qhs xone week 2 tabs qhs xone week 3 tabs qhs xone week 11/09/22   Marcial Pacas, MD  magic mouthwash w/lidocaine SOLN Take 5 mLs by mouth 4 (four) times daily. 11/13/22   Cuthriell, Charline Bills, PA-C  omeprazole (PRILOSEC) 20 MG capsule Take 1 capsule (20 mg total) by mouth 2 (two) times daily before a meal for 14 days. 11/02/22 06/20/92  [name] Stall P, DO  sucralfate (CARAFATE) 1 g tablet dissolve in 10cc of water and take as a slurry between meals and bedtime 11/14/22   Doran Stabler, MD  SUMAtriptan (IMITREX) 50 MG tablet May repeat in 2 hours if headache persists or recurs. 11/09/22   Marcial Pacas, MD      Allergies    Peanut-containing drug products and Wheat bran    Review of Systems   Review of Systems  Gastrointestinal:  Positive for abdominal pain.  Genitourinary:  Positive for flank pain.  All other systems reviewed and are negative.   Physical Exam Updated Vital Signs BP (!) 132/91   Pulse 77   Temp 98.6 F (37 C) (Oral)   Resp 17   Ht 5\' 8"  (1.727 m)   Wt 56.7 kg   SpO2 100%   BMI 19.01 kg/m  Physical Exam Vitals and nursing note reviewed.   32 year old male, resting comfortably and in  no acute distress. Vital signs are significant for borderline elevated blood pressure. Oxygen saturation is 100%, which is normal. Head is normocephalic and atraumatic. PERRLA, EOMI. Oropharynx is clear. Neck is nontender and supple without adenopathy or JVD. Back is nontender and there is no CVA tenderness. Lungs are clear without rales, wheezes, or rhonchi. Chest is nontender. Heart has regular rate and rhythm without murmur. Abdomen is soft, flat, with mild right upper quadrant tenderness.  There is no rebound or guarding.  There are no masses or hepatosplenomegaly.  Peristalsis is present. Extremities have no cyanosis or edema, full range of motion is present. Skin is warm and dry without rash. Neurologic:  Mental status is normal, cranial nerves are intact, moves all extremities equally. Psychiatric: Appears very anxious.  ED Results / Procedures / Treatments   Labs (all labs ordered are listed, but only abnormal results are displayed) Labs Reviewed  COMPREHENSIVE METABOLIC PANEL  CBC WITH DIFFERENTIAL/PLATELET  LIPASE, BLOOD   Procedures Procedures  Cardiac monitor shows normal sinus rhythm, per my interpretation.  Medications Ordered in ED Medications  ketorolac (TORADOL) 30 MG/ML injection 30 mg (has no administration in time range)  ondansetron (ZOFRAN) injection 4 mg (has no administration in time range)    ED Course/ Medical Decision Making/ A&P                             Medical Decision Making Amount and/or Complexity of Data Reviewed Labs: ordered.  Risk Prescription drug management.   Right upper quadrant pain of uncertain cause.  Consider GERD, peptic ulcer disease, pancreatitis, cholecystitis, diverticulitis.  I have reviewed his past records, and I note office visit on 11/14/2022 with ear nose throat physician who performed fiberoptic laryngoscopy which was normal.  I also note ED visits on 11/11/2022, 11/12/2022, 11/13/2022 11/14/2022 for globus sensation or sense of shortness of breath.  He had an office visit with gastroenterology on 11/06/2022 for pharyngeal dysphagia.  He had an normal barium swallow on 11/08/2022, normal CT of the neck on 11/12/2022.  His exam today is benign, I do not feel he needs any additional imaging.  I actually feel that psychiatric evaluation is going to be the best way to get to the root of his problems.  I have ordered screening labs of CBC, comprehensive metabolic panel, lipase and I have ordered a dose of ketorolac and ondansetron.  Patient refused ondansetron and laboratory workup, did except an injection of ketorolac which did give him good relief of pain.  He is asking for something to help his breathing.  However, his breathing is normal  with oxygen saturation of 100% and I have advised him that I did not have anything to offer him.  I did suggest that he might benefit from evaluation of his anxiety, but patient seemed very resistant to this.  I have encouraged him to follow-up with the various members of his physician team.  Final Clinical Impression(s) / ED Diagnoses Final diagnoses:  RUQ abdominal pain    Rx / DC Orders ED Discharge Orders     None         Delora Fuel, MD 12/75/17 [phone]

## 2022-11-17 NOTE — Discharge Instructions (Signed)
You may take as seen benefit and/your ibuprofen as needed for pain.  Please follow-up with your physician team.  However, I do suspect there may be some component of anxiety in addition to the physical issues and I would recommend you consider seeing someone regarding that as well.

## 2022-11-17 NOTE — Telephone Encounter (Signed)
Called and spoke with pt letting him know info per CY and he verbalized understanding. Nothing further needed. 

## 2022-11-17 NOTE — Telephone Encounter (Signed)
Pt hyper verbal, difficult to redirect. Calling to request earlier referral to "Specialist." Multiple calls since seen in ED today. Advised ED for worsening symptoms. Pt states he is waiting for PCPs nurse to call back.      Answer Assessment - Initial Assessment Questions 1. REASON FOR CALL or QUESTION: "What is your reason for calling today?" or "How can I best help you?" or "What question do you have that I can help answer?"     See specialist sooner  Protocols used: Information Only Call - No Triage-A-AH

## 2022-11-17 NOTE — Telephone Encounter (Signed)
He has been seen and tested repeatedly.  Recommend he be seen as routinely scheduled On 1/30

## 2022-11-17 NOTE — Telephone Encounter (Signed)
Hey just wanted to send a quick FYI. This patient is scheduled for EGD on 11/22/22. He has had multiple visits to the hospital since he last office visit with Nicoletta Ba, PA. I was reading thru all of the telephone notes and I saw you were wanting pulmonary clearance prior to his EGD but then I also saw from Amy that it wouldn't be necessary to hold off the procedure for this. Please advise if your ok with proceeding as is. His pulmonary test is scheduled for 11/20/2022. Completed his PV and patient is still very concerned with his ongoing issues with include SOB, chest pain, and feeling of difficult swallowing. Patients was last seen in the ED this morning.   Thank you

## 2022-11-17 NOTE — Telephone Encounter (Signed)
Spoke with pt who c/o SOB, pt denied wheezing/ cough/ GI upset/ fever/ chills. Pt stated he went to ER r/t SOB but they did not do anything for him. He is scheduled for OV on 11/21/22 and requested a sooner appointment but there are none available in clinic. Pt advised if symptoms get worse to seek evaluation at [facility] or ED. When pt asked if he was given any inhalers in ED he stated he was but did not want to use it because he was not sure what it was and what exactly was going on with himself. Dr. Annamaria Boots please advise

## 2022-11-17 NOTE — Telephone Encounter (Signed)
OK to proceed with EGD. We are not going to provide FMLA for his globus sensation, EGD. He should reach out to his PCP.

## 2022-11-17 NOTE — ED Notes (Signed)
Entered room to start PIV - explained to patient the plan per Dr. Roxanne Mins including starting PIV, collecting labs and administering meds for nausea and pain.  Pt immediately states he does not need pain medication- after a few moments pt then states he doesn't need labs drawn -- states labs have been drawn multiple times over the course of last few days and have been normal.  Pt states he only wants pain medication right now.  Dr. Roxanne Mins made aware -- Toradol IVP 30mg  initially ordered; now changed to 60mg  IM

## 2022-11-17 NOTE — Progress Notes (Signed)
No egg or soy allergy known to patient  No issues known to pt with past sedation with any surgeries or procedures Patient denies ever being told they had issues or difficulty with intubation  No FH of Malignant Hyperthermia Pt is not on diet pills Pt is not on  home 02  Pt is not on blood thinners  Pt denies issues with constipation  No A fib or A flutter Have any cardiac testing pending--no Pt instructed to use [employer] or GoodRx for a price reduction on prep   

## 2022-11-17 NOTE — ED Notes (Signed)
Pt requesting to be discharged- Dr. Roxanne Mins notified via secure chat

## 2022-11-17 NOTE — ED Triage Notes (Signed)
  Patient BIB EMS for abdominal pain and flank pain that has been going on since he was diagnosed with covid 2 weeks ago.  Patient was seen at [facility] yesterday for SOB and workup was unremarkable. Pain 9/10, aching/sore.  Patient states pain is intermittent.

## 2022-11-17 NOTE — ED Notes (Signed)
Dr. Roxanne Mins has discussed d/c plan with patient -- pt agreeable with d/c plan - this nurse has reinforced d/c instructions verbally and provided pt with written copy.  Pt acknowledges verbal understanding and denies any addl questions, concerns,needs- ambulatory at d/c with steady gait; vitals stable; no distress.

## 2022-11-18 ENCOUNTER — Other Ambulatory Visit: Payer: 59

## 2022-11-19 ENCOUNTER — Emergency Department (HOSPITAL_BASED_OUTPATIENT_CLINIC_OR_DEPARTMENT_OTHER): Payer: 59 | Admitting: Radiology

## 2022-11-19 ENCOUNTER — Emergency Department (HOSPITAL_BASED_OUTPATIENT_CLINIC_OR_DEPARTMENT_OTHER)
Admission: EM | Admit: 2022-11-19 | Discharge: 2022-11-19 | Disposition: A | Discharge status: Home / Self Care | Payer: 59 | Attending: Emergency Medicine | Admitting: Emergency Medicine

## 2022-11-19 ENCOUNTER — Encounter: Payer: Self-pay | Admitting: Gastroenterology

## 2022-11-19 ENCOUNTER — Telehealth: Payer: Self-pay | Admitting: Gastroenterology

## 2022-11-19 ENCOUNTER — Other Ambulatory Visit: Payer: Self-pay

## 2022-11-19 ENCOUNTER — Encounter (HOSPITAL_BASED_OUTPATIENT_CLINIC_OR_DEPARTMENT_OTHER): Payer: Self-pay

## 2022-11-19 DIAGNOSIS — R110 Nausea: Secondary | ICD-10-CM | POA: Insufficient documentation

## 2022-11-19 DIAGNOSIS — R079 Chest pain, unspecified: Secondary | ICD-10-CM | POA: Insufficient documentation

## 2022-11-19 DIAGNOSIS — Z91010 Allergy to peanuts: Secondary | ICD-10-CM | POA: Insufficient documentation

## 2022-11-19 DIAGNOSIS — Z743 Need for continuous supervision: Secondary | ICD-10-CM | POA: Diagnosis not present

## 2022-11-19 DIAGNOSIS — R0789 Other chest pain: Secondary | ICD-10-CM | POA: Diagnosis not present

## 2022-11-19 LAB — BASIC METABOLIC PANEL
Anion gap: 9 (ref 5–15)
BUN: 6 mg/dL (ref 6–20)
CO2: 29 mmol/L (ref 22–32)
Calcium: 10.1 mg/dL (ref 8.9–10.3)
Chloride: 102 mmol/L (ref 98–111)
Creatinine, Ser: 0.9 mg/dL (ref 0.61–1.24)
GFR, Estimated: >60 mL/min (ref >60)
Glucose, Bld: 80 mg/dL (ref 70–99)
Potassium: 3.6 mmol/L (ref 3.5–5.1)
Sodium: 140 mmol/L (ref 135–145)

## 2022-11-19 LAB — CBC
HCT: 40.2 % (ref 39.0–52.0)
Hemoglobin: 14.4 g/dL (ref 13.0–17.0)
MCH: 28.5 pg (ref 26.0–34.0)
MCHC: 35.8 g/dL (ref 30.0–36.0)
MCV: 79.6 fL — ABNORMAL LOW (ref 80.0–100.0)
Platelets: 185 10*3/uL (ref 150–400)
RBC: 5.05 MIL/uL (ref 4.22–5.81)
RDW: 12.5 % (ref 11.5–15.5)
WBC: 4.5 10*3/uL (ref 4.0–10.5)
nRBC: 0 % (ref 0.0–0.2)

## 2022-11-19 LAB — TROPONIN I (HIGH SENSITIVITY)
Troponin I (High Sensitivity): 5 ng/L (ref <18)
Troponin I (High Sensitivity): 6 ng/L (ref <18)

## 2022-11-19 MED ORDER — ALUM & MAG HYDROXIDE-SIMETH 200-200-20 MG/5ML PO SUSP
30.0000 mL | Freq: Once | ORAL | Status: AC
  Administered 2022-11-19: 30 mL via ORAL
  Filled 2022-11-19: qty 30

## 2022-11-19 MED ORDER — LIDOCAINE VISCOUS HCL 2 % MT SOLN
15.0000 mL | Freq: Once | OROMUCOSAL | Status: AC
  Administered 2022-11-19: 15 mL via OROMUCOSAL
  Filled 2022-11-19: qty 15

## 2022-11-19 MED ORDER — SUCRALFATE 1 G PO TABS: 1.0000 g | ORAL_TABLET | Freq: Three times a day (TID) | ORAL | 0 refills | Status: DC

## 2022-11-19 NOTE — Discharge Instructions (Signed)
Please take your medications as prescribed. Please take tylenol/ibuprofen for pain. I recommend close follow-up with PCP for reevaluation.  Please do not hesitate to return to emergency department if worrisome signs symptoms we discussed become apparent. 

## 2022-11-19 NOTE — Telephone Encounter (Signed)
Called again Having bad reflux But also having shortness of breath  Went over his barium swallow, chest x-ray, CT neck recently performed when he was in the emergency room  While I was on the phone, the EMS arrived.  I advised him to increase omeprazole to 20 mg p.o. BID He should go to ED and be evaluated for shortness of breath which is not explained by reflux  Will forward note to Amy  RG

## 2022-11-19 NOTE — ED Provider Notes (Signed)
[name] Note   CSN: [id number] Arrival date & time: 11/19/22  3016     History  Chief Complaint  Patient presents with   Chest Pain   Gastroesophageal Reflux    Nicholas Johnson. is a 32 y.o. male with a past medical history of psychosis presenting today for evaluation of chest pain.  Patient reports that he has had intermittent chest pain in the last 2 weeks.  He described it as "indigestion" feeling, worse with movement and after eating.  Patient reports he has tried Protonix, Pepcid with no relief.  Patient has multiple visits in the ER in the last 1 month.  Endorses nausea without vomiting.  Denies fever, bowel changes or urinary symptoms.   Chest Pain Gastroesophageal Reflux Associated symptoms include chest pain.    Past Medical History:  Diagnosis Date   Nasal congestion    Parotid gland pain    Psychosis ([place])    admitted 12/15   Past Surgical History:  Procedure Laterality Date   HIP ARTHROSCOPY     WISDOM TOOTH EXTRACTION     25       Home Medications Prior to Admission medications   Medication Sig Start Date End Date Taking? Authorizing Provider  sucralfate (CARAFATE) 1 g tablet Take 1 tablet (1 g total) by mouth 4 (four) times daily -  with meals and at bedtime for 14 days. 11/19/22 12/03/22 Yes Rex Kras, PA  albuterol (VENTOLIN HFA) 108 (90 Base) MCG/ACT inhaler Inhale 2 puffs into the lungs every 6 (six) hours as needed for wheezing or shortness of breath. Patient not taking: Reported on 11/17/2022 11/16/22   Merlyn Lot, MD  cetirizine (ZYRTEC ALLERGY) 10 MG tablet Take 1 tablet (10 mg total) by mouth daily. 11/13/22 11/13/23  Cuthriell, Charline Bills, PA-C  fluticasone (FLONASE) 50 MCG/ACT nasal spray Place 1 spray into both nostrils daily. 11/16/22 11/16/23  Carrie Mew, MD  Fluticasone-Umeclidin-Vilant (TRELEGY ELLIPTA) 100-62.5-25 MCG/ACT AEPB Inhale 1 puff into the lungs daily. Patient not  taking: Reported on 11/17/2022 11/13/22   Deneise Lever, MD  hyoscyamine (LEVSIN SL) 0.125 MG SL tablet 1 tablet by mouth 30 minutes before meals 3 times a day Patient not taking: Reported on 11/17/2022 11/09/22   Esterwood, Amy S, PA-C  ibuprofen (ADVIL) 800 MG tablet Take 1 tablet (800 mg total) by mouth 3 (three) times daily. Patient not taking: Reported on 11/17/2022 03/05/22   Charlesetta Shanks, MD  lamoTRIgine (LAMICTAL) 100 MG tablet Take 1 tablet (100 mg total) by mouth daily. Patient not taking: Reported on 11/17/2022 11/09/22   Marcial Pacas, MD  lamoTRIgine (LAMICTAL) 25 MG tablet One tab qhs xone week 2 tabs qhs xone week 3 tabs qhs xone week Patient not taking: Reported on 11/17/2022 11/09/22   Marcial Pacas, MD  magic mouthwash w/lidocaine SOLN Take 5 mLs by mouth 4 (four) times daily. Patient not taking: Reported on 11/17/2022 11/13/22   Cuthriell, Charline Bills, PA-C  omeprazole (PRILOSEC) 20 MG capsule Take 1 capsule (20 mg total) by mouth 2 (two) times daily before a meal for 14 days. 11/02/22 0/10/93  [name] Stall P, DO  sucralfate (CARAFATE) 1 g tablet dissolve in 10cc of water and take as a slurry between meals and bedtime Patient not taking: Reported on 11/17/2022 11/14/22   Doran Stabler, MD  SUMAtriptan (IMITREX) 50 MG tablet May repeat in 2 hours if headache persists or recurs. Patient not taking: Reported on 11/17/2022  11/09/22   Levert Feinstein, MD      Allergies    Peanut-containing drug products and Wheat bran    Review of Systems   Review of Systems  Cardiovascular:  Positive for chest pain.    Physical Exam Updated Vital Signs BP (!) 142/91   Pulse (!) 117   Temp 98.4 F (36.9 C) (Oral)   Resp 17   Ht 5\' 8"  (1.727 m)   Wt 53.2 kg   SpO2 100%   BMI 17.83 kg/m  Physical Exam Vitals and nursing note reviewed.  Constitutional:      Appearance: Normal appearance.  HENT:     Head: Normocephalic and atraumatic.     Mouth/Throat:     Mouth: Mucous membranes are moist.   Eyes:     General: No scleral icterus. Cardiovascular:     Rate and Rhythm: Normal rate and regular rhythm.     Pulses: Normal pulses.     Heart sounds: Normal heart sounds.  Pulmonary:     Effort: Pulmonary effort is normal.     Breath sounds: Normal breath sounds.  Abdominal:     General: Abdomen is flat.     Palpations: Abdomen is soft.     Tenderness: There is no abdominal tenderness.  Musculoskeletal:        General: No deformity.  Skin:    General: Skin is warm.     Findings: No rash.  Neurological:     General: No focal deficit present.     Mental Status: He is alert.  Psychiatric:        Mood and Affect: Mood normal.     ED Results / Procedures / Treatments   Labs (all labs ordered are listed, but only abnormal results are displayed) Labs Reviewed  CBC - Abnormal; Notable for the following components:      Result Value   MCV 79.6 (*)    All other components within normal limits  BASIC METABOLIC PANEL  TROPONIN I (HIGH SENSITIVITY)  TROPONIN I (HIGH SENSITIVITY)    EKG None  Radiology DG Chest 2 View  Result Date: 11/19/2022 CLINICAL DATA:  Chest pain. EXAM: CHEST - 2 VIEW COMPARISON:  11/14/2022 FINDINGS: The cardiomediastinal contours are normal. Similar hyperinflation. Pulmonary vasculature is normal. No consolidation, pleural effusion, or pneumothorax. No acute osseous abnormalities are seen. IMPRESSION: Unchanged hyperinflation.  No acute findings. Electronically Signed   By: 11/16/2022 M.D.   On: 11/19/2022 19:38    Procedures Procedures    Medications Ordered in ED Medications  alum & mag hydroxide-simeth (MAALOX/MYLANTA) 200-200-20 MG/5ML suspension 30 mL (30 mLs Oral Given 11/19/22 1934)  lidocaine (XYLOCAINE) 2 % viscous mouth solution 15 mL (15 mLs Mouth/Throat Given 11/19/22 1934)    ED Course/ Medical Decision Making/ A&P                             Medical Decision Making Amount and/or Complexity of Data Reviewed Labs:  ordered. Radiology: ordered.  Risk OTC drugs. Prescription drug management.   This patient presents to the ED for chest pain, this involves an extensive number of treatment options, and is a complaint that carries with a high risk of complications and morbidity.  The differential diagnosis includes ACS/NSTEMI, aortic dissection, esophageal rupture, PE, pneumothorax, tamponade and, cocaine chest pain, endocarditis, GERD, musculoskeletal, pericarditis, pneumonia.  This is not an exhaustive list.  Lab tests: I ordered and personally interpreted labs.  The  pertinent results include: WBC unremarkable. Hbg unremarkable. Platelets unremarkable. No electrolyte abnormalities noted. BUN, creatinine unremarkable. UA significant for no acute abnormality.   Imaging studies: I ordered imaging studies. I personally reviewed, interpreted imaging and agree with the radiologist's interpretations. The results include: Chest x-ray with no acute abnormalities.  Problem list/ ED course/ Critical interventions/ Medical management: HPI: See above Vital signs within normal range and stable throughout visit. Laboratory/imaging studies significant for: See above. On physical examination, patient is afebrile and appears in no acute distress. Exam without evidence of volume overload so doubt heart failure. EKG without signs of active ischemia. Given the timing of pain to ER presentation, delta troponin negative so doubt NSTEMI. Presentation not consistent with acute PE ([facility] low risk),pneumothorax (not visualized on chest xr), thoracic aortic dissection, pericarditis, tamponade, pneumonia (no infectious symptoms, clear chest xr), myocarditis (no recent illness, neg trop). Patient's symptoms have improved during evaluation in the ED. HEART score:0 so plan to discharge patient home with cardiology follow up. I have reviewed the patient home medicines and have made adjustments as needed.  Cardiac monitoring/EKG: The  patient was maintained on a cardiac monitor.  I personally reviewed and interpreted the cardiac monitor which showed an underlying rhythm of: sinus rhythm.  Additional history obtained: External records from outside source obtained and reviewed including: Chart review including previous notes, labs, imaging.  Disposition Continued outpatient therapy. Follow-up with cardiology recommended for reevaluation of symptoms. Treatment plan discussed with patient.  Pt acknowledged understanding was agreeable to the plan. Worrisome signs and symptoms were discussed with patient, and patient acknowledged understanding to return to the ED if they noticed these signs and symptoms. Patient was stable upon discharge.   This chart was dictated using voice recognition software.  Despite best efforts to proofread,  errors can occur which can change the documentation meaning.          Final Clinical Impression(s) / ED Diagnoses Final diagnoses:  Chest pain, unspecified type    Rx / DC Orders ED Discharge Orders          Ordered    Ambulatory referral to Cardiology       Comments: If you have not heard from the Cardiology office within the next 72 hours please call [phone].   11/19/22 2332    sucralfate (CARAFATE) 1 g tablet  3 times daily with meals & bedtime        11/19/22 2334              Rex Kras, PA 11/19/22 2357    Dorie Rank, MD 11/21/22 1032

## 2022-11-19 NOTE — ED Triage Notes (Signed)
Patient arrives via EMS from home with complaints of worsening GERD symptoms and chest pain. Patient described the pain as sharp/dull worse after eating.  No relief OTC medications.   Patient had a negative 12 Lead EKG with ems.  He is scheduled to see a pulmonologist and GI specialist this week.   No pain in triage. EMS vitals:  142/76 BP  82 HR 98% on room air

## 2022-11-19 NOTE — Telephone Encounter (Signed)
Oncall Note  Patient paged answering service multiple times last night with c/o acid reflux symptoms between 1 and 2 AM.   Pressured speech, sounded very anxious, c/o heartburn , ?chest pain and SOB. Advised him to come to ER if he feels his symptoms are worse needing urgent attention  Discussed with answering service why they were forwarding repeated messages for acid reflux at midnight , response was if patient thinks its an emergency they have to call the doc  Office may have to educate patients regarding rules to call office during off hours/Oncall doctor  K. Denzil Magnuson , MD [phone]

## 2022-11-20 ENCOUNTER — Telehealth: Payer: Self-pay

## 2022-11-20 ENCOUNTER — Telehealth: Payer: Self-pay | Admitting: Pulmonary Disease

## 2022-11-20 ENCOUNTER — Encounter (INDEPENDENT_AMBULATORY_CARE_PROVIDER_SITE_OTHER): Payer: 59 | Admitting: Nurse Practitioner

## 2022-11-20 ENCOUNTER — Encounter: Payer: Self-pay | Admitting: Nurse Practitioner

## 2022-11-20 ENCOUNTER — Encounter: Payer: 59 | Admitting: Nurse Practitioner

## 2022-11-20 ENCOUNTER — Telehealth: Payer: Self-pay | Admitting: Physician Assistant

## 2022-11-20 ENCOUNTER — Other Ambulatory Visit (HOSPITAL_BASED_OUTPATIENT_CLINIC_OR_DEPARTMENT_OTHER): Payer: Self-pay

## 2022-11-20 ENCOUNTER — Telehealth: Payer: Self-pay | Admitting: Gastroenterology

## 2022-11-20 VITALS — BP 112/70 | HR 81 | Ht 68.0 in | Wt 122.6 lb

## 2022-11-20 DIAGNOSIS — R0602 Shortness of breath: Secondary | ICD-10-CM

## 2022-11-20 DIAGNOSIS — R0789 Other chest pain: Secondary | ICD-10-CM | POA: Diagnosis not present

## 2022-11-20 DIAGNOSIS — R1310 Dysphagia, unspecified: Secondary | ICD-10-CM

## 2022-11-20 HISTORY — DX: Other chest pain: R07.89

## 2022-11-20 LAB — PULMONARY FUNCTION TEST
FEF 25-75 Pre: 3.97 L/sec
FEF2575-%Pred-Pre: 92 %
FEV1-%Pred-Pre: 94 %
FEV1-Pre: 3.98 L
FEV1FVC-%Pred-Pre: 100 %
FEV6-%Pred-Pre: 95 %
FEV6-Pre: 4.84 L
FEV6FVC-%Pred-Pre: 101 %
FVC-%Pred-Pre: 94 %
FVC-Pre: 4.84 L
Pre FEV1/FVC ratio: 82 %
Pre FEV6/FVC Ratio: 100 %

## 2022-11-20 NOTE — Telephone Encounter (Signed)
Received multiple pages this evening about indigestion and reflux. Advised that he take the Prilosec that was prescribed, and to also take Maalox that he has at the house, along with Pepcid Complete prior to bedtime.   He called again to discuss his indigestion, wanting to know "could all the fluids just overflow". I reviewed his recent extensive evaluation, to include CT, esophaggram, ER evaluations, and multiple phone calls regarding the same issue. No emergent issues currently, to include food impaction, bleeding, fever, abdominal pain, etc. I tried to explain the difference between the emergency on-call line and routine daytime calls. He is already scheduled for EGD in 2 days for evaluation of the same.   Reassurance provided during each of the phone calls. Will send to his primary GI team to review in the AM.
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
## 2022-11-20 NOTE — Telephone Encounter (Signed)
New pain in the ribs, couldn't completely finish his PFT earlier, has extreme anxiety about taking his Trelegy. Took it on the phone with me and worried that it's not doing anything. I counseled that the effect is usually not immediate. He has no short acting meds prescribed.   The pain is non cardiac in nature, limited to bilateral ribs, nonexertional in nature, and not alleviated by rest. It has persisted throughout the day without significant cough.  Patient is anxious about the side effects of the medication and not sure if he took it appropriately or whether there is any left since he may have inadvertently loaded the device too many times.   Referred the patient to the clinic for teaching and re-visiting the use of SABA.   ER precautions reviewed. If his pain gets worse or if the Dyspnea worsens, he will seek emergency care.

## 2022-11-20 NOTE — Assessment & Plan Note (Signed)
Plan for EGD next week with GI. Encouraged him to keep this appointment for further evaluation. He has discontinued all medications aside from omeprazole, which we discussed could be contributing to his lack of improvement. He verbalized understanding but declined to retry at this point.

## 2022-11-20 NOTE — Assessment & Plan Note (Signed)
Symptoms seem to be upper airway in nature but unable to rule out asthmatic component. He did complete spirometry, with normal lung function. We will get him set up to repeat PFTs so we can evaluate DLCO and bronchodilator response. He has yet to trial inhaler therapy as he was hesitant to start new medications. Medication education provided today. He was agreeable to starting this. Plan to re-evaluate in 4 weeks.   Patient Instructions  Trial Trelegy 1 puff daily. Brush tongue and rinse mouth afterwards Restart fluticasone (Flonase) 1 spray each nostril daily  Attend endoscopy with gastroenterology   Attend thyroid ultrasound  Go to PFT at 1 pm  Follow up with Dr. Annamaria Boots in 4 weeks to see how inhaler is working. Someone will contact you regarding your PFTs. If symptoms worsen, please contact office for sooner follow up or seek emergency care.

## 2022-11-20 NOTE — Telephone Encounter (Signed)
Patient states he took "3 ibuprofen on Saturday."  Advised the patient to only take Tylenol per package directions if he needed something for pain/discomfort. Avoid all NSAIDs.

## 2022-11-20 NOTE — Telephone Encounter (Signed)
Inbound call from patient requesting a call back from the nurse to discuss his new symptoms he is experiencing. Please advise.

## 2022-11-20 NOTE — Patient Instructions (Signed)
Trial Trelegy 1 puff daily. Brush tongue and rinse mouth afterwards Restart fluticasone (Flonase) 1 spray each nostril daily  Attend endoscopy with gastroenterology   Attend thyroid ultrasound  Go to PFT at 1 pm  Follow up with Dr. Annamaria Boots in 4 weeks to see how inhaler is working. Someone will contact you regarding your PFTs. If symptoms worsen, please contact office for sooner follow up or seek emergency care.

## 2022-11-20 NOTE — Assessment & Plan Note (Signed)
Resolved today, per his report. Previously suspected to be related to GERD. ACS and pulm workup in ED was unremarkable. He never tried the carafate prescribed. Encouraged him to discuss this with GI at his upcoming appt.

## 2022-11-20 NOTE — Telephone Encounter (Signed)
Inbound call, patient states he would like to speak to a nurse about his appointment with Pulmonology, in addition, to his new symptoms. Please advise.

## 2022-11-21 ENCOUNTER — Ambulatory Visit: Payer: 59 | Admitting: Nurse Practitioner

## 2022-11-21 ENCOUNTER — Ambulatory Visit
Admission: RE | Admit: 2022-11-21 | Discharge: 2022-11-21 | Disposition: A | Discharge status: Home / Self Care | Payer: 59 | Source: Ambulatory Visit | Attending: Physician Assistant | Admitting: Physician Assistant

## 2022-11-21 ENCOUNTER — Telehealth: Payer: Self-pay | Admitting: Physician Assistant

## 2022-11-21 DIAGNOSIS — H43393 Other vitreous opacities, bilateral: Secondary | ICD-10-CM | POA: Diagnosis not present

## 2022-11-21 DIAGNOSIS — E079 Disorder of thyroid, unspecified: Secondary | ICD-10-CM | POA: Diagnosis not present

## 2022-11-21 DIAGNOSIS — M542 Cervicalgia: Secondary | ICD-10-CM | POA: Diagnosis not present

## 2022-11-21 DIAGNOSIS — J392 Other diseases of pharynx: Secondary | ICD-10-CM

## 2022-11-21 DIAGNOSIS — R1313 Dysphagia, pharyngeal phase: Secondary | ICD-10-CM

## 2022-11-21 NOTE — Telephone Encounter (Signed)
Spoke with the patient. Please see telephone encounter of 11/22/22.

## 2022-11-21 NOTE — Telephone Encounter (Signed)
Medication education was reviewed extensively at his appointment. Teachback was also completed. Agree with ER precautions. His pain has been worked up numerous times but should seek care if they worsen. He was encouraged by GI to restart medications for his reflux, as he also stopped these. Thanks for the update.

## 2022-11-21 NOTE — Telephone Encounter (Signed)
Patient is calling to speak about EGD on tomorrow. Please advise

## 2022-11-21 NOTE — Telephone Encounter (Signed)
Called the patient back. He tells me he could not do his pulmonology test yesterday. He feels congested. He was not sure if he was performing the test correctly and states,"I could not do it." Patient speaking fast. Had to get him to repeat his statement. He has a care partner for the EGD tomorrow. Reminded him he can only have clear liquids tomorrow until 3 hours prior, then he must absolutely fast. Encouraged patient to review his written instructions.  Patient states he is taking his Prilosec BID and he is not really eating food, only consuming liquids. He also tells me the papers he wants filled out for his "ADA" need more specific information about how much he would need to be out of work. Advised the patient that I do not see the papers, however, if we are completing this paperwork for him, we may not be able to provide more details if we do not have a diagnosis. Also reminded the patient to go to his u/s of the thyroid area today.

## 2022-11-21 NOTE — Progress Notes (Signed)
Again unable to complete postbronchodilator spirometry and DLCO. His PFT that he did complete was normal. No evidence of fixed or variable obstruction on flow loop. I still recommend he try the Trelegy consistently for the next 4-6 weeks to see if he has any perceived benefit. He has to use medications consistently to determine if they are effective or not. Thanks.

## 2022-11-22 ENCOUNTER — Emergency Department (HOSPITAL_COMMUNITY): Payer: 59

## 2022-11-22 ENCOUNTER — Encounter: Payer: Self-pay | Admitting: Gastroenterology

## 2022-11-22 ENCOUNTER — Telehealth: Payer: Self-pay

## 2022-11-22 ENCOUNTER — Telehealth: Payer: Self-pay | Admitting: Nurse Practitioner

## 2022-11-22 ENCOUNTER — Telehealth: Payer: Self-pay | Admitting: Gastroenterology

## 2022-11-22 ENCOUNTER — Other Ambulatory Visit: Payer: Self-pay

## 2022-11-22 ENCOUNTER — Ambulatory Visit (AMBULATORY_SURGERY_CENTER): Payer: 59 | Admitting: Gastroenterology

## 2022-11-22 ENCOUNTER — Emergency Department (HOSPITAL_COMMUNITY)
Admission: EM | Admit: 2022-11-22 | Discharge: 2022-11-22 | Disposition: A | Discharge status: Home / Self Care | Payer: 59 | Attending: Emergency Medicine | Admitting: Emergency Medicine

## 2022-11-22 ENCOUNTER — Telehealth: Payer: Self-pay | Admitting: Internal Medicine

## 2022-11-22 VITALS — BP 114/67 | HR 79 | Temp 98.0°F | Resp 11 | Ht 68.0 in | Wt 117.0 lb

## 2022-11-22 DIAGNOSIS — R079 Chest pain, unspecified: Secondary | ICD-10-CM | POA: Diagnosis not present

## 2022-11-22 DIAGNOSIS — J029 Acute pharyngitis, unspecified: Secondary | ICD-10-CM

## 2022-11-22 DIAGNOSIS — Z91010 Allergy to peanuts: Secondary | ICD-10-CM | POA: Diagnosis not present

## 2022-11-22 DIAGNOSIS — R0789 Other chest pain: Secondary | ICD-10-CM | POA: Diagnosis not present

## 2022-11-22 DIAGNOSIS — K209 Esophagitis, unspecified without bleeding: Secondary | ICD-10-CM | POA: Diagnosis not present

## 2022-11-22 DIAGNOSIS — M542 Cervicalgia: Secondary | ICD-10-CM | POA: Diagnosis not present

## 2022-11-22 DIAGNOSIS — R09A2 Foreign body sensation, throat: Secondary | ICD-10-CM

## 2022-11-22 LAB — BASIC METABOLIC PANEL
Anion gap: 7 (ref 5–15)
BUN: 5 mg/dL — ABNORMAL LOW (ref 6–20)
CO2: 27 mmol/L (ref 22–32)
Calcium: 9.7 mg/dL (ref 8.9–10.3)
Chloride: 103 mmol/L (ref 98–111)
Creatinine, Ser: 0.82 mg/dL (ref 0.61–1.24)
GFR, Estimated: >60 mL/min (ref >60)
Glucose, Bld: 92 mg/dL (ref 70–99)
Potassium: 4.1 mmol/L (ref 3.5–5.1)
Sodium: 137 mmol/L (ref 135–145)

## 2022-11-22 LAB — CBC
HCT: 41.7 % (ref 39.0–52.0)
Hemoglobin: 14.4 g/dL (ref 13.0–17.0)
MCH: 28 pg (ref 26.0–34.0)
MCHC: 34.5 g/dL (ref 30.0–36.0)
MCV: 81.1 fL (ref 80.0–100.0)
Platelets: 216 10*3/uL (ref 150–400)
RBC: 5.14 MIL/uL (ref 4.22–5.81)
RDW: 12.5 % (ref 11.5–15.5)
WBC: 4.1 10*3/uL (ref 4.0–10.5)
nRBC: 0 % (ref 0.0–0.2)

## 2022-11-22 LAB — I-STAT CHEM 8, ED
BUN: <3 mg/dL — ABNORMAL LOW (ref 6–20)
Calcium, Ion: 1.26 mmol/L (ref 1.15–1.40)
Chloride: 100 mmol/L (ref 98–111)
Creatinine, Ser: 0.8 mg/dL (ref 0.61–1.24)
Glucose, Bld: 89 mg/dL (ref 70–99)
HCT: 43 % (ref 39.0–52.0)
Hemoglobin: 14.6 g/dL (ref 13.0–17.0)
Potassium: 4.2 mmol/L (ref 3.5–5.1)
Sodium: 140 mmol/L (ref 135–145)
TCO2: 26 mmol/L (ref 22–32)

## 2022-11-22 LAB — TROPONIN I (HIGH SENSITIVITY)
Troponin I (High Sensitivity): 6 ng/L (ref <18)
Troponin I (High Sensitivity): 5 ng/L (ref <18)

## 2022-11-22 MED ORDER — ONDANSETRON HCL 4 MG/2ML IJ SOLN
4.0000 mg | Freq: Once | INTRAMUSCULAR | Status: DC
  Filled 2022-11-22: qty 2

## 2022-11-22 MED ORDER — ACETAMINOPHEN 500 MG PO TABS
1000.0000 mg | ORAL_TABLET | Freq: Once | ORAL | Status: DC
  Filled 2022-11-22: qty 2

## 2022-11-22 MED ORDER — HYDROMORPHONE HCL 1 MG/ML IJ SOLN
0.5000 mg | Freq: Once | INTRAMUSCULAR | Status: AC
  Administered 2022-11-22: 0.5 mg via INTRAVENOUS
  Filled 2022-11-22: qty 1

## 2022-11-22 MED ORDER — LACTATED RINGERS IV BOLUS
500.0000 mL | Freq: Once | INTRAVENOUS | Status: AC
  Administered 2022-11-22: 500 mL via INTRAVENOUS

## 2022-11-22 MED ORDER — ALUM & MAG HYDROXIDE-SIMETH 200-200-20 MG/5ML PO SUSP
30.0000 mL | Freq: Once | ORAL | Status: AC
  Administered 2022-11-22: 30 mL via ORAL
  Filled 2022-11-22: qty 30

## 2022-11-22 MED ORDER — IOHEXOL 300 MG/ML  SOLN
75.0000 mL | Freq: Once | INTRAMUSCULAR | Status: AC | PRN
  Administered 2022-11-22: 75 mL via INTRAVENOUS

## 2022-11-22 MED ORDER — LIDOCAINE VISCOUS HCL 2 % MT SOLN
15.0000 mL | Freq: Once | OROMUCOSAL | Status: DC
  Filled 2022-11-22: qty 15

## 2022-11-22 MED ORDER — SODIUM CHLORIDE 0.9 % IV SOLN: 500.0000 mL | Freq: Once | INTRAVENOUS | Status: DC

## 2022-11-22 MED ORDER — ACETAMINOPHEN 160 MG/5ML PO SOLN
1000.0000 mg | Freq: Once | ORAL | Status: AC
  Administered 2022-11-22: 1000 mg via ORAL
  Filled 2022-11-22: qty 40.6

## 2022-11-22 NOTE — Telephone Encounter (Signed)
Please obtain advice from the ED provider. Omeprazole as prescribed and Mylanta prn is fine. His chest and abdomen pain do not appear to be procedure related as it was an uneventful diagnostic EGD. As discussed recommend that he contacts his PCP for follow up care.

## 2022-11-22 NOTE — Discharge Instructions (Addendum)
Thank you for coming to [facility] Emergency Department. You were seen for chest pain after endoscopy. We did an exam, labs, and imaging, and these showed  no acute findings.  Please follow up with your primary care provider and/or gastroenterologist within 1 week.   Do not hesitate to return to the ED or call 911 if you experience: -Worsening symptoms -Lightheadedness, passing out -Fevers/chills -Anything else that concerns you

## 2022-11-22 NOTE — ED Provider Notes (Signed)
[facility] Provider Note   CSN: [id number] Arrival date & time: 11/22/22  1228     History {Add pertinent medical, surgical, social history, OB history to HPI:1} Chief Complaint  Patient presents with   Chest Pain    Nicholas Johnson. is a 32 y.o. male with dysphagia, GERD, migraines, history of psychosis who presents with chest pain.   Patient states he was having endoscopy done today for reflux symptoms and developed acute onset CP thereafter when he got up to start walking to his car. Patient denies any SOB, N/V. Feels like pain is "deep inside" and rated 10/10, never had any pain like this before. Did have a biopsy but unsure exactly what they did, unsure if they did dilation or not. Pain is worse with swallowing/inspiration. Radiates to epigastric region.   Chest Pain      Home Medications Prior to Admission medications   Medication Sig Start Date End Date Taking? Authorizing Provider  albuterol (VENTOLIN HFA) 108 (90 Base) MCG/ACT inhaler Inhale 2 puffs into the lungs every 6 (six) hours as needed for wheezing or shortness of breath. Patient not taking: Reported on 11/17/2022 11/16/22   Merlyn Lot, MD  cetirizine (ZYRTEC ALLERGY) 10 MG tablet Take 1 tablet (10 mg total) by mouth daily. Patient not taking: Reported on 11/20/2022 11/13/22 11/13/23  Cuthriell, Charline Bills, PA-C  fluticasone [facility]) 50 MCG/ACT nasal spray Place 1 spray into both nostrils daily. Patient not taking: Reported on 11/20/2022 11/16/22 11/16/23  Carrie Mew, MD  Fluticasone-Umeclidin-Vilant (TRELEGY ELLIPTA) 100-62.5-25 MCG/ACT AEPB Inhale 1 puff into the lungs daily. Patient not taking: Reported on 11/17/2022 11/13/22   Deneise Lever, MD  hyoscyamine (LEVSIN SL) 0.125 MG SL tablet 1 tablet by mouth 30 minutes before meals 3 times a day Patient not taking: Reported on 11/17/2022 11/09/22   Esterwood, Amy S, PA-C  ibuprofen (ADVIL) 800 MG tablet  Take 1 tablet (800 mg total) by mouth 3 (three) times daily. Patient not taking: Reported on 11/17/2022 03/05/22   Charlesetta Shanks, MD  lamoTRIgine (LAMICTAL) 100 MG tablet Take 1 tablet (100 mg total) by mouth daily. Patient not taking: Reported on 11/17/2022 11/09/22   Marcial Pacas, MD  lamoTRIgine (LAMICTAL) 25 MG tablet One tab qhs xone week 2 tabs qhs xone week 3 tabs qhs xone week Patient not taking: Reported on 11/17/2022 11/09/22   Marcial Pacas, MD  magic mouthwash w/lidocaine SOLN Take 5 mLs by mouth 4 (four) times daily. Patient not taking: Reported on 11/17/2022 11/13/22   Cuthriell, Charline Bills, PA-C  omeprazole (PRILOSEC) 20 MG capsule Take 1 capsule (20 mg total) by mouth 2 (two) times daily before a meal for 14 days. 11/02/22 8/54/62  [name] Stall P, DO  sucralfate (CARAFATE) 1 g tablet dissolve in 10cc of water and take as a slurry between meals and bedtime Patient not taking: Reported on 11/17/2022 11/14/22   Doran Stabler, MD  sucralfate (CARAFATE) 1 g tablet Take 1 tablet (1 g total) by mouth 4 (four) times daily -  with meals and at bedtime for 14 days. Patient not taking: Reported on 11/20/2022 11/19/22 12/03/22  Rex Kras, PA  SUMAtriptan (IMITREX) 50 MG tablet May repeat in 2 hours if headache persists or recurs. Patient not taking: Reported on 11/17/2022 11/09/22   Marcial Pacas, MD      Allergies    Peanut-containing drug products and Wheat bran    Review of Systems  Review of Systems  Cardiovascular:  Positive for chest pain.   Review of systems Negative for f/c.  A 10 point review of systems was performed and is negative unless otherwise reported in HPI.  Physical Exam Updated Vital Signs BP 115/72 (BP Location: Left Arm)   Pulse 72   Temp 98.1 F (36.7 C) (Oral)   Resp 15   SpO2 100%  Physical Exam General: Uncomfortable appearing male, sitting in bed.  HEENT: Sclera anicteric, MMM, trachea midline.  Chest: No crepitus to palpation Cardiology: RRR, no  murmurs/rubs/gallops. BL radial and DP pulses equal bilaterally.  Resp: Normal respiratory rate and effort. CTAB, no wheezes, rhonchi, crackles.  Abd: Soft, non-tender, non-distended. No rebound tenderness or guarding.  GU: Deferred. MSK: No peripheral edema or signs of trauma. Extremities without deformity or TTP. No cyanosis or clubbing. Skin: warm, dry. No rashes or lesions. Neuro: A&Ox4, CNs II-XII grossly intact. MAEs. Sensation grossly intact.  Psych: Normal mood and affect.   ED Results / Procedures / Treatments   Labs (all labs ordered are listed, but only abnormal results are displayed) Labs Reviewed  BASIC METABOLIC PANEL  CBC  TROPONIN I (HIGH SENSITIVITY)    EKG None  Radiology US THYROID  Result Date: 11/21/2022 CLINICAL DATA:  Anterior thyroid tenderness EXAM: THYROID ULTRASOUND TECHNIQUE: Ultrasound examination of the thyroid gland and adjacent soft tissues was performed. COMPARISON:  11/12/2022 FINDINGS: Parenchymal Echotexture: Normal Isthmus: 2 mm Right lobe: 4.4 x 1.4 x 1.3 cm Left lobe: 4.6 x 1.3 x 1.3 cm _________________________________________________________ Estimated total number of nodules >/= 1 cm: 0 Number of spongiform nodules >/=  2 cm not described below (TR1): 0 Number of mixed cystic and solid nodules >/= 1.5 cm not described below (TR2): 0 _________________________________________________________ No discrete nodules are seen within the thyroid gland. IMPRESSION: Normal thyroid ultrasound. The above is in keeping with the ACR TI-RADS recommendations - J Am Coll Radiol 2017;14:587-595. Electronically Signed   By: Jerilynn Mages.  Shick M.D.   On: 11/21/2022 16:01    Procedures Procedures  {Document cardiac monitor, telemetry assessment procedure when appropriate:1}  Medications Ordered in ED Medications - No data to display  ED Course/ Medical Decision Making/ A&P                          Medical Decision Making Amount and/or Complexity of Data  Reviewed Radiology: ordered.  Risk Prescription drug management.    This patient presents to the ED for concern of CP after endoscopy, this involves an extensive number of treatment options, and is a complaint that carries with it a high risk of complications and morbidity.  I considered the following differential and admission for this acute, potentially life threatening condition.   MDM:    Greatest concern for esophageal perforation as complication of endoscopy given acute onset chest pain, worse with swallowing/breathing/coughing, radiates to epigastric region. Called radiology to discuss best imaging modality and recommended oral contrast immediately prior to CT chest. Also consider mediastinitis, ACS/arrhythmia, PTX, pericarditis, cardiac tamponade. Had previously been in [facility], less likely to be PNA. No abdominal tenderness to palpation, lower c/f intraabdominal gastric perforation, no rebound tenderness/guarding.    Clinical Course as of 11/22/22 1309  Wed Nov 22, 2022  1300 D/w [facility] radiology who recommended administration of 50 cc of dilute omnipaque 300 at 10% concentration to drink immediately prior to the scan [HN]    Clinical Course User Index [HN] Audley Hose, MD  Labs: I Ordered, and personally interpreted labs.  The pertinent results include:  those listed above  Imaging Studies ordered: I ordered imaging studies including CT I independently visualized and interpreted imaging. I agree with the radiologist interpretation  Additional history obtained from chart review.  Cardiac Monitoring: The patient was maintained on a cardiac monitor.  I personally viewed and interpreted the cardiac monitored which showed an underlying rhythm of: NSR  Reevaluation: After the interventions noted above, I reevaluated the patient and found that they have :{resolved/improved/worsened:23923::"improved"}  Social Determinants of Health: Patient lives  independently   Disposition:  ***  Co morbidities that complicate the patient evaluation  Past Medical History:  Diagnosis Date   Nasal congestion    Parotid gland pain    Psychosis ([place])    admitted 12/15     Medicines No orders of the defined types were placed in this encounter.   I have reviewed the patients home medicines and have made adjustments as needed  Problem List / ED Course: Problem List Items Addressed This Visit   None        {Document critical care time when appropriate:1} {Document review of labs and clinical decision tools ie heart score, Chads2Vasc2 etc:1}  {Document your independent review of radiology images, and any outside records:1} {Document your discussion with family members, caretakers, and with consultants:1} {Document social determinants of health affecting pt's care:1} {Document your decision making why or why not admission, treatments were needed:1}  This note was created using dictation software, which may contain spelling or grammatical errors.

## 2022-11-22 NOTE — Progress Notes (Signed)
Patient got an inhaler from pulmonologist - [facility]

## 2022-11-22 NOTE — Telephone Encounter (Signed)
Patient is currently in ER. He can come pick up samples if we have any when he is released from ER. Nothing further needed

## 2022-11-22 NOTE — Telephone Encounter (Signed)
Patient had EGD today and says he's having severe chest pain and his throat hurts. Please advise

## 2022-11-22 NOTE — Telephone Encounter (Signed)
Pt had an endoscopy this morning with Dr. Fuller Plan, Currently at the ER at [facility] for chest pain, and upper abdominal pain rating a 10/10 on the pain scale. Pt states that "he was administered pain medication" at the hospital. Stated that he has received Chest X-rays at the hospital. Denies fever, chills, but states he has been having some shortness of breath while waiting for the x-rays results. Pt wanted to call  to see if can take any OTC antiacids when he was discharged from the ER.

## 2022-11-22 NOTE — Progress Notes (Signed)
Called to room to assist during endoscopic procedure.  Patient ID and intended procedure confirmed with present staff. Received instructions for my participation in the procedure from the performing physician.  

## 2022-11-22 NOTE — Telephone Encounter (Signed)
Patient had EGD and esophageal bxs today. Had chest pain and dyspnea over baseline and went to ED and had neg chest CT and CXR  He was advised to take Tylenol and antacids.  He called [place] again stating that he was still in pain and having dyspnea  I explained that I could not do anything more via phone call and that if he felt necessary he could return to ED. Also explained that there were no signs of complication or causality related to EGD

## 2022-11-22 NOTE — ED Triage Notes (Signed)
Pt states he was having endoscopy done today for reflux symptoms and developed CP thereafter. Pt denies any SOB, N/V.

## 2022-11-22 NOTE — Progress Notes (Signed)
Report to pacu rn. Vss. Care resumed by rn. 

## 2022-11-22 NOTE — Patient Instructions (Signed)
Please read handouts provided. Continue present medications. Await pathology results. Return to PCP, ENT for ongoing care.   YOU HAD AN ENDOSCOPIC PROCEDURE TODAY AT [facility]:   Refer to the procedure report that was given to you for any specific questions about what was found during the examination.  If the procedure report does not answer your questions, please call your gastroenterologist to clarify.  If you requested that your care partner not be given the details of your procedure findings, then the procedure report has been included in a sealed envelope for you to review at your convenience later.  YOU SHOULD EXPECT: Some feelings of bloating in the abdomen. Passage of more gas than usual.  Walking can help get rid of the air that was put into your GI tract during the procedure and reduce the bloating. If you had a lower endoscopy (such as a colonoscopy or flexible sigmoidoscopy) you may notice spotting of blood in your stool or on the toilet paper. If you underwent a bowel prep for your procedure, you may not have a normal bowel movement for a few days.  Please Note:  You might notice some irritation and congestion in your nose or some drainage.  This is from the oxygen used during your procedure.  There is no need for concern and it should clear up in a day or so.  SYMPTOMS TO REPORT IMMEDIATELY:   Following upper endoscopy (EGD)  Vomiting of blood or coffee ground material  New chest pain or pain under the shoulder blades  Painful or persistently difficult swallowing  New shortness of breath  Fever of 100F or higher  Black, tarry-looking stools  For urgent or emergent issues, a gastroenterologist can be reached at any hour by calling [phone]. Do not use MyChart messaging for urgent concerns.    DIET:  We do recommend a small meal at first, but then you may proceed to your regular diet.  Drink plenty of fluids but you should avoid alcoholic beverages for  24 hours.  ACTIVITY:  You should plan to take it easy for the rest of today and you should NOT DRIVE or use heavy machinery until tomorrow (because of the sedation medicines used during the test).    FOLLOW UP: Our staff will call the number listed on your records the next business day following your procedure.  We will call around 7:15- 8:00 am to check on you and address any questions or concerns that you may have regarding the information given to you following your procedure. If we do not reach you, we will leave a message.     If any biopsies were taken you will be contacted by phone or by letter within the next 1-3 weeks.  Please call us at [phone] if you have not heard about the biopsies in 3 weeks.    SIGNATURES/CONFIDENTIALITY: You and/or your care partner have signed paperwork which will be entered into your electronic medical record.  These signatures attest to the fact that that the information above on your After Visit Summary has been reviewed and is understood.  Full responsibility of the confidentiality of this discharge information lies with you and/or your care-partner.

## 2022-11-22 NOTE — Op Note (Signed)
[facility] Patient Name: Nicholas Johnson Procedure Date: 11/22/2022 9:48 AM MRN: [id number] Endoscopist: Ladene Artist , MD, [id number] Age: 32 Referring MD:  Date of Birth: [birth year] Gender: Male Account #: [id number] Procedure:                Upper GI endoscopy Indications:              Globus sensation, Sore throat, Neck pain Medicines:                Monitored Anesthesia Care Procedure:                Pre-Anesthesia Assessment:                           - Prior to the procedure, a History and Physical                            was performed, and patient medications and                            allergies were reviewed. The patient's tolerance of                            previous anesthesia was also reviewed. The risks                            and benefits of the procedure and the sedation                            options and risks were discussed with the patient.                            All questions were answered, and informed consent                            was obtained. Prior Anticoagulants: The patient has                            taken no anticoagulant or antiplatelet agents. ASA                            Grade Assessment: II - A patient with mild systemic                            disease. After reviewing the risks and benefits,                            the patient was deemed in satisfactory condition to                            undergo the procedure.                           After obtaining informed consent, the endoscope was  passed under direct vision. Throughout the                            procedure, the patient's blood pressure, pulse, and                            oxygen saturations were monitored continuously. The                            Endoscope was introduced through the mouth, and                            advanced to the second part of duodenum. The upper                            GI endoscopy  was accomplished without difficulty.                            The patient tolerated the procedure well. Scope In: Scope Out: Findings:                 The esophagus was normal. Biopsies were taken with                            a cold forceps for histology.                           The stomach was normal.                           The examined duodenum was normal.                           The cardia and gastric fundus were normal on                            retroflexion. Complications:            No immediate complications. Estimated Blood Loss:     Estimated blood loss: none. Impression:               - Normal esophagus. Biopsied.                           - Normal stomach.                           - Normal examined duodenum. Recommendation:           - Patient has a contact number available for                            emergencies. The signs and symptoms of potential                            delayed complications were discussed with the  patient. Return to normal activities tomorrow.                            Written discharge instructions were provided to the                            patient.                           - Resume previous diet.                           - Continue present medications.                           - Await pathology results.                           - No GI cause for symptoms was found.                           - Return to PCP, ENT for ongoing care. Ladene Artist, MD 11/22/2022 10:04:16 AM This report has been signed electronically.

## 2022-11-22 NOTE — Telephone Encounter (Signed)
PT liked sample and wants a RX called in Pls call @ [phone]   [facility] on [address]

## 2022-11-22 NOTE — ED Provider Triage Note (Signed)
Emergency Medicine Provider Triage Evaluation Note  Nicholas Johnson. , a 32 y.o. male  was evaluated in triage.  Pt complains of chest pain after an endoscopy today.  He notes that his chest pain is worse with inspiration.  Just had a scope done today.  Denies shortness of breath, nausea, vomiting.  Review of Systems  Positive:  Negative:   Physical Exam  BP 115/72 (BP Location: Left Arm)   Pulse 72   Temp 98.1 F (36.7 C) (Oral)   Resp 15   SpO2 100%  Gen:   Awake, no distress   Resp:  Normal effort  MSK:   Moves extremities without difficulty  Other:  Uncomfortable appearing  Medical Decision Making  Medically screening exam initiated at 1:09 PM.  Appropriate orders placed.  Nicholas Johnson. was informed that the remainder of the evaluation will be completed by another provider, this initial triage assessment does not replace that evaluation, and the importance of remaining in the ED until their evaluation is complete.  Workup initiated   Nicholas Johnson A, PA-C 11/22/22 1309

## 2022-11-22 NOTE — Telephone Encounter (Signed)
Pt called from ED, please refer to pulmonary note from 11/22/22.

## 2022-11-22 NOTE — Telephone Encounter (Signed)
Spoke with Dr. Fuller Plan  @11 :40 after being informed of phone call from patient. Dr. Fuller Plan informed that patient complaining of chest pain and a sore throat. Called patient @ 11:50 and informed patient that Dr. Fuller Plan is recommending patient go to the Emergency Department to be evaluated.  Patient understood and is planning to go to the ED. Told patient we will call him in the morning. Instructed patient to bring his report from EGD procedure today, B.Tyjae Issa RN.

## 2022-11-22 NOTE — Telephone Encounter (Signed)
Called pt to let him know of MD recommendations. Pt stated he has an appt with PCP, and ENT tomorrow. Advised pt to follow-up with PCP if chest/ abdominal pain persist after being discharged from ED. Pt verbalized understanding. Stated he will "keep up posted on his ED visit".

## 2022-11-22 NOTE — Progress Notes (Signed)
See 11/06/2022 H&P, no changes

## 2022-11-23 ENCOUNTER — Ambulatory Visit (INDEPENDENT_AMBULATORY_CARE_PROVIDER_SITE_OTHER): Payer: 59 | Admitting: Family Medicine

## 2022-11-23 ENCOUNTER — Telehealth: Payer: Self-pay | Admitting: Neurology

## 2022-11-23 ENCOUNTER — Telehealth: Payer: Self-pay | Admitting: Nurse Practitioner

## 2022-11-23 ENCOUNTER — Telehealth: Payer: Self-pay

## 2022-11-23 ENCOUNTER — Encounter (HOSPITAL_BASED_OUTPATIENT_CLINIC_OR_DEPARTMENT_OTHER): Payer: Self-pay | Admitting: Family Medicine

## 2022-11-23 VITALS — BP 117/74 | HR 63 | Ht 68.0 in | Wt 121.8 lb

## 2022-11-23 DIAGNOSIS — H5713 Ocular pain, bilateral: Secondary | ICD-10-CM | POA: Diagnosis not present

## 2022-11-23 DIAGNOSIS — Z09 Encounter for follow-up examination after completed treatment for conditions other than malignant neoplasm: Secondary | ICD-10-CM

## 2022-11-23 DIAGNOSIS — K219 Gastro-esophageal reflux disease without esophagitis: Secondary | ICD-10-CM | POA: Diagnosis not present

## 2022-11-23 DIAGNOSIS — R1314 Dysphagia, pharyngoesophageal phase: Secondary | ICD-10-CM | POA: Diagnosis not present

## 2022-11-23 NOTE — Telephone Encounter (Signed)
Pt sent the same message via mychart. I have fwd to Dr. Krista Blue for review and for her to address directly. Pt has several questions.

## 2022-11-23 NOTE — Telephone Encounter (Addendum)
  Follow up Call-     11/22/2022    9:36 AM  Call back number  Post procedure Call Back phone  # [phone]  Permission to leave phone message Yes     Patient questions:  Do you have a fever, pain , or abdominal swelling? No. Pain Score  0 *  Have you tolerated food without any problems? Yes.    Have you been able to return to your normal activities? Yes.    Do you have any questions about your discharge instructions: Diet   No. Medications  No. Follow up visit  No.  Do you have questions or concerns about your Care? No.  Actions: * If pain score is 4 or above: No action needed, pain <4.  Pt was seen in the ED yesterday.  Just woke up today so could not say how his pain is this morning.  No reason for pain related to EGD found.  Pt has appointment with an ENT and cardiologist coming up.

## 2022-11-23 NOTE — Telephone Encounter (Signed)
Returned patient call.  Patient reports having black stool but had stated it was occurring before his 11/22/2022 EGD at [facility].  Per Dr. Lynne Leader recommendations patient was informed to direct all future calls and questions to his PCP, ENT, and pulmonary practices as there were no signs of any complications and patient was seen in the ED post procedure 11/22/2022.  See all notes and encounters in EPIC.  Patient has been calling multiple times to direct lines in different departments in [place].  Questions and concerns have been addressed.

## 2022-11-23 NOTE — Telephone Encounter (Signed)
Called and left message for patient to call office back

## 2022-11-23 NOTE — Telephone Encounter (Signed)
Patient would like the nurse to call regarding his inhaler that was given to him at his last visit.  He states he believes he used it the wrong way and it ran out.  Please advise and call patient to discuss further at [phone]

## 2022-11-23 NOTE — Telephone Encounter (Signed)
Pt called stating that he is needing to discuss the symptoms he came to the office with because he thinks there was a misunderstanding.

## 2022-11-23 NOTE — Telephone Encounter (Signed)
Patient called back stating that he wanted to speak to the nurse regarding the EGD he had yesterday and the symptoms he is having.  Please advise.

## 2022-11-23 NOTE — Telephone Encounter (Signed)
-----  Message from Ladene Artist, MD sent at 11/23/2022  4:31 PM EST ----- Chong Sicilian, See today's phone note. We have not found a significant GI problem and his GI evaluation is complete. He is to return to his PCP. Do not schedule another appt with [place]. Please get the paper work to discharge him from the practice. He is making excessive contacts with [place] and shows no signs of stopping despite appropriate evaluation and reassurance.   MS

## 2022-11-23 NOTE — Telephone Encounter (Signed)
Following an uneventful EGD with esophageal biopsies yesterday he had chest pain and dyspnea over baseline and went to ED where he had a negative chest CT, CXR and blood work. He was sent home. He was advised to take Tylenol and antacids. There were no signs of complication or causality related to EGD. No active GI problems have been identified.  Recommend he follow up with his PCP, ENT and Pulmonary providers and that he directs calls, question and concerns to those providers.
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
## 2022-11-23 NOTE — Telephone Encounter (Addendum)
Patient called back states he was advised to give a nurse a call regarding having black stool, although he spoke with RN earlier today.

## 2022-11-23 NOTE — Telephone Encounter (Signed)
Patient calls to discuss his EGD. Patient is inquiring about what was found at his procedure yesterday. He tells me he had chest pain yesterday "to the point of tears" and was seen in the ER. Patient says everything was okay and he was sent home. He does not have any chest pain today. He reports a sensation of needing to burp and being unable to. He says his throat is sore, but he understands that is likely due to the scope. He is afebrile. No cough. Clears his throat some. He talks about how all of the issues have kept him from working. Admits the situation is frustrating and stressful.  I suggested warm fluids for the throat discomfort. Soft foods if he is hungry. Suggested he contact his PCP for guidance on next steps. Advised biopsies are pending and the EGD normal per the provider. Patient said he is looking into seeing a different ENT or a "sub specialist for speech."

## 2022-11-24 ENCOUNTER — Encounter: Payer: Self-pay | Admitting: Cardiovascular Disease

## 2022-11-24 ENCOUNTER — Ambulatory Visit (INDEPENDENT_AMBULATORY_CARE_PROVIDER_SITE_OTHER): Payer: 59 | Admitting: Cardiovascular Disease

## 2022-11-24 VITALS — BP 110/72 | HR 56 | Ht 68.0 in | Wt 122.2 lb

## 2022-11-24 DIAGNOSIS — M940 Chondrocostal junction syndrome [Tietze]: Secondary | ICD-10-CM | POA: Diagnosis not present

## 2022-11-24 DIAGNOSIS — R072 Precordial pain: Secondary | ICD-10-CM | POA: Diagnosis not present

## 2022-11-24 MED ORDER — IBUPROFEN 800 MG PO TABS: 800.0000 mg | ORAL_TABLET | Freq: Three times a day (TID) | ORAL | 0 refills | Status: DC

## 2022-11-24 NOTE — Patient Instructions (Signed)
Medication Instructions:  Take Ibuprofen 800 mg three times daily for 7 days (drink plenty of water, and be sure to eat)   *If you need a refill on your cardiac medications before your next appointment, please call your pharmacy*   Lab Work: ESR, CRP today   If you have labs (blood work) drawn today and your tests are completely normal, you will receive your results only by: [place] (if you have MyChart) OR A paper copy in the mail If you have any lab test that is abnormal or we need to change your treatment, we will call you to review the results.   Testing/Procedures: Echocardiogram - Your physician has requested that you have an echocardiogram. Echocardiography is a painless test that uses sound waves to create images of your heart. It provides your doctor with information about the size and shape of your heart and how well your heart's chambers and valves are working. This procedure takes approximately one hour. There are no restrictions for this procedure.     Follow-Up: At [facility], you and your health needs are our priority.  As part of our continuing mission to provide you with exceptional heart care, we have created designated Provider Care Teams.  These Care Teams include your primary Cardiologist (physician) and Advanced Practice Providers (APPs -  Physician Assistants and Nurse Practitioners) who all work together to provide you with the care you need, when you need it.  We recommend signing up for the patient portal called "MyChart".  Sign up information is provided on this After Visit Summary.  MyChart is used to connect with patients for Virtual Visits (Telemedicine).  Patients are able to view lab/test results, encounter notes, upcoming appointments, etc.  Non-urgent messages can be sent to your provider as well.   To learn more about what you can do with MyChart, go to [email or web address].    Your next appointment:   As needed  Provider:    Eleonore Chiquito, MD

## 2022-11-24 NOTE — Telephone Encounter (Signed)
Dismissal letter sent to Dr Fuller Plan for completion.

## 2022-11-25 LAB — SEDIMENTATION RATE: Sed Rate: 2 mm/hr (ref 0–15)

## 2022-11-25 LAB — C-REACTIVE PROTEIN: CRP: 6 mg/L (ref 0–10)

## 2022-11-27 ENCOUNTER — Other Ambulatory Visit: Payer: Self-pay

## 2022-11-27 ENCOUNTER — Telehealth: Payer: Self-pay | Admitting: Gastroenterology

## 2022-11-27 ENCOUNTER — Encounter: Payer: Self-pay | Admitting: Gastroenterology

## 2022-11-27 ENCOUNTER — Ambulatory Visit: Payer: 59 | Admitting: Internal Medicine

## 2022-11-27 NOTE — Telephone Encounter (Signed)
Are you okay with [place] sending in a prescription of Trelegy?

## 2022-11-27 NOTE — Telephone Encounter (Signed)
Nicholas Johnson please see the message below from the pt.  He has been discharged.

## 2022-11-27 NOTE — Telephone Encounter (Signed)
Incoming call from patient wishing to speak with [facility]

## 2022-11-27 NOTE — Telephone Encounter (Signed)
He can come pick up one sample of Trelegy 100. Please have one of the CMAs show him proper technique for the DPI. Thanks.

## 2022-11-27 NOTE — Addendum Note (Signed)
Addended by: Caprice Beaver T on: 11/27/2022 10:18 AM   Modules accepted: Orders

## 2022-11-28 ENCOUNTER — Inpatient Hospital Stay: Admission: RE | Admit: 2022-11-28 | Payer: 59 | Source: Ambulatory Visit

## 2022-11-28 MED ORDER — TRELEGY ELLIPTA 100-62.5-25 MCG/ACT IN AEPB: 1.0000 | INHALATION_SPRAY | Freq: Every day | RESPIRATORY_TRACT | 0 refills | Status: DC

## 2022-11-28 NOTE — Telephone Encounter (Signed)
Spoke with the pt and notified of response per Katie  Sample at front for pick up and he is aware to ask for clinical to show him how to use Nothing further needed

## 2022-11-29 ENCOUNTER — Encounter: Payer: Self-pay | Admitting: Gastroenterology

## 2022-11-29 ENCOUNTER — Telehealth: Payer: Self-pay | Admitting: Gastroenterology

## 2022-11-29 ENCOUNTER — Other Ambulatory Visit: Payer: 59

## 2022-11-29 NOTE — Telephone Encounter (Signed)
Scheduled pt for vv f/u with Sarah for 01/25/23 at 2:15pm and added appointment to wait list

## 2022-11-29 NOTE — Telephone Encounter (Signed)
Patient dismissed from [facility] and all providers at this practice. 11/24/22 yk

## 2022-12-04 ENCOUNTER — Telehealth: Payer: Self-pay | Admitting: Gastroenterology

## 2022-12-04 ENCOUNTER — Telehealth: Payer: Self-pay | Admitting: Nurse Practitioner

## 2022-12-04 DIAGNOSIS — R0989 Other specified symptoms and signs involving the circulatory and respiratory systems: Secondary | ICD-10-CM | POA: Insufficient documentation

## 2022-12-04 DIAGNOSIS — R1314 Dysphagia, pharyngoesophageal phase: Secondary | ICD-10-CM | POA: Diagnosis not present

## 2022-12-04 NOTE — Telephone Encounter (Signed)
I called and spoke with the patient.  I explained that the FMLA paperwork was filled out by the provider appropriately.  I explained that Nicoletta Ba, PA did not feel he needed any accommodations or intermittent leave beyond office visits.  He was provided work notes for those visits.  I explained that intermittent leave is not granted for every condition and the provider is the person that determines the appropriateness of the leave.  Amy did not feel his condition warrants intermittent FMLA.  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  I explained that we can't provide notes for days or times he was not here as a patient.  He is encouraged to reach out to his PCP if he feels he needs accommodations for his work and to cover "points " for days he did not work.

## 2022-12-04 NOTE — Telephone Encounter (Signed)
Patient called regarding a letter and\or paperwork that we had filled out for him to take to work he states they advised him the paperwork was filled out incorrectly and\or is incomplete. The patient would like a  call back to discuss further.

## 2022-12-04 NOTE — Telephone Encounter (Signed)
I returned the call to the pt and he tells me that he wants his FMLA paperwork completley filled out by our office because it was not done correctly.  He states that [employer] PA did not complete the forms correctly while under her care and this is to protect his job and it needs to be done. I advised him that since he was formally discharged from our practice we are not obligated to complete any paperwork and only emergency care in the ED can be provided.  He states I did nothing wrong to be dismissed and states I called to talk to Barb Merino not you. He says I spoke with Barbera Setters already and she is aware of what I need.  I did tell him I will pass this message on to Barb Merino at this time.    [employer]

## 2022-12-05 DIAGNOSIS — L538 Other specified erythematous conditions: Secondary | ICD-10-CM | POA: Diagnosis not present

## 2022-12-05 DIAGNOSIS — J387 Other diseases of larynx: Secondary | ICD-10-CM | POA: Diagnosis not present

## 2022-12-05 DIAGNOSIS — K219 Gastro-esophageal reflux disease without esophagitis: Secondary | ICD-10-CM | POA: Diagnosis not present

## 2022-12-05 DIAGNOSIS — R0989 Other specified symptoms and signs involving the circulatory and respiratory systems: Secondary | ICD-10-CM | POA: Diagnosis not present

## 2022-12-05 DIAGNOSIS — R1319 Other dysphagia: Secondary | ICD-10-CM | POA: Diagnosis not present

## 2022-12-05 NOTE — Telephone Encounter (Signed)
Attempted to call pt but unable to reach. Left a detailed message for pt letting him know that his current scheduled appt is the soonest we had available. Nothing further needed.

## 2022-12-05 NOTE — Telephone Encounter (Signed)
See below. We have provided all paperwork we will provide.

## 2022-12-05 NOTE — Telephone Encounter (Signed)
Incoming call from patient states he needs this paperwork filled out and he can come up here if he needs to. Please advise

## 2022-12-06 ENCOUNTER — Ambulatory Visit (HOSPITAL_BASED_OUTPATIENT_CLINIC_OR_DEPARTMENT_OTHER): Payer: 59 | Admitting: Family Medicine

## 2022-12-06 ENCOUNTER — Telehealth: Payer: Self-pay | Admitting: Nurse Practitioner

## 2022-12-06 ENCOUNTER — Telehealth: Payer: Self-pay | Admitting: Pulmonary Disease

## 2022-12-06 NOTE — Telephone Encounter (Signed)
Called and spoke with pt letting him know the info per [facility] and he verbalized understanding. Nothing further needed.

## 2022-12-06 NOTE — Telephone Encounter (Signed)
Patient is returning phone call. Patient phone number is [phone].

## 2022-12-06 NOTE — Telephone Encounter (Signed)
Called and spoke with patient. Patient has been having problems with shortness of breath for about a month and a half. Patient stated he has been between our office and ENT trying to figure out why he's having shortness of breath. Patient stated he is also noticing that it is getting worse. He also stated that he's been taking medication from the GI for 2 weeks now and said so far it isn't helping.  Patients wants to know if Marland Kitchen has any suggestions.   [place], please advise.

## 2022-12-06 NOTE — Telephone Encounter (Signed)
ATC X1 LVM for patient to call the office back

## 2022-12-06 NOTE — Telephone Encounter (Signed)
He has had extensive workup during multiple ED visits which has been unrevealing. He had two sets of normal pulmonary function testing. He also had workup with ENT, which was all unremarkable aside from some possible mild muscle tension related dysphagia. He was recommended to take PPI for possible GERD by GI. Would follow up with them or PCP if no improvement. We had recommended he trial Trelegy to see if he had any benefit with this. If he has not had any response to this, unlikely that this is a pulmonary problem with his normal testing and imaging. From his past reports, his shortness of breath is primarily only with eating/swallowing. I'm concerned some of his symptoms could be related to anxiety so I would recommend a referral to behavioral health as well. If he develops worsening symptoms, he should go to the ED. Thanks.

## 2022-12-08 ENCOUNTER — Ambulatory Visit: Payer: 59 | Admitting: Internal Medicine

## 2022-12-08 NOTE — Telephone Encounter (Signed)
No further recommendations at this point. All of his imaging and testing has been normal thus far. He should keep the appointment with ENT/speech. If the Trelegy has not provided him with any benefit, he can trial off of it. If he develops worsening symptoms, he should go to the ED. Thanks.

## 2022-12-08 NOTE — Telephone Encounter (Signed)
Spoke with the pt and notified him of Katie's response. He verbalized understanding. Nothing further needed.

## 2022-12-08 NOTE — Telephone Encounter (Signed)
Spoke with the Nicholas Johnson  He states that he continues with have difficulty with his breathing  He is using his trelegy inhaler daily as directed  He states that he has the feeling that he is not getting a good, deep breath  He states "the air is not circulating and getting me enough oxygen"  He has also had ongoing tingling around his eyes and lips that comes and goes  He had some dysphagia yesterday, and says he has occ discomfort "under my jaw towards my lymph nodes" He denies throat swelling States he has seen ENT and they told him they wanted him to see the [place] and he is scheduled for this next wk  He is also scheduled to see Joellen Jersey on 12/13/22  He says he has reached out to PCP and they advised his PCP out of the office until mid March 2024  Katie, can you please advise any additional recommendations for him at this point? Thanks!

## 2022-12-08 NOTE — Telephone Encounter (Signed)
Patient called to let the nurse know that he is still having problems breathing and wants to know what she would suggest.  He said he also has tingling in his face and eyes.  He is not sure if he should go to the hospital.  Please call to discuss at [phone]

## 2022-12-11 ENCOUNTER — Institutional Professional Consult (permissible substitution): Payer: 59 | Admitting: Neurology

## 2022-12-12 ENCOUNTER — Telehealth: Payer: Self-pay | Admitting: Pulmonary Disease

## 2022-12-13 ENCOUNTER — Encounter (HOSPITAL_BASED_OUTPATIENT_CLINIC_OR_DEPARTMENT_OTHER): Payer: Self-pay | Admitting: Family Medicine

## 2022-12-13 ENCOUNTER — Ambulatory Visit (INDEPENDENT_AMBULATORY_CARE_PROVIDER_SITE_OTHER): Payer: 59 | Admitting: Nurse Practitioner

## 2022-12-13 VITALS — BP 112/78 | HR 74 | Ht 63.0 in | Wt 117.2 lb

## 2022-12-13 DIAGNOSIS — G4719 Other hypersomnia: Secondary | ICD-10-CM | POA: Diagnosis not present

## 2022-12-13 DIAGNOSIS — G479 Sleep disorder, unspecified: Secondary | ICD-10-CM

## 2022-12-13 DIAGNOSIS — R0989 Other specified symptoms and signs involving the circulatory and respiratory systems: Secondary | ICD-10-CM | POA: Diagnosis not present

## 2022-12-13 DIAGNOSIS — R0683 Snoring: Secondary | ICD-10-CM

## 2022-12-13 DIAGNOSIS — R0602 Shortness of breath: Secondary | ICD-10-CM | POA: Diagnosis not present

## 2022-12-13 NOTE — Telephone Encounter (Signed)
Patient called requesting we provide him with a referral to see another GI provider was advised that he would need to speak with his PCP regarding a referral to GI and then we would be able to submit his records over unless he wanted to obtain records for himself he would be able to request them through medical records. The patient got upset and said 'NO the referral should come from your office". He was just not accepting anything that was being said to said. He then asked to speak with an office manager so the call was transferred.

## 2022-12-13 NOTE — Progress Notes (Signed)
$'@Patient'T$  ID: Nicholas Johnson., male    DOB: 1991-09-29, 32 y.o.   MRN: [id number]  Chief Complaint  Patient presents with   Follow-up    Pt f/u difficulty swallowing, throat irritation. Currently taking omeprazole '80mg'$  daily. Appt w/ ENT today w/ his voice troubles    Referring provider: [place], Blondell Reveal, MD  HPI: 32 year old male, never smoker followed for DOE.  He is a patient of Dr. Janee Morn and last seen 11/20/2022 by Belenda Cruise NP. Past medical history significant for migraine headaches, psychosis, dysphagia.  TEST/EVENTS:  01/29/2021 CT maxillofacial: normal  01/29/2021 CT chest: lungs are clear. No LAD 11/12/2022 CT neck soft tissue: normal CT 11/08/2022 barium swallow: normal 11/19/2022 CXR: hyperinflation of the lungs, unchanged. Otherwise, clear and without acute process.  11/16/2022 PFT: FVC 93, FEV1 92, ratio 81  11/13/2022: OV with Dr. Annamaria Boots for hospital follow-up after ED visits on 1/15, 1/17 and 1/20.  Complaints of trouble swallowing, having a sensation of something stuck in his throat for the past year.  Seen in the ED, by his PCP, ENT and GI in the last month for the same.  ENT did a laryngoscopy which was normal.  He had a barium swallow that was normal.  His CT of his neck was also normal.  Chest x-ray without any active disease.  Awaiting MRI of the brain and thyroid ultrasound.  Does have some sensation of difficulty getting air to move through back of nasopharynx and throat.  No history of asthma or lung disease.  Complains of mainly upper airway and felt to be nonallopathic.  Trial of Trelegy for dyspnea.  PFTs ordered for further evaluation.  Advised on measures to soothe the throat.  11/20/2022: OV with Adellyn Capek NP for follow-up after pulmonary function testing.  Unfortunately, he was only able to complete the spirometry portion of the testing.  Lung function was normal from this.  Since he was here last, he has been seen in the ED numerous times, most recently yesterday 1/28.  He  was having some reports of intermittent chest pain, which she described as indigestion.  Workup was unrevealing with negative troponins and clear chest x-ray.  He was prescribed Carafate instructed to keep his appointment with GI.  Today, he tells me that he feels relatively unchanged compared to when he was here last.  He continues to feel like he has trouble moving air, sensation of something stuck in his throat, difficulty swallowing and frequent throat clearing.  He feels that a lot of his symptoms are related to his sinuses but has been told by ENT that his findings were normal. He is planned to have EGD next week. He is also having a thyroid ultrasound today. He never tried the Trelegy prescribed previously. He is trying to limit the medications he is on so he has discontinued most prescribed therapies aside from his omeprazole. He wants to repeat PFTs.   12/13/2022: Today - follow up Patient presents today for follow up. His symptoms are relatively unchanged compared to previous visit. Pulmonary workup has been normal. He tried Trelegy consistently for the past few weeks and did not have any perceived benefit. No benefit from albuterol in past either. He tells me he feels like his symptoms come from his neck/upper airway, not his lungs. He has been cleared by GI per chart review. He is being seen by ENT as well as laryngology specialist with no structural abnormalities. He was referred to speech therapy for laryngeal hygiene, respiratory  training and laryngeal control. He was encouraged by myself and ENT to seek support from mental health counselor due to stress/frustration/anxiety related to symptoms. He has yet to do this.  He does wonder if some of his symptoms are related to sleep apnea. He has been told he snores in the past. He has restless sleep at night; cannot sleep longer than 3 hours at a time. He wakes feeling poorly rested and is tired during the day. No witnessed apneas. Denies drowsy  driving, sleep parasomnias/paralysis. No symptoms of narcolepsy or cataplexy.  He goes to bed around 12-1am. Falls asleep relatively quickly. Wakes a few times a night, always between 2-3 am. Jacqlyn Krauss gets up around 7-8 am. No sleep aids.   Epworth 5  Allergies  Allergen Reactions   Corn-Containing Products     Unsure of the reaction   Peanut-Containing Drug Products    Wheat Bran      There is no immunization history on file for this patient.  Past Medical History:  Diagnosis Date   Nasal congestion    Parotid gland pain    Psychosis ([place])    admitted 12/15    Tobacco History: Social History   Tobacco Use  Smoking Status Never  Smokeless Tobacco Never   Counseling given: Not Answered   Outpatient Medications Prior to Visit  Medication Sig Dispense Refill   albuterol (VENTOLIN HFA) 108 (90 Base) MCG/ACT inhaler Inhale 2 puffs into the lungs every 6 (six) hours as needed for wheezing or shortness of breath. 8 g 2   cetirizine (ZYRTEC ALLERGY) 10 MG tablet Take 1 tablet (10 mg total) by mouth daily. 30 tablet 2   fluticasone (FLONASE) 50 MCG/ACT nasal spray Place 1 spray into both nostrils daily. 1 g 0   Fluticasone-Umeclidin-Vilant (TRELEGY ELLIPTA) 100-62.5-25 MCG/ACT AEPB Inhale 1 puff into the lungs daily. 28 each 0   Fluticasone-Umeclidin-Vilant (TRELEGY ELLIPTA) 100-62.5-25 MCG/ACT AEPB Inhale 1 puff into the lungs daily. 14 each 0   hyoscyamine (LEVSIN SL) 0.125 MG SL tablet 1 tablet by mouth 30 minutes before meals 3 times a day 90 tablet 0   ibuprofen (ADVIL) 800 MG tablet Take 1 tablet (800 mg total) by mouth 3 (three) times daily. 21 tablet 0   lamoTRIgine (LAMICTAL) 100 MG tablet Take 1 tablet (100 mg total) by mouth daily. 30 tablet 5   lamoTRIgine (LAMICTAL) 25 MG tablet One tab qhs xone week 2 tabs qhs xone week 3 tabs qhs xone week 90 tablet 0   magic mouthwash w/lidocaine SOLN Take 5 mLs by mouth 4 (four) times daily. 240 mL 0   sucralfate (CARAFATE) 1  g tablet dissolve in 10cc of water and take as a slurry between meals and bedtime 120 tablet 1   SUMAtriptan (IMITREX) 50 MG tablet May repeat in 2 hours if headache persists or recurs. 10 tablet 6   omeprazole (PRILOSEC) 20 MG capsule Take 1 capsule (20 mg total) by mouth 2 (two) times daily before a meal for 14 days. (Patient taking differently: Take 80 mg by mouth 2 (two) times daily before a meal.) 28 capsule 0   sucralfate (CARAFATE) 1 g tablet Take 1 tablet (1 g total) by mouth 4 (four) times daily -  with meals and at bedtime for 14 days. 56 tablet 0   No facility-administered medications prior to visit.     Review of Systems:   Constitutional: No night sweats, fevers, chills, fatigue, or lassitude. +weight loss HEENT: No tooth/dental problems, or  sore throat. No sneezing, itching, ear ache. +difficulty swallowing, nasal congestion, post nasal drip, globus sensation; frequent throat clearing, headaches CV:  No active chest pain, orthopnea, PND, swelling in lower extremities, anasarca, dizziness, palpitations, syncope Resp: +shortness of breath with exertion. No excess mucus or change in color of mucus. No productive or non-productive. No hemoptysis. No wheezing.  No chest wall deformity GI:  +heartburn, indigestion, decreased appetite. No abdominal pain, nausea, vomiting, diarrhea, change in bowel habits, loss of appetite, bloody stools GU: No dysuria, change in color of urine, urgency or frequency.   Skin: No rash, lesions, ulcerations MSK:  No joint pain or swelling.   Neuro: No dizziness or lightheadedness.  Psych: +anxiety. No depression. Mood stable.     Physical Exam:  BP 112/78   Pulse 74   Ht '5\' 3"'$  (1.6 m)   Wt 117 lb 3.2 oz (53.2 kg)   SpO2 98%   BMI 20.76 kg/m   GEN: Pleasant, interactive, well-appearing; in no acute distress. HEENT:  Normocephalic and atraumatic. PERRLA. Sclera white. Nasal turbinates pink, moist and patent bilaterally. No rhinorrhea present.  Oropharynx pink and moist, without exudate or edema. No lesions, ulcerations NECK:  Supple w/ fair ROM. No JVD present. Normal carotid impulses w/o bruits. Thyroid symmetrical with no goiter or nodules palpated. No lymphadenopathy.   CV: RRR, no m/r/g, no peripheral edema. Pulses intact, +2 bilaterally. No cyanosis, pallor or clubbing. PULMONARY:  Unlabored, regular breathing. Clear bilaterally A&P w/o wheezes/rales/rhonchi. No accessory muscle use.  GI: BS present and normoactive. Soft, non-tender to palpation. No organomegaly or masses detected.  MSK: No erythema, warmth or tenderness. Cap refil <2 sec all extrem. No deformities or joint swelling noted.  Neuro: A/Ox3. No focal deficits noted.   Skin: Warm, no lesions or rashe Psych: Normal affect and behavior. Judgement and thought content appropriate.     Lab Results:  CBC    Component Value Date/Time   WBC 4.1 11/22/2022 1300   RBC 5.14 11/22/2022 1300   HGB 14.6 11/22/2022 1316   HCT 43.0 11/22/2022 1316   PLT 216 11/22/2022 1300   MCV 81.1 11/22/2022 1300   MCH 28.0 11/22/2022 1300   MCHC 34.5 11/22/2022 1300   RDW 12.5 11/22/2022 1300   LYMPHSABS 1.2 11/13/2022 1422   MONOABS 0.3 11/13/2022 1422   EOSABS 0.0 11/13/2022 1422   BASOSABS 0.0 11/13/2022 1422    BMET    Component Value Date/Time   NA 140 11/22/2022 1316   K 4.2 11/22/2022 1316   CL 100 11/22/2022 1316   CO2 27 11/22/2022 1300   GLUCOSE 89 11/22/2022 1316   BUN <3 (L) 11/22/2022 1316   CREATININE 0.80 11/22/2022 1316   CREATININE 1.02 10/14/2019 1456   CALCIUM 9.7 11/22/2022 1300   GFRNONAA >60 11/22/2022 1300   GFRNONAA 100 10/14/2019 1456   GFRAA 116 10/14/2019 1456    BNP No results found for: "BNP"   Imaging:  CT Chest W Contrast  Result Date: 11/22/2022 CLINICAL DATA:  Chest pain after upper endoscopy earlier today. EXAM: CT CHEST WITH CONTRAST TECHNIQUE: Multidetector CT imaging of the chest was performed during intravenous contrast  administration. RADIATION DOSE REDUCTION: This exam was performed according to the departmental dose-optimization program which includes automated exposure control, adjustment of the mA and/or kV according to patient size and/or use of iterative reconstruction technique. CONTRAST:  70m OMNIPAQUE IOHEXOL 300 MG/ML  SOLN COMPARISON:  Prior CT of the chest on 01/29/2021 FINDINGS: Cardiovascular: The thoracic aorta is  normal and demonstrates no atherosclerosis, aneurysm or dissection. The heart size is normal. No pericardial fluid identified. No evidence of calcified coronary artery plaque. Central pulmonary arteries are normal in caliber. Mediastinum/Nodes: No enlarged mediastinal, hilar, or axillary lymph nodes. Thyroid gland, trachea, and esophagus demonstrate no significant findings. No pneumomediastinum. The patient swallowed oral contrast just prior to the scan and there is no evidence of extravasated oral contrast to suggest esophageal perforation. Lungs/Pleura: There is no evidence of pulmonary edema, consolidation, pneumothorax, nodule or pleural fluid. Upper Abdomen: No acute abnormality. Musculoskeletal: No chest wall abnormality. No acute or significant osseous findings. IMPRESSION: Normal CT of the chest with contrast. No evidence of pneumomediastinum or extravasated oral contrast to suggest esophageal perforation. Electronically Signed   By: Aletta Edouard M.D.   On: 11/22/2022 16:49   DG Chest Port 1 View  Result Date: 11/22/2022 CLINICAL DATA:  Chest pain following endoscopy. EXAM: PORTABLE CHEST 1 VIEW COMPARISON:  Chest x-ray 11/19/2022 FINDINGS: The cardiac silhouette, mediastinal and hilar contours are within normal limits. The lungs are clear. No infiltrates, edema, effusions or pneumothorax. I do not see any air under the hemidiaphragms. The bony thorax is intact. IMPRESSION: 1. No acute cardiopulmonary findings. 2. No evidence of free intraperitoneal air. Electronically Signed   By: Marijo Sanes M.D.   On: 11/22/2022 13:10   US THYROID  Result Date: 11/21/2022 CLINICAL DATA:  Anterior thyroid tenderness EXAM: THYROID ULTRASOUND TECHNIQUE: Ultrasound examination of the thyroid gland and adjacent soft tissues was performed. COMPARISON:  11/12/2022 FINDINGS: Parenchymal Echotexture: Normal Isthmus: 2 mm Right lobe: 4.4 x 1.4 x 1.3 cm Left lobe: 4.6 x 1.3 x 1.3 cm _________________________________________________________ Estimated total number of nodules >/= 1 cm: 0 Number of spongiform nodules >/=  2 cm not described below (TR1): 0 Number of mixed cystic and solid nodules >/= 1.5 cm not described below (TR2): 0 _________________________________________________________ No discrete nodules are seen within the thyroid gland. IMPRESSION: Normal thyroid ultrasound. The above is in keeping with the ACR TI-RADS recommendations - J Am Coll Radiol 2017;14:587-595. Electronically Signed   By: Jerilynn Mages.  Shick M.D.   On: 11/21/2022 16:01   DG Chest 2 View  Result Date: 11/19/2022 CLINICAL DATA:  Chest pain. EXAM: CHEST - 2 VIEW COMPARISON:  11/14/2022 FINDINGS: The cardiomediastinal contours are normal. Similar hyperinflation. Pulmonary vasculature is normal. No consolidation, pleural effusion, or pneumothorax. No acute osseous abnormalities are seen. IMPRESSION: Unchanged hyperinflation.  No acute findings. Electronically Signed   By: Keith Rake M.D.   On: 11/19/2022 19:38         Latest Ref Rng & Units 11/20/2022    1:06 PM 11/16/2022   10:43 AM  PFT Results  FVC-Pre L 4.84  4.79   FVC-Predicted Pre % 94  93   Pre FEV1/FVC % % 82  81   FEV1-Pre L 3.98  3.88   FEV1-Predicted Pre % 94  92     No results found for: "NITRICOXIDE"      Assessment & Plan:   Dyspnea He has symptoms of dysphagia, dysphonia, and subjective shortness of breath. He has had extensive pulmonary, GI, and ENT workup without abnormalities. He also did not have any perceived benefit to inhaler therapies which  leaves asthma as unlikely diagnosis. Everything thus far has been reassuring. Encouraged to continue working with speech therapy on laryngeal hygiene/training. Hopefully, this will provide him with some relief. Lower suspicion symptoms are related to untreated OSA but he does have symptoms of snoring  and daytime fatigue so we will complete HST for further evaluation. I again encouraged him to seek mental health counseling as his symptoms are causing stress/frustration, and anxiety can further exacerbate some of these.   Patient Instructions  Ok to stay off Trelegy since you didn't receive any benefit from use  Your lung testing and all of your imaging have showed normal lung function/clear lungs, which is good news. No evidence of asthma or other lung disease at this point.   Given your symptoms of snoring and daytime sleepiness, I am concerned that you could have sleep disordered breathing with sleep apnea. You will need a home sleep study for further evaluation. Someone will contact you to schedule this.  We discussed how untreated sleep apnea puts an individual at risk for cardiac arrhthymias, pulm HTN, DM, stroke and increases their risk for daytime accidents. We also briefly reviewed treatment options including weight loss, side sleeping position, oral appliance, CPAP therapy or referral to ENT for possible surgical options if you were to have sleep apnea  Follow up with ENT as scheduled  Follow up in 8 weeks with Dr. Annamaria Boots or Jo[facility] Jersey Halvor Behrend,NP to discuss sleep study results. If symptoms do not improve or worsen, please contact office for sooner follow up or seek emergency care.    Excessive daytime sleepiness He has snoring, excessive daytime sleepiness, restless sleep, unexplained daytime dyspnea. Epworth 5. Given this,  it is possible he could have sleep disordered breathing with obstructive sleep apnea. He will need sleep study for further evaluation.    - had an extensive discussion  regarding the adverse health consequences related to untreated sleep disordered breathing - specifically discussed the risks for hypertension, coronary artery disease, cardiac dysrhythmias, cerebrovascular disease, and diabetes - lifestyle modification discussed   - discussed how sleep disruption can increase risk of accidents, particularly when driving - safe driving practices were discussed    I spent 35 minutes of dedicated to the care of this patient on the date of this encounter to include pre-visit review of records, face-to-face time with the patient discussing conditions above, post visit ordering of testing, clinical documentation with the electronic health record, making appropriate referrals as documented, and communicating necessary findings to members of the patients care team.  Clayton Bibles, NP 12/15/2022  Pt aware and understands NP's role.

## 2022-12-13 NOTE — Patient Instructions (Addendum)
Ok to stay off Trelegy since you didn't receive any benefit from use  Your lung testing and all of your imaging have showed normal lung function/clear lungs, which is good news. No evidence of asthma or other lung disease at this point.   Given your symptoms of snoring and daytime sleepiness, I am concerned that you could have sleep disordered breathing with sleep apnea. You will need a home sleep study for further evaluation. Someone will contact you to schedule this.  We discussed how untreated sleep apnea puts an individual at risk for cardiac arrhthymias, pulm HTN, DM, stroke and increases their risk for daytime accidents. We also briefly reviewed treatment options including weight loss, side sleeping position, oral appliance, CPAP therapy or referral to ENT for possible surgical options if you were to have sleep apnea  Follow up with ENT as scheduled  Follow up in 8 weeks with Dr. Annamaria Boots or Joellen Jersey Halvor Behrend,NP to discuss sleep study results. If symptoms do not improve or worsen, please contact office for sooner follow up or seek emergency care.

## 2022-12-13 NOTE — Assessment & Plan Note (Addendum)
He has symptoms of dysphagia, dysphonia, and subjective shortness of breath. He has had extensive pulmonary, GI, and ENT workup without abnormalities. He also did not have any perceived benefit to inhaler therapies which leaves asthma as unlikely diagnosis. Everything thus far has been reassuring. Encouraged to continue working with speech therapy on laryngeal hygiene/training. Hopefully, this will provide him with some relief. Lower suspicion symptoms are related to untreated OSA but he does have symptoms of snoring and daytime fatigue so we will complete HST for further evaluation. I again encouraged him to seek mental health counseling as his symptoms are causing stress/frustration, and anxiety can further exacerbate some of these.   Patient Instructions  Ok to stay off Trelegy since you didn't receive any benefit from use  Your lung testing and all of your imaging have showed normal lung function/clear lungs, which is good news. No evidence of asthma or other lung disease at this point.   Given your symptoms of snoring and daytime sleepiness, I am concerned that you could have sleep disordered breathing with sleep apnea. You will need a home sleep study for further evaluation. Someone will contact you to schedule this.  We discussed how untreated sleep apnea puts an individual at risk for cardiac arrhthymias, pulm HTN, DM, stroke and increases their risk for daytime accidents. We also briefly reviewed treatment options including weight loss, side sleeping position, oral appliance, CPAP therapy or referral to ENT for possible surgical options if you were to have sleep apnea  Follow up with ENT as scheduled  Follow up in 8 weeks with Dr. Annamaria Boots or Jo[facility] Allin Frix,NP to discuss sleep study results. If symptoms do not improve or worsen, please contact office for sooner follow up or seek emergency care.

## 2022-12-13 NOTE — Assessment & Plan Note (Addendum)
He has snoring, excessive daytime sleepiness, restless sleep, unexplained daytime dyspnea. Epworth 5. Given this,  it is possible he could have sleep disordered breathing with obstructive sleep apnea. He will need sleep study for further evaluation.    - had an extensive discussion regarding the adverse health consequences related to untreated sleep disordered breathing - specifically discussed the risks for hypertension, coronary artery disease, cardiac dysrhythmias, cerebrovascular disease, and diabetes - lifestyle modification discussed   - discussed how sleep disruption can increase risk of accidents, particularly when driving - safe driving practices were discussed

## 2022-12-15 ENCOUNTER — Ambulatory Visit: Payer: 59 | Admitting: Nurse Practitioner

## 2022-12-15 ENCOUNTER — Ambulatory Visit
Admission: RE | Admit: 2022-12-15 | Discharge: 2022-12-15 | Disposition: A | Discharge status: Home / Self Care | Payer: 59 | Source: Ambulatory Visit | Attending: Neurology | Admitting: Neurology

## 2022-12-15 ENCOUNTER — Ambulatory Visit: Payer: 59 | Admitting: Family

## 2022-12-15 ENCOUNTER — Encounter: Payer: Self-pay | Admitting: Nurse Practitioner

## 2022-12-15 DIAGNOSIS — G4452 New daily persistent headache (NDPH): Secondary | ICD-10-CM

## 2022-12-15 DIAGNOSIS — M542 Cervicalgia: Secondary | ICD-10-CM

## 2022-12-15 DIAGNOSIS — G43709 Chronic migraine without aura, not intractable, without status migrainosus: Secondary | ICD-10-CM

## 2022-12-15 NOTE — Telephone Encounter (Signed)
32 year old male who called in for shortness of breath.  He states that he was going in for an MRI of the brain for evaluation of a headache.  He recants numerous symptoms including hard time focusing, tingling in his arms, legs, face, and right lateral chest.  He feels that his facial muscles get tight and when he laid back he had increased work of breathing.  He has not had the same increased work of breathing while laying in bed or relaxing at other times.  He states that after the MRI, symptoms did not improve significantly.    Patient is able to speak rapidly without difficulty in complete paragraphs.  He has no difficulty completing sentences and is extremely frustrated that he has not been able to get a "pulmonary "diagnosis to explain his shortness of breath which has been persistent.  He states that he was unable to complete his pulmonary function tests because of similar symptoms.  He states he has had nonspecific chest pain, ENT findings, and he feels that the hospital system is not able to offer him appropriate emergency testing.  He denies a history of anxiety.  Denies a history for substance use.  I offered an emergency evaluation at the emergency department to which she stated that "they are not able to do anything and had a lot of spend 8 to 10 hours waiting for them to do a chest x-ray".  He stated that he would go to an urgent care before going to an emergency department.  I recommended that he follow-up with his primary pulmonary team, neuroteam, primary care.  I also questioned about his interest in pursuing psychiatric evaluation given the degree of anxiety in his voice and numerous somatic symptoms with no clear structural or functional etiology identified, but he did not request follow-up.  Ultimately, I recounseled him on the use of emergency care and after hours telephone line-unfortunately we are not able to spend extensive amount of time reviewing his history over the phone-this  would be better performed in a clinic setting or emergency room setting.  He is not interested in any intervention but did voice his frustration on multiple occasions.   -- Levie Heritage, MD Pulmonary and Critical [facility] On call

## 2022-12-18 ENCOUNTER — Encounter: Payer: Self-pay | Admitting: Cardiovascular Disease

## 2022-12-18 ENCOUNTER — Telehealth: Payer: Self-pay | Admitting: Nurse Practitioner

## 2022-12-18 ENCOUNTER — Telehealth: Payer: Self-pay | Admitting: Adult Health

## 2022-12-18 ENCOUNTER — Ambulatory Visit (HOSPITAL_COMMUNITY): Payer: 59 | Attending: Cardiology

## 2022-12-18 ENCOUNTER — Ambulatory Visit (HOSPITAL_BASED_OUTPATIENT_CLINIC_OR_DEPARTMENT_OTHER): Payer: 59

## 2022-12-18 DIAGNOSIS — Z0000 Encounter for general adult medical examination without abnormal findings: Secondary | ICD-10-CM | POA: Diagnosis not present

## 2022-12-18 DIAGNOSIS — R072 Precordial pain: Secondary | ICD-10-CM | POA: Insufficient documentation

## 2022-12-18 DIAGNOSIS — R0602 Shortness of breath: Secondary | ICD-10-CM

## 2022-12-18 LAB — ECHOCARDIOGRAM COMPLETE
Area-P 1/2: 3.24 cm2
S' Lateral: 3 cm

## 2022-12-18 NOTE — Telephone Encounter (Signed)
Patient with call after hours -complains that his breathing is no better. Has been evaluated by Dr. Annamaria Boots  and Belenda Cruise NP. Continue to have shortness of breath , tightness, weakness. Seen last week, Trelegy stopped. Has not tried Albuterol inhaler . No fever, discolored mucus or hemoptysis . Symptoms have been going on for weeks and not getting any better.  Advised to try Albuterol inhaler . Rx called to his pharm .  Will send message to Dr. Annamaria Boots  and Belenda Cruise NP for any other suggestions. Will need to be seen this week for evaluation . Has ov with [name] NP this week on 2/29 .  Advised if sx do not improve or worsen will need to seek care in the ER.  Please contact office for sooner follow up if symptoms do not improve or worsen or seek emergency care

## 2022-12-18 NOTE — Telephone Encounter (Signed)
Called patient and he states that he is wanting to possibly to the sleep study done in lab. He states he is not wanting to wait for 12 weeks for the HST.   Please advise

## 2022-12-18 NOTE — Telephone Encounter (Signed)
Patient would like to discuss home sleep test. Patient phone number is [phone].

## 2022-12-19 ENCOUNTER — Ambulatory Visit (INDEPENDENT_AMBULATORY_CARE_PROVIDER_SITE_OTHER): Payer: 59 | Admitting: Neurology

## 2022-12-19 ENCOUNTER — Encounter: Payer: Self-pay | Admitting: Neurology

## 2022-12-19 VITALS — BP 121/69 | HR 73 | Ht 68.0 in | Wt 120.5 lb

## 2022-12-19 DIAGNOSIS — G43709 Chronic migraine without aura, not intractable, without status migrainosus: Secondary | ICD-10-CM | POA: Diagnosis not present

## 2022-12-19 DIAGNOSIS — S134XXD Sprain of ligaments of cervical spine, subsequent encounter: Secondary | ICD-10-CM

## 2022-12-19 DIAGNOSIS — G4452 New daily persistent headache (NDPH): Secondary | ICD-10-CM | POA: Diagnosis not present

## 2022-12-19 LAB — CBC WITH DIFFERENTIAL/PLATELET
Basophils Absolute: 0 10*3/uL (ref 0.0–0.2)
Basos: 1 %
EOS (ABSOLUTE): 0 10*3/uL (ref 0.0–0.4)
Eos: 1 %
Hematocrit: 40.5 % (ref 37.5–51.0)
Hemoglobin: 14.1 g/dL (ref 13.0–17.7)
Immature Grans (Abs): 0 10*3/uL (ref 0.0–0.1)
Immature Granulocytes: 0 %
Lymphocytes Absolute: 1.3 10*3/uL (ref 0.7–3.1)
Lymphs: 43 %
MCH: 29.2 pg (ref 26.6–33.0)
MCHC: 34.8 g/dL (ref 31.5–35.7)
MCV: 84 fL (ref 79–97)
Monocytes Absolute: 0.3 10*3/uL (ref 0.1–0.9)
Monocytes: 10 %
Neutrophils Absolute: 1.4 10*3/uL (ref 1.4–7.0)
Neutrophils: 45 %
Platelets: 195 10*3/uL (ref 150–450)
RBC: 4.83 x10E6/uL (ref 4.14–5.80)
RDW: 13.1 % (ref 11.6–15.4)
WBC: 3 10*3/uL — ABNORMAL LOW (ref 3.4–10.8)

## 2022-12-19 LAB — COMPREHENSIVE METABOLIC PANEL
ALT: 10 IU/L (ref 0–44)
AST: 14 IU/L (ref 0–40)
Albumin/Globulin Ratio: 2.1 (ref 1.2–2.2)
Albumin: 4.8 g/dL (ref 4.3–5.2)
Alkaline Phosphatase: 55 IU/L (ref 44–121)
BUN/Creatinine Ratio: 4 — ABNORMAL LOW (ref 9–20)
BUN: 4 mg/dL — ABNORMAL LOW (ref 6–20)
Bilirubin Total: 0.3 mg/dL (ref 0.0–1.2)
CO2: 21 mmol/L (ref 20–29)
Calcium: 9.8 mg/dL (ref 8.7–10.2)
Chloride: 103 mmol/L (ref 96–106)
Creatinine, Ser: 0.95 mg/dL (ref 0.76–1.27)
Globulin, Total: 2.3 g/dL (ref 1.5–4.5)
Glucose: 88 mg/dL (ref 70–99)
Potassium: 4 mmol/L (ref 3.5–5.2)
Sodium: 144 mmol/L (ref 134–144)
Total Protein: 7.1 g/dL (ref 6.0–8.5)
eGFR: 110 mL/min/{1.73_m2} (ref >59)

## 2022-12-19 LAB — LIPID PANEL
Chol/HDL Ratio: 3.3 ratio (ref 0.0–5.0)
Cholesterol, Total: 158 mg/dL (ref 100–199)
HDL: 48 mg/dL (ref >39)
LDL Chol Calc (NIH): 99 mg/dL (ref 0–99)
Triglycerides: 53 mg/dL (ref 0–149)
VLDL Cholesterol Cal: 11 mg/dL (ref 5–40)

## 2022-12-19 LAB — TSH RFX ON ABNORMAL TO FREE T4: TSH: 1.3 u[IU]/mL (ref 0.450–4.500)

## 2022-12-19 MED ORDER — SUMATRIPTAN SUCCINATE 50 MG PO TABS: ORAL_TABLET | ORAL | 6 refills | Status: DC
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
## 2022-12-19 NOTE — Telephone Encounter (Signed)
Please see Dr. Annamaria Boots  recommendations  He has ov with [name] NP this week  Please contact office for sooner follow up if symptoms do not improve or worsen or seek emergency care

## 2022-12-19 NOTE — Telephone Encounter (Signed)
This patient has had very extensive work-up with CT and multiple  GI and ENT scopings. No anatomic explanation for symptoms identified. Don't know if anyone would consider V/Q scan or Bronchoscopy. Dominant issue is anxiety and I think we need to see if he has active [employer] provider. If so, they should be consulted. If no current psychiatry then needs referral.

## 2022-12-19 NOTE — Patient Instructions (Addendum)
Consider Ajovy or Emgality Will hold off on MRI brain   Orders Placed This Encounter  Procedures   Ambulatory referral to Neuropsychology   Ambulatory referral to Physical Therapy

## 2022-12-20 ENCOUNTER — Telehealth: Payer: Self-pay | Admitting: Pulmonary Disease

## 2022-12-20 ENCOUNTER — Telehealth: Payer: Self-pay | Admitting: Neurology

## 2022-12-20 ENCOUNTER — Telehealth: Payer: Self-pay

## 2022-12-20 NOTE — Telephone Encounter (Signed)
Patient would like to discuss getting D-dimer labwork done. He did research on this labwork and thinks it might be helpful to what is going on with help. Please advise if you would like patient to get this done and I will place the orders

## 2022-12-20 NOTE — Telephone Encounter (Signed)
PCC's can you please advise on time frame for HST's. Patient is curious I advised we have been using snap to help. But he was just curious if an in la study would be any faster

## 2022-12-20 NOTE — Telephone Encounter (Signed)
Patient would like to discuss D Dimer labwork. Patient phone number is [phone].

## 2022-12-20 NOTE — Telephone Encounter (Signed)
Referral for neuropsychology fax to [facility]. Phone:[phone], Fax: [phone]

## 2022-12-20 NOTE — Telephone Encounter (Signed)
He had a CT with contrast so unlikely that he has a blood clot; however, given his unexplained symptoms, we can go ahead and order the d dimer today. He can come in to have it drawn. Order has been placed. Thanks

## 2022-12-20 NOTE — Telephone Encounter (Signed)
Due to transportation patient is unsure if he could come today for lab work. I advised patient order has been placed and he can come in when its convenient for him. And will call with those results. Nothing further needed.

## 2022-12-20 NOTE — Telephone Encounter (Signed)
Called and spoke w/ pt in regards to his appt on 2/29. Per [facility] note he didn't need to return until after his sleep study was completed. He states that he is still not feeling well and asked to do a video visit instead. Switching visit type

## 2022-12-21 ENCOUNTER — Telehealth (INDEPENDENT_AMBULATORY_CARE_PROVIDER_SITE_OTHER): Payer: 59 | Admitting: Nurse Practitioner

## 2022-12-21 ENCOUNTER — Encounter: Payer: Self-pay | Admitting: Nurse Practitioner

## 2022-12-21 ENCOUNTER — Telehealth (HOSPITAL_BASED_OUTPATIENT_CLINIC_OR_DEPARTMENT_OTHER): Payer: Self-pay

## 2022-12-21 ENCOUNTER — Ambulatory Visit: Payer: Self-pay

## 2022-12-21 ENCOUNTER — Telehealth: Payer: Self-pay | Admitting: Family Medicine

## 2022-12-21 VITALS — Ht 68.0 in | Wt 121.0 lb

## 2022-12-21 DIAGNOSIS — R0602 Shortness of breath: Secondary | ICD-10-CM

## 2022-12-21 DIAGNOSIS — R6889 Other general symptoms and signs: Secondary | ICD-10-CM

## 2022-12-21 DIAGNOSIS — G4719 Other hypersomnia: Secondary | ICD-10-CM | POA: Diagnosis not present

## 2022-12-21 NOTE — Telephone Encounter (Signed)
     Chief Complaint: SOB since COVID Symptoms: Above Frequency: 2 months Pertinent Negatives: Patient denies Fever Disposition: [x]$ ED /[]$ Urgent Care (no appt availability in office) / []$ Appointment(In office/virtual)/ []$  [place] [facility]/ []$ Home Care/ []$ Refused Recommended Disposition /[]$  Mobile Bus/ [x]$  Follow-up with PCP Additional Notes: Go to ED for worsening of symptoms  Reason for Disposition  Patient sounds very sick or weak to the triager  Answer Assessment - Initial Assessment Questions 1. RESPIRATORY STATUS: "Describe your breathing?" (e.g., wheezing, shortness of breath, unable to speak, severe coughing)      SOB 2. ONSET: "When did this breathing problem begin?"      2 months ago 3. PATTERN "Does the difficult breathing come and go, or has it been constant since it started?"      Comes and goes 4. SEVERITY: "How bad is your breathing?" (e.g., mild, moderate, severe)    - MILD: No SOB at rest, mild SOB with walking, speaks normally in sentences, can lie down, no retractions, pulse < 100.    - MODERATE: SOB at rest, SOB with minimal exertion and prefers to sit, cannot lie down flat, speaks in phrases, mild retractions, audible wheezing, pulse 100-120.    - SEVERE: Very SOB at rest, speaks in single words, struggling to breathe, sitting hunched forward, retractions, pulse > 120      Mild 5. RECURRENT SYMPTOM: "Have you had difficulty breathing before?" If Yes, ask: "When was the last time?" and "What happened that time?"      Yes 6. CARDIAC HISTORY: "Do you have any history of heart disease?" (e.g., heart attack, angina, bypass surgery, angioplasty)      No 7. LUNG HISTORY: "Do you have any history of lung disease?"  (e.g., pulmonary embolus, asthma, emphysema)     No 8. CAUSE: "What do you think is causing the breathing problem?"      Had COVID 9. OTHER SYMPTOMS: "Do you have any other symptoms? (e.g., dizziness, runny nose, cough, chest pain, fever)      No 10. O2 SATURATION MONITOR:  "Do you use an oxygen saturation monitor (pulse oximeter) at home?" If Yes, ask: "What is your reading (oxygen level) today?" "What is your usual oxygen saturation reading?" (e.g., 95%)       No 11. PREGNANCY: "Is there any chance you are pregnant?" "When was your last menstrual period?"       N/a 12. TRAVEL: "Have you traveled out of the country in the last month?" (e.g., travel history, exposures)       No  Protocols used: Breathing Difficulty-A-AH
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
## 2022-12-21 NOTE — Telephone Encounter (Signed)
Pt was called in regards to his mychart message sent through pt advice. Pt called and stated he has been having shortness of breath. Spoke with provider Pecolia Ades, NP she advised pt to go to the ed. Pt stated he didn't want to go to the ed because the wait. Provider stated it can be very serious when it come to chest pain and shortness of breath.   [name], RMA

## 2022-12-21 NOTE — Telephone Encounter (Signed)
Patient was triaged by mistake. Due to pt not established as of now, informed triage nurse to tell pt to go to ED for now. Nurse verbalized understanding.

## 2022-12-22 ENCOUNTER — Ambulatory Visit: Payer: Self-pay

## 2022-12-22 DIAGNOSIS — Z419 Encounter for procedure for purposes other than remedying health state, unspecified: Secondary | ICD-10-CM | POA: Diagnosis not present

## 2022-12-22 DIAGNOSIS — R0602 Shortness of breath: Secondary | ICD-10-CM | POA: Diagnosis not present

## 2022-12-22 DIAGNOSIS — F419 Anxiety disorder, unspecified: Secondary | ICD-10-CM | POA: Diagnosis not present

## 2022-12-22 DIAGNOSIS — Z7689 Persons encountering health services in other specified circumstances: Secondary | ICD-10-CM | POA: Diagnosis not present

## 2022-12-22 NOTE — Telephone Encounter (Signed)
Not a current patient. Advised to go to ED  Patient Name: Nicholas Johnson Gender: Male DOB: 1991-10-07 Age: 32 Y 75 M 28 D Return Phone Number: [phone] (Primary) Address: City/ State/ Zip: [place]  [address] Client [place] [facility] [place] Client Site [place] [facility] [place] [facility] Contact Type Call Who Is Calling Patient / Member / Family / Caregiver Call Type Triage / Clinical Relationship To Patient Self Return Phone Number [phone] (Primary) Chief Complaint BREATHING - shortness of breath or sounds breathless Reason for Call Symptomatic / Request for [place] with the office Transferring caller - No appt today *new patient* Patient having shortness of breath and not sure what's causing it. Caller states he previously had covid a few months ago and has been having breathing issues since. Additional Comment Izora Gala in the office states he's not had his new patient visit yet but she still allowed the transfer. Translation No Nurse Assessment Nurse: Altamease Oiler, RN, Adriana Date/Time (Eastern Time): 12/21/2022 9:39:12 AM Confirm and document reason for call. If symptomatic, describe symptoms. ---pt states that he is experiencing sob and trouble breathing while talking. has been ongoing for 2 months. feels like "it's tight around my neck" near jawline. also feels congested. states he had these sx prior but after covid 2 months ago it is worse. Does the patient have any new or worsening symptoms? ---Yes Will a triage be completed? ---Yes Related visit to physician within the last 2 weeks? ---No Does the PT have any chronic conditions? (i.e. diabetes, asthma, this includes High risk factors for pregnancy, etc.) ---No Is this a behavioral health or substance abuse call? ---No Disp. Time Eilene Ghazi Time) Disposition Final User 12/21/2022 9:37:45 AM Send to Urgent Daron Offer, Lanette 12/21/2022  9:59:10 AM Clinical Call Yes Altamease Oiler RN, Adriana Final Disposition 12/21/2022 9:59:10 AM Clinical Call Yes Altamease Oiler, RN, Adriana Comments User: Kizzie Fantasia, RN Date/Time Eilene Ghazi Time): 12/21/2022 9:54:47 AM spoke [place] states that we should advise that pt be seen in ED as pt is not an established pt and call should have not been created. charge nurse notified. User: Kizzie Fantasia, RN Date/Time Eilene Ghazi Time): 12/21/2022 9:59:00 AM informed pt and states he was "probably gonna do that anyway" (go to ED) but wanted to see if they had any sooner appts than 3/12 to establish care. advised to go to ED and call office about a sooner appt. verbalizes understanding per charge, close as clinical call

## 2022-12-22 NOTE — Telephone Encounter (Addendum)
Pt did not call back   Pt terminated conversation when his PCP was calling. Pt will call back after he speaks with PCP.  Answer Assessment - Initial Assessment Questions 1. RESPIRATORY STATUS: "Describe your breathing?" (e.g., wheezing, shortness of breath, unable to speak, severe coughing)      Shortness of breath 2. ONSET: "When did this breathing problem begin?"      2 months 3. PATTERN "Does the difficult breathing come and go, or has it been constant since it started?"      Most of the time 4. SEVERITY: "How bad is your breathing?" (e.g., mild, moderate, severe)    - MILD: No SOB at rest, mild SOB with walking, speaks normally in sentences, can lie down, no retractions, pulse < 100.    - MODERATE: SOB at rest, SOB with minimal exertion and prefers to sit, cannot lie down flat, speaks in phrases, mild retractions, audible wheezing, pulse 100-120.    - SEVERE: Very SOB at rest, speaks in single words, struggling to breathe, sitting hunched forward, retractions, pulse > 120      Pt states that SOB is mild/ severe 5. RECURRENT SYMPTOM: "Have you had difficulty breathing before?" If Yes, ask: "When was the last time?" and "What happened that time?"      Ongoing since COVID 6. CARDIAC HISTORY: "Do you have any history of heart disease?" (e.g., heart attack, angina, bypass surgery, angioplasty)      Normal Echo 7. LUNG HISTORY: "Do you have any history of lung disease?"  (e.g., pulmonary embolus, asthma, emphysema)     Did a pulmonary study 8. CAUSE: "What do you think is causing the breathing problem?"      COVID 9. OTHER SYMPTOMS: "Do you have any other symptoms? (e.g., dizziness, runny nose, cough, chest pain, fever)     Sore throat, not breathing well through his nose 10. O2 SATURATION MONITOR:  "Do you use an oxygen saturation monitor (pulse oximeter) at home?" If Yes, ask: "What is your reading (oxygen level) today?" "What is your usual oxygen saturation reading?" (e.g., 95%)        99% pulse 68  Protocols used: Breathing Difficulty-A-AH

## 2022-12-23 ENCOUNTER — Telehealth: Payer: Self-pay | Admitting: Pulmonary Disease

## 2022-12-23 DIAGNOSIS — F4323 Adjustment disorder with mixed anxiety and depressed mood: Secondary | ICD-10-CM | POA: Diagnosis not present

## 2022-12-24 ENCOUNTER — Emergency Department (HOSPITAL_COMMUNITY)
Admission: EM | Admit: 2022-12-24 | Discharge: 2022-12-24 | Disposition: A | Discharge status: Home / Self Care | Payer: 59 | Attending: Emergency Medicine | Admitting: Emergency Medicine

## 2022-12-24 ENCOUNTER — Other Ambulatory Visit: Payer: Self-pay

## 2022-12-24 ENCOUNTER — Encounter (HOSPITAL_COMMUNITY): Payer: Self-pay

## 2022-12-24 ENCOUNTER — Emergency Department (HOSPITAL_COMMUNITY): Payer: 59

## 2022-12-24 DIAGNOSIS — R12 Heartburn: Secondary | ICD-10-CM | POA: Insufficient documentation

## 2022-12-24 DIAGNOSIS — R0789 Other chest pain: Secondary | ICD-10-CM | POA: Diagnosis not present

## 2022-12-24 DIAGNOSIS — R499 Unspecified voice and resonance disorder: Secondary | ICD-10-CM | POA: Insufficient documentation

## 2022-12-24 DIAGNOSIS — R0602 Shortness of breath: Secondary | ICD-10-CM | POA: Insufficient documentation

## 2022-12-24 DIAGNOSIS — Z91010 Allergy to peanuts: Secondary | ICD-10-CM | POA: Insufficient documentation

## 2022-12-24 DIAGNOSIS — R069 Unspecified abnormalities of breathing: Secondary | ICD-10-CM | POA: Diagnosis not present

## 2022-12-24 DIAGNOSIS — R070 Pain in throat: Secondary | ICD-10-CM | POA: Insufficient documentation

## 2022-12-24 DIAGNOSIS — R0981 Nasal congestion: Secondary | ICD-10-CM | POA: Insufficient documentation

## 2022-12-24 DIAGNOSIS — Z743 Need for continuous supervision: Secondary | ICD-10-CM | POA: Diagnosis not present

## 2022-12-24 DIAGNOSIS — R457 State of emotional shock and stress, unspecified: Secondary | ICD-10-CM | POA: Diagnosis not present

## 2022-12-24 MED ORDER — ALBUTEROL SULFATE HFA 108 (90 BASE) MCG/ACT IN AERS: 2.0000 | INHALATION_SPRAY | RESPIRATORY_TRACT | Status: DC | PRN

## 2022-12-24 MED ORDER — ALUM & MAG HYDROXIDE-SIMETH 200-200-20 MG/5ML PO SUSP
15.0000 mL | Freq: Once | ORAL | Status: AC
  Administered 2022-12-24: 15 mL via ORAL
  Filled 2022-12-24: qty 30
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
## 2022-12-24 NOTE — ED Notes (Signed)
Pt refusing to leave.

## 2022-12-24 NOTE — Discharge Instructions (Addendum)
Try Maalox. Follow up with your care teams as planned.

## 2022-12-24 NOTE — ED Triage Notes (Signed)
Pt reports long history of GERD. Pt reports that acid has gotten worse and it is making it difficult to breathe.

## 2022-12-25 ENCOUNTER — Telehealth: Payer: Self-pay

## 2022-12-25 ENCOUNTER — Telehealth (HOSPITAL_BASED_OUTPATIENT_CLINIC_OR_DEPARTMENT_OTHER): Payer: Self-pay

## 2022-12-25 ENCOUNTER — Telehealth: Payer: Self-pay | Admitting: Nurse Practitioner

## 2022-12-25 ENCOUNTER — Encounter: Payer: Self-pay | Admitting: Nurse Practitioner

## 2022-12-25 ENCOUNTER — Telehealth: Payer: Self-pay | Admitting: Family Medicine

## 2022-12-25 DIAGNOSIS — F451 Undifferentiated somatoform disorder: Secondary | ICD-10-CM | POA: Insufficient documentation

## 2022-12-25 DIAGNOSIS — R6889 Other general symptoms and signs: Secondary | ICD-10-CM | POA: Insufficient documentation

## 2022-12-25 NOTE — Telephone Encounter (Signed)
Patient called answering service 12/23/2022 and 12/24/2022 states, "having trouble breathing. Is concerned & would like a call back from on call doc" & "upper respiratory issue-all stuffed up feels like throat is swollen and feels like air is not flowing, nail beds norma;" I do not see where patient was called, but he did go to [facility] yesterday.   Please advise.

## 2022-12-25 NOTE — Assessment & Plan Note (Addendum)
Extensive workup with numerous specialties including pulmonology, ENT, laryngeal specialist, GI, cardiology, neurology. Unrevealing thus far. He has had clear CT of the chest and normal lung function. Unable to complete post bronchodilator and DLCO due to reported chest discomfort. We trialed him on triple therapy regimen without any perceived benefit. He has also not received benefit from SABA use. Despite having clear CT chest with contrast without evidence of PE, he has requested a d dimer. Given we still have no explanation for his symptoms, it is reasonable to obtain this; although, I have very low suspicion for PE given timeframe. We are also still waiting on HST. Lower suspicion this is contributing to his symptoms given sudden onset. I am not sure we will find a physical explanation for his symptoms at this point. Encouraged him to follow up with speech therapy. Provided with ED precautions.  He is very frustrated and became agitated during our visit due to lack of answers. I have offered him a referral for a second opinion. Initially he requested this but then he declined and stated he would prefer to wait to see what the next phase of testing holds. He does agree that he does not think his lungs are his problem and believes it is coming from his throat. He does not think anxiety is playing a role here. I did advise that mental health disorders can produce physical symptoms, similar to what he is experiencing. I also strongly advised he see psychiatry/counselor to assess for/treat underlying mental health disorders. He told me that he has a therapist he is working with.   Patient Instructions  Your lung testing and all of your imaging have showed normal lung function/clear lungs, which is good news. You have always had normal oxygen levels as well. No evidence of asthma or other lung disease at this point.   Given your unexplained symptoms, we can obtain d dimer; although, I have very low suspicion  for a blood clot in your lung given the timeframe of your symptoms. If positive, this does not guarantee that you have a blood clot and we would need to have another scan completed. Please come in to have this drawn.    Given your symptoms of snoring and daytime sleepiness, you could have sleep disordered breathing with sleep apnea so we have ordered a home sleep study. Someone will contact you regarding scheduling this.    We discussed how untreated sleep apnea puts an individual at risk for cardiac arrhthymias, pulm HTN, DM, stroke and increases their risk for daytime accidents. We also briefly reviewed treatment options including weight loss, side sleeping position, oral appliance, CPAP therapy or referral to ENT for possible surgical options if you were to have sleep apnea   Follow up with speech therapy and ENT as scheduled   Follow up after sleep study with Dr. Annamaria Boots. If symptoms do not improve or worsen, please contact office for sooner follow up or seek emergency care.

## 2022-12-25 NOTE — Telephone Encounter (Signed)
Pt was advised to go to ED  Patient Name: Nicholas Johnson Gender: Male DOB: 19-Feb-1991 Age: 32 Y 81 M 2 D Return Phone Number: [phone] (Primary) Address: City/ State/ Zip: [place]  [address] Client [facility] Client [facility] Night Provider Berniece Pap- MD Contact Type Call Who Is Calling Patient / Member / Family / Caregiver Call Type Triage / Clinical Relationship To Patient Self Return Phone Number [phone] (Primary) Chief Complaint BREATHING - shortness of breath or sounds breathless Reason for Call Symptomatic / Request for [place] states he has sob. Translation No Nurse Assessment Nurse: Wiser, Therapist, sports, Heidi Date/Time (Eastern Time): 12/23/2022 11:07:16 PM Confirm and document reason for call. If symptomatic, describe symptoms. ---Caller states it feels like his tonsils are swollen. C/o SOB. Caller is able to swallow Does the patient have any new or worsening symptoms? ---Yes Will a triage be completed? ---Yes Related visit to physician within the last 2 weeks? ---Yes Does the PT have any chronic conditions? (i.e. diabetes, asthma, this includes High risk factors for pregnancy, etc.) ---No Is this a behavioral health or substance abuse call? ---No Guidelines Guideline Title Affirmed Question Affirmed Notes Nurse Date/Time (Eastern Time) Breathing Difficulty [1] MODERATE difficulty breathing (e.g., speaks in phrases, SOB even at rest, pulse 100-120) AND [2] NEW-onset or WORSE than normal Wiser, RN, Heidi 12/23/2022 11:11:01 PM Disp. Time Eilene Ghazi Time) Disposition Final User 12/23/2022 11:04:42 PM Send to Urgent Payton Mccallum 12/23/2022 11:15:30 PM Go to ED Now Yes Wiser, RN, Heidi Final Disposition 12/23/2022 11:15:30 PM Go to ED Now Yes Wiser, RN, Heidi Caller Disagree/Comply Comply Caller Understands Yes PreDisposition  InappropriateToAsk Care Advice Given Per Guideline GO TO ED NOW: * You need to be seen in the Emergency Department. * Another adult should drive. * Patient should not delay going to the emergency department. BRING MEDICINES: * Bring a list of your current medicines when you go to the Emergency Department (ER). CARE ADVICE given per Breathing Difficulty (Adult) guideline. Referrals GO TO FACILITY UNDECIDED

## 2022-12-25 NOTE — Assessment & Plan Note (Signed)
>>  ASSESSMENT AND PLAN FOR SOMATIC COMPLAINTS, MULTIPLE WRITTEN ON 12/25/2022 10:07 AM BY [name], KATHERINE V, NP  Multitude of complaints/concerns. No physical cause identified thus far. See above.

## 2022-12-25 NOTE — Telephone Encounter (Signed)
Patient called to try to make a new patient appointment with our office. He states he has been seeing [facility] but he states he had some disagreements with them and is now released from there office. He wants to make a appointment with our office. Informed patient that our office per providers protocol at this time are not seeing patients if they have seen another GI provider in the past 3 years. Patient states that how can we turn away patients. Informed patient that was the providers in this officer protocol at this time. He asked to talk to a office manager informed him that our office manager was on vacation and then  Patient disconnected the phone.

## 2022-12-25 NOTE — Telephone Encounter (Signed)
Pt has an upcoming appointment at [facility] for 3/12. The same day he has an upcoming appointment for [place] for a physical please call pt and verify that he will no longer be our pt.

## 2022-12-25 NOTE — Assessment & Plan Note (Addendum)
Excessive daytime sleepiness, restless sleep, snoring. Awaiting HST. Cautioned on safe driving practices.

## 2022-12-25 NOTE — Assessment & Plan Note (Signed)
Multitude of complaints/concerns. No physical cause identified thus far. See above.

## 2022-12-25 NOTE — Patient Instructions (Addendum)
Your lung testing and all of your imaging have showed normal lung function/clear lungs, which is good news. You have always had normal oxygen levels as well. No evidence of asthma or other lung disease at this point.   Given your unexplained symptoms, we can obtain d dimer; although, I have very low suspicion for a blood clot in your lung given the timeframe of your symptoms. If positive, this does not guarantee that you have a blood clot and we would need to have another scan completed. Please come in to have this drawn.    Given your symptoms of snoring and daytime sleepiness, you could have sleep disordered breathing with sleep apnea so we have ordered a home sleep study. Someone will contact you regarding scheduling this.    We discussed how untreated sleep apnea puts an individual at risk for cardiac arrhthymias, pulm HTN, DM, stroke and increases their risk for daytime accidents. We also briefly reviewed treatment options including weight loss, side sleeping position, oral appliance, CPAP therapy or referral to ENT for possible surgical options if you were to have sleep apnea   Follow up with speech therapy and ENT as scheduled   Follow up after sleep study with Dr. Annamaria Boots. If symptoms do not improve or worsen, please contact office for sooner follow up or seek emergency care.

## 2022-12-25 NOTE — Telephone Encounter (Signed)
Patient Name: Nicholas Johnson ND Gender: Male DOB: 1991-01-14 Age: 32 Y 34 M 2 D Return Phone Number: [phone] (Primary) Address: City/ State/ Zip: [place]  [address] Client [facility] Client Site [facility] [place] Night Provider Berniece Pap- MD Contact Type Call Who Is Calling Patient / Member / Family / Caregiver Call Type Triage / Clinical Relationship To Patient Self Return Phone Number [phone] (Primary) Chief Complaint BREATHING - shortness of breath or sounds breathless Reason for Call Symptomatic / Request for [place] states that he is having shortness of breath and he has trouble swallowing. Has an appt scheduled with Dr. Randol Kern. New Pt Translation No No Triage Reason Other Nurse Assessment Nurse: Wynetta Emery, RN, Manuella Ghazi Date/Time (Eastern Time): 12/23/2022 10:42:53 PM Confirm and document reason for call. If symptomatic, describe symptoms. ---Caller states has trouble with his upper resp from collar bone to nose for over a year. States it got worse when he had covid 2 months. Tonight feels like air is not moving through his nose and upper resp system tonight. Talks in full sentences without diff. No noted resp distress obvious by phone. Caller placed me on hold to take another call, I disconnected after 5 min. Does the patient have any new or worsening symptoms? ---Yes Will a triage be completed? ---No [facility] reason for no triage. ---Other Please document clinical information provided and list any resource used. ---Caller placed nurse on hold for > 67mn. Call disconnected Disp. Time (Eilene GhaziTime) Disposition Final User 12/23/2022 10:41:17 PM Send to Urgent QRose Fillers3/11/2022 10:54:29 PM Clinical Call Yes LWynetta EmeryRN, Ouida Final Disposition 12/23/2022 10:54:29 PM Clinical Call Yes LWynetta Emery RN, OManuella Ghazi

## 2022-12-26 ENCOUNTER — Telehealth: Payer: Self-pay | Admitting: Pulmonary Disease

## 2022-12-26 NOTE — Telephone Encounter (Signed)
I made patient aware his order is being sent to [facility] and they will reach out to him about do the hst.  He would like to speak to a nurse - he states he has several questions about with the study is checking for.  Will route to triage so nurse can answer his questions.

## 2022-12-26 NOTE — Telephone Encounter (Signed)
I have called the pt, he states he wants to stay with Dr Burnard Bunting. I advised the patient he can't have 2 PCP, he advised he would call [facility] to cxl the New patient appt

## 2022-12-26 NOTE — Telephone Encounter (Signed)
Called and spoke w/ pt he asked if the sleep study will show any "soft tissue" issues in his throat. I let him know that the sleep study will show [place] if he is having any apneic events throughout the night and if his oxygen saturations drop. He states that a friend with "medical knowledge" told him it would show soft tissue abnormalities. I ended the phone call by reiterating what the study will show.   Instructed him to wait for [facility] to contact him about the study. Closing encounter.

## 2022-12-26 NOTE — Telephone Encounter (Signed)
32 year old male with extensive upper airway and GI workup called in tonight with "aspiration like symptoms ".  States that he has been having increasing globus sensation, difficulty laying flat, increased work of breathing and shortness of breath.  He feels this is similar to previous episodes and does not present any emergency symptoms at this time.  We discussed ER precautions, but he feels that he can wait until tomorrow's appointment to better assess this.  Does not deny any hemoptysis, cough, sputum production.  Denies any frank aspiration but feels that the Flonase and albuterol are not helping.  Denies any plaques on his tongue or recent steroid use.  Overall, no intervention is indicated at this time.  Can reassess in clinic tomorrow.  Levie Heritage, MD

## 2022-12-27 ENCOUNTER — Ambulatory Visit (INDEPENDENT_AMBULATORY_CARE_PROVIDER_SITE_OTHER): Payer: 59 | Admitting: Internal Medicine

## 2022-12-27 ENCOUNTER — Telehealth: Payer: Self-pay | Admitting: Internal Medicine

## 2022-12-27 ENCOUNTER — Encounter: Payer: Self-pay | Admitting: Internal Medicine

## 2022-12-27 ENCOUNTER — Telehealth: Payer: Self-pay | Admitting: Pulmonary Disease

## 2022-12-27 ENCOUNTER — Telehealth: Payer: Self-pay | Admitting: Neurology

## 2022-12-27 ENCOUNTER — Encounter (HOSPITAL_BASED_OUTPATIENT_CLINIC_OR_DEPARTMENT_OTHER): Payer: 59 | Admitting: Family Medicine

## 2022-12-27 VITALS — BP 122/78 | HR 81 | Temp 98.0°F | Ht 68.0 in | Wt 113.6 lb

## 2022-12-27 DIAGNOSIS — J310 Chronic rhinitis: Secondary | ICD-10-CM | POA: Insufficient documentation

## 2022-12-27 DIAGNOSIS — R0609 Other forms of dyspnea: Secondary | ICD-10-CM | POA: Diagnosis not present

## 2022-12-27 DIAGNOSIS — Z7689 Persons encountering health services in other specified circumstances: Secondary | ICD-10-CM | POA: Diagnosis not present

## 2022-12-27 NOTE — Patient Instructions (Addendum)
I emphasized that nasal steroids (like flonase) have no immediate benefit in terms of improving symptoms.  To help them reached the target tissue, the patient should use Afrin two puffs every 12 hours applied one min before using the nasal steroids.  Afrin should be stopped after no more than 5 days.  If the symptoms worsen, Afrin can be restarted after 5 days off of therapy to prevent rebound congestion from overuse of Afrin.  I also emphasized that in no way are nasal steroids a concern in terms of "addiction".   Only use your albuterol as a rescue medication to be used if you can't catch your breath by resting or doing a relaxed purse lip breathing pattern.  - The less you use it, the better it will work when you need it. - Ok to use up to 2 puffs  every 4 hours if you must but call for immediate appointment if use goes up over your usual need - Don't leave home without it !!  (think of it like the starter fluid/ spare tire for your car)    Also  Ok to try albuterol 15 min before an activity (on alternating days)  that you know would usually make you short of breath and see if it makes any difference and if makes none then don't take albuterol after activity unless you can't catch your breath as this means it's the resting that helps, not the albuterol.      Pulmonary follow up is as needed

## 2022-12-27 NOTE — Assessment & Plan Note (Addendum)
Onset around age 32 assoc with globus with extensive neg w/u including Dr Bettina Gavia - 12/27/2022   Walked on RA  x  3  lap(s) =  approx 750  ft  @ mod pace, stopped due to end of study s sob  with lowest 02 sats 98%    Sob at rest not reproduced with walking assoc with sensation of severe globus  is not c/w a Cardiopulmonary limitation (or for that matter problem) and no further evaluation is indicated nor any additional trial of pulmonary medications. In fact, the fewer specialists involved the better as alluded to in Dr Bettina Gavia recent thoughtful note  Therefore no f/u appt needed in this clinic

## 2022-12-27 NOTE — Telephone Encounter (Signed)
Will close encounter since patient has a apt with Dr. Melvyn Novas today

## 2022-12-27 NOTE — Telephone Encounter (Signed)
Patient was just seen last week, somehow still on schedule for appointment tomorrow. Please cancel this appointment, just needs 6 month follow up. Thanks

## 2022-12-27 NOTE — Assessment & Plan Note (Signed)
Assoc with nasal obst symptoms and pnds with cough   I emphasized that nasal steroids have no immediate benefit in terms of improving symptoms.  To help them reached the target tissue, the patient should use Afrin two puffs every 12 hours applied one min before using the nasal steroids.  Afrin should be stopped after no more than 5 days.  If the symptoms worsen, Afrin can be restarted after 5 days off of therapy to prevent rebound congestion from overuse of Afrin.  I also emphasized that in no way are nasal steroids a concern in terms of "addiction".   F/u ent for this and his globus sensation.          Each maintenance medication was reviewed in detail including emphasizing most importantly the difference between maintenance and prns and under what circumstances the prns are to be triggered using an action plan format where appropriate.  Total time for H and P, chart review, counseling,  directly observing portions of ambulatory 02 saturation study/ and generating customized AVS unique to this office visit / same day charting > 40 min for multiple  refractory upper airway   symptoms of uncertain etiology

## 2022-12-27 NOTE — Telephone Encounter (Signed)
Patient has an appt with MW today at 1130am. Will close encounter.

## 2022-12-27 NOTE — Progress Notes (Signed)
Nicholas Johnson., male    DOB: 08-06-1991   MRN: [id number]   Brief patient profile:  32 yobm never smoker self referred to pulmonary clinic 12/27/2022 for globus with neg ENT w/u with Dr Bettina Gavia last eval 31/21/24: no more testing is needed   Ran track in HS  distance/ sprint and cornerback and did fine 33 age  noted throat fullness aggravated by tuba with abrupt discomfort after hyperextending his neck and then also problems with breathing thru nose > allergy to pollen > rec flonase and possible nasal surgery Rosalene Billings Es 11/08/22 Normal esophagram.    History of Present Illness  12/27/2022  Pulmonary/ 1st office eval/Nayanna Seaborn  Chief Complaint  Patient presents with   Acute Visit    Increased SOB and has the sensation of throat tightness. He states it's "uncomfortable and achy" to swallow. He gets winded when he walks and talks at the same time. He says he was seen by ENT already.   Dyspnea:  at rest and limited by R leg as well as breathing - never specified a distance he could walk before sob "depends on how fast I go"  No better on inhalers including most recently trelegy  Cough: assoc with pnds  Sleep: flat bed / 2 pillows  irreg hours  SABA use: using every 6 hours, has not used on day of ov    No obvious day to day or daytime pattern/variability or assoc excess/ purulent sputum or mucus plugs or hemoptysis or cp   subjective wheeze or overt sinus or hb symptoms.     Also denies any obvious fluctuation of symptoms with weather or environmental changes or other aggravating or alleviating factors except as outlined above   No unusual exposure hx or h/o childhood pna/ asthma or knowledge of premature birth.  Current Allergies, Complete Past Medical History, Past Surgical History, Family History, and Social History were reviewed in [employer] record.  ROS  The following are not active complaints unless bolded Hoarseness, sore throat, dysphagia= globus, dental  problems, itching, sneezing,  nasal congestion or discharge of excess mucus or purulent secretions, ear ache,   fever, chills, sweats, unintended wt loss or wt gain, classically pleuritic or exertional cp,  orthopnea pnd or arm/hand swelling  or leg swelling, presyncope, palpitations, abdominal pain, anorexia, nausea, vomiting, diarrhea  or change in bowel habits or change in bladder habits, change in stools or change in urine, dysuria, hematuria,  rash, arthralgias, visual complaints, headache, numbness, weakness or ataxia or problems with walking or coordination,  change in mood or  memory.           Past Medical History:  Diagnosis Date   Nasal congestion    Parotid gland pain    Psychosis ([place])    admitted 12/15    Outpatient Medications Prior to Visit -  - NOTE:   Unable to verify as accurately reflecting what pt takes    Medication Sig Dispense Refill   albuterol (VENTOLIN HFA) 108 (90 Base) MCG/ACT inhaler Inhale 2 puffs into the lungs every 6 (six) hours as needed for wheezing or shortness of breath. (Patient not taking: Reported on 12/27/2022) 8 g 2   diphenhydrAMINE (BENADRYL) 25 mg capsule Take 25 mg by mouth every 6 (six) hours as needed. (Patient not taking: Reported on 12/27/2022)     famotidine (PEPCID) 20 MG tablet Take 20 mg by mouth 2 (two) times daily. (Patient not taking: Reported on 12/27/2022)  fexofenadine (ALLEGRA) 60 MG tablet Take 60 mg by mouth 2 (two) times daily. (Patient not taking: Reported on 12/27/2022)     Fluticasone-Umeclidin-Vilant (TRELEGY ELLIPTA) 100-62.5-25 MCG/ACT AEPB Inhale 1 puff into the lungs daily. (Patient not taking: Reported on 12/27/2022) 14 each 0   hyoscyamine (LEVSIN SL) 0.125 MG SL tablet 1 tablet by mouth 30 minutes before meals 3 times a day (Patient not taking: Reported on 12/27/2022) 90 tablet 0   ibuprofen (ADVIL) 800 MG tablet Take 1 tablet (800 mg total) by mouth 3 (three) times daily. (Patient not taking: Reported on 12/27/2022) 21 tablet 0    magic mouthwash w/lidocaine SOLN Take 5 mLs by mouth 4 (four) times daily. (Patient not taking: Reported on 12/27/2022) 240 mL 0   sucralfate (CARAFATE) 1 g tablet dissolve in 10cc of water and take as a slurry between meals and bedtime (Patient not taking: Reported on 12/27/2022) 120 tablet 1   omeprazole (PRILOSEC) 20 MG capsule Take 1 capsule (20 mg total) by mouth 2 (two) times daily before a meal for 14 days. (Patient taking differently: Take 80 mg by mouth 2 (two) times daily before a meal.) 28 capsule 0   SUMAtriptan (IMITREX) 50 MG tablet May repeat in 2 hours if headache persists or recurs. 10 tablet 6   No facility-administered medications prior to visit.     Objective:     BP 122/78 (BP Location: Left Arm, Cuff Size: Normal)   Pulse 81   Temp 98 F (36.7 C) (Oral)   Ht '5\' 8"'$  (1.727 m)   Wt 113 lb 9.6 oz (51.5 kg)   SpO2 99% Comment: on RA  BMI 17.27 kg/m   SpO2: 99 % (on RA)  Amb bm nad    HEENT : Oropharynx  clear      Nasal turbinates nl / ear canals normal    NECK :  without  apparent JVD/ palpable Nodes/TM    LUNGS: no acc muscle use,  Nl contour chest which is clear to A and P bilaterally without cough on insp or exp maneuvers   CV:  RRR  no s3 or murmur or increase in P2, and no edema   ABD:  soft and nontender with nl inspiratory excursion in the supine position. No bruits or organomegaly appreciated   MS:  Nl gait/ ext warm without deformities Or obvious joint restrictions  calf tenderness, cyanosis or clubbing    SKIN: warm and dry without lesions    NEURO:  alert, approp, nl sensorium with  no motor or cerebellar deficits apparent.    I personally reviewed images and agree with radiology impression as follows:  CXR:   portable 12/24/22  No active dz     Assessment   DOE (dyspnea on exertion) Onset around age 32 assoc with globus with extensive neg w/u including Dr Bettina Gavia - 12/27/2022   Walked on RA  x  3  lap(s) =  31pprox 750  ft  @ mod pace,  stopped due to end of study s sob  with lowest 02 sats 98%    Sob at rest not reproduced with walking assoc with sensation of severe globus  is not c/w a Cardiopulmonary limitation (or for that matter problem) and no further evaluation is indicated nor any additional trial of pulmonary medications. In fact, the fewer specialists involved the better as alluded to in Dr Bettina Gavia recent thoughtful note  Therefore no f/u appt needed in this clinic   Rhinitis, chronic Assoc  with nasal obst symptoms and pnds with cough   I emphasized that nasal steroids have no immediate benefit in terms of improving symptoms.  To help them reached the target tissue, the patient should use Afrin two puffs every 12 hours applied one min before using the nasal steroids.  Afrin should be stopped after no more than 5 days.  If the symptoms worsen, Afrin can be restarted after 5 days off of therapy to prevent rebound congestion from overuse of Afrin.  I also emphasized that in no way are nasal steroids a concern in terms of "addiction".   F/u ent for this and his globus sensation.          Each maintenance medication was reviewed in detail including emphasizing most importantly the difference between maintenance and prns and under what circumstances the prns are to be triggered using an action plan format where appropriate.  Total time for H and P, chart review, counseling,  directly observing portions of ambulatory 02 saturation study/ and generating customized AVS unique to this office visit / same day charting > 40 min for multiple  refractory upper airway   symptoms of uncertain etiology                         Christinia Gully, MD 12/27/2022

## 2022-12-27 NOTE — Telephone Encounter (Signed)
Patient advises he has been aspirating when eating, advises he hasn't hardly ate in the past couple of days, advised patient to call GI he advises he is currently working with him. He has a appointment with Dr. Melvyn Novas at 11:30 am advised if aspiration was worse before apt he could go to ER/urgent care. Patient advises he hasn't received proper care in ER and doesn't want to continue to go there. Will close encounter for now since patient has apt soon

## 2022-12-28 ENCOUNTER — Telehealth: Payer: Self-pay | Admitting: Neurology

## 2022-12-28 ENCOUNTER — Telehealth: Payer: Self-pay | Admitting: Pulmonary Disease

## 2022-12-28 ENCOUNTER — Ambulatory Visit: Payer: 59 | Admitting: Neurology

## 2022-12-28 ENCOUNTER — Ambulatory Visit (HOSPITAL_BASED_OUTPATIENT_CLINIC_OR_DEPARTMENT_OTHER): Payer: 59 | Admitting: Family Medicine

## 2022-12-28 ENCOUNTER — Telehealth: Payer: Self-pay | Admitting: Internal Medicine

## 2022-12-28 ENCOUNTER — Encounter: Payer: Self-pay | Admitting: Neurology

## 2022-12-28 ENCOUNTER — Ambulatory Visit: Payer: 59 | Admitting: Internal Medicine

## 2022-12-28 ENCOUNTER — Telehealth (HOSPITAL_BASED_OUTPATIENT_CLINIC_OR_DEPARTMENT_OTHER): Payer: Self-pay | Admitting: Family Medicine

## 2022-12-28 DIAGNOSIS — G43709 Chronic migraine without aura, not intractable, without status migrainosus: Secondary | ICD-10-CM

## 2022-12-28 MED ORDER — TRELEGY ELLIPTA 100-62.5-25 MCG/ACT IN AEPB: 1.0000 | INHALATION_SPRAY | Freq: Every day | RESPIRATORY_TRACT | 0 refills | Status: DC

## 2022-12-28 NOTE — Telephone Encounter (Signed)
Spoke with the pt  He states that he is still having sensation of his throat tightening  He describes this as "feel like I am not getting enough air"  He states that he has been using albuterol and it's not helping  He is asking for further recs  I advised he should call his ENT also, since throat symptoms and seek emergent care if symptoms persist/worsen   Dr Melvyn Novas, please advise, thanks!

## 2022-12-28 NOTE — Telephone Encounter (Signed)
I called the patient, his main concern is his head pain, neck pain, aching in his eyes. He would like a referral to academic center, he is not sure we can help him here. I suggested [facility] or [facility], doesn't think that is going to be much help. Not interested in MRI brain right now due to swallowing issues impeding him from lying flat. I will place a referral to [facility].

## 2022-12-28 NOTE — Telephone Encounter (Signed)
Wanted to discuss about side effects of his inhalers and mechanism of action as a follow-up from his recent pulmonary appointment.  We discussed lung anatomy, inhaler use, and utility of maintenance medications (Trelegy) and when to use albuterol.  He was appreciative of the conversation and stated that he needed to follow-up with his ear nose and throat about his ongoing dysphonia.  He was appreciative of discussion of his CT scans and clarification on his previous workup.  No acute intervention was performed over the phone.  [facility].  Levie Heritage, MD

## 2022-12-28 NOTE — Telephone Encounter (Signed)
Pt called to advised that his driver left him, and asked if he could still come to his appt. I advised that we had a Full Schedule, so we would have to reschedule.  I suggest that we resch the CPE, to a OV-with issues, and he wants to speak to his nurse to explain .  Let Sherie & Dr Tennis Must [place] know

## 2022-12-28 NOTE — Addendum Note (Signed)
Addended by: Suzzanne Cloud on: 12/28/2022 04:30 PM   Modules accepted: Orders

## 2022-12-28 NOTE — Telephone Encounter (Signed)
Patient states has shortness of breath thru upper throat. Pharmacy is [facility]. Patient phone number is [phone].

## 2022-12-28 NOTE — Telephone Encounter (Signed)
Referral for neurology fax to [facility]. Phone: [phone], Fax: [phone].

## 2022-12-28 NOTE — Telephone Encounter (Signed)
Called and spoke to patient who stated he would send a mychart message with his concerns rather than coming in to the office. Patient was seen on 02.27.24

## 2022-12-28 NOTE — Telephone Encounter (Signed)
Spoke with the pt and notified of response per Dr Wert He verbalized understanding  Nothing further needed 

## 2022-12-29 ENCOUNTER — Ambulatory Visit: Payer: 59 | Admitting: Family

## 2022-12-29 DIAGNOSIS — R1312 Dysphagia, oropharyngeal phase: Secondary | ICD-10-CM | POA: Diagnosis not present

## 2022-12-29 DIAGNOSIS — R1319 Other dysphagia: Secondary | ICD-10-CM | POA: Diagnosis not present

## 2022-12-30 ENCOUNTER — Telehealth: Payer: Self-pay | Admitting: Pulmonary Disease

## 2022-12-30 DIAGNOSIS — F4323 Adjustment disorder with mixed anxiety and depressed mood: Secondary | ICD-10-CM | POA: Diagnosis not present

## 2022-12-30 NOTE — Telephone Encounter (Signed)
Patient c/o having a hard time breathing. He voices that his mouth and tongue feel swollen and his sinuses are blocked. His oxygen sat = 98% by oximetry and his denies wheezing. This seems to be more of a chronic rhinitis problem rather than a pulmonary problem. However, it is impossible to make a definitive diagnosis over the phone. Dr. 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 Dr. Melvyn Novas instructed the patient to use Afrin 2 puffs Q 12 hours for a maximum of 5 days prior to using his Flonase. The patient has not done that. I suggested that he should give the Afrin a try. Should his breathing become more labored, wheezing develop or oxygen sat drop below 92% he was instructed to go to the emergency department for further evaluation and management.

## 2023-01-01 NOTE — Telephone Encounter (Signed)
Patient called in for the third time today regarding this referral.  He states that he spoke with [facility] and they advised that they need someone from our office to call in regarding this referral. He mentioned that their fax lines may be down and they are requiring a verbal referral and then notes to be sent at a later time.

## 2023-01-01 NOTE — Telephone Encounter (Signed)
Spoke with pt who was somewhat hard to follow. Pt stated he is not currently using any inhalers or nose spray. Pt states he is working to get into see his ENT in [place] because he has not had success from the ENT office in [place]. Pt wanted me to ask if there could be any pulmonary issues that could cause problems with his "wind pipe" and RN re-educated pt that ENT would be the best doctors to evaluate the throat. Pt stated understanding and that he was looking forward to his OV in April with Dr. Melvyn Novas. Nothing further needed at this time.   Routing to Dr. Melvyn Novas as Juluis Rainier

## 2023-01-02 ENCOUNTER — Encounter (HOSPITAL_BASED_OUTPATIENT_CLINIC_OR_DEPARTMENT_OTHER): Payer: Self-pay | Admitting: Family Medicine

## 2023-01-02 ENCOUNTER — Ambulatory Visit (INDEPENDENT_AMBULATORY_CARE_PROVIDER_SITE_OTHER): Payer: 59 | Admitting: Family Medicine

## 2023-01-02 ENCOUNTER — Ambulatory Visit: Payer: 59 | Admitting: Internal Medicine

## 2023-01-02 DIAGNOSIS — Z600 Problems of adjustment to life-cycle transitions: Secondary | ICD-10-CM | POA: Diagnosis not present

## 2023-01-02 DIAGNOSIS — Z7689 Persons encountering health services in other specified circumstances: Secondary | ICD-10-CM | POA: Diagnosis not present

## 2023-01-02 DIAGNOSIS — R1310 Dysphagia, unspecified: Secondary | ICD-10-CM

## 2023-01-02 NOTE — Progress Notes (Signed)
    Procedures performed today:    None.  Independent interpretation of notes and tests performed by another provider:   None.  Brief History, Exam, Impression, and Recommendations:    BP 107/65 (BP Location: Left Arm, Patient Position: Sitting, Cuff Size: Normal)   Pulse 66   Ht  (1.727 m)   Wt 114 lb 9.6 oz (52 kg)   SpO2 100%   BMI 17.42 kg/m   Dysphagia Patient continues to complain of issues related to throat pain, swallowing difficulties, inability to eat due to these.  Evaluation thus far has included ENT, gastroenterology, pulmonology.  Thus far, testing has largely been within normal limits including normal esophagram, PFTs, echocardiogram, CT chest with contrast, thyroid ultrasound, CT soft tissue neck, barium swallow.  Despite this, patient continues to report notable issues with throat pain, swallowing difficulty, decreased p.o. intake.  He does have upcoming procedural visit with gastroenterology. On exam, patient is in no acute distress, vital signs stable.  Cardiovascular exam with regular rate and rhythm, lungs clear to auscultation bilaterally. We discussed considerations today, recommend that he continue with specialist evaluation including upcoming procedure with GI.  Additionally, we discussed the mental health) pertaining to ongoing symptoms, both the potential for underlying mood/psychiatric concerns contributing to symptoms as well as current symptoms present mood issues.  In discussing this, discussed potential treatment considerations including counseling/therapy as well as possibly meeting with a psychiatrist.  He would be interested in meeting with a psychiatrist, referral placed  Phase of life problem As above, we discussed considerations for potential underlying mood/mental health concerns both related to underlying issues and/or exacerbation of symptoms given concerns as outlined above.  As discussed above, we will proceed with referral to psychiatrist,  order placed  Return in about 6 months (around 07/05/2023) for follow-up.  Spent 35 minutes on this patient encounter, including preparation, chart review, face-to-face counseling with patient and coordination of care, and documentation of encounter   ___________________________________________ Lyrical Sowle de [place], MD, ABFM, [facility]

## 2023-01-02 NOTE — Telephone Encounter (Addendum)
Spoke with referral coordinator at [facility]; referral would be reviewed, entered in the system and pt will be called to schedule appt.  She can not see the referral until has been entered in the system and fax machine is not down.  Contacted pt to relay message above. Pt verbalized understand

## 2023-01-03 ENCOUNTER — Ambulatory Visit: Payer: 59 | Admitting: Neurology

## 2023-01-03 DIAGNOSIS — R09A2 Foreign body sensation, throat: Secondary | ICD-10-CM | POA: Diagnosis not present

## 2023-01-03 DIAGNOSIS — Z7689 Persons encountering health services in other specified circumstances: Secondary | ICD-10-CM | POA: Diagnosis not present

## 2023-01-03 DIAGNOSIS — R1312 Dysphagia, oropharyngeal phase: Secondary | ICD-10-CM | POA: Diagnosis not present

## 2023-01-03 DIAGNOSIS — R059 Cough, unspecified: Secondary | ICD-10-CM | POA: Diagnosis not present

## 2023-01-04 ENCOUNTER — Other Ambulatory Visit (HOSPITAL_BASED_OUTPATIENT_CLINIC_OR_DEPARTMENT_OTHER): Payer: Self-pay

## 2023-01-04 ENCOUNTER — Telehealth (HOSPITAL_BASED_OUTPATIENT_CLINIC_OR_DEPARTMENT_OTHER): Payer: Self-pay | Admitting: Family Medicine

## 2023-01-04 NOTE — Telephone Encounter (Signed)
Pt LVM that he would like to have a Referral to another ENT,

## 2023-01-06 DIAGNOSIS — F4323 Adjustment disorder with mixed anxiety and depressed mood: Secondary | ICD-10-CM | POA: Diagnosis not present

## 2023-01-08 ENCOUNTER — Encounter: Payer: Self-pay | Admitting: Internal Medicine

## 2023-01-08 ENCOUNTER — Ambulatory Visit (INDEPENDENT_AMBULATORY_CARE_PROVIDER_SITE_OTHER): Payer: 59 | Admitting: Internal Medicine

## 2023-01-08 VITALS — BP 118/60 | HR 69 | Temp 98.2°F | Ht 68.0 in | Wt 121.2 lb

## 2023-01-08 DIAGNOSIS — R058 Other specified cough: Secondary | ICD-10-CM

## 2023-01-08 DIAGNOSIS — R0609 Other forms of dyspnea: Secondary | ICD-10-CM

## 2023-01-08 DIAGNOSIS — Z7689 Persons encountering health services in other specified circumstances: Secondary | ICD-10-CM | POA: Diagnosis not present

## 2023-01-08 DIAGNOSIS — J310 Chronic rhinitis: Secondary | ICD-10-CM

## 2023-01-08 HISTORY — DX: Other specified cough: R05.8

## 2023-01-08 NOTE — Assessment & Plan Note (Signed)
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

## 2023-01-08 NOTE — Patient Instructions (Signed)
I emphasized that nasal steroids have no immediate benefit in terms of improving symptoms.  To help them reached the target tissue, the patient should use Afrin two puffs every 12 hours applied one min before using the nasal steroids.  Afrin should be stopped after no more than 5 days.  If the symptoms worsen, Afrin can be restarted after 5 days off of therapy to prevent rebound congestion from overuse of Afrin.  I also emphasized that in no way are nasal steroids a concern in terms of "addiction".   Finish your GI work and if your wish I can see if we can get you to Dr Theda Sers at [facility]   Only use your albuterol as a rescue medication to be used if you can't catch your breath by resting or doing a relaxed purse lip breathing pattern.  - The less you use it, the better it will work when you need it. - Ok to use up to 2 puffs  every 4 hours if you must but call for immediate appointment if use goes up over your usual need - Don't leave home without it !!  (think of it like the spare tire for your car)

## 2023-01-08 NOTE — Assessment & Plan Note (Signed)
Neg w/u by Dr Carol Ada completed in early 2024 -  01/08/2023 consider refer to [facility] for 2nd opinion / ? Botox injections for  form of VCD?   Explained need one medical center/ specialty at a time working on this problem but since not likely of a pulmonary origin rec next do ENT eval as above/ Dr Theda Sers at [facility] prn   Pulmonary f/u not needed          Each maintenance medication was reviewed in detail including emphasizing most importantly the difference between maintenance and prns and under what circumstances the prns are to be triggered using an action plan format where appropriate.  Total time for H and P, chart review, counseling, reviewing hfa device(s) and generating customized AVS unique to this office visit / same day charting = 34 min summary final f/u ov

## 2023-01-08 NOTE — Assessment & Plan Note (Signed)
Assoc with nasal obst symptoms and pnds with cough   Has not implemented rx for rhinitis and pt thinks his throat problem is due to "abnormal esophagram" though the one he did in our system is normal so rec  1) f/u GI as planned 2) call for referral to [facility] ENT Dr Theda Sers for  uacs (see below)

## 2023-01-08 NOTE — Progress Notes (Signed)
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    History of Present Illness  12/27/2022  Pulmonary/ 1st office eval/Nicholas Johnson  Chief Complaint  Patient presents with   Acute Visit    Increased SOB and has the sensation of throat tightness. He states it's "uncomfortable and achy" to swallow. He gets winded when he walks and talks at the same time. He says he was seen by ENT already.   Dyspnea:  at rest and limited by R leg as well as breathing - never specified a distance he could walk before sob "depends on how fast I go"  No better on inhalers including most recently trelegy  Cough: assoc with pnds  Sleep: flat bed / 2 pillows  irreg hours  SABA use: using every 6 hours, has not used on day of ov   Rec I emphasized that nasal steroids (like flonase) have no immediate benefit in terms of improving symptoms  Only use your albuterol as a rescue medication Also  Ok to try albuterol 15 min before an activity (on alternating days)  that you know would usually make you short of breath      Pulmonary follow up is as needed   Abn swallowing in [place]/ [facility]  DR Cindee Salt > es manometry planned   01/08/2023 ACUTE  ov/Nicholas Johnson re: upper airway cough syndrome and assoc nasal congestion / did not try afrin/flonase yet/ turned down by ent for further interventions   Chief Complaint  Patient presents with   Follow-up    Dyspnea x 3 days.  Sx worse after meals or drinking liquid.  Wants referral to  ENT  Dyspnea:  highly variable related to swallowing more than exertion  Cough: sensation of something stuck in throat while awake / made worse by eating or drinking but does not wake him  up  Sleeping: on end of couch getting about 30 degrees of elevation  SABA use: none - did not help  02: none    No obvious day to day or daytime variability or assoc excess/ purulent sputum or mucus plugs or hemoptysis or cp or chest tightness, subjective wheeze.  Sleeping  without nocturnal  or early am exacerbation  of respiratory  c/o's or need for noct saba. Also denies any obvious fluctuation of symptoms with weather or environmental changes or other aggravating or alleviating factors except as outlined above   No unusual exposure hx or h/o childhood pna/ asthma or knowledge of premature birth.  Current Allergies, Complete Past Medical History, Past Surgical History, Family History, and Social History were reviewed in [employer] record.  ROS  The following are not active complaints unless bolded Hoarseness, sore throat, dysphagia, dental problems, itching, sneezing,  nasal congestion or discharge of excess mucus or purulent secretions, ear ache,   fever, chills, sweats, unintended wt loss or wt gain, classically pleuritic or exertional cp,  orthopnea pnd or arm/hand swelling  or leg swelling, presyncope, palpitations, abdominal pain, anorexia, nausea, vomiting, diarrhea  or change in bowel habits or change in bladder habits, change in stools or change in urine, dysuria, hematuria,  rash, arthralgias, visual complaints, headache, numbness, weakness or ataxia or problems with walking or coordination,  change in mood or  memory.        Current Meds  Medication Sig   albuterol (VENTOLIN HFA) 108 (90 Base) MCG/ACT inhaler Inhale 2 puffs into the lungs every 6 (six) hours as needed for wheezing or shortness of breath.   diphenhydrAMINE (BENADRYL) 25 mg capsule Take 25 mg by mouth every  6 (six) hours as needed.   ibuprofen (ADVIL) 800 MG tablet Take 1 tablet (800 mg total) by mouth 3 (three) times daily.         Past Medical History:  Diagnosis Date   Nasal congestion    Parotid gland pain    Psychosis ([place])    admitted 12/15        Objective:     Wt Readings from Last 3 Encounters:  01/08/23 121 lb 3.2 oz (55 kg)  01/02/23 114 lb 9.6 oz (52 kg)  12/27/22 113 lb 9.6 oz (51.5 kg)      Vital signs reviewed  01/08/2023  - Note at rest 02 sats  100% on RA   General appearance:    amb bm nad    HEENT : Oropharynx  clear     Nasal turbinates mild non-specific swelling    NECK :  without  apparent JVD/ palpable Nodes/TM    LUNGS: no acc muscle use,  Nl contour chest which is clear to A and P bilaterally without cough on insp or exp maneuvers   CV:  RRR  no s3 or murmur or increase in P2, and no edema   ABD:  soft and nontender    MS:  Nl gait/ ext warm without deformities Or obvious joint restrictions  calf tenderness, cyanosis or clubbing    SKIN: warm and dry without lesions    NEURO:  alert, approp, nl sensorium with  no motor or cerebellar deficits apparent.          Assessment

## 2023-01-09 DIAGNOSIS — R1312 Dysphagia, oropharyngeal phase: Secondary | ICD-10-CM | POA: Diagnosis not present

## 2023-01-09 DIAGNOSIS — Z7689 Persons encountering health services in other specified circumstances: Secondary | ICD-10-CM | POA: Diagnosis not present

## 2023-01-10 DIAGNOSIS — K219 Gastro-esophageal reflux disease without esophagitis: Secondary | ICD-10-CM | POA: Diagnosis not present

## 2023-01-10 DIAGNOSIS — R1312 Dysphagia, oropharyngeal phase: Secondary | ICD-10-CM | POA: Diagnosis not present

## 2023-01-10 DIAGNOSIS — R1319 Other dysphagia: Secondary | ICD-10-CM | POA: Diagnosis not present

## 2023-01-10 DIAGNOSIS — Z7689 Persons encountering health services in other specified circumstances: Secondary | ICD-10-CM | POA: Diagnosis not present

## 2023-01-12 ENCOUNTER — Ambulatory Visit (INDEPENDENT_AMBULATORY_CARE_PROVIDER_SITE_OTHER): Payer: 59 | Admitting: Internal Medicine

## 2023-01-12 ENCOUNTER — Encounter: Payer: Self-pay | Admitting: Internal Medicine

## 2023-01-12 VITALS — BP 116/76 | HR 84 | Temp 98.4°F | Ht 68.0 in | Wt 115.0 lb

## 2023-01-12 DIAGNOSIS — J310 Chronic rhinitis: Secondary | ICD-10-CM | POA: Diagnosis not present

## 2023-01-12 DIAGNOSIS — M47812 Spondylosis without myelopathy or radiculopathy, cervical region: Secondary | ICD-10-CM

## 2023-01-12 DIAGNOSIS — E559 Vitamin D deficiency, unspecified: Secondary | ICD-10-CM

## 2023-01-12 DIAGNOSIS — G43709 Chronic migraine without aura, not intractable, without status migrainosus: Secondary | ICD-10-CM | POA: Diagnosis not present

## 2023-01-12 DIAGNOSIS — G4719 Other hypersomnia: Secondary | ICD-10-CM | POA: Diagnosis not present

## 2023-01-12 DIAGNOSIS — M5030 Other cervical disc degeneration, unspecified cervical region: Secondary | ICD-10-CM | POA: Diagnosis not present

## 2023-01-12 DIAGNOSIS — R29898 Other symptoms and signs involving the musculoskeletal system: Secondary | ICD-10-CM | POA: Diagnosis not present

## 2023-01-12 DIAGNOSIS — H5711 Ocular pain, right eye: Secondary | ICD-10-CM

## 2023-01-12 DIAGNOSIS — D72819 Decreased white blood cell count, unspecified: Secondary | ICD-10-CM

## 2023-01-12 DIAGNOSIS — R1310 Dysphagia, unspecified: Secondary | ICD-10-CM

## 2023-01-12 DIAGNOSIS — Z111 Encounter for screening for respiratory tuberculosis: Secondary | ICD-10-CM

## 2023-01-12 DIAGNOSIS — R5383 Other fatigue: Secondary | ICD-10-CM

## 2023-01-12 DIAGNOSIS — K219 Gastro-esophageal reflux disease without esophagitis: Secondary | ICD-10-CM

## 2023-01-12 DIAGNOSIS — S8992XS Unspecified injury of left lower leg, sequela: Secondary | ICD-10-CM | POA: Diagnosis not present

## 2023-01-12 DIAGNOSIS — H5713 Ocular pain, bilateral: Secondary | ICD-10-CM | POA: Insufficient documentation

## 2023-01-12 DIAGNOSIS — S199XXS Unspecified injury of neck, sequela: Secondary | ICD-10-CM

## 2023-01-12 DIAGNOSIS — E639 Nutritional deficiency, unspecified: Secondary | ICD-10-CM

## 2023-01-12 DIAGNOSIS — M25521 Pain in right elbow: Secondary | ICD-10-CM

## 2023-01-12 DIAGNOSIS — Z7689 Persons encountering health services in other specified circumstances: Secondary | ICD-10-CM | POA: Diagnosis not present

## 2023-01-12 DIAGNOSIS — S8992XA Unspecified injury of left lower leg, initial encounter: Secondary | ICD-10-CM

## 2023-01-12 DIAGNOSIS — M25522 Pain in left elbow: Secondary | ICD-10-CM

## 2023-01-12 DIAGNOSIS — R636 Underweight: Secondary | ICD-10-CM

## 2023-01-12 HISTORY — DX: Ocular pain, right eye: H57.11

## 2023-01-12 HISTORY — DX: Decreased white blood cell count, unspecified: D72.819

## 2023-01-12 MED ORDER — DULOXETINE HCL 30 MG PO CPEP: 30.0000 mg | ORAL_CAPSULE | Freq: Every day | ORAL | 3 refills | Status: DC

## 2023-01-12 NOTE — Assessment & Plan Note (Signed)
Encouraged patient to to get pressures in R eye checked with ophtho
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
## 2023-01-12 NOTE — Assessment & Plan Note (Signed)
Assess for nutritional deficiencies Advised patient to supplement vitamins and protein for now see AVS. Encouraged patient to return to office soon for f/u

## 2023-01-12 NOTE — Assessment & Plan Note (Signed)
Encouraged patient to do daily saline nasal misting spray (e.g. SimplySaline)

## 2023-01-12 NOTE — Assessment & Plan Note (Signed)
Encouraged patient to to sort out with pulmonary the possible fee he should not be charged due to [facility]

## 2023-01-12 NOTE — Patient Instructions (Addendum)
Please take vitamin D calcium supplement and a multivitamin with B12 and folate if you have trouble swallowing take it as gummy vitamins.  I believe that what is going on is your spinal injury is causing esophageal dysmotility causing all of the reflux symptoms and throat symptoms and that this is led to secondary vitamin deficiencies that are compounding the problem and making you feel worse.  The vitamins should help you to feel a lot better but they will not fix the swallowing problem we will have to fix the neck for that and I'm not sure it will improve with fixing or if the esophagus problem is from the neck.

## 2023-01-13 ENCOUNTER — Encounter: Payer: Self-pay | Admitting: Internal Medicine

## 2023-01-13 DIAGNOSIS — F4323 Adjustment disorder with mixed anxiety and depressed mood: Secondary | ICD-10-CM | POA: Diagnosis not present

## 2023-01-15 DIAGNOSIS — R1312 Dysphagia, oropharyngeal phase: Secondary | ICD-10-CM | POA: Diagnosis not present

## 2023-01-15 DIAGNOSIS — R4702 Dysphasia: Secondary | ICD-10-CM | POA: Diagnosis not present

## 2023-01-15 DIAGNOSIS — R1319 Other dysphagia: Secondary | ICD-10-CM | POA: Diagnosis not present

## 2023-01-15 DIAGNOSIS — Z7689 Persons encountering health services in other specified circumstances: Secondary | ICD-10-CM | POA: Diagnosis not present

## 2023-01-16 ENCOUNTER — Other Ambulatory Visit (INDEPENDENT_AMBULATORY_CARE_PROVIDER_SITE_OTHER): Payer: 59

## 2023-01-16 ENCOUNTER — Ambulatory Visit (INDEPENDENT_AMBULATORY_CARE_PROVIDER_SITE_OTHER): Payer: 59

## 2023-01-16 DIAGNOSIS — Z7689 Persons encountering health services in other specified circumstances: Secondary | ICD-10-CM | POA: Diagnosis not present

## 2023-01-16 DIAGNOSIS — G43709 Chronic migraine without aura, not intractable, without status migrainosus: Secondary | ICD-10-CM

## 2023-01-16 DIAGNOSIS — Z111 Encounter for screening for respiratory tuberculosis: Secondary | ICD-10-CM

## 2023-01-16 LAB — C-REACTIVE PROTEIN: CRP: <1 mg/dL (ref 0.5–20.0)

## 2023-01-16 LAB — VITAMIN D 25 HYDROXY (VIT D DEFICIENCY, FRACTURES): VITD: 24.94 ng/mL — ABNORMAL LOW (ref 30.00–100.00)

## 2023-01-16 LAB — B12 AND FOLATE PANEL
Folate: 17.1 ng/mL (ref >5.9)
Vitamin B-12: 524 pg/mL (ref 211–911)

## 2023-01-16 LAB — SEDIMENTATION RATE: Sed Rate: 4 mm/hr (ref 0–15)

## 2023-01-16 NOTE — Progress Notes (Signed)
Nicholas Johnson 32 yr old male presents to office today for TB screening skin test per Berniece Pap, MD. Administered TUBERSOL ID right forearm. Patient tolerated well. Aware to come back on Thursday March 28th 2024 to have skin test read. Will need clinic to provide TB skin test result letter.

## 2023-01-16 NOTE — Progress Notes (Signed)
Send 50000 vitamin D weekly for 4 weeks.

## 2023-01-17 ENCOUNTER — Ambulatory Visit: Payer: 59 | Admitting: Internal Medicine

## 2023-01-17 ENCOUNTER — Ambulatory Visit (HOSPITAL_BASED_OUTPATIENT_CLINIC_OR_DEPARTMENT_OTHER): Payer: 59

## 2023-01-18 ENCOUNTER — Telehealth: Payer: Self-pay | Admitting: Internal Medicine

## 2023-01-18 ENCOUNTER — Ambulatory Visit: Payer: 59

## 2023-01-18 DIAGNOSIS — Z7689 Persons encountering health services in other specified circumstances: Secondary | ICD-10-CM | POA: Diagnosis not present

## 2023-01-18 LAB — D-DIMER, QUANTITATIVE: D-Dimer, Quant: <0.19 mcg/mL FEU (ref <0.50)

## 2023-01-18 LAB — ANA, IFA COMPREHENSIVE PANEL
Anti Nuclear Antibody (ANA): NEGATIVE
ENA SM Ab Ser-aCnc: <1 AI
SM/RNP: <1 AI
SSA (Ro) (ENA) Antibody, IgG: <1 AI
SSB (La) (ENA) Antibody, IgG: <1 AI
Scleroderma (Scl-70) (ENA) Antibody, IgG: <1 AI
ds DNA Ab: 1 IU/mL

## 2023-01-18 LAB — PATHOLOGIST SMEAR REVIEW

## 2023-01-18 LAB — TB SKIN TEST
Induration: 0 mm
TB Skin Test: NEGATIVE

## 2023-01-18 LAB — HIV ANTIBODY (ROUTINE TESTING W REFLEX): HIV 1&2 Ab, 4th Generation: NONREACTIVE

## 2023-01-18 NOTE — Telephone Encounter (Signed)
Patient had a  esophageal manometry performed 4-5days ago. Patient states sob and swallowing has been worse since procedure. Patient called hospital were procedure was performed states they didn't seem helpful. States procedure was painful and stated this during procedure.  Patient has been offered a acute visit and is not able to come in today    Dr. Melvyn Novas can you please advise

## 2023-01-18 NOTE — Telephone Encounter (Signed)
Nothing to offer from pulmonary perspective, should discuss with the physician who performed the procedure and go to ER of facility where it was done if getting worse in meantime

## 2023-01-19 NOTE — Telephone Encounter (Signed)
Called and spoke with patient. Patient verbalized understanding. Nothing further needed.  

## 2023-01-20 DIAGNOSIS — F4323 Adjustment disorder with mixed anxiety and depressed mood: Secondary | ICD-10-CM | POA: Diagnosis not present

## 2023-01-22 ENCOUNTER — Encounter: Payer: Self-pay | Admitting: Internal Medicine

## 2023-01-22 ENCOUNTER — Ambulatory Visit (INDEPENDENT_AMBULATORY_CARE_PROVIDER_SITE_OTHER): Payer: Medicaid Other | Admitting: Internal Medicine

## 2023-01-22 VITALS — BP 110/80 | HR 67 | Temp 98.0°F | Ht 68.0 in | Wt 113.6 lb

## 2023-01-22 DIAGNOSIS — R252 Cramp and spasm: Secondary | ICD-10-CM | POA: Diagnosis not present

## 2023-01-22 DIAGNOSIS — R1310 Dysphagia, unspecified: Secondary | ICD-10-CM | POA: Diagnosis not present

## 2023-01-22 DIAGNOSIS — R490 Dysphonia: Secondary | ICD-10-CM | POA: Insufficient documentation

## 2023-01-22 DIAGNOSIS — Z419 Encounter for procedure for purposes other than remedying health state, unspecified: Secondary | ICD-10-CM | POA: Diagnosis not present

## 2023-01-22 DIAGNOSIS — R0609 Other forms of dyspnea: Secondary | ICD-10-CM

## 2023-01-22 DIAGNOSIS — G43709 Chronic migraine without aura, not intractable, without status migrainosus: Secondary | ICD-10-CM | POA: Diagnosis not present

## 2023-01-22 DIAGNOSIS — G959 Disease of spinal cord, unspecified: Secondary | ICD-10-CM | POA: Diagnosis not present

## 2023-01-22 DIAGNOSIS — J385 Laryngeal spasm: Secondary | ICD-10-CM | POA: Diagnosis not present

## 2023-01-22 MED ORDER — BACLOFEN 10 MG PO TABS: 10.0000 mg | ORAL_TABLET | Freq: Three times a day (TID) | ORAL | 0 refills | Status: DC
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
## 2023-01-22 NOTE — Progress Notes (Signed)
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 Performed at [facility], [place] [address] [place], [place] [address]   Pulmonary function test     Status: None   Collection Time: 11/16/22 10:43 AM  Result Value Ref Range   FVC-Pre 4.79 L   FVC-%Pred-Pre 93 %   FEV1-Pre 3.88 L   FEV1-%Pred-Pre 92 %   FEV6-Pre 4.78 L   FEV6-%Pred-Pre 94 %   Pre FEV1/FVC ratio 81 %   FEV1FVC-%Pred-Pre 98 %   Pre FEV6/FVC Ratio 100 %   FEV6FVC-%Pred-Pre 101 %   FEF 25-75 Pre 3.88 L/sec   FEF2575-%Pred-Pre 90 %  CBC     Status:  None   Collection Time: 11/16/22  1:02 PM  Result Value Ref Range   WBC 5.1 4.0 - 10.5 K/uL   RBC 5.24 4.22 - 5.81 MIL/uL   Hemoglobin 14.6 13.0 - 17.0 g/dL   HCT 42.0 39.0 - 52.0 %   MCV 80.2 80.0 - 100.0 fL   MCH 27.9 26.0 - 34.0 pg   MCHC 34.8 30.0 - 36.0 g/dL   RDW 12.2 11.5 - 15.5 %   Platelets 203 150 - 400 K/uL   nRBC 0.0 0.0 - 0.2 %    Comment: Performed at [facility], [address] [place], [place] [address]  Comprehensive metabolic panel     Status: Abnormal   Collection Time: 11/16/22  1:02 PM  Result Value Ref Range   Sodium 140 135 - 145 mmol/L   Potassium 3.8 3.5 - 5.1 mmol/L   Chloride 106 98 - 111 mmol/L   CO2 24 22 - 32 mmol/L   Glucose, Bld 90 70 - 99 mg/dL    Comment: Glucose reference range applies only to samples taken after fasting for at least 8 hours.   BUN 9 6 - 20 mg/dL   Creatinine, Ser 0.81 0.61 - 1.24 mg/dL   Calcium 9.9 8.9 - 10.3 mg/dL   Total Protein 8.3 (H) 6.5 - 8.1 g/dL   Albumin 5.1 (H) 3.5 - 5.0 g/dL   AST 20 15 - 41 U/L   ALT 12 0 - 44 U/L   Alkaline Phosphatase 41 38 - 126 U/L   Total Bilirubin 0.7 0.3 - 1.2 mg/dL   GFR, Estimated >60 >60 mL/min    Comment: (NOTE) Calculated using the CKD-EPI Creatinine Equation (2021)    Anion gap 10 5 - 15    Comment: Performed at [facility], [address], [place], [place] [address]  Troponin I (High Sensitivity)     Status: None   Collection Time: 11/16/22  1:02 PM  Result Value Ref Range   Troponin I (High Sensitivity) 6 <18 ng/L    Comment: (NOTE) Elevated high sensitivity troponin I (hsTnI) values and significant  changes across serial measurements may suggest ACS but many other  chronic and acute conditions are known to elevate hsTnI results.  Refer to the "Links" section for chest pain algorithms and additional  guidance. Performed at [facility], [address] [place], [place] [address]   Resp panel by RT-PCR (RSV, Flu A&B, Covid) Anterior Nasal  Swab     Status: None   Collection Time: 11/16/22  1:02 PM   Specimen: Anterior Nasal Swab  Result Value Ref Range   SARS Coronavirus 2 by RT PCR NEGATIVE NEGATIVE    Comment: (NOTE) SARS-CoV-2 target nucleic acids are NOT DETECTED.  The SARS-CoV-2 RNA is generally detectable in upper respiratory specimens during the acute phase of infection. The lowest concentration of SARS-CoV-2 viral copies this assay can detect is 138 copies/mL. A negative result does not preclude SARS-Cov-2 infection and should not be used as the sole basis for treatment or other patient management decisions. A negative result may occur with  improper specimen collection/handling, submission of specimen other than nasopharyngeal swab, presence of viral mutation(s) within the areas targeted by this assay, and inadequate number of viral copies(<138 copies/mL). A negative result must be combined with clinical observations, patient history, and epidemiological information. The expected result is Negative.  Fact Sheet for Patients:  EntrepreneurPulse.com.au  Fact Sheet for Healthcare Providers:  IncredibleEmployment.be  This test is no t yet approved or cleared by the [place] FDA and  has been authorized for detection and/or diagnosis of SARS-CoV-2 by FDA under an Emergency Use Authorization (EUA). This EUA will remain  in effect (meaning this test can be used) for the duration of the COVID-19 declaration under Section 564(b)(1) of the Act, 21 U.S.C.section 360bbb-3(b)(1), unless the authorization is terminated  or revoked sooner.       Influenza A by PCR NEGATIVE NEGATIVE   Influenza B by PCR NEGATIVE NEGATIVE    Comment: (NOTE) The Xpert Xpress SARS-CoV-2/FLU/RSV plus assay is intended as an aid in the diagnosis of influenza from Nasopharyngeal swab specimens and should not be used as a sole basis for treatment. Nasal washings and aspirates are unacceptable for  Xpert Xpress SARS-CoV-2/FLU/RSV testing.  Fact Sheet for Patients: EntrepreneurPulse.com.au  Fact Sheet for Healthcare Providers: IncredibleEmployment.be  This test is not yet approved or cleared by the [place] FDA and has been authorized for detection and/or diagnosis of SARS-CoV-2 by FDA under an Emergency Use Authorization (EUA). This EUA will remain in effect (meaning this test can be used) for the duration of the COVID-19 declaration under Section 564(b)(1) of the Act, 21 U.S.C. section 360bbb-3(b)(1), unless the authorization is terminated or revoked.     Resp Syncytial Virus by PCR NEGATIVE NEGATIVE    Comment: (NOTE) Fact Sheet for Patients: EntrepreneurPulse.com.au  Fact Sheet for Healthcare Providers: IncredibleEmployment.be  This test is not yet approved or cleared by the [place] FDA and has been authorized for detection and/or diagnosis of SARS-CoV-2 by FDA under an Emergency Use Authorization (EUA). This EUA will remain in effect (meaning this test can be used) for the duration of the COVID-19 declaration under Section 564(b)(1) of the Act, 21 U.S.C. section 360bbb-3(b)(1), unless the authorization is terminated or revoked.  Performed at [facility], [address], [place], [place] [id number]   Basic metabolic panel     Status:  None   Collection Time: 11/19/22  7:50 PM  Result Value Ref Range   Sodium 140 135 - 145 mmol/L   Potassium 3.6 3.5 - 5.1 mmol/L   Chloride 102 98 - 111 mmol/L   CO2 29 22 - 32 mmol/L   Glucose, Bld 80 70 - 99 mg/dL    Comment: Glucose reference range applies only to samples taken after fasting for at least 8 hours.   BUN 6 6 - 20 mg/dL   Creatinine, Ser 0.90 0.61 - 1.24 mg/dL   Calcium 10.1 8.9 - 10.3 mg/dL   GFR, Estimated >60 >60 mL/min    Comment: (NOTE) Calculated using the CKD-EPI Creatinine Equation (2021)    Anion gap 9 5 - 15     Comment: Performed at [facility], [address] [place], [place] [address]  CBC     Status: Abnormal   Collection Time: 11/19/22  7:50 PM  Result Value Ref Range   WBC 4.5 4.0 - 10.5 K/uL   RBC 5.05 4.22 - 5.81 MIL/uL   Hemoglobin 14.4 13.0 - 17.0 g/dL   HCT 40.2 39.0 - 52.0 %   MCV 79.6 (L) 80.0 - 100.0 fL   MCH 28.5 26.0 - 34.0 pg   MCHC 35.8 30.0 - 36.0 g/dL   RDW 12.5 11.5 - 15.5 %   Platelets 185 150 - 400 K/uL   nRBC 0.0 0.0 - 0.2 %    Comment: Performed at [facility], [place], [place] [address]  Troponin I (High Sensitivity)     Status: None   Collection Time: 11/19/22  7:50 PM  Result Value Ref Range   Troponin I (High Sensitivity) 5 <18 ng/L    Comment: (NOTE) Elevated high sensitivity troponin I (hsTnI) values and significant  changes across serial measurements may suggest ACS but many other  chronic and acute conditions are known to elevate hsTnI results.  Refer to the "Links" section for chest pain algorithms and additional  guidance. Performed at [facility], [address] [place], [place] [address]   Troponin I (High Sensitivity)     Status: None   Collection Time: 11/19/22 10:10 PM  Result Value Ref Range   Troponin I (High Sensitivity) 6 <18 ng/L    Comment: (NOTE) Elevated high sensitivity troponin I (hsTnI) values and significant  changes across serial measurements may suggest ACS but many other  chronic and acute conditions are known to elevate hsTnI results.  Refer to the "Links" section for chest pain algorithms and additional  guidance. Performed at [facility], [address] [place], [place] [address]   Pulmonary Function Test     Status: None   Collection Time: 11/20/22  1:06 PM  Result Value Ref Range   FVC-Pre 4.84 L   FVC-%Pred-Pre 94 %   FEV1-Pre 3.98 L   FEV1-%Pred-Pre 94 %   FEV6-Pre 4.84 L   FEV6-%Pred-Pre 95 %   Pre FEV1/FVC  ratio 82 %   FEV1FVC-%Pred-Pre 100 %   Pre FEV6/FVC Ratio 100 %   FEV6FVC-%Pred-Pre 101 %   FEF 25-75 Pre 3.97 L/sec   FEF2575-%Pred-Pre 92 %  Basic metabolic panel     Status: Abnormal   Collection Time: 11/22/22  1:00 PM  Result Value Ref Range   Sodium 137 135 - 145 mmol/L   Potassium 4.1 3.5 - 5.1 mmol/L   Chloride 103 98 - 111 mmol/L   CO2 27 22 - 32 mmol/L  Glucose, Bld 92 70 - 99 mg/dL    Comment: Glucose reference range applies only to samples taken after fasting for at least 8 hours.   BUN 5 (L) 6 - 20 mg/dL   Creatinine, Ser 0.82 0.61 - 1.24 mg/dL   Calcium 9.7 8.9 - 10.3 mg/dL   GFR, Estimated >60 >60 mL/min    Comment: (NOTE) Calculated using the CKD-EPI Creatinine Equation (2021)    Anion gap 7 5 - 15    Comment: Performed at [facility], [place] [address] [place], [place] [address]  CBC     Status: None   Collection Time: 11/22/22  1:00 PM  Result Value Ref Range   WBC 4.1 4.0 - 10.5 K/uL   RBC 5.14 4.22 - 5.81 MIL/uL   Hemoglobin 14.4 13.0 - 17.0 g/dL   HCT 41.7 39.0 - 52.0 %   MCV 81.1 80.0 - 100.0 fL   MCH 28.0 26.0 - 34.0 pg   MCHC 34.5 30.0 - 36.0 g/dL   RDW 12.5 11.5 - 15.5 %   Platelets 216 150 - 400 K/uL   nRBC 0.0 0.0 - 0.2 %    Comment: Performed at [facility], [place] [address] [place], [place] [address]  Troponin I (High Sensitivity)     Status: None   Collection Time: 11/22/22  1:00 PM  Result Value Ref Range   Troponin I (High Sensitivity) 6 <18 ng/L    Comment: (NOTE) Elevated high sensitivity troponin I (hsTnI) values and significant  changes across serial measurements may suggest ACS but many other  chronic and acute conditions are known to elevate hsTnI results.  Refer to the "Links" section for chest pain algorithms and additional  guidance. Performed at [facility], [place] [address], [place], [place] [address]   I-stat chem 8, ED (not at [facility], [facility] or [facility])     Status: Abnormal    Collection Time: 11/22/22  1:16 PM  Result Value Ref Range   Sodium 140 135 - 145 mmol/L   Potassium 4.2 3.5 - 5.1 mmol/L   Chloride 100 98 - 111 mmol/L   BUN <3 (L) 6 - 20 mg/dL   Creatinine, Ser 0.80 0.61 - 1.24 mg/dL   Glucose, Bld 89 70 - 99 mg/dL    Comment: Glucose reference range applies only to samples taken after fasting for at least 8 hours.   Calcium, Ion 1.26 1.15 - 1.40 mmol/L   TCO2 26 22 - 32 mmol/L   Hemoglobin 14.6 13.0 - 17.0 g/dL   HCT 43.0 39.0 - 52.0 %  Troponin I (High Sensitivity)     Status: None   Collection Time: 11/22/22  2:41 PM  Result Value Ref Range   Troponin I (High Sensitivity) 5 <18 ng/L    Comment: (NOTE) Elevated high sensitivity troponin I (hsTnI) values and significant  changes across serial measurements may suggest ACS but many other  chronic and acute conditions are known to elevate hsTnI results.  Refer to the "Links" section for chest pain algorithms and additional  guidance. Performed at [facility], [place] [address] [place],  [address]   Sedimentation rate     Status: None   Collection Time: 11/24/22  9:33 AM  Result Value Ref Range   Sed Rate 2 0 - 15 mm/hr  C-reactive protein     Status: None   Collection Time: 11/24/22  9:33 AM  Result Value Ref Range   CRP 6  0 - 10 mg/L  TSH Rfx on Abnormal to Free T4     Status: None   Collection Time: 12/18/22 11:01 AM  Result Value Ref Range   TSH 1.300 0.450 - 4.500 uIU/mL  Lipid panel     Status: None   Collection Time: 12/18/22 11:01 AM  Result Value Ref Range   Cholesterol, Total 158 100 - 199 mg/dL   Triglycerides 53 0 - 149 mg/dL   HDL 48 >39 mg/dL   VLDL Cholesterol Cal 11 5 - 40 mg/dL   LDL Chol Calc (NIH) 99 0 - 99 mg/dL   Chol/HDL Ratio 3.3 0.0 - 5.0 ratio    Comment:                                   T. Chol/HDL Ratio                                             Men  Women                               1/2 Avg.Risk  3.4    3.3                                    Avg.Risk  5.0    4.4                                2X Avg.Risk  9.6    7.1                                3X Avg.Risk 23.4   11.0   Comprehensive metabolic panel     Status: Abnormal   Collection Time: 12/18/22 11:01 AM  Result Value Ref Range   Glucose 88 70 - 99 mg/dL   BUN 4 (L) 6 - 20 mg/dL   Creatinine, Ser 0.95 0.76 - 1.27 mg/dL   eGFR 110 >59 mL/min/1.73   BUN/Creatinine Ratio 4 (L) 9 - 20   Sodium 144 134 - 144 mmol/L   Potassium 4.0 3.5 - 5.2 mmol/L   Chloride 103 96 - 106 mmol/L   CO2 21 20 - 29 mmol/L   Calcium 9.8 8.7 - 10.2 mg/dL   Total Protein 7.1 6.0 - 8.5 g/dL   Albumin 4.8 4.3 - 5.2 g/dL   Globulin, Total 2.3 1.5 - 4.5 g/dL   Albumin/Globulin Ratio 2.1 1.2 - 2.2   Bilirubin Total 0.3 0.0 - 1.2 mg/dL   Alkaline Phosphatase 55 44 - 121 IU/L   AST 14 0 - 40 IU/L   ALT 10 0 - 44 IU/L  CBC with Differential/Platelet     Status: Abnormal   Collection Time: 12/18/22 11:01 AM  Result Value Ref Range   WBC 3.0 (L) 3.4 - 10.8 x10E3/uL   RBC 4.83 4.14 - 5.80 x10E6/uL   Hemoglobin 14.1 13.0 - 17.7 g/dL   Hematocrit 40.5 37.5 - 51.0 %   MCV 84 79 - 97 fL   MCH 29.2 26.6 - 33.0 pg  MCHC 34.8 31.5 - 35.7 g/dL   RDW 13.1 11.6 - 15.4 %   Platelets 195 150 - 450 x10E3/uL   Neutrophils 45 Not Estab. %   Lymphs 43 Not Estab. %   Monocytes 10 Not Estab. %   Eos 1 Not Estab. %   Basos 1 Not Estab. %   Neutrophils Absolute 1.4 1.4 - 7.0 x10E3/uL   Lymphocytes Absolute 1.3 0.7 - 3.1 x10E3/uL   Monocytes Absolute 0.3 0.1 - 0.9 x10E3/uL   EOS (ABSOLUTE) 0.0 0.0 - 0.4 x10E3/uL   Basophils Absolute 0.0 0.0 - 0.2 x10E3/uL   Immature Granulocytes 0 Not Estab. %   Immature Grans (Abs) 0.0 0.0 - 0.1 x10E3/uL  ECHOCARDIOGRAM COMPLETE     Status: None   Collection Time: 12/18/22  2:14 PM  Result Value Ref Range   Area-P 1/2 3.24 cm2   S' Lateral 3.00 cm   Est EF 60 - 65%   ANA, IFA Comprehensive Panel-([facility])     Status: None   Collection Time: 01/16/23  9:39  AM  Result Value Ref Range   Anti Nuclear Antibody (ANA) NEGATIVE NEGATIVE    Comment: ANA IFA is a first line screen for detecting the presence of up to approximately 150 autoantibodies in various autoimmune diseases. A negative ANA IFA result suggests an ANA-associated autoimmune disease is not present at this time, but is not definitive. If there is high clinical suspicion for Sjogren's syndrome, testing for anti-SS-A/Ro antibody should be considered. Anti-Jo-1 antibody should be considered for clinically suspected inflammatory myopathies. . AC-0: Negative . International Consensus on ANA Patterns (https://www.hernandez-brewer.com/) . For additional information, please refer to http://education.[facility].com/faq/FAQ177 (This link is being provided for informational/ educational purposes only.) .    ds DNA Ab 1 IU/mL    Comment:                            IU/mL       Interpretation                            < or = 4    Negative                            5-9         Indeterminate                            > or = 10   Positive .    Scleroderma (Scl-70) (ENA) Antibody, IgG <1.0 NEG <1.0 NEG AI   ENA SM Ab Ser-aCnc <1.0 NEG <1.0 NEG AI   SM/RNP <1.0 NEG <1.0 NEG AI   SSA (Ro) (ENA) Antibody, IgG <1.0 NEG <1.0 NEG AI   SSB (La) (ENA) Antibody, IgG <1.0 NEG <1.0 NEG AI  C-reactive protein     Status: None   Collection Time: 01/16/23  9:39 AM  Result Value Ref Range   CRP <1.0 0.5 - 20.0 mg/dL  Sedimentation rate     Status: None   Collection Time: 01/16/23  9:39 AM  Result Value Ref Range   Sed Rate 4 0 - 15 mm/hr  Vitamin D (25 hydroxy)     Status: Abnormal   Collection Time: 01/16/23  9:39 AM  Result Value Ref Range   VITD 24.94 (L) 30.00 - 100.00 ng/mL  B12 and Folate Panel     Status: None   Collection Time: 01/16/23  9:39 AM  Result Value Ref Range   Vitamin B-12 524 211 - 911 pg/mL   Folate 17.1 >5.9 ng/mL  HIV antibody (with reflex)     Status:  None   Collection Time: 01/16/23  9:39 AM  Result Value Ref Range   HIV 1&2 Ab, 4th Generation NON-REACTIVE NON-REACTIVE    Comment: HIV-1 antigen and HIV-1/HIV-2 antibodies were not detected. There is no laboratory evidence of HIV infection. Marland Kitchen PLEASE NOTE: This information has been disclosed to you from records whose confidentiality may be protected by state law.  If your state requires such protection, then the state law prohibits you from making any further disclosure of the information without the specific written consent of the person to whom it pertains, or as otherwise permitted by law. A general authorization for the release of medical or other information is NOT sufficient for this purpose. . For additional information please refer to http://education.[facility].com/faq/FAQ106 (This link is being provided for informational/ educational purposes only.) . Marland Kitchen The performance of this assay has not been clinically validated in patients less than 60 years old. .   Pathologist smear review     Status: None   Collection Time: 01/16/23  9:39 AM  Result Value Ref Range   Path Review      Comment: Myeloid population consists predominantly of mature segmented neutrophils with reactive changes. A few lymphocytes appear reactive. RBCs and platelets are unremarkable. Reviewed by Francis Gaines Rockne Coons, MD  (Electronic Signature on File)     01/17/2023   D-Dimer, Quantitative     Status: None   Collection Time: 01/16/23  9:39 AM  Result Value Ref Range   D-Dimer, Quant <0.19 <0.50 mcg/mL FEU    Comment: . The D-Dimer test is used frequently to exclude an acute PE or DVT. In patients with a low to moderate clinical risk assessment and a D-Dimer result <0.50 mcg/mL FEU, the likelihood of a PE or DVT is very low. However, a thromboembolic event should not be excluded solely on the basis of the D-Dimer level. Increased levels of D-Dimer are associated with a PE, DVT,  DIC, malignancies, inflammation, sepsis, surgery, trauma, pregnancy, and advancing patient age. [Jama 2006 11:295(2):199-207] . For additional information, please refer to: http://education.[facility].com/faq/FAQ149 (This link is being provided for informational/ educational purposes only) .   PPD     Status: Normal   Collection Time: 01/18/23  9:44 AM  Result Value Ref Range   TB Skin Test Negative    Induration 0 mm    No image results found.   DG Chest Port 1 View  Result Date: 12/24/2022 CLINICAL DATA:  Shortness of breath.  History of GERD. EXAM: PORTABLE CHEST 1 VIEW COMPARISON:  11/22/2022. FINDINGS: The heart size and mediastinal contours are within normal limits. No consolidation, effusion, or pneumothorax. No acute osseous abnormality. IMPRESSION: No active disease. Electronically Signed   By: Brett Fairy M.D.   On: 12/24/2022 00:48   ECHOCARDIOGRAM COMPLETE  Result Date: 12/18/2022    ECHOCARDIOGRAM REPORT   Patient Name:   Yichen Sivers. Date of Exam: 12/18/2022 Medical Rec #:  [id number]          Height:       63.0 in Accession #:    [id number]         Weight:       117.2 lb Date of Birth:  06/13/1991  BSA:          1.541 m Patient Age:    31 years           BP:           112/78 mmHg Patient Gender: M                  HR:           72 bpm. Exam Location:  [address] Procedure: 2D Echo, Cardiac Doppler and Color Doppler Indications:    R07.9 Chest pain  History:        Patient has no prior history of Echocardiogram examinations.  Sonographer:    Cresenciano Lick RDCS Referring Phys: [id number] [place]  1. Left ventricular ejection fraction, by estimation, is 60 to 65%. The left ventricle has normal function. The left ventricle has no regional wall motion abnormalities. Left ventricular diastolic parameters were normal.  2. Right ventricular systolic function is normal. The right ventricular size is normal.  3. The mitral valve is  normal in structure. No evidence of mitral valve regurgitation. No evidence of mitral stenosis.  4. The aortic valve is normal in structure. Aortic valve regurgitation is not visualized. No aortic stenosis is present.  5. The inferior vena cava is normal in size with greater than 50% respiratory variability, suggesting right atrial pressure of 3 mmHg. FINDINGS  Left Ventricle: Left ventricular ejection fraction, by estimation, is 60 to 65%. The left ventricle has normal function. The left ventricle has no regional wall motion abnormalities. The left ventricular internal cavity size was normal in size. There is  no left ventricular hypertrophy. Left ventricular diastolic parameters were normal. Right Ventricle: The right ventricular size is normal. No increase in right ventricular wall thickness. Right ventricular systolic function is normal. Left Atrium: Left atrial size was normal in size. Right Atrium: Right atrial size was normal in size. Pericardium: There is no evidence of pericardial effusion. Mitral Valve: The mitral valve is normal in structure. No evidence of mitral valve regurgitation. No evidence of mitral valve stenosis. Tricuspid Valve: The tricuspid valve is normal in structure. Tricuspid valve regurgitation is not demonstrated. No evidence of tricuspid stenosis. Aortic Valve: The aortic valve is normal in structure. Aortic valve regurgitation is not visualized. No aortic stenosis is present. Pulmonic Valve: The pulmonic valve was normal in structure. Pulmonic valve regurgitation is not visualized. No evidence of pulmonic stenosis. Aorta: The aortic root is normal in size and structure. Venous: The inferior vena cava is normal in size with greater than 50% respiratory variability, suggesting right atrial pressure of 3 mmHg. IAS/Shunts: No atrial level shunt detected by color flow Doppler.  LEFT VENTRICLE PLAX 2D LVIDd:         4.60 cm   Diastology LVIDs:         3.00 cm   LV e' medial:    14.55 cm/s LV  PW:         1.00 cm   LV E/e' medial:  6.5 LV IVS:        0.50 cm   LV e' lateral:   16.45 cm/s LVOT diam:     1.70 cm   LV E/e' lateral: 5.7 LV SV:         48 LV SV Index:   31 LVOT Area:     2.27 cm  RIGHT VENTRICLE          IVC RV Basal diam:  3.50 cm  IVC  diam: 1.40 cm LEFT ATRIUM             Index        RIGHT ATRIUM           Index LA diam:        2.90 cm 1.88 cm/m   RA Area:     10.20 cm LA Vol (A2C):   33.7 ml 21.87 ml/m  RA Volume:   25.70 ml  16.68 ml/m LA Vol (A4C):   24.3 ml 15.77 ml/m LA Biplane Vol: 29.5 ml 19.15 ml/m  AORTIC VALVE LVOT Vmax:   108.67 cm/s LVOT Vmean:  69.800 cm/s LVOT VTI:    0.212 m  AORTA Ao Root diam: 2.60 cm Ao Asc diam:  2.40 cm MITRAL VALVE MV Area (PHT): 3.24 cm    SHUNTS MV Decel Time: 234 msec    Systemic VTI:  0.21 m MV E velocity: 94.30 cm/s  Systemic Diam: 1.70 cm MV A velocity: 50.25 cm/s MV E/A ratio:  1.88 Aditya Sabharwal Electronically signed by Hebert Soho Signature Date/Time: 12/18/2022/3:23:23 PM    Final    CT Chest W Contrast  Result Date: 11/22/2022 CLINICAL DATA:  Chest pain after upper endoscopy earlier today. EXAM: CT CHEST WITH CONTRAST TECHNIQUE: Multidetector CT imaging of the chest was performed during intravenous contrast administration. RADIATION DOSE REDUCTION: This exam was performed according to the departmental dose-optimization program which includes automated exposure control, adjustment of the mA and/or kV according to patient size and/or use of iterative reconstruction technique. CONTRAST:  39mL OMNIPAQUE IOHEXOL 300 MG/ML  SOLN COMPARISON:  Prior CT of the chest on 01/29/2021 FINDINGS: Cardiovascular: The thoracic aorta is normal and demonstrates no atherosclerosis, aneurysm or dissection. The heart size is normal. No pericardial fluid identified. No evidence of calcified coronary artery plaque. Central pulmonary arteries are normal in caliber. Mediastinum/Nodes: No enlarged mediastinal, hilar, or axillary lymph nodes. Thyroid  gland, trachea, and esophagus demonstrate no significant findings. No pneumomediastinum. The patient swallowed oral contrast just prior to the scan and there is no evidence of extravasated oral contrast to suggest esophageal perforation. Lungs/Pleura: There is no evidence of pulmonary edema, consolidation, pneumothorax, nodule or pleural fluid. Upper Abdomen: No acute abnormality. Musculoskeletal: No chest wall abnormality. No acute or significant osseous findings. IMPRESSION: Normal CT of the chest with contrast. No evidence of pneumomediastinum or extravasated oral contrast to suggest esophageal perforation. Electronically Signed   By: Aletta Edouard M.D.   On: 11/22/2022 16:49   DG Chest Port 1 View  Result Date: 11/22/2022 CLINICAL DATA:  Chest pain following endoscopy. EXAM: PORTABLE CHEST 1 VIEW COMPARISON:  Chest x-ray 11/19/2022 FINDINGS: The cardiac silhouette, mediastinal and hilar contours are within normal limits. The lungs are clear. No infiltrates, edema, effusions or pneumothorax. I do not see any air under the

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
## 2023-01-23 ENCOUNTER — Encounter: Payer: Self-pay | Admitting: Internal Medicine

## 2023-01-23 ENCOUNTER — Ambulatory Visit (INDEPENDENT_AMBULATORY_CARE_PROVIDER_SITE_OTHER)
Admission: RE | Admit: 2023-01-23 | Discharge: 2023-01-23 | Disposition: A | Discharge status: Home / Self Care | Payer: Medicaid Other | Source: Ambulatory Visit | Attending: Internal Medicine | Admitting: Internal Medicine

## 2023-01-23 ENCOUNTER — Ambulatory Visit (INDEPENDENT_AMBULATORY_CARE_PROVIDER_SITE_OTHER): Payer: Medicaid Other | Admitting: Orthopedic Surgery

## 2023-01-23 ENCOUNTER — Encounter: Payer: Self-pay | Admitting: Orthopedic Surgery

## 2023-01-23 VITALS — BP 115/77 | HR 87 | Ht 68.0 in | Wt 115.0 lb

## 2023-01-23 DIAGNOSIS — R1310 Dysphagia, unspecified: Secondary | ICD-10-CM

## 2023-01-23 DIAGNOSIS — R0609 Other forms of dyspnea: Secondary | ICD-10-CM | POA: Diagnosis not present

## 2023-01-23 DIAGNOSIS — M542 Cervicalgia: Secondary | ICD-10-CM
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
## 2023-01-24 ENCOUNTER — Encounter (HOSPITAL_BASED_OUTPATIENT_CLINIC_OR_DEPARTMENT_OTHER): Payer: 59 | Admitting: Family Medicine

## 2023-01-24 ENCOUNTER — Other Ambulatory Visit: Payer: Self-pay | Admitting: Internal Medicine

## 2023-01-24 ENCOUNTER — Encounter: Payer: Self-pay | Admitting: Internal Medicine

## 2023-01-24 ENCOUNTER — Ambulatory Visit: Payer: 59 | Admitting: Internal Medicine

## 2023-01-24 ENCOUNTER — Other Ambulatory Visit: Payer: Self-pay

## 2023-01-24 DIAGNOSIS — M5030 Other cervical disc degeneration, unspecified cervical region: Secondary | ICD-10-CM

## 2023-01-24 DIAGNOSIS — R252 Cramp and spasm: Secondary | ICD-10-CM

## 2023-01-24 NOTE — Telephone Encounter (Signed)
Spoke with patient on the phone concerning this.

## 2023-01-25 ENCOUNTER — Telehealth: Payer: 59 | Admitting: Neurology

## 2023-01-25 ENCOUNTER — Telehealth: Payer: Self-pay | Admitting: Nurse Practitioner

## 2023-01-25 ENCOUNTER — Other Ambulatory Visit: Payer: Self-pay

## 2023-01-25 DIAGNOSIS — R070 Pain in throat: Secondary | ICD-10-CM | POA: Diagnosis not present

## 2023-01-25 DIAGNOSIS — Z7689 Persons encountering health services in other specified circumstances: Secondary | ICD-10-CM | POA: Diagnosis not present

## 2023-01-25 DIAGNOSIS — K224 Dyskinesia of esophagus: Secondary | ICD-10-CM | POA: Diagnosis not present

## 2023-01-25 NOTE — Telephone Encounter (Signed)
Pt called the office about sleep study. States he received a call from [employer] about the sleep study and they told him that there might be an up front cost that he will have to pay. Pt wants to speak to [facility] about this prior to having this scheduled. Routing to PCCs.

## 2023-01-26 ENCOUNTER — Telehealth: Payer: Self-pay | Admitting: Internal Medicine

## 2023-01-26 NOTE — Telephone Encounter (Signed)
Spoke to [employer] who advised no PA req for [id number], however, we are unable to bill both codes on same date. Pt has met decuctible & OOP so should b covered at 100% ref# [id number]   Email sent to Marisue Ivan about billing for the codes & documented on appt note as well.    Spoke to pt & advised that he should be covered at 100% per [employer]. Scheduled w/ Johny Drilling for [facility] 4/11 @ 4:30. Provided Sherry's number if he needs to reschedule. Nothing further needed at this time.

## 2023-01-26 NOTE — Telephone Encounter (Signed)
[facility] called and states per pts insurance they needs a pre Authorization to get the MRI done. Please advise.

## 2023-01-27 ENCOUNTER — Encounter: Payer: Self-pay | Admitting: Orthopedic Surgery

## 2023-01-27 DIAGNOSIS — F4323 Adjustment disorder with mixed anxiety and depressed mood: Secondary | ICD-10-CM | POA: Diagnosis not present

## 2023-01-29 ENCOUNTER — Encounter (HOSPITAL_COMMUNITY): Payer: Self-pay | Admitting: *Deleted

## 2023-01-29 ENCOUNTER — Encounter: Payer: Self-pay | Admitting: Neurology

## 2023-01-29 ENCOUNTER — Encounter (HOSPITAL_COMMUNITY): Payer: Self-pay | Admitting: Emergency Medicine

## 2023-01-29 ENCOUNTER — Other Ambulatory Visit: Payer: Self-pay

## 2023-01-29 ENCOUNTER — Telehealth: Payer: Self-pay | Admitting: Orthopedic Surgery

## 2023-01-29 ENCOUNTER — Telehealth: Payer: Self-pay | Admitting: Internal Medicine

## 2023-01-29 DIAGNOSIS — Z7689 Persons encountering health services in other specified circumstances: Secondary | ICD-10-CM | POA: Diagnosis not present

## 2023-01-29 DIAGNOSIS — R1319 Other dysphagia: Secondary | ICD-10-CM | POA: Diagnosis not present

## 2023-01-29 DIAGNOSIS — R1312 Dysphagia, oropharyngeal phase: Secondary | ICD-10-CM | POA: Diagnosis not present

## 2023-01-29 NOTE — Telephone Encounter (Signed)
Pt would like a call back, he has some questions about his MRI appt. Please advise.

## 2023-01-29 NOTE — Telephone Encounter (Signed)
See MRI results  

## 2023-01-29 NOTE — Progress Notes (Addendum)
Mr. Gundlach denies chest pain or shortness of breath.  Patient denies having any s/s of Covid in his household, also denies any known exposure to Covid. Mr. Deorio denies  any s/s of upper or lower respiratory in the past 8 weeks. Patient said he thinks he has blockage in his nose and that is why he has difficulty breathing.Mr. Adamic said that he "has used Flonase in the past and it does not work, so it is not my sinuses."  Mr. Wilkin reported that his nose feels more congested when he is lyiny flat.Patient stated that he sees 2 ENTs, one at [facility]  and Dr. Delford Field with [facility].that are trying to find out what is wrong.  Mr. Vogeler reports that he has has difficulty swallowing for about 3 years. Patient has been seen by Speech Path and GI Dr.  Mr. Youngblut reported having chest pain when he was waking up after EGD, patient said that, "I think it is because it took so long, it took 3 hours."  I said  3 hours sounds like a long time, patient said, "well my daddy said it was 2 hours."  Mr. Behrns sated he h ad to sit up after Endoscopy was over to get my breathing right and chest pain resolved.  Mr .Bailey said he is being transferring  via medical transport.  I asked who would be staying with him for the 1st 24 hours.  Patient said he was told he does not need anyone, by someone at [facility]. I explained to patient that the anesthesiologist require having someone with you for 24 hours after sedation. Patient said he would see if his mother or daddy can stay with him.  Mr. Mardella Layman PCP is Dr. Glenetta Hew. Patient saw Jeanelle Malling, PA with [facility] health Cardiologist.  I received a call from [facility] MRI scheduler, she asked me if patients need someone with then after  having MRI with anesthesia. I told her yes. (Mr. Lohse called scheduler.)  I asked Rica Mast, NP to review chart.   Mr. Hays called back, he said he has it worked out for someone to stay with him.

## 2023-01-29 NOTE — Anesthesia Preprocedure Evaluation (Signed)
Anesthesia Evaluation    Reviewed: Allergy & Precautions, Patient's Chart, lab work & pertinent test results  Airway        Dental   Pulmonary           Cardiovascular + DOE    12/18/2022 TTE 1. Left ventricular ejection fraction, by estimation, is 60 to 65%. The  left ventricle has normal function. The left ventricle has no regional  wall motion abnormalities. Left ventricular diastolic parameters were  normal.   2. Right ventricular systolic function is normal. The right ventricular  size is normal.   3. The mitral valve is normal in structure. No evidence of mitral valve  regurgitation. No evidence of mitral stenosis.   4. The aortic valve is normal in structure. Aortic valve regurgitation is  not visualized. No aortic stenosis is present.   5. The inferior vena cava is normal in size with greater than 50%  respiratory variability, suggesting right atrial pressure of 3 mmHg.     Neuro/Psych  Headaches PSYCHIATRIC DISORDERS         GI/Hepatic Neg liver ROS,GERD  ,,  Endo/Other    Renal/GU negative Renal ROS     Musculoskeletal  (+) Arthritis ,    Abdominal   Peds  Hematology Lab Results      Component                Value               Date                      WBC                      3.0 (L)             12/18/2022                HGB                      14.1                12/18/2022                HCT                      40.5                12/18/2022                MCV                      84                  12/18/2022                PLT                      195                 12/18/2022              Anesthesia Other Findings   Reproductive/Obstetrics                             Anesthesia Physical Anesthesia Plan  ASA: 2  Anesthesia Plan: General   Post-op Pain Management: Precedex and Tylenol  PO (pre-op)*   Induction: Intravenous  PONV Risk Score and Plan: Treatment  may vary due to age or medical condition, Midazolam and Ondansetron  Airway Management Planned: LMA and Video Laryngoscope Planned  Additional Equipment: None  Intra-op Plan:   Post-operative Plan: Extubation in OR  Informed Consent:      Dental advisory given  Plan Discussed with:   Anesthesia Plan Comments:        Anesthesia Quick Evaluation
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
## 2023-01-30 ENCOUNTER — Ambulatory Visit (HOSPITAL_COMMUNITY)
Admission: RE | Admit: 2023-01-30 | Discharge: 2023-01-30 | Disposition: A | Discharge status: Home / Self Care | Payer: Medicaid Other | Source: Ambulatory Visit | Attending: Internal Medicine | Admitting: Internal Medicine

## 2023-01-30 ENCOUNTER — Encounter (HOSPITAL_COMMUNITY): Admission: RE | Disposition: A | Discharge status: Home / Self Care | Payer: Self-pay | Source: Ambulatory Visit

## 2023-01-30 DIAGNOSIS — G959 Disease of spinal cord, unspecified: Secondary | ICD-10-CM | POA: Diagnosis not present

## 2023-01-30 DIAGNOSIS — M50322 Other cervical disc degeneration at C5-C6 level: Secondary | ICD-10-CM | POA: Diagnosis not present

## 2023-01-30 DIAGNOSIS — R1310 Dysphagia, unspecified: Secondary | ICD-10-CM | POA: Diagnosis not present

## 2023-01-30 DIAGNOSIS — Z539 Procedure and treatment not carried out, unspecified reason: Secondary | ICD-10-CM | POA: Diagnosis not present

## 2023-01-30 DIAGNOSIS — G9589 Other specified diseases of spinal cord: Secondary | ICD-10-CM | POA: Insufficient documentation

## 2023-01-30 DIAGNOSIS — M50323 Other cervical disc degeneration at C6-C7 level: Secondary | ICD-10-CM | POA: Diagnosis not present

## 2023-01-30 DIAGNOSIS — Z7689 Persons encountering health services in other specified circumstances: Secondary | ICD-10-CM | POA: Diagnosis not present

## 2023-01-30 DIAGNOSIS — M50321 Other cervical disc degeneration at C4-C5 level: Secondary | ICD-10-CM | POA: Diagnosis not present

## 2023-01-30 LAB — BASIC METABOLIC PANEL
Anion gap: 12 (ref 5–15)
BUN: <5 mg/dL — ABNORMAL LOW (ref 6–20)
CO2: 27 mmol/L (ref 22–32)
Calcium: 9.6 mg/dL (ref 8.9–10.3)
Chloride: 101 mmol/L (ref 98–111)
Creatinine, Ser: 0.88 mg/dL (ref 0.61–1.24)
GFR, Estimated: >60 mL/min (ref >60)
Glucose, Bld: 108 mg/dL — ABNORMAL HIGH (ref 70–99)
Potassium: 4.2 mmol/L (ref 3.5–5.1)
Sodium: 140 mmol/L (ref 135–145)

## 2023-01-30 SURGERY — MRI WITH ANESTHESIA
Anesthesia: General

## 2023-01-30 MED ORDER — CHLORHEXIDINE GLUCONATE 0.12 % MT SOLN
15.0000 mL | Freq: Once | OROMUCOSAL | Status: DC
  Filled 2023-01-30: qty 15

## 2023-01-30 MED ORDER — ORAL CARE MOUTH RINSE: 15.0000 mL | Freq: Once | OROMUCOSAL | Status: DC

## 2023-01-30 MED ORDER — LACTATED RINGERS IV SOLN: INTRAVENOUS | Status: DC

## 2023-01-30 NOTE — Progress Notes (Signed)
Patient stated he was not able to make arrangements for a ride home after his MRI with anesthesia.  He was planning to use Lyft.  He also does not have anyone to be with him until after his mom gets off work this evening.  Per Dr. Richardson Landry, patient cannot undergo anesthesia without a ride home.  Patient stated he will try MRI without anesthesia.  Spoke with MRI who said to bring patient to their waiting area and registration desk to get checked in.  OR desk and anesthesia made aware.

## 2023-01-31 ENCOUNTER — Ambulatory Visit (INDEPENDENT_AMBULATORY_CARE_PROVIDER_SITE_OTHER): Payer: Medicaid Other | Admitting: Internal Medicine

## 2023-01-31 ENCOUNTER — Encounter: Payer: Self-pay | Admitting: Internal Medicine

## 2023-01-31 ENCOUNTER — Ambulatory Visit: Payer: 59 | Admitting: Orthopedic Surgery

## 2023-01-31 VITALS — BP 120/88 | HR 70 | Temp 98.2°F | Ht 68.0 in | Wt 116.2 lb

## 2023-01-31 DIAGNOSIS — S14109D Unspecified injury at unspecified level of cervical spinal cord, subsequent encounter: Secondary | ICD-10-CM

## 2023-01-31 DIAGNOSIS — S14109A Unspecified injury at unspecified level of cervical spinal cord, initial encounter: Secondary | ICD-10-CM

## 2023-01-31 DIAGNOSIS — R252 Cramp and spasm: Secondary | ICD-10-CM

## 2023-01-31 DIAGNOSIS — Z7689 Persons encountering health services in other specified circumstances: Secondary | ICD-10-CM | POA: Diagnosis not present

## 2023-01-31 DIAGNOSIS — S14109S Unspecified injury at unspecified level of cervical spinal cord, sequela: Secondary | ICD-10-CM | POA: Insufficient documentation

## 2023-01-31 MED ORDER — DOXEPIN HCL 150 MG PO CAPS
150.0000 mg | ORAL_CAPSULE | Freq: Every day | ORAL | 1 refills | Status: DC
Start: 2023-01-31 — End: 2023-02-13
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
## 2023-01-31 NOTE — Patient Instructions (Addendum)
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  I'm referring to local spinal cord specialists.    Reviewed consensus artificial intelligence analysis of my theory: artificial intelligence  evaluation follows Cervical spinal cord injury (cSCI) can lead to various complications, including spasticity and laryngeal dysfunction. Spasticity is a condition characterized by involuntary muscle contractions, and the larynx, which is crucial for swallowing and breathing, can be affected by such neurological impairments.   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 MRI C spine, ultrasound neck, CT soft tissue neck, ENT, GI evaluation with EGD and manometry all negative. He has tried many kinds of meds but nothing helps. What treatments might be available that help with this.  Design Strategy  To address this complex case, we will leverage our comprehensive understanding of neurology, rehabilitation medicine, and the latest advancements in treatment modalities for spasticity management. Our strategy involves considering both conventional and innovative therapies that have shown efficacy in managing spasticity, especially in cases where standard pharmacological interventions have failed. We will explore multidisciplinary approaches, including physical therapy, interventional procedures, and emerging technologies, to devise a holistic plan tailored to the patient's specific needs and condition.  Execute Strategy  Given the patient's history and the  ineffectiveness of medications, our analysis suggests exploring the following treatment avenues:  1. Intrathecal Baclofen Therapy (ITB): This involves the delivery of baclofen directly to the spinal fluid, which can significantly reduce spasticity in patients unresponsive to oral medications. ITB is particularly effective for widespread spasticity and might offer relief for the neck, larynx, esophagus, and vocal cords.  2. Botulinum Toxin Injections: Targeted injections into the affected muscles can provide temporary relief from spasticity. This approach is precise and can be tailored to the specific muscles involved, potentially benefiting the neck and laryngeal muscles.  3. Neuromodulation Techniques: Techniques such as repetitive transcranial magnetic stimulation (rTMS) or spinal cord stimulation (SCS) have shown promise in managing spasticity in spinal cord injury patients. These non-invasive or minimally invasive techniques modulate neural activity, potentially reducing spasticity.  4. Physical Therapy and Rehabilitation: A specialized physical therapy program focusing on spasticity management, including stretching, strengthening, and motor control exercises, can be beneficial. This should be complemented by occupational therapy to maximize functional independence.  5. Surgical Interventions: In severe, refractory cases, surgical options such as selective dorsal rhizotomy (SDR) may be considered. SDR involves cutting nerve roots in the spinal cord that are contributing to spasticity, thereby reducing muscle stiffness.  Systematically Ensure Accuracy & Precision  Upon re-evaluation, each proposed treatment option addresses the patient's complex needs from a multidisciplinary perspective, considering the failure of conventional pharmacotherapy. ITB and botulinum toxin injections offer targeted relief with a strong evidence base for efficacy in spasticity. Neuromodulation techniques represent an  innovative approach with growing support in the literature. Physical therapy remains a cornerstone of spasticity management, emphasizing the importance of non-pharmacological interventions. Surgical interventions, while more invasive, provide an option for cases refractory to other treatments.  Final Answer  For a patient with cervical spinal cord injury experiencing severe and worsening spasticity unresponsive to medications, a comprehensive, multidisciplinary approach is recommended. This  includes considering Intrathecal Baclofen Therapy, targeted botulinum toxin injections, exploring neuromodulation techniques like rTMS or SCS, and reinforcing physical therapy and rehabilitation efforts. Surgical interventions may be considered as a last resort. Tailoring the treatment plan to the patient's specific condition and response to therapy is crucial, with close monitoring and adjustments as needed to optimize outcomes.  Key Insights Spasticity is significantly correlated with cervical incomplete injuries, and problematic spasticity is associated with extensor spasticity and limited range of motion (ROM)1. Laryngeal dysfunction, including asynchronous vocal fold movement and variability in glottal opening and closing, is a common symptom following cSCI, with acute alterations persisting chronically2. The incidence of spasticity is higher among individuals with cervical and upper thoracic injuries compared to lower thoracic and lumbosacral levels3. Speech impairments following cSCI often involve prosodic and phonatory disturbances, with common physical impairments in the respiratory and laryngeal subsystems4. Conclusion Cervical spinal cord injuries can indeed cause spasticity that affects the larynx, leading to laryngeal dysfunction and speech impairments. The severity and nature of these complications are influenced by the level and completeness of the injury, with cervical injuries showing a higher  incidence of spasticity and related laryngeal issues. These findings highlight the need for targeted therapeutic interventions to address laryngeal spasticity and improve the quality of life for individuals with cSCI.

## 2023-02-01 ENCOUNTER — Ambulatory Visit: Payer: 59

## 2023-02-01 ENCOUNTER — Encounter: Payer: Self-pay | Admitting: Internal Medicine

## 2023-02-01 ENCOUNTER — Emergency Department (HOSPITAL_COMMUNITY)
Admission: EM | Admit: 2023-02-01 | Discharge: 2023-02-01 | Disposition: A | Discharge status: Home / Self Care | Payer: Medicaid Other | Attending: Emergency Medicine | Admitting: Emergency Medicine

## 2023-02-01 ENCOUNTER — Other Ambulatory Visit: Payer: Self-pay

## 2023-02-01 ENCOUNTER — Encounter (HOSPITAL_COMMUNITY): Payer: Self-pay

## 2023-02-01 ENCOUNTER — Telehealth: Payer: Self-pay | Admitting: Internal Medicine

## 2023-02-01 ENCOUNTER — Emergency Department (HOSPITAL_COMMUNITY): Payer: Medicaid Other

## 2023-02-01 DIAGNOSIS — G4733 Obstructive sleep apnea (adult) (pediatric): Secondary | ICD-10-CM | POA: Diagnosis not present

## 2023-02-01 DIAGNOSIS — J9801 Acute bronchospasm: Secondary | ICD-10-CM | POA: Insufficient documentation

## 2023-02-01 DIAGNOSIS — G4719 Other hypersomnia: Secondary | ICD-10-CM

## 2023-02-01 DIAGNOSIS — Z91010 Allergy to peanuts: Secondary | ICD-10-CM | POA: Insufficient documentation

## 2023-02-01 DIAGNOSIS — R0602 Shortness of breath: Secondary | ICD-10-CM

## 2023-02-01 DIAGNOSIS — Z743 Need for continuous supervision: Secondary | ICD-10-CM | POA: Diagnosis not present

## 2023-02-01 DIAGNOSIS — R0683 Snoring: Secondary | ICD-10-CM

## 2023-02-01 DIAGNOSIS — G479 Sleep disorder, unspecified: Secondary | ICD-10-CM

## 2023-02-01 DIAGNOSIS — H5711 Ocular pain, right eye: Secondary | ICD-10-CM

## 2023-02-01 DIAGNOSIS — Z7689 Persons encountering health services in other specified circumstances: Secondary | ICD-10-CM | POA: Diagnosis not present

## 2023-02-01 MED ORDER — ALBUTEROL SULFATE HFA 108 (90 BASE) MCG/ACT IN AERS
2.0000 | INHALATION_SPRAY | Freq: Once | RESPIRATORY_TRACT | Status: AC
  Administered 2023-02-01: 2 via RESPIRATORY_TRACT
  Filled 2023-02-01: qty 6.7

## 2023-02-01 NOTE — Telephone Encounter (Signed)
Patient is requesting callback from PCP or PCP nurse. States he has some questions regarding his OV from 01/31/23. Pt can be contacted @ [phone].

## 2023-02-01 NOTE — Telephone Encounter (Signed)
I have messaged this patient via my chart since the time of this message.

## 2023-02-01 NOTE — Discharge Instructions (Signed)
Follow-up as instructed by your family doctor.  You can use the albuterol

## 2023-02-01 NOTE — Telephone Encounter (Signed)
Patient was seen for an OV yesterday.
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
## 2023-02-01 NOTE — ED Provider Notes (Signed)
[facility] AT [facility] Provider Note   CSN: [id number] Arrival date & time: 02/01/23  2023     History  Chief Complaint  Patient presents with   Shortness of Breath    Nicholas Johnson. is a 32 y.o. male.  Patient complains of some shortness of breath.  He has been evaluated by pulmonary and ENT for this.  There is some question of esophageal spasm causing some of the shortness of breath.  He is also out of his albuterol inhaler  The history is provided by the patient and medical records. No language interpreter was used.  Shortness of Breath Severity:  Mild Onset quality:  Sudden Timing:  Intermittent Progression:  Waxing and waning Chronicity:  Recurrent Context: activity   Relieved by:  None tried Worsened by:  Nothing Associated symptoms: no abdominal pain, no chest pain, no cough, no headaches and no rash        Home Medications Prior to Admission medications   Medication Sig Start Date End Date Taking? Authorizing Provider  albuterol (VENTOLIN HFA) 108 (90 Base) MCG/ACT inhaler Inhale 2 puffs into the lungs every 6 (six) hours as needed for wheezing or shortness of breath. 11/16/22   Willy Eddy, MD  baclofen (LIORESAL) 10 MG tablet Take 1 tablet (10 mg total) by mouth 3 (three) times daily. 01/22/23   [name], MD  cholecalciferol (VITAMIN D3) 25 MCG (1000 UNIT) tablet Take 1,000 Units by mouth daily.    [provider]  doxepin (SINEQUAN) 150 MG capsule Take 1 capsule (150 mg total) by mouth at bedtime. 01/31/23   [name], MD  DULoxetine (CYMBALTA) 30 MG capsule Take 1 capsule (30 mg total) by mouth daily. 01/12/23   [name], MD  sucralfate (CARAFATE) 1 GM/10ML suspension Take 1 g by mouth 3 (three) times daily with meals. 01/10/23   [provider]      Allergies    Corn-containing products, Malt, Soja bean oil [soybean oil], Peanut-containing drug products, and Wheat    Review of  Systems   Review of Systems  Constitutional:  Negative for appetite change and fatigue.  HENT:  Negative for congestion, ear discharge and sinus pressure.   Eyes:  Negative for discharge.  Respiratory:  Positive for shortness of breath. Negative for cough.   Cardiovascular:  Negative for chest pain.  Gastrointestinal:  Negative for abdominal pain and diarrhea.  Genitourinary:  Negative for frequency and hematuria.  Musculoskeletal:  Negative for back pain.  Skin:  Negative for rash.  Neurological:  Negative for seizures and headaches.  Psychiatric/Behavioral:  Negative for hallucinations.     Physical Exam Updated Vital Signs BP 116/75   Pulse 62   Temp 97.9 F (36.6 C) (Oral)   Resp 17   Ht 5\' 8"  (1.727 m)   Wt 52.6 kg   SpO2 100%   BMI 17.64 kg/m  Physical Exam Vitals and nursing note reviewed.  Constitutional:      Appearance: He is well-developed.  HENT:     Head: Normocephalic.     Nose: Nose normal.  Eyes:     General: No scleral icterus.    Conjunctiva/sclera: Conjunctivae normal.  Neck:     Thyroid: No thyromegaly.  Cardiovascular:     Rate and Rhythm: Normal rate and regular rhythm.     Heart sounds: No murmur heard.    No friction rub. No gallop.  Pulmonary:     Breath sounds:  No stridor. No wheezing or rales.  Chest:     Chest wall: No tenderness.  Abdominal:     General: There is no distension.     Tenderness: There is no abdominal tenderness. There is no rebound.  Musculoskeletal:        General: Normal range of motion.     Cervical back: Neck supple.  Lymphadenopathy:     Cervical: No cervical adenopathy.  Skin:    Findings: No erythema or rash.  Neurological:     Mental Status: He is alert and oriented to person, place, and time.     Motor: No abnormal muscle tone.     Coordination: Coordination normal.  Psychiatric:        Behavior: Behavior normal.     ED Results / Procedures / Treatments   Labs (all labs ordered are listed, but  only abnormal results are displayed) Labs Reviewed - No data to display  EKG None  Radiology DG Chest 2 View  Result Date: 02/01/2023 CLINICAL DATA:  Shortness of breath EXAM: CHEST - 2 VIEW COMPARISON:  01/23/2023 FINDINGS: The heart size and mediastinal contours are within normal limits. Both lungs are clear. The visualized skeletal structures are unremarkable. IMPRESSION: No active cardiopulmonary disease. Electronically Signed   By: Alcide CleverMark  Lukens M.D.   On: 02/01/2023 22:14    Procedures Procedures    Medications Ordered in ED Medications  albuterol (VENTOLIN HFA) 108 (90 Base) MCG/ACT inhaler 2 puff (has no administration in time range)    ED Course/ Medical Decision Making/ A&P                             Medical Decision Making Amount and/or Complexity of Data Reviewed Radiology: ordered.  Risk Prescription drug management.   Patient with shortness of breath.  He is sent home with albuterol inhaler to help with bronchospasm.  He has run out of his albuterol inhaler        Final Clinical Impression(s) / ED Diagnoses Final diagnoses:  Bronchospasm    Rx / DC Orders ED Discharge Orders     None         Bethann BerkshireZammit, Daesia Zylka, MD 02/02/23 1300

## 2023-02-02 ENCOUNTER — Other Ambulatory Visit: Payer: Self-pay

## 2023-02-02 DIAGNOSIS — R1310 Dysphagia, unspecified: Secondary | ICD-10-CM

## 2023-02-02 DIAGNOSIS — Z111 Encounter for screening for respiratory tuberculosis: Secondary | ICD-10-CM

## 2023-02-02 DIAGNOSIS — K219 Gastro-esophageal reflux disease without esophagitis: Secondary | ICD-10-CM

## 2023-02-02 DIAGNOSIS — R0989 Other specified symptoms and signs involving the circulatory and respiratory systems: Secondary | ICD-10-CM

## 2023-02-02 DIAGNOSIS — Z7689 Persons encountering health services in other specified circumstances: Secondary | ICD-10-CM | POA: Diagnosis not present

## 2023-02-02 NOTE — Telephone Encounter (Signed)
Patient requests to be called at ph# [phone]  has questions re: Referrals being placed.

## 2023-02-02 NOTE — Telephone Encounter (Signed)
Spoke with patient today concerning this.

## 2023-02-02 NOTE — Telephone Encounter (Signed)
Placed GI Referral to the requested facility.

## 2023-02-02 NOTE — Telephone Encounter (Signed)
Informed patient of this information. Both Dr. Jon Billings and I both spoke with patient today.

## 2023-02-03 DIAGNOSIS — F4323 Adjustment disorder with mixed anxiety and depressed mood: Secondary | ICD-10-CM | POA: Diagnosis not present

## 2023-02-05 ENCOUNTER — Ambulatory Visit: Payer: Medicaid Other | Admitting: Internal Medicine

## 2023-02-05 ENCOUNTER — Encounter: Payer: Self-pay | Admitting: Internal Medicine

## 2023-02-05 ENCOUNTER — Ambulatory Visit (INDEPENDENT_AMBULATORY_CARE_PROVIDER_SITE_OTHER): Payer: Medicaid Other | Admitting: Internal Medicine

## 2023-02-05 VITALS — BP 110/76 | HR 76 | Temp 98.1°F | Ht 68.0 in | Wt 117.2 lb

## 2023-02-05 DIAGNOSIS — R252 Cramp and spasm: Secondary | ICD-10-CM | POA: Diagnosis not present

## 2023-02-05 DIAGNOSIS — G959 Disease of spinal cord, unspecified: Secondary | ICD-10-CM | POA: Diagnosis not present

## 2023-02-05 DIAGNOSIS — S14109D Unspecified injury at unspecified level of cervical spinal cord, subsequent encounter: Secondary | ICD-10-CM | POA: Diagnosis not present

## 2023-02-05 DIAGNOSIS — S199XXS Unspecified injury of neck, sequela: Secondary | ICD-10-CM | POA: Diagnosis not present

## 2023-02-05 DIAGNOSIS — Z7689 Persons encountering health services in other specified circumstances: Secondary | ICD-10-CM | POA: Diagnosis not present
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
## 2023-02-05 NOTE — Assessment & Plan Note (Addendum)
Mr. 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  I explained to that the most valuable appointments will be ones with spinal cord injury specialist and the rest are much less valuable.  While I want him to do all of the appointments that have been set up it is his choice ultimately as he is an intelligent man and he can choose where he thinks his time is best spent.  I believe the people who can help the symptoms that he is experiencing the most are people that can actually do the interventional procedures that I listed on the after visit summary from her last visit including intrathecal baclofen and Botox injections.  Regarding the upcoming GI referral to [facility] I do not think they are going to have any  intervention but might be able to repeat manometry attempt (prior attempt not tolerated) and they are more likely to have the ability to confirm what I have been speculating is the cause of his symptoms which is the spinal cord injury which is yet to confirmed but I do not think is a conformable diagnosis anyway and so I do not recommend repeating an MRI so that's 1 less appointment.  He has neurology lined up with [facility]y and [facility] and we looked into these options together to see which 1 would be the best use of his time.  He had a initial preference for [facility] to it so much closer- I encouraged him to call them both and go to the 1 with more ability to help cervical spinal cord injury  I tried to look online to see if we could find anything about Dr. Antonietta Barcelona at [facility] if he has experience and spinal cord injury or interventional procedures for spasticity from spinal cord injury but there was nothing available online so I asked him to call himself to their office and see if he has that to help him make a determination if it is worth his time  to go.  Mainly I am hoping that he has skillet intrathecal injections of baclofen or botulinum.  He he expressed some hesitancy about the potential risks of these procedures and I encouraged him to talk them over with the neurologist but I explained that they are generally very low risk.  For that reason I also encouraged him to call ahead to the [facility] neurologist and see if they do the interventional spinal procedures.  Finally I explained to him that if neither do interventional spine procedures then it may not be worth his time to talk to them because I think all of the medications that can be tried have been tried.  Nonetheless I am not a specialist and this is a spasticity and neurological problem and so with all that in mind I still recommend seeing them even if they do not do interventional but the value is less admitted.  Patient reports  prior neurosurgeons have advised patient to see neurology about it.  In my medical opinion,  gastroenterology is not going to be able to help as much as neurologically oriented providers, and so with it being far away at [facility] I think that is one of the more low hanging fruit for a visit to cancel if it is hard for him to do with his job. He has been referred to neuropsychology  for by [facility] neurology for I am not totally clear on why but I do not have no  reason personally to recommend he complete it  He does report having a nerve block in the past by a pain specialist but pain is not a major issue these days.  Nonetheless to a pain specialist might be most able to inject some of these medications and I put it into his mind that we might end up going that direction ultimately if we can find a spinal cord injury specialist within range that can do it. Pain isn't the problem now, just spasticity.  Points out that ear nose and throat do not feel it is a structural issue I agree based upon the [facility] imaging that is never shown any structural issues and ENT  evaluation with endoscopy that did not see any structural issues.  I believe it is a functional issue and specifically a functional dysfunction of the esophagus and vocal cords due to apparent nerve functioning that is occurring as a result of spinal cord injury at the cervical level. In other words, in my medical opinion, further ENT follow up is unlikely to be of benefit  I sent a chat through epic EHR (electronic health record) to Willia Craze spine specialist to get his opinion on whether follow up with him would still be helpful now that MRI cervial spine returned negative.  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

## 2023-02-05 NOTE — Patient Instructions (Signed)
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  I explained to that the most valuable appointments will be ones with spinal cord injury specialist and the rest are much less valuable.  While I want him to do all of the appointments that have been set up it is his choice ultimately as he is an intelligent man and he can choose where he thinks his time is best spent.  I believe the people who can help the symptoms that he is experiencing the most are people that can actually do the interventional procedures that I listed on the after visit summary from her last visit including intrathecal baclofen and Botox injections.  Regarding the upcoming GI referral to [facility] I do not think they are going to have an intervention and they are more likely to have the ability to confirm what I have been speculating is the cause of his symptoms which is the spinal cord injury which is yet to confirmed but I do not think is a conformable diagnosis anyway and so I do not recommend repeating an MRI so that's less appointment.  He has neurology lined up with [facility]y and [facility] and we looked into these options together to see which 1 would be the best use of his time.  He had a initial preference for go for due to it so much closer  I tried to look online to see if we could find anything about Dr. Antonietta Barcelona at [facility] if he has experience and spinal cord injury or interventional procedures for spasticity from spinal cord injury but there was nothing available online so I asked him to call himself to their office and see if he has that to help him make a determination if it is worth his time to go.  Mainly I am hoping that he has skillet intrathecal injections of  baclofen or botulinum.  He he expressed some hesitancy about the potential risks of these procedures and I encouraged him to talk them over with the neurologist but I explained that they are generally very low risk.  For that reason I also encouraged him to call ahead to the [facility] neurologist and see if they do the interventional spinal procedures.  Finally I explained to him that if neither do interventional spine procedures then it may not be worth his time to talk to them because I think all of the medications that can be tried have been tried.  Nonetheless I am not a specialist and this is a spasticity and neurological problem and so with all that in mind I still recommend seeing them even if they do not do interventional but the value is less admitted. My personal opinion is that gastroenterology is not going to be able to help and so with it being far away at [facility] I think that is one of the more low hanging fruit for a visit to cancel if it is hard for him to do with his job. He has been referred to neuropsychology for by [facility] neurology for I am not totally clear on why but I do not have a reason personally to recommend he complete it  He does report having a nerve block in the past  by a pain specialist but pain is not a major issue these days.  Nonetheless to a pain specialist might be most able to inject some of these medications and I put it into his mind that we might end up going that direction ultimately if we can find a spinal cord injury specialist within range that can do it  Points out that ear nose and throat do not feel it is a structural issue I agree based upon the [facility] imaging that is never shown any structural issues and ENT evaluation with endoscopy that did not see any structural issues.  I believe it is a functional issue and specifically a functional dysfunction of the esophagus and vocal cords due to apparent nerve functioning that is occurring as a result of spinal  cord injury at the cervical level

## 2023-02-06 NOTE — Assessment & Plan Note (Signed)
Patient continues to complain of issues related to throat pain, swallowing difficulties, inability to eat due to these.  Evaluation thus far has included ENT, gastroenterology, pulmonology.  Thus far, testing has largely been within normal limits including normal esophagram, PFTs, echocardiogram, CT chest with contrast, thyroid ultrasound, CT soft tissue neck, barium swallow.  Despite this, patient continues to report notable issues with throat pain, swallowing difficulty, decreased p.o. intake.  He does have upcoming procedural visit with gastroenterology. On exam, patient is in no acute distress, vital signs stable.  Cardiovascular exam with regular rate and rhythm, lungs clear to auscultation bilaterally. We discussed considerations today, recommend that he continue with specialist evaluation including upcoming procedure with GI.  Additionally, we discussed the mental health) pertaining to ongoing symptoms, both the potential for underlying mood/psychiatric concerns contributing to symptoms as well as current symptoms present mood issues.  In discussing this, discussed potential treatment considerations including counseling/therapy as well as possibly meeting with a psychiatrist.  He would be interested in meeting with a psychiatrist, referral placed

## 2023-02-11 DIAGNOSIS — F4323 Adjustment disorder with mixed anxiety and depressed mood: Secondary | ICD-10-CM | POA: Diagnosis not present

## 2023-02-13 ENCOUNTER — Telehealth (INDEPENDENT_AMBULATORY_CARE_PROVIDER_SITE_OTHER): Payer: Medicaid Other | Admitting: Internal Medicine

## 2023-02-13 ENCOUNTER — Other Ambulatory Visit: Payer: Self-pay | Admitting: Internal Medicine

## 2023-02-13 ENCOUNTER — Ambulatory Visit: Payer: Medicaid Other | Admitting: Internal Medicine

## 2023-02-13 ENCOUNTER — Encounter: Payer: Self-pay | Admitting: Internal Medicine

## 2023-02-13 DIAGNOSIS — R252 Cramp and spasm: Secondary | ICD-10-CM
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
## 2023-02-13 NOTE — Assessment & Plan Note (Addendum)
Reviewed referral with patient and referral coordinator - and asked referral coordinator to reach out to patient and update neurology referral to [facility].  Also update him on the physical medicine and rehabilitation referral.  Patient is to call [facility] ENT Dr. Delford Field on his own. Asked patient also to see Dr. Delford Field, ENT at [facility] and meet with their speech therapy again While I appreciate Dr. 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 I therefore am focused on getting neurology, physical medicine and rehabilitation, and ENT and speech support for him for now, and he has gastrointestinal referral to [facility] all set now.

## 2023-02-15 ENCOUNTER — Ambulatory Visit: Payer: 59 | Admitting: Internal Medicine

## 2023-02-15 NOTE — Telephone Encounter (Signed)
I have already spoke with patient previously concerning referrals.

## 2023-02-20 ENCOUNTER — Telehealth: Payer: 59 | Admitting: Internal Medicine

## 2023-02-20 ENCOUNTER — Telehealth: Payer: Self-pay | Admitting: Internal Medicine

## 2023-02-20 ENCOUNTER — Ambulatory Visit: Payer: 59 | Admitting: Internal Medicine

## 2023-02-20 DIAGNOSIS — Z7689 Persons encountering health services in other specified circumstances: Secondary | ICD-10-CM | POA: Diagnosis not present

## 2023-02-20 NOTE — Telephone Encounter (Signed)
Patient called to inform the nurse that he will be bringing the sleep study machine in today around 2:30.

## 2023-02-20 NOTE — Telephone Encounter (Signed)
Johny Drilling just an [place]?

## 2023-02-20 NOTE — Telephone Encounter (Signed)
Thank you :)

## 2023-02-21 ENCOUNTER — Ambulatory Visit: Payer: 59 | Admitting: Internal Medicine

## 2023-02-21 DIAGNOSIS — Z419 Encounter for procedure for purposes other than remedying health state, unspecified: Secondary | ICD-10-CM | POA: Diagnosis not present

## 2023-02-23 ENCOUNTER — Encounter: Payer: Self-pay | Admitting: Internal Medicine

## 2023-02-23 ENCOUNTER — Ambulatory Visit (INDEPENDENT_AMBULATORY_CARE_PROVIDER_SITE_OTHER): Payer: 59 | Admitting: Internal Medicine

## 2023-02-23 VITALS — BP 110/70 | HR 70 | Temp 98.0°F | Ht 68.0 in | Wt 120.0 lb

## 2023-02-23 DIAGNOSIS — F39 Unspecified mood [affective] disorder: Secondary | ICD-10-CM

## 2023-02-23 DIAGNOSIS — Z7689 Persons encountering health services in other specified circumstances: Secondary | ICD-10-CM | POA: Diagnosis not present

## 2023-02-23 DIAGNOSIS — F451 Undifferentiated somatoform disorder: Secondary | ICD-10-CM

## 2023-02-23 DIAGNOSIS — R0609 Other forms of dyspnea: Secondary | ICD-10-CM

## 2023-02-23 DIAGNOSIS — R252 Cramp and spasm: Secondary | ICD-10-CM

## 2023-02-23 DIAGNOSIS — E559 Vitamin D deficiency, unspecified: Secondary | ICD-10-CM

## 2023-02-23 DIAGNOSIS — R1314 Dysphagia, pharyngoesophageal phase: Secondary | ICD-10-CM | POA: Diagnosis not present

## 2023-02-23 MED ORDER — CALCIUM/VITAMIN D3/ADULT GUMMY 250-100-500 MG-MG-UNIT PO CHEW
2.0000 | CHEWABLE_TABLET | Freq: Every day | ORAL | 3 refills | Status: DC
Start: 2023-02-23 — End: 2023-10-10

## 2023-02-23 NOTE — Assessment & Plan Note (Signed)
  Patient reports things are pretty much the same.  My impression is that he might be a little better that his symptoms were extremely flared in previous visits due to esophageal manometry and endoscopy. 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 Christell Constant spinal surgeon felt that the cord signal on the MRI was good and felt that cervical interventions are not going to be appropriate but that ear nose and throat should consider baclofen injections into the front of the throat and he should follow-up with neurology so return to make sure we get that done today some of the referrals have been challenging to make An The GI referral was escalated from his previous GI doctor to [facility] and that scheduled for the end of the month The referral was reviewed with the patient and we did have difficulty getting him to any neurologist in [place] he was declined at multiple places so that was part of the delay but we did send it to [facility] neuro on April 24 and he just did not see the MyChart message or remember it so I gave him the number to call to schedule it again He is still having eye problems so we went over the referral that was placed April 11 to [facility] eye care and he took a picture of the number to remind himself to make that appointment Regarding the [facility] physical therapy referral for swallowing and other spasticity related GI dysfunction he wants to hold off and so work leaving the [employer] referral closed although he was accepted and the  reason for this is that it is very time-consuming and he is got a job right now and he is feels like the benefits are uncertain Regarding ENT - [facility] but he will and I advised him to discuss baclofen or botox injection(s) in the neck

## 2023-02-25 DIAGNOSIS — F451 Undifferentiated somatoform disorder: Secondary | ICD-10-CM | POA: Insufficient documentation
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
## 2023-02-25 NOTE — Assessment & Plan Note (Addendum)
Today I focused on going through all of his referrals for various specialist to try to make sure we are logistically set up to see everyone that might be able to help as the symptoms continue to bother him severely  I have happy to continue close follow-up to reassure him I do not think the symptoms are dangerous although he is slowly losing weight which could be a problem in the future but he is taking boost and I encouraged him to take ratio yogurt  I hope to also find a specialist in ear nose and throat and GI they are comfortable following him closely even though it can be challenging to get improvement in the symptoms.  He is reluctant to try any invasive procedure like injectable muscle relaxers due to the potential to make things worse so I encouraged him to just keep talking with people about risks and benefits of various treatment options  I like to try to get him taking a tricyclic antidepressant and doxepin has been prescribed but I do not think he is giving it a really good chance yet

## 2023-02-25 NOTE — Assessment & Plan Note (Signed)
I have happy to continue close follow-up to reassure him I do not think the symptoms are dangerous although he is slowly losing weight which could be a problem in the future but he is taking boost and I encouraged him to take ratio yogurt

## 2023-02-27 ENCOUNTER — Ambulatory Visit (HOSPITAL_COMMUNITY): Payer: 59

## 2023-02-28 ENCOUNTER — Telehealth: Payer: Self-pay | Admitting: Internal Medicine

## 2023-02-28 ENCOUNTER — Ambulatory Visit: Payer: 59 | Admitting: Internal Medicine

## 2023-02-28 NOTE — Telephone Encounter (Signed)
Pt would like a call back with concerns of the referral. Please advise.

## 2023-03-01 NOTE — Telephone Encounter (Signed)
LVM for pt to cb to the office to address his concerns

## 2023-03-05 ENCOUNTER — Encounter: Payer: Self-pay | Admitting: Internal Medicine

## 2023-03-05 ENCOUNTER — Ambulatory Visit (INDEPENDENT_AMBULATORY_CARE_PROVIDER_SITE_OTHER): Payer: Medicaid Other | Admitting: Internal Medicine

## 2023-03-05 VITALS — BP 120/68 | HR 69 | Temp 98.1°F | Ht 68.0 in | Wt 123.8 lb

## 2023-03-05 DIAGNOSIS — R252 Cramp and spasm: Secondary | ICD-10-CM | POA: Diagnosis not present

## 2023-03-05 DIAGNOSIS — H5310 Unspecified subjective visual disturbances: Secondary | ICD-10-CM

## 2023-03-05 DIAGNOSIS — R5383 Other fatigue: Secondary | ICD-10-CM | POA: Diagnosis not present

## 2023-03-05 DIAGNOSIS — E639 Nutritional deficiency, unspecified: Secondary | ICD-10-CM

## 2023-03-05 DIAGNOSIS — E559 Vitamin D deficiency, unspecified: Secondary | ICD-10-CM

## 2023-03-05 DIAGNOSIS — Z7689 Persons encountering health services in other specified circumstances: Secondary | ICD-10-CM | POA: Diagnosis not present

## 2023-03-05 HISTORY — DX: Unspecified subjective visual disturbances: H53.10

## 2023-03-05 LAB — B12 AND FOLATE PANEL
Folate: >23.9 ng/mL (ref >5.9)
Vitamin B-12: 533 pg/mL (ref 211–911)

## 2023-03-05 LAB — BASIC METABOLIC PANEL
BUN: 10 mg/dL (ref 6–23)
CO2: 30 mEq/L (ref 19–32)
Calcium: 9.9 mg/dL (ref 8.4–10.5)
Chloride: 101 mEq/L (ref 96–112)
Creatinine, Ser: 0.72 mg/dL (ref 0.40–1.50)
GFR: 122.22 mL/min (ref >60.00)
Glucose, Bld: 74 mg/dL (ref 70–99)
Potassium: 4.5 mEq/L (ref 3.5–5.1)
Sodium: 140 mEq/L (ref 135–145)

## 2023-03-05 LAB — CBC WITH DIFFERENTIAL/PLATELET
Basophils Absolute: 0 10*3/uL (ref 0.0–0.1)
Basophils Relative: 1 % (ref 0.0–3.0)
Eosinophils Absolute: 0 10*3/uL (ref 0.0–0.7)
Eosinophils Relative: 1 % (ref 0.0–5.0)
HCT: 41.5 % (ref 39.0–52.0)
Hemoglobin: 14.1 g/dL (ref 13.0–17.0)
Lymphocytes Relative: 31.6 % (ref 12.0–46.0)
Lymphs Abs: 1.3 10*3/uL (ref 0.7–4.0)
MCHC: 34 g/dL (ref 30.0–36.0)
MCV: 85.6 fl (ref 78.0–100.0)
Monocytes Absolute: 0.4 10*3/uL (ref 0.1–1.0)
Monocytes Relative: 10.7 % (ref 3.0–12.0)
Neutro Abs: 2.3 10*3/uL (ref 1.4–7.7)
Neutrophils Relative %: 55.7 % (ref 43.0–77.0)
Platelets: 190 10*3/uL (ref 150.0–400.0)
RBC: 4.84 Mil/uL (ref 4.22–5.81)
RDW: 13.3 % (ref 11.5–15.5)
WBC: 4.1 10*3/uL (ref 4.0–10.5)

## 2023-03-05 LAB — TSH: TSH: 0.98 u[IU]/mL (ref 0.35–5.50)

## 2023-03-05 LAB — MAGNESIUM: Magnesium: 1.9 mg/dL (ref 1.5–2.5)

## 2023-03-05 LAB — FERRITIN: Ferritin: 35.8 ng/mL (ref 22.0–322.0)

## 2023-03-05 NOTE — Assessment & Plan Note (Signed)
Encourage patient to keep drinking protein shakes and monitoring himself for weight gain and try to eat balanced diet and since he cannot eat anything but soft foods he needs to take multivitamin Gummies Will check a lab panel to see how he is doing but his weight is looking good

## 2023-03-05 NOTE — Assessment & Plan Note (Signed)
To call his pulmonologist nurse practitioner [name] about the home sleep study test results Will check up all of the labs which we agreed on with shared decision making

## 2023-03-05 NOTE — Assessment & Plan Note (Addendum)
He also wants to follow-up with regards to his referrals which have been challenging.  He reports that the [facility] neurology referral is not going to be for another 5 months or so due to they are booked up.  He reports that the GI referral went to [facility] and he wants it with [facility] but that he is already sorted that out with the referral coordinator and they have redirected it as of this morning.  With regards to the referrals that he was going to call with our last visit he reports he has yet to call  Unchanged per patient report but it still feels super tight and swallowing/constriction harder, breathing challenges about the same Reminded him today he needs to call Dr. Florene Route office ENT:  consider baclofen injections into the front of the throat and he should follow-up with neurology so return to make sure we get that done today some of the referrals have been challenging to make An The GI referral was escalated from his previous GI doctor to [facility] and that scheduled for the end of the month but it seems to have gotten cancelled, I offered to call to get it rescheduled if necessary- he will work with referral coordinator  He has now called  [facility] neuro on April 24 and he just did not see the MyChart message or remember it so I gave him the number to call to schedule it againRegarding the [facility] physical [facility] referral for swallowing and other spasticity related GI dysfunction he wants to hold off and so work leaving the [facility] referral closed although he was accepted and the reason for this is that it is very time-consuming and he is got a job right now and he is feels like the benefits are uncertain Regarding ENT - [facility]'t [facility] but he will and I advised him to discuss baclofen or botox injection(s) in the neck
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
## 2023-03-05 NOTE — Assessment & Plan Note (Signed)
I explained that is a little too soon to recheck the vitamin D and just encouraged him to be consistent with taking the gummy I also explained that this could still be contributing to the fatigue he is feeling but I think it is multifactorial

## 2023-03-06 DIAGNOSIS — F4323 Adjustment disorder with mixed anxiety and depressed mood: Secondary | ICD-10-CM | POA: Diagnosis not present

## 2023-03-06 NOTE — Telephone Encounter (Signed)
Patient was here for an OV concerning this on 5/13.

## 2023-03-07 DIAGNOSIS — G4733 Obstructive sleep apnea (adult) (pediatric): Secondary | ICD-10-CM | POA: Diagnosis not present

## 2023-03-07 NOTE — Telephone Encounter (Signed)
Spoke with patient concerning this. 

## 2023-03-09 ENCOUNTER — Telehealth: Payer: Self-pay | Admitting: Internal Medicine

## 2023-03-09 LAB — METHYLMALONIC ACID, SERUM: Methylmalonic Acid, Quant: 83 nmol/L — ABNORMAL LOW (ref 87–318)

## 2023-03-09 NOTE — Telephone Encounter (Signed)
error 

## 2023-03-13 ENCOUNTER — Encounter: Payer: Self-pay | Admitting: Internal Medicine

## 2023-03-13 ENCOUNTER — Ambulatory Visit: Payer: Medicaid Other | Admitting: Internal Medicine

## 2023-03-13 VITALS — BP 110/68 | HR 70 | Temp 98.2°F | Ht 68.0 in | Wt 123.0 lb

## 2023-03-13 DIAGNOSIS — R252 Cramp and spasm: Secondary | ICD-10-CM | POA: Diagnosis not present

## 2023-03-13 DIAGNOSIS — F4323 Adjustment disorder with mixed anxiety and depressed mood: Secondary | ICD-10-CM | POA: Diagnosis not present

## 2023-03-13 DIAGNOSIS — Z7689 Persons encountering health services in other specified circumstances: Secondary | ICD-10-CM | POA: Diagnosis not present

## 2023-03-13 DIAGNOSIS — G4733 Obstructive sleep apnea (adult) (pediatric): Secondary | ICD-10-CM | POA: Diagnosis not present

## 2023-03-13 DIAGNOSIS — R5383 Other fatigue: Secondary | ICD-10-CM | POA: Diagnosis not present

## 2023-03-13 DIAGNOSIS — G44321 Chronic post-traumatic headache, intractable: Secondary | ICD-10-CM

## 2023-03-13 NOTE — Assessment & Plan Note (Signed)
Not taking calcium vitamin D supplement Negative labwork recently done for this Encouraged patient to to take calcium vitamin D and multivitamin due to can't only tolerate soft foods.

## 2023-03-13 NOTE — Assessment & Plan Note (Signed)
>>  ASSESSMENT AND PLAN FOR POST-TRAUMATIC HEADACHE, NOT INTRACTABLE WRITTEN ON 03/13/2023  8:44 AM BY Simmie Camerer G, MD  These are severe and bothering him a lot lately I offered to send in migraine medications and I think there is a good chance that some migraine I also offered and recommended he check with ophthalmology to make sure it is not glaucoma due to it being in the eye and not having the tools here to check the eye pressure.  I do not think there is any indication for MRI at this time although I am going to try to get an MRI brain if he is interested also very difficult for him to lie flat for the MRI look around and see if there is an upright MRI that can get him approved it is really severe and persistent and did not respond to prior migraine meds and he rather not take them again

## 2023-03-13 NOTE — Assessment & Plan Note (Addendum)
He is yet to call Dr. Delford Field but he agreed again today to call and see about speech therapy and baclofen injection(s). Requests neurology referral [facility] due to has to wait to December for [facility] and declined at local neurologists.

## 2023-03-13 NOTE — Patient Instructions (Signed)
It was a pleasure seeing you today! Your health and satisfaction are our top priorities.   Glenetta Hew, MD  Next Steps: Answer the referral to [facility] neurology, do not worry about the GI referral, phone call [facility] and schedule, phone call Dr. Delford Field ear nose and throat at [facility] and schedule an appointment, take vitamin D with calcium supplement and a multivitamin daily, make sure to follow-up with Dr. [name] about getting the sleep apnea treated  [x]  Early Intervention: Schedule sooner appointment, call our on-call services, or go to emergency room if there is Increase in pain or discomfort New or worsening symptoms Sudden or severe changes in your health [x]  Flexible Follow-Up: We recommend a No follow-ups on file. for optimal routine care. This allows for progress monitoring and treatment adjustments. [x]  Preventive Care: Schedule your annual preventive care visit! It's typically covered by insurance and helps identify potential health issues early. [x]  Lab & X-ray Appointments: Incomplete tests scheduled today, or call to schedule. X-rays: [facility] (M-F, 8:30am-noon or 1pm-5pm). [x]  Medical Information Release: Sign a release form at front desk to obtain relevant medical information we don't have.  Making the Most of Our Focused (20 minute) Appointments:  [x]   Clearly state your top concerns at the beginning of the visit to focus our discussion [x]   If you anticipate you will need more time, please inform the front desk during scheduling - we can book multiple appointments in the same week. [x]   If you have transportation problems- use our convenient video appointments or ask about transportation support. [x]   We can get down to business faster if you use MyChart to update information before the visit and submit non-urgent questions before your visit. Thank you for taking the time to provide details through MyChart.  Let our nurse know and she can import  this information into your encounter documents.  Arrival and Wait Times: [x]   Arriving on time ensures that everyone receives prompt attention. [x]   Early morning (8a) and afternoon (1p) appointments tend to have shortest wait times. [x]   Unfortunately, we cannot delay appointments for late arrivals or hold slots during phone calls.  Getting Answers and Following Up  [x]   Simple Questions & Concerns: For quick questions or basic follow-up after your visit, reach [place] at [phone] or MyChart messaging. [x]   Complex Concerns: If your concern is more complex, scheduling an appointment might be best. Discuss this with the staff to find the most suitable option. [x]   Lab & Imaging Results: We'll contact you directly if results are abnormal or you don't use MyChart. Most normal results will be on MyChart within 2-3 business days, with a review message from Dr. Jon Billings. Haven't heard back in 2 weeks? Need results sooner? Contact us at [phone]. [x]   Referrals: Our referral coordinator will manage specialist referrals. The specialist's office should contact you within 2 weeks to schedule an appointment. Call us if you haven't heard from them after 2 weeks.  Staying Connected  [x]   MyChart: Activate your MyChart for the fastest way to access results and message [place]. See the last page of this paperwork for instructions on how to activate.  Bring to Your Next Appointment  [x]   Medications: Please bring all your medication bottles to your next appointment to ensure we have an accurate record of your prescriptions. [x]   Health Diaries: If you're monitoring any health conditions at home, keeping a diary of your readings can be very helpful  for discussions at your next appointment.  Billing  [x]   X-ray & Lab Orders: These are billed by separate companies. Contact the invoicing company directly for questions or concerns. [x]   Visit Charges: Discuss any billing inquiries with our administrative  services team.  Your Satisfaction Matters  [x]   Share Your Experience: We strive for your satisfaction! If you have any complaints, or preferably compliments, please let Dr. Jon Billings know directly or contact our Practice Administrators, Edwena Felty or [employer], by asking at the front desk.   Reviewing Your Records  [x]   Review this early draft of your clinical encounter notes below and the final encounter summary tomorrow on MyChart after its been completed.   Spasticity Assessment & Plan: He is yet to call Dr. Delford Field but he agreed again today to call and see about speech therapy and baclofen injection(s).   Intractable chronic post-traumatic headache Assessment & Plan: These are severe and bothering him a lot lately I offered to send in migraine medications and I think there is a good chance that some migraine I also offered and recommended he check with ophthalmology to make sure it is not glaucoma due to it being in the eye and not having the tools here to check the eye pressure.  I do not think there is any indication for MRI at this time although I am going to try to get an MRI brain if he is interested also very difficult for him to lie flat for the MRI look around and see if there is an upright MRI that can get him approved it is really severe and persistent and did not respond to prior migraine meds and he rather not take them again   OSA (obstructive sleep apnea)

## 2023-03-13 NOTE — Assessment & Plan Note (Signed)
These are severe and bothering him a lot lately I offered to send in migraine medications and I think there is a good chance that some migraine I also offered and recommended he check with ophthalmology to make sure it is not glaucoma due to it being in the eye and not having the tools here to check the eye pressure.  I do not think there is any indication for MRI at this time although I am going to try to get an MRI brain if he is interested also very difficult for him to lie flat for the MRI look around and see if there is an upright MRI that can get him approved it is really severe and persistent and did not respond to prior migraine meds and he rather not take them again
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
## 2023-03-15 ENCOUNTER — Telehealth: Payer: Self-pay | Admitting: Internal Medicine

## 2023-03-15 NOTE — Telephone Encounter (Signed)
Patient called for contact information to neurologist that pcp referred him to following OV on 5/21. Pt was given the number for Dr. Harrold Donath Tagg that is in the referral messages. Pt called back stating he heard on the line that it was to [facility] not neurology. I informed patient that Dr. Renaldo Reel appears to be a neuro--ophthalmologist but pt is still unclear as to why he was referred to that specialist. Pt requests call back for clarification.

## 2023-03-20 ENCOUNTER — Encounter: Payer: Self-pay | Admitting: Internal Medicine

## 2023-03-22 ENCOUNTER — Encounter: Payer: Self-pay | Admitting: Nurse Practitioner

## 2023-03-22 ENCOUNTER — Ambulatory Visit: Payer: Medicaid Other | Admitting: Nurse Practitioner

## 2023-03-22 VITALS — BP 124/68 | HR 68 | Temp 98.4°F | Ht 68.0 in | Wt 127.2 lb

## 2023-03-22 DIAGNOSIS — G4730 Sleep apnea, unspecified: Secondary | ICD-10-CM | POA: Diagnosis not present

## 2023-03-22 DIAGNOSIS — G4733 Obstructive sleep apnea (adult) (pediatric): Secondary | ICD-10-CM | POA: Insufficient documentation

## 2023-03-22 DIAGNOSIS — Z7689 Persons encountering health services in other specified circumstances: Secondary | ICD-10-CM | POA: Diagnosis not present

## 2023-03-22 NOTE — Patient Instructions (Addendum)
Start CPAP 5-15 cmH2O every night, minimum of 4-6 hours a night.  Change equipment every 30 days or as directed by DME. Wash your tubing with warm soap and water daily, hang to dry. Wash humidifier portion weekly. Use bottled distilled water for your water chamber and change daily.  Be aware of reduced alertness and do not drive or operate heavy machinery if experiencing this or drowsiness.   We discussed how untreated sleep apnea puts an individual at risk for cardiac arrhthymias, pulm HTN, DM, stroke and increases their risk for daytime accidents; although, these are minimal with mild severity. We also briefly reviewed treatment options including weight loss, side sleeping position, oral appliance, CPAP therapy  Follow up in 8-12 weeks with Dr. Wynona Neat or Florentina Addison Jayme Mednick,NP to see how CPAP therapy is going, or sooner, if needed

## 2023-03-22 NOTE — Assessment & Plan Note (Signed)
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 We reviewed risks of untreated OSA; although, these are minimal with his very mild severity. We did discuss that this could be contributing to his insufficient sleep and daytime fatigue. Reviewed potential treatment options. He would like to move forward with CPAP therapy. Educated on proper use/care of device. Risks/benefits reviewed. Cautioned on safe driving practices.  Patient Instructions  Start CPAP 5-15 cmH2O every night, minimum of 4-6 hours a night.  Change equipment every 30 days or as directed by DME. Wash your tubing with warm soap and water daily, hang to dry. Wash humidifier portion weekly. Use bottled distilled water for your water chamber and change daily.  Be aware of reduced alertness and do not drive or operate heavy machinery if experiencing this or drowsiness.   We discussed how untreated sleep apnea puts an individual at risk for cardiac arrhthymias, pulm HTN, DM, stroke and increases their risk for daytime accidents; although, these are minimal with mild severity. We also briefly reviewed treatment options including weight loss, side sleeping position, oral appliance, CPAP therapy  Follow up in 8-12 weeks with Dr. Wynona Neat or Florentina Addison Margaruite Top,NP to see how CPAP therapy is going, or sooner, if needed

## 2023-03-22 NOTE — Telephone Encounter (Signed)
Dr. Jon Billings spoke with pt through Mychart.

## 2023-03-22 NOTE — Progress Notes (Signed)
@Patient  ID: [name], male    DOB: 15-Mar-1991, 31 y.o.   MRN: [id number]  Chief Complaint  Patient presents with   Follow-up    Hst results.    Referring provider: [name], MD  HPI: 32 year old male, never smoker followed for DOE and mild OSA.  He is a patient of Dr. [name] and last seen in office 01/08/2023. Past medical history significant for migraine headaches, psychosis, dysphagia.  TEST/EVENTS:  01/29/2021 CT maxillofacial: normal  01/29/2021 CT chest: lungs are clear. No LAD 11/12/2022 CT neck soft tissue: normal CT 11/08/2022 barium swallow: normal 11/19/2022 CXR: hyperinflation of the lungs, unchanged. Otherwise, clear and without acute process.  11/16/2022 PFT: FVC 93, FEV1 92, ratio 81 02/02/2023 HST: AHI 7.4/h, SpO2 low 85%  11/13/2022: OV with Dr. [name] for hospital follow-up after ED visits on 1/15, 1/17 and 1/20.  Complaints of trouble swallowing, having a sensation of something stuck in his throat for the past year.  Seen in the ED, by his PCP, ENT and GI in the last month for the same.  ENT did a laryngoscopy which was normal.  He had a barium swallow that was normal.  His CT of his neck was also normal.  Chest x-ray without any active disease.  Awaiting MRI of the brain and thyroid ultrasound.  Does have some sensation of difficulty getting air to move through back of nasopharynx and throat.  No history of asthma or lung disease.  Complains of mainly upper airway and felt to be nonallopathic.  Trial of Trelegy for dyspnea.  PFTs ordered for further evaluation.  Advised on measures to soothe the throat.  11/20/2022: OV with Salbador Fiveash NP for follow-up after pulmonary function testing.  Unfortunately, he was only able to complete the spirometry portion of the testing.  Lung function was normal from this.  Since he was here last, he has been seen in the ED numerous times, most recently yesterday 1/28.  He was having some reports of intermittent chest pain, which she  described as indigestion.  Workup was unrevealing with negative troponins and clear chest x-ray.  He was prescribed Carafate instructed to keep his appointment with GI.  Today, he tells me that he feels relatively unchanged compared to when he was here last.  He continues to feel like he has trouble moving air, sensation of something stuck in his throat, difficulty swallowing and frequent throat clearing.  He feels that a lot of his symptoms are related to his sinuses but has been told by ENT that his findings were normal. He is planned to have EGD next week. He is also having a thyroid ultrasound today. He never tried the Trelegy prescribed previously. He is trying to limit the medications he is on so he has discontinued most prescribed therapies aside from his omeprazole. He wants to repeat PFTs.   12/13/2022: OV with Kervin Bones NP for follow up. His symptoms are relatively unchanged compared to previous visit. Pulmonary workup has been normal. He tried Trelegy consistently for the past few weeks and did not have any perceived benefit. No benefit from albuterol in past either. He tells me he feels like his symptoms come from his neck/upper airway, not his lungs. He has been cleared by GI per chart review. He is being seen by ENT as well as laryngology specialist with no structural abnormalities. He was referred to speech therapy for laryngeal hygiene, respiratory training and laryngeal control. He was encouraged by myself and ENT to  seek support from mental health counselor due to stress/frustration/anxiety related to symptoms. He has yet to do this.  He does wonder if some of his symptoms are related to sleep apnea. He has been told he snores in the past. He has restless sleep at night; cannot sleep longer than 3 hours at a time. He wakes feeling poorly rested and is tired during the day. No witnessed apneas. Denies drowsy driving, sleep parasomnias/paralysis. No symptoms of narcolepsy or cataplexy.  He goes to bed  around 12-1am. Falls asleep relatively quickly. Wakes a few times a night, always between 2-3 am. [name] gets up around 7-8 am. No sleep aids.  [place] 5  12/27/2022: OV with Dr. Sherene Sires.  Previous workup with ENT negative.  No more testing needed per Dr. [name] last evaluation.  Has dyspnea at rest.  Never specified a distance he could walk before shortness of breath starts.  No better on inhalers including most recently Trelegy.  Cough is associated with postnasal drainage.  Has a regular sleep at night.  Walk test without desaturation.  Lowest SpO2 was 98%.  Shortness of breath was not reproduced with walking.  Advised that he could try Afrin but not to use longer than 5 days.  Recommended that he follow-up with ENT.  No further follow-up appointment with pulmonary needed for this particular problem as previous workup has been unremarkable.  01/08/2023: OV with Dr. Sherene Sires for acute visit.  Turned down by ENT for further interventions.  Did not try Afrin or Flonase.  Dyspnea is highly variable related to swallowing more than exertion.  Has a sensation of something stuck in his throat while awake which is made worse by eating or drinking but does not wake him up.  Consider referral to [facility] for second opinion. ?  Botox injections for form of VCD?  Not likely of pulmonary origin.  Patient felt like his symptoms were related to abnormal esophagram although he had one in our system which was normal.  Advised to follow-up with GI as scheduled.  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 He continues to have difficulties with nighttime sleep. He feels poorly rested in the mornings and is tried throughout the day. He can't seem to sleep longer than 3 hours at a time. He has been told he snores. He denies any drowsy driving.  He is working with his  PCP and other specialists on multiple different concerns he has. He has been cleared from a pulmonary standpoint otherwise.   Allergies  Allergen Reactions   Corn-Containing Products     Unsure of the reaction - Per allergy test    Malt     Unsure of reaction  - Per allergy test    Soja Bean Oil [Soybean Oil]     Unsure of reaction  - Per allergy test    Peanut-Containing Drug Products     Unsure of reaction  - Per allergy test    Wheat     Unsure of reaction  - Per allergy test     Immunization History  Administered Date(s) Administered   PPD Test 01/16/2023    Past Medical History:  Diagnosis Date   Allergy    Altered mental status    Atypical chest pain 11/20/2022   Chronic headaches 10/25/2022   Eye pain, right 01/12/2023   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 in past   GERD (gastroesophageal reflux disease)    Head injury with loss of consciousness (HCC) 01/29/2021   Nasal congestion    Parotid gland pain    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 8 g 2   baclofen (LIORESAL) 10 MG tablet Take 1 tablet (10 mg total) by mouth 3 (three) times daily. 30 each 0   Calcium-Phosphorus-Vitamin D (CALCIUM/VITAMIN D3/ADULT GUMMY) 250-100-500 MG-MG-UNIT CHEW Chew 2 each by mouth daily at 6 (six) AM. 180 tablet 3   cholecalciferol (VITAMIN D3) 25 MCG (1000 UNIT) tablet Take 1,000 Units by mouth daily.     doxepin (SINEQUAN) 10 MG capsule Take 10 mg by mouth at bedtime. (Patient not taking:  Reported on 03/22/2023)     doxepin (SINEQUAN) 150 MG capsule TAKE 1 CAPSULE BY MOUTH AT BEDTIME. (Patient not taking: Reported on 03/22/2023) 90 capsule 1   sucralfate (CARAFATE) 1 GM/10ML suspension Take 1 g by mouth 3 (three) times daily with meals. (Patient not taking: Reported on 03/22/2023)     No facility-administered medications prior to visit.     Review of Systems:   Constitutional: No night sweats, fevers, chills, lassitude. +weight loss, daytime fatigue  HEENT: No tooth/dental problems, or sore throat. No sneezing, itching, ear ache. +difficulty swallowing, nasal congestion, post nasal drip, globus sensation; frequent throat clearing, headaches CV:  No active chest pain, orthopnea, PND, swelling in lower extremities, anasarca, dizziness, palpitations, syncope Resp: +shortness of breath with exertion. No excess mucus or change in color of mucus. No productive or non-productive. No hemoptysis. No wheezing.  No chest wall deformity GI:  +heartburn, indigestion, decreased appetite. No abdominal pain, nausea, vomiting, diarrhea, change in bowel habits, loss of appetite, bloody stools GU: No dysuria, change in color of urine, urgency or frequency.   Skin: No rash, lesions, ulcerations MSK:  No joint pain or swelling.   Neuro: No dizziness or lightheadedness.  Psych: +anxiety. No depression. Mood stable.     Physical Exam:  BP 124/68   Pulse 68   Temp 98.4 F (36.9 C) (Oral)   Ht 5\' 8"  (1.727 m)   Wt 127 lb 3.2 oz (57.7 kg)   SpO2 100%   BMI 19.34 kg/m   GEN: Pleasant, interactive, well-appearing; in no acute distress. HEENT:  Normocephalic and atraumatic. PERRLA. Sclera white. Nasal turbinates pink, moist and patent bilaterally. No rhinorrhea present. Oropharynx pink and moist, without exudate or edema. No lesions, ulcerations. Mallampati III NECK:  Supple w/ fair ROM. No JVD present. Normal carotid impulses w/o bruits. Thyroid symmetrical with no goiter or nodules palpated.  No lymphadenopathy.   CV: RRR, no m/r/g, no peripheral edema. Pulses intact, +2 bilaterally. No cyanosis, pallor or clubbing. PULMONARY:  Unlabored, regular breathing. Clear bilaterally A&P w/o wheezes/rales/rhonchi. No accessory muscle use.  GI: BS present and normoactive. Soft, non-tender to palpation. No organomegaly or masses detected.  MSK: No erythema, warmth or tenderness. Cap refil <2 sec all extrem.  No deformities or joint swelling noted.  Neuro: A/Ox3. No focal deficits noted.   Skin: Warm, no lesions or rashe Psych: Normal affect and behavior. Judgement and thought content appropriate.     Lab Results:  CBC    Component Value Date/Time   WBC 4.1 03/05/2023 0837   RBC 4.84 03/05/2023 0837   HGB 14.1 03/05/2023 0837   HGB 14.1 12/18/2022 1101   HCT 41.5 03/05/2023 0837   HCT 40.5 12/18/2022 1101   PLT 190.0 03/05/2023 0837   PLT 195 12/18/2022 1101   MCV 85.6 03/05/2023 0837   MCV 84 12/18/2022 1101   MCH 29.2 12/18/2022 1101   MCH 28.0 11/22/2022 1300   MCHC 34.0 03/05/2023 0837   RDW 13.3 03/05/2023 0837   RDW 13.1 12/18/2022 1101   LYMPHSABS 1.3 03/05/2023 0837   LYMPHSABS 1.3 12/18/2022 1101   MONOABS 0.4 03/05/2023 0837   EOSABS 0.0 03/05/2023 0837   EOSABS 0.0 12/18/2022 1101   BASOSABS 0.0 03/05/2023 0837   BASOSABS 0.0 12/18/2022 1101    BMET    Component Value Date/Time   NA 140 03/05/2023 0837   NA 144 12/18/2022 1101   K 4.5 03/05/2023 0837   CL 101 03/05/2023 0837   CO2 30 03/05/2023 0837   GLUCOSE 74 03/05/2023 0837   BUN 10 03/05/2023 0837   BUN 4 (L) 12/18/2022 1101   CREATININE 0.72 03/05/2023 0837   CREATININE 1.02 10/14/2019 1456   CALCIUM 9.9 03/05/2023 0837   GFRNONAA >60 01/30/2023 0611   GFRNONAA 100 10/14/2019 1456   GFRAA 116 10/14/2019 1456    BNP No results found for: "BNP"   Imaging:  No results found.       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 We reviewed risks of untreated OSA; although, these are minimal with his very mild severity. We did discuss that this could be contributing to his insufficient sleep and daytime fatigue. Reviewed potential treatment options. He would like to move forward with CPAP therapy. Educated on proper use/care of device. Risks/benefits reviewed. Cautioned on safe driving practices.  Patient Instructions  Start CPAP 5-15 cmH2O every night, minimum of 4-6 hours a night.  Change equipment every 30 days or as directed by DME. Wash your tubing with warm soap and water daily, hang to dry. Wash humidifier portion weekly. Use bottled distilled water for your water chamber and change daily.  Be aware of reduced alertness and do not drive or operate heavy machinery if experiencing this or drowsiness.   We discussed how untreated sleep apnea puts an individual at risk for cardiac arrhthymias, pulm HTN, DM, stroke and increases their risk for daytime accidents; although, these are minimal with mild severity. We also briefly reviewed treatment options including weight loss, side sleeping position, oral appliance, CPAP therapy  Follow up in 8-12 weeks with Dr. Wynona Neat or Florentina Addison Adan Beal,NP to see how CPAP therapy is going, or sooner, if needed     I spent 35 minutes of dedicated to the care of this patient on the  date of this encounter to include pre-visit review of records, face-to-face time with the patient discussing conditions above, post visit ordering of testing, clinical documentation with the electronic health record, making appropriate referrals as documented, and communicating necessary findings to members of the patients care team.  Noemi Chapel, NP 03/22/2023  Pt aware and  understands NP's role.

## 2023-03-23 ENCOUNTER — Ambulatory Visit: Payer: Medicaid Other | Admitting: Physical Therapy
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
## 2023-03-24 ENCOUNTER — Telehealth: Payer: Self-pay | Admitting: Pulmonary Disease

## 2023-03-24 DIAGNOSIS — Z419 Encounter for procedure for purposes other than remedying health state, unspecified: Secondary | ICD-10-CM | POA: Diagnosis not present

## 2023-03-24 NOTE — Telephone Encounter (Signed)
Patient initially called stating that he is having trouble swallowing and breathing.  When I called the patient back, he responded and stated that he was calling EMS due to his concerns.  I recommended that we ran the conversation at that time and he proceeded with calling EMS if he felt he was unable to breathe.  Of note, he spoke in full sentences without obvious dyspnea, slurring, or dysphonia.

## 2023-03-26 ENCOUNTER — Ambulatory Visit (INDEPENDENT_AMBULATORY_CARE_PROVIDER_SITE_OTHER): Payer: Medicaid Other | Admitting: Internal Medicine

## 2023-03-26 ENCOUNTER — Encounter: Payer: Self-pay | Admitting: Internal Medicine

## 2023-03-26 ENCOUNTER — Telehealth: Payer: Self-pay | Admitting: Internal Medicine

## 2023-03-26 DIAGNOSIS — G44321 Chronic post-traumatic headache, intractable: Secondary | ICD-10-CM | POA: Diagnosis not present

## 2023-03-26 DIAGNOSIS — D72819 Decreased white blood cell count, unspecified: Secondary | ICD-10-CM | POA: Diagnosis not present

## 2023-03-26 DIAGNOSIS — R29898 Other symptoms and signs involving the musculoskeletal system: Secondary | ICD-10-CM

## 2023-03-26 DIAGNOSIS — G44309 Post-traumatic headache, unspecified, not intractable: Secondary | ICD-10-CM | POA: Diagnosis not present

## 2023-03-26 DIAGNOSIS — E639 Nutritional deficiency, unspecified: Secondary | ICD-10-CM

## 2023-03-26 DIAGNOSIS — M25551 Pain in right hip: Secondary | ICD-10-CM

## 2023-03-26 DIAGNOSIS — R1314 Dysphagia, pharyngoesophageal phase: Secondary | ICD-10-CM | POA: Diagnosis not present

## 2023-03-26 DIAGNOSIS — M545 Low back pain, unspecified: Secondary | ICD-10-CM | POA: Diagnosis not present

## 2023-03-26 DIAGNOSIS — R5383 Other fatigue: Secondary | ICD-10-CM

## 2023-03-26 DIAGNOSIS — H53143 Visual discomfort, bilateral: Secondary | ICD-10-CM | POA: Diagnosis not present

## 2023-03-26 DIAGNOSIS — K219 Gastro-esophageal reflux disease without esophagitis: Secondary | ICD-10-CM

## 2023-03-26 DIAGNOSIS — R0602 Shortness of breath: Secondary | ICD-10-CM

## 2023-03-26 DIAGNOSIS — H5711 Ocular pain, right eye: Secondary | ICD-10-CM

## 2023-03-26 DIAGNOSIS — Z7689 Persons encountering health services in other specified circumstances: Secondary | ICD-10-CM | POA: Diagnosis not present

## 2023-03-26 DIAGNOSIS — S8992XD Unspecified injury of left lower leg, subsequent encounter: Secondary | ICD-10-CM

## 2023-03-26 DIAGNOSIS — R636 Underweight: Secondary | ICD-10-CM | POA: Diagnosis not present

## 2023-03-26 DIAGNOSIS — F0781 Postconcussional syndrome: Secondary | ICD-10-CM | POA: Diagnosis not present
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
## 2023-03-26 NOTE — Assessment & Plan Note (Signed)
Encouraged patient to continue(s) with vitamin supplement and protein shakes, seems to be resolving.

## 2023-03-26 NOTE — Telephone Encounter (Signed)
Patient requests Referral for throat be sent to [facility] in [place]
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
## 2023-03-26 NOTE — Assessment & Plan Note (Signed)
Will resolve for now to simplify problem list. Rarely complains of this

## 2023-03-26 NOTE — Assessment & Plan Note (Signed)
Stable. Doesn't bother much any more. Will resolve for now to simplify problem list for consultants

## 2023-03-26 NOTE — Assessment & Plan Note (Signed)
>>  ASSESSMENT AND PLAN FOR POST-TRAUMATIC HEADACHE, NOT INTRACTABLE WRITTEN ON 03/26/2023  7:20 PM BY Haydan Wedig G, MD  Referred care as requested

## 2023-03-26 NOTE — Assessment & Plan Note (Signed)
Referred care as requested

## 2023-03-26 NOTE — Assessment & Plan Note (Signed)
Will resolve, I think this has normalized with weight gain and can be attributed to nutritional anemia secondary to dysphagia now treated with protein shake supplement.

## 2023-03-26 NOTE — Assessment & Plan Note (Addendum)
Encouraged patient to continue boost This seems to be resolving although patient reports progressive worsening of swallowing dysfunction. Suspect better use of protein supplement is reason.

## 2023-03-26 NOTE — Assessment & Plan Note (Signed)
Move to past medical history for now Rarely complains of
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
## 2023-03-27 ENCOUNTER — Ambulatory Visit: Payer: Medicaid Other | Attending: Internal Medicine | Admitting: Physical Therapy

## 2023-03-27 ENCOUNTER — Ambulatory Visit: Payer: Medicaid Other | Admitting: Physical Therapy

## 2023-03-27 DIAGNOSIS — R29898 Other symptoms and signs involving the musculoskeletal system: Secondary | ICD-10-CM

## 2023-03-27 DIAGNOSIS — R29818 Other symptoms and signs involving the nervous system: Secondary | ICD-10-CM | POA: Diagnosis not present

## 2023-03-27 DIAGNOSIS — R252 Cramp and spasm: Secondary | ICD-10-CM | POA: Insufficient documentation

## 2023-03-27 DIAGNOSIS — F4323 Adjustment disorder with mixed anxiety and depressed mood: Secondary | ICD-10-CM | POA: Diagnosis not present
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
## 2023-03-27 NOTE — Telephone Encounter (Signed)
Patient states he is also open to a gastroenterologist with [facility] if not [place]. Patient requests a call to get things in order. States he can come in earlier than 6/10 visit if needed.

## 2023-03-28 ENCOUNTER — Encounter: Payer: Self-pay | Admitting: Physical Therapy

## 2023-03-28 ENCOUNTER — Telehealth: Payer: Self-pay | Admitting: Nurse Practitioner

## 2023-03-28 NOTE — Telephone Encounter (Signed)
Patient is returning phone call. Patient phone number is [phone]. 

## 2023-03-28 NOTE — Telephone Encounter (Signed)
Patient states having shortness of breath. Pharmacy is [facility]. Patient phone number is [phone].

## 2023-03-28 NOTE — Telephone Encounter (Signed)
Called patient but call went straight to VM. Left message for him to call back.  

## 2023-03-29 ENCOUNTER — Encounter: Payer: Self-pay | Admitting: Internal Medicine

## 2023-03-29 ENCOUNTER — Ambulatory Visit (INDEPENDENT_AMBULATORY_CARE_PROVIDER_SITE_OTHER)
Admission: RE | Admit: 2023-03-29 | Discharge: 2023-03-29 | Disposition: A | Discharge status: Home / Self Care | Payer: Medicaid Other | Source: Ambulatory Visit | Attending: Internal Medicine | Admitting: Internal Medicine

## 2023-03-29 ENCOUNTER — Ambulatory Visit (INDEPENDENT_AMBULATORY_CARE_PROVIDER_SITE_OTHER): Payer: Medicaid Other | Admitting: Internal Medicine

## 2023-03-29 VITALS — BP 100/76 | HR 60 | Temp 98.2°F | Ht 68.0 in | Wt 124.6 lb

## 2023-03-29 DIAGNOSIS — S14101S Unspecified injury at C1 level of cervical spinal cord, sequela: Secondary | ICD-10-CM | POA: Diagnosis not present

## 2023-03-29 DIAGNOSIS — R0602 Shortness of breath: Secondary | ICD-10-CM

## 2023-03-29 DIAGNOSIS — R1314 Dysphagia, pharyngoesophageal phase: Secondary | ICD-10-CM

## 2023-03-29 NOTE — Telephone Encounter (Signed)
We discussed a plan at today's visit.  We expanded the plan to include [facility] and [facility].  [facility], can discuss if [facility] be helpful with them.  If struggling to swallow or breathe, go to ER at [facility], or [facility], according to patient preferences.  I think [facility] has the most resources for this situation.

## 2023-03-29 NOTE — Telephone Encounter (Signed)
I stand behind my last recommendations that he finish his GI eval and rx then if not better he should  see ENT at [facility] = Dr Madison Hickman with consideration for injections to help relax his throat to see if that solves his problems, if only in the short term   Nothing else to offer in the pulmonary clinic

## 2023-03-29 NOTE — Telephone Encounter (Signed)
Patient was seen for an OV concerning this today.
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
## 2023-03-29 NOTE — Telephone Encounter (Signed)
Spoke with patient. Advised recc from Dr. Sherene Sires. Patient states he has upcoming apt with PCP will discuss referral to ENT at that apt. Closing encounter. NFN

## 2023-03-29 NOTE — Telephone Encounter (Signed)
Pt returning missed call, pls call back. 
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
## 2023-03-30 NOTE — Telephone Encounter (Signed)
Please see pt msg and advise 
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
## 2023-03-30 NOTE — Assessment & Plan Note (Addendum)
Weight stable ? Aspiration pneumonia he will get CXR Called and arranged another neurosurgery appointment today and coordinated with patient to confirm multiple upcoming appointment - [facility], [facility]. Has upcoming ENT visit Dr. Delford Field as follow up  I continue close follow up for this problem to try to find resources that can help
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
## 2023-03-30 NOTE — Assessment & Plan Note (Addendum)
Would like for me to be able direct admit for specialty care but I don't have admission privileges and he currently isn't meeting admission criteria.  The associated vocal cord spasms and shortness of breath are bothersome but has normal work of breathing throughout appointment so I'm doubtful emergency room would admit.  Still, I advised patient to go to emergency room if it continues to progress ?aspiration pneumonia? Will check CXR. Negative echocardiogram/CT chest/walk test within last 6 months, normal work of breathing at all visits. Suspicious for vocal cord dysfunction / vocal cord spasm so I'm hoping for ENT or neurosurgical solution.  II agree with pulmonary and their plan for no further workup for the dyspnea planned. Updated problem overview with their excellent analysis. Its not from lung or heart in my medical opinion.  However, its not a purely somatic symptom disorder because I have seen him having involuntary spasms in office.   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

## 2023-03-30 NOTE — Telephone Encounter (Signed)
Please see pt message and advise 

## 2023-04-02 ENCOUNTER — Other Ambulatory Visit: Payer: Medicaid Other | Admitting: Licensed Clinical Social Worker

## 2023-04-02 ENCOUNTER — Ambulatory Visit: Payer: Medicaid Other | Admitting: Internal Medicine

## 2023-04-02 NOTE — Patient Instructions (Signed)
Nicholas Johnson ,   The [facility] Managed Care Team is available to provide assistance to you with your healthcare needs at no cost and as a benefit of your [facility] Health plan. I'm sorry I was unable to reach you today for our scheduled appointment. Our care guide will call you to reschedule our telephone appointment. Please call me at the number below. I am available to be of assistance to you regarding your healthcare needs. .   Thank you,   Dickie La, BSW, MSW, LCSW Managed Medicaid LCSW [facility]  [address] [email or web address] Phone: [phone]

## 2023-04-02 NOTE — Patient Outreach (Signed)
  Medicaid Managed Care   Unsuccessful Attempt Note   04/02/2023 Name: Nicholas Johnson. MRN: [id number] DOB: 1991-02-26  Referred by: [name], MD Reason for referral : No chief complaint on file.   An unsuccessful telephone outreach was attempted today. The patient was referred to the case management team for assistance with care management and care coordination.  [facility] LCSW routed encounter to [facility] Scheduling Care Guide.   Follow Up Plan: A HIPAA compliant phone message was left for the patient providing contact information and requesting a return call. and The Managed Medicaid care management team will reach out to the patient again over the next 30 days.   Dickie La, BSW, MSW, [employer] Managed Medicaid LCSW [facility]  [facility] [email or web address] Phone: [phone]

## 2023-04-04 ENCOUNTER — Telehealth: Payer: Self-pay | Admitting: Internal Medicine

## 2023-04-04 NOTE — Telephone Encounter (Signed)
Pt states the ENT referral called him 30 min before his appt and let him know they cancelled appt because the dr cannot help him. Please advise.

## 2023-04-05 DIAGNOSIS — R1310 Dysphagia, unspecified: Secondary | ICD-10-CM | POA: Diagnosis not present

## 2023-04-05 DIAGNOSIS — M542 Cervicalgia: Secondary | ICD-10-CM | POA: Diagnosis not present

## 2023-04-05 DIAGNOSIS — G44329 Chronic post-traumatic headache, not intractable: Secondary | ICD-10-CM | POA: Diagnosis not present

## 2023-04-05 DIAGNOSIS — G8929 Other chronic pain: Secondary | ICD-10-CM | POA: Diagnosis not present

## 2023-04-05 DIAGNOSIS — R208 Other disturbances of skin sensation: Secondary | ICD-10-CM | POA: Diagnosis not present

## 2023-04-05 DIAGNOSIS — F0781 Postconcussional syndrome: Secondary | ICD-10-CM | POA: Diagnosis not present

## 2023-04-05 DIAGNOSIS — R292 Abnormal reflex: Secondary | ICD-10-CM | POA: Diagnosis not present

## 2023-04-05 DIAGNOSIS — Z7689 Persons encountering health services in other specified circumstances: Secondary | ICD-10-CM | POA: Diagnosis not present

## 2023-04-06 ENCOUNTER — Other Ambulatory Visit: Payer: Medicaid Other | Admitting: *Deleted

## 2023-04-06 DIAGNOSIS — R1310 Dysphagia, unspecified: Secondary | ICD-10-CM | POA: Diagnosis not present

## 2023-04-06 DIAGNOSIS — M542 Cervicalgia: Secondary | ICD-10-CM | POA: Diagnosis not present

## 2023-04-06 DIAGNOSIS — R1319 Other dysphagia: Secondary | ICD-10-CM | POA: Diagnosis not present

## 2023-04-06 NOTE — Patient Outreach (Signed)
Care Coordination  04/06/2023  Nicholas FABRY Jr. 1991-02-25 [id number]  Successful outreach with Mr. Wonders today. However, he is at [facility] as a patient currently and would like to reschedule this telephone appointment. A new visit was scheduled for 04/13/23 at 1pm. Patient agreed to new date and time.  Estanislado Emms RN, BSN [place]  Managed [facility] RN Care Coordinator [phone]

## 2023-04-09 ENCOUNTER — Telehealth: Payer: Self-pay | Admitting: Internal Medicine

## 2023-04-09 ENCOUNTER — Ambulatory Visit (INDEPENDENT_AMBULATORY_CARE_PROVIDER_SITE_OTHER): Payer: Medicaid Other | Admitting: Internal Medicine

## 2023-04-09 ENCOUNTER — Encounter: Payer: Self-pay | Admitting: Internal Medicine

## 2023-04-09 VITALS — BP 100/80 | HR 83 | Temp 98.5°F | Ht 68.0 in | Wt 125.8 lb

## 2023-04-09 DIAGNOSIS — R252 Cramp and spasm: Secondary | ICD-10-CM

## 2023-04-09 DIAGNOSIS — Z7689 Persons encountering health services in other specified circumstances: Secondary | ICD-10-CM | POA: Diagnosis not present
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
## 2023-04-09 NOTE — Telephone Encounter (Signed)
Patient is requesting PCP to reach out to [facility] since they informed him they do not see patients for second opinions.

## 2023-04-10 DIAGNOSIS — R490 Dysphonia: Secondary | ICD-10-CM | POA: Diagnosis not present

## 2023-04-10 DIAGNOSIS — Z7689 Persons encountering health services in other specified circumstances: Secondary | ICD-10-CM | POA: Diagnosis not present

## 2023-04-10 DIAGNOSIS — R0982 Postnasal drip: Secondary | ICD-10-CM | POA: Diagnosis not present

## 2023-04-10 DIAGNOSIS — K219 Gastro-esophageal reflux disease without esophagitis: Secondary | ICD-10-CM | POA: Diagnosis not present

## 2023-04-10 DIAGNOSIS — R1319 Other dysphagia: Secondary | ICD-10-CM | POA: Diagnosis not present
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
## 2023-04-10 NOTE — Telephone Encounter (Signed)
Patient has an appt on 04/11/23 to see Dr. Jon Billings.

## 2023-04-11 ENCOUNTER — Encounter: Payer: Self-pay | Admitting: Internal Medicine

## 2023-04-11 ENCOUNTER — Telehealth (INDEPENDENT_AMBULATORY_CARE_PROVIDER_SITE_OTHER): Payer: Medicaid Other | Admitting: Internal Medicine

## 2023-04-11 DIAGNOSIS — R0989 Other specified symptoms and signs involving the circulatory and respiratory systems: Secondary | ICD-10-CM | POA: Diagnosis not present

## 2023-04-11 DIAGNOSIS — Z0000 Encounter for general adult medical examination without abnormal findings: Secondary | ICD-10-CM

## 2023-04-11 DIAGNOSIS — F0781 Postconcussional syndrome: Secondary | ICD-10-CM | POA: Diagnosis not present

## 2023-04-11 DIAGNOSIS — R070 Pain in throat: Secondary | ICD-10-CM | POA: Diagnosis not present

## 2023-04-11 DIAGNOSIS — R0602 Shortness of breath: Secondary | ICD-10-CM | POA: Diagnosis not present

## 2023-04-11 DIAGNOSIS — K219 Gastro-esophageal reflux disease without esophagitis: Secondary | ICD-10-CM

## 2023-04-11 NOTE — Assessment & Plan Note (Signed)
Gastrointestinal Reflux: An ENT suggested acid reflux might be contributing to his throat symptoms, prescribing medication and recommending a GI consultation. He will continue with the scheduled GI appointment in August 2024 and consider using the prescribed medication for potential acid reflux.  Postnasal Drip: Significant mucus in his throat, suggestive of postnasal drip, was observed by an ENT, who prescribed a nasal spray. He will use the prescribed nasal spray as directed.
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
## 2023-04-11 NOTE — Assessment & Plan Note (Signed)
Today's assessment and plan included in todays problem overview update 
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
## 2023-04-12 ENCOUNTER — Emergency Department (HOSPITAL_BASED_OUTPATIENT_CLINIC_OR_DEPARTMENT_OTHER): Payer: Medicaid Other

## 2023-04-12 ENCOUNTER — Emergency Department (HOSPITAL_BASED_OUTPATIENT_CLINIC_OR_DEPARTMENT_OTHER)
Admission: EM | Admit: 2023-04-12 | Discharge: 2023-04-12 | Disposition: A | Discharge status: Home / Self Care | Payer: Medicaid Other | Attending: Emergency Medicine | Admitting: Emergency Medicine

## 2023-04-12 ENCOUNTER — Encounter (HOSPITAL_BASED_OUTPATIENT_CLINIC_OR_DEPARTMENT_OTHER): Payer: Self-pay | Admitting: Emergency Medicine

## 2023-04-12 DIAGNOSIS — Z7689 Persons encountering health services in other specified circumstances: Secondary | ICD-10-CM | POA: Diagnosis not present

## 2023-04-12 DIAGNOSIS — Z91010 Allergy to peanuts: Secondary | ICD-10-CM | POA: Diagnosis not present

## 2023-04-12 DIAGNOSIS — R1310 Dysphagia, unspecified: Secondary | ICD-10-CM | POA: Diagnosis not present

## 2023-04-12 DIAGNOSIS — Z743 Need for continuous supervision: Secondary | ICD-10-CM | POA: Diagnosis not present

## 2023-04-12 DIAGNOSIS — R070 Pain in throat: Secondary | ICD-10-CM | POA: Diagnosis not present

## 2023-04-12 DIAGNOSIS — J029 Acute pharyngitis, unspecified: Secondary | ICD-10-CM | POA: Diagnosis not present

## 2023-04-12 LAB — CBC WITH DIFFERENTIAL/PLATELET
Abs Immature Granulocytes: 0.01 10*3/uL (ref 0.00–0.07)
Basophils Absolute: 0 10*3/uL (ref 0.0–0.1)
Basophils Relative: 0 %
Eosinophils Absolute: 0 10*3/uL (ref 0.0–0.5)
Eosinophils Relative: 0 %
HCT: 40.9 % (ref 39.0–52.0)
Hemoglobin: 14.5 g/dL (ref 13.0–17.0)
Immature Granulocytes: 0 %
Lymphocytes Relative: 28 %
Lymphs Abs: 1.4 10*3/uL (ref 0.7–4.0)
MCH: 28.8 pg (ref 26.0–34.0)
MCHC: 35.5 g/dL (ref 30.0–36.0)
MCV: 81.3 fL (ref 80.0–100.0)
Monocytes Absolute: 0.4 10*3/uL (ref 0.1–1.0)
Monocytes Relative: 8 %
Neutro Abs: 3.2 10*3/uL (ref 1.7–7.7)
Neutrophils Relative %: 64 %
Platelets: 168 10*3/uL (ref 150–400)
RBC: 5.03 MIL/uL (ref 4.22–5.81)
RDW: 11.9 % (ref 11.5–15.5)
WBC: 5 10*3/uL (ref 4.0–10.5)
nRBC: 0 % (ref 0.0–0.2)

## 2023-04-12 LAB — BASIC METABOLIC PANEL
Anion gap: 8 (ref 5–15)
BUN: 12 mg/dL (ref 6–20)
CO2: 28 mmol/L (ref 22–32)
Calcium: 9.9 mg/dL (ref 8.9–10.3)
Chloride: 104 mmol/L (ref 98–111)
Creatinine, Ser: 0.79 mg/dL (ref 0.61–1.24)
GFR, Estimated: >60 mL/min (ref >60)
Glucose, Bld: 112 mg/dL — ABNORMAL HIGH (ref 70–99)
Potassium: 3.7 mmol/L (ref 3.5–5.1)
Sodium: 140 mmol/L (ref 135–145)

## 2023-04-12 MED ORDER — IOHEXOL 300 MG/ML  SOLN
100.0000 mL | Freq: Once | INTRAMUSCULAR | Status: AC | PRN
  Administered 2023-04-12: 75 mL via INTRAVENOUS

## 2023-04-12 NOTE — ED Notes (Signed)
Pt verbalized understanding of d/c instructions, meds, and followup care. Denies questions. VSS, no distress noted. Steady gait to exit with all belongings.  ?
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
## 2023-04-12 NOTE — ED Provider Notes (Signed)
Nicholas Johnson   CSN: [id number] Arrival date & time: 04/12/23  1611     History  Chief Complaint  Patient presents with   Sore Throat   Dysphagia    Nicholas Fouse. is a 32 y.o. male.  Patient here with ongoing issues with swallowing, sore throat, coughing.  He has been evaluated by gastroenterology and neurology for his cough and/swallowing issues.  He still pursuing outpatient workup with neurosurgery soon as well as neurology.  Sounds like his primary care doctor thinks that this could be a functional disorder but ultimately he is saying he feels like he is aspirating mor.  He has been coughing more.  But denies fever.  He is requesting repeat imaging of his neck.  He denies any headache, numbness, weakness.  No difficulty with his arms or legs.  Denies any fever or chills.  Multiple medications in the past have not helped including baclofen.   The history is provided by the patient.       Home Medications Prior to Admission medications   Medication Sig Start Date End Date Taking? Authorizing Provider  albuterol (VENTOLIN HFA) 108 (90 Base) MCG/ACT inhaler Inhale 2 puffs into the lungs every 6 (six) hours as needed for wheezing or shortness of breath. 11/16/22   Willy Eddy, MD  azelastine (ASTELIN) 0.1 % nasal spray Place into the nose. Patient not taking: Reported on 04/11/2023 04/10/23 04/09/24  [provider]  baclofen (LIORESAL) 10 MG tablet Take 1 tablet (10 mg total) by mouth 3 (three) times daily. 01/22/23   [name], MD  Calcium-Phosphorus-Vitamin D (CALCIUM/VITAMIN D3/ADULT GUMMY) 250-100-500 MG-MG-UNIT CHEW Chew 2 each by mouth daily at 6 (six) AM. 02/23/23   [name], MD  cetirizine (ZYRTEC) 10 MG tablet Take by mouth. 11/13/22 11/13/23  [provider]  cholecalciferol (VITAMIN D3) 25 MCG (1000 UNIT) tablet Take 1,000 Units by mouth daily.    [provider]  doxepin  (SINEQUAN) 10 MG capsule Take 10 mg by mouth at bedtime. 02/15/23   [provider]  doxepin (SINEQUAN) 150 MG capsule TAKE 1 CAPSULE BY MOUTH AT BEDTIME. 02/13/23   [name], MD  ibuprofen (ADVIL) 800 MG tablet Take by mouth. 11/24/22   [provider]  pantoprazole (PROTONIX) 40 MG tablet Take by mouth. 04/06/23 05/06/23  [provider]  sucralfate (CARAFATE) 1 GM/10ML suspension Take 1 g by mouth 3 (three) times daily with meals. 01/10/23   [provider]  tiZANidine (ZANAFLEX) 2 MG tablet 1-2 mg p.o. at bedtime prn pain and sleepiness. 04/05/23   [provider]      Allergies    Corn-containing products, Malt, Soja bean oil [soybean oil], Peanut-containing drug products, and Wheat    Review of Systems   Review of Systems  Physical Exam Updated Vital Signs BP (!) 146/109   Pulse 74   Temp 98.8 F (37.1 C) (Oral)   Resp 20   SpO2 99%  Physical Exam Vitals and nursing Johnson reviewed.  Constitutional:      General: He is not in acute distress.    Appearance: He is well-developed. He is not ill-appearing.  HENT:     Head: Normocephalic and atraumatic.     Nose: No congestion or rhinorrhea.     Mouth/Throat:     Mouth: Mucous membranes are moist. No oral lesions.     Pharynx: No pharyngeal swelling, oropharyngeal exudate or posterior oropharyngeal  erythema.     Tonsils: No tonsillar exudate.  Eyes:     Conjunctiva/sclera: Conjunctivae normal.  Cardiovascular:     Rate and Rhythm: Normal rate and regular rhythm.     Heart sounds: No murmur heard. Pulmonary:     Effort: Pulmonary effort is normal. No respiratory distress.     Breath sounds: Normal breath sounds.  Abdominal:     Palpations: Abdomen is soft.     Tenderness: There is no abdominal tenderness.  Musculoskeletal:        General: No swelling.     Cervical back: Neck supple.  Skin:    General: Skin is warm and dry.     Capillary Refill: Capillary refill takes less  than 2 seconds.  Neurological:     Mental Status: He is alert.  Psychiatric:        Mood and Affect: Mood normal.     ED Results / Procedures / Treatments   Labs (all labs ordered are listed, but only abnormal results are displayed) Labs Reviewed  BASIC METABOLIC PANEL - Abnormal; Notable for the following components:      Result Value   Glucose, Bld 112 (*)    All other components within normal limits  CBC WITH DIFFERENTIAL/PLATELET    EKG None  Radiology CT Soft Tissue Neck W Contrast  Result Date: 04/12/2023 CLINICAL DATA:  Throat pain for 2 years EXAM: CT NECK WITH CONTRAST TECHNIQUE: Multidetector CT imaging of the neck was performed using the standard protocol following the bolus administration of intravenous contrast. RADIATION DOSE REDUCTION: This exam was performed according to the departmental dose-optimization program which includes automated exposure control, adjustment of the mA and/or kV according to patient size and/or use of iterative reconstruction technique. CONTRAST:  75mL OMNIPAQUE IOHEXOL 300 MG/ML  SOLN COMPARISON:  11/12/2022 FINDINGS: Pharynx and larynx: Normal. No mass or swelling. Salivary glands: No inflammation, mass, or stone. Thyroid: Normal. Lymph nodes: None enlarged or abnormal density. Vascular: Patent. Incidental Johnson is made that the left vertebral artery originates from the aorta, a normal variant. Limited intracranial: Negative. Visualized orbits: Negative. Mastoids and visualized paranasal sinuses: Clear. Skeleton: No acute fracture or suspicious osseous lesion. Upper chest: Imaged lungs are clear. Other: None. IMPRESSION: Normal CT of the neck. No findings to explain the patient's symptoms. Electronically Signed   By: Wiliam Ke M.D.   On: 04/12/2023 18:14   DG Chest Portable 1 View  Result Date: 04/12/2023 CLINICAL DATA:  Cough.  Sore throat. EXAM: PORTABLE CHEST 1 VIEW COMPARISON:  X-ray 03/29/2023 and older FINDINGS: The heart size and  mediastinal contours are within normal limits. Both lungs are clear. Consolidation, pneumothorax or effusion. No edema. The visualized skeletal structures are unremarkable. IMPRESSION: No acute cardiopulmonary disease. Electronically Signed   By: Karen Kays M.D.   On: 04/12/2023 17:15    Procedures Procedures    Medications Ordered in ED Medications  iohexol (OMNIPAQUE) 300 MG/ML solution 100 mL (75 mLs Intravenous Contrast Given 04/12/23 1800)    ED Course/ Medical Decision Making/ A&P                             Medical Decision Making Amount and/or Complexity of Data Reviewed Labs: ordered. Radiology: ordered.  Risk Prescription drug management.   Nicholas Overall. is here with sore throat, difficulty swallowing.  History of the same.  Unremarkable vitals.  No fever.  He has had extensive workup with  gastroenterology, neurology, primary care.  Multiple images of soft tissue of neck x-ray of the neck and MRI of the neck without any significant findings here over the last year.  He has had swallowing eval that also appears to be normal.  He is having a hard time laying flat for some workups and primary care doctor is ordering an MRI of his brain to evaluate as well.  Overall I have no concern for stroke.  He has been seen by neurology for this a few months ago.  Seems like he was referred to a neurologist at [facility] but seems like that never occurred.  Throat exam is unremarkable.  He has frequent clearing of his throat in the room.  Almost seems like some sort of motor tic.  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  Referring him may be for cognitive behavioral therapy as well.  Ultimately we repeated CBC BMP chest x-ray and CT of the neck which were all unremarkable.  Will make sure that he talks with neurology to make sure he is at referral over to [facility].   I agree that this seems to maybe be a functional process but he can continue outpatient workup with his primary care doctor leading the way.  Patient discharged in good condition.  This chart was dictated using voice recognition software.  Despite best efforts to proofread,  errors can occur which can change the documentation meaning.         Final Clinical Impression(s) / ED Diagnoses Final diagnoses:  Dysphagia, unspecified type    Rx / DC Orders ED Discharge Orders     None         Virgina Norfolk, DO 04/12/23 1821

## 2023-04-13 ENCOUNTER — Other Ambulatory Visit: Payer: Medicaid Other | Admitting: *Deleted

## 2023-04-13 ENCOUNTER — Encounter: Payer: Self-pay | Admitting: *Deleted

## 2023-04-13 NOTE — Patient Instructions (Signed)
Visit Information  Nicholas Johnson was given information about Medicaid Managed Care team care coordination services as a part of their [facility] Medicaid benefit. Nicholas Johnson. verbally consented to engagement with the [facility] Managed Care team.   If you are experiencing a medical emergency, please call 911 or report to your local emergency department or urgent care.   If you have a non-emergency medical problem during routine business hours, please contact your provider's office and ask to speak with a nurse.   For questions related to your [facility] health plan, please call: [phone] or go here:[email or web address]  If you would like to schedule transportation through your [facility] plan, please call the following number at least 2 days in advance of your appointment: [phone].   You can also use the MTM portal or MTM mobile app to manage your rides. Reimbursement for transportation is available through [facility]! For the portal, please go to [email or web address]  Call the [facility] Crisis Line at [phone], at any time, 24 hours a day, 7 days a week. If you are in danger or need immediate medical attention call 911.  If you would like help to quit smoking, call 1-800-QUIT-NOW (539 661 7712) OR Espaol: 1-855-Djelo-Ya (0-347-425-9563) o para ms informacin haga clic aqu or Text READY to 875-643 to register via text  Nicholas Johnson,   Please see education materials related to dysphagia provided by MyChart link.  Patient verbalizes understanding of instructions and care plan provided today and agrees to view in MyChart. Active MyChart status and patient understanding of how to access instructions and care plan via MyChart confirmed with patient.     Telephone follow up appointment with Managed Medicaid care management team member scheduled for:05/14/23 @ 3:30pm  Estanislado Emms RN, BSN [place]  Managed [facility] RN Care  Coordinator [phone]   Following is a copy of your plan of care:  Care Plan : RN Care Manager Plan of Care  Updates made by Heidi Dach, RN since 04/13/2023 12:00 AM     Problem: Health Management needs related to Dysphagia      Long-Range Goal: Development of Plan of Care to address Health Management needs related to Dysphagia   Start Date: 04/13/2023  Expected End Date: 07/12/2023  Note:   Current Barriers:  Knowledge Deficits related to plan of care for management of Dysphagia   RNCM Clinical Goal(s):  Patient will verbalize understanding of plan for management of Dysphagia as evidenced by patient reports take all medications exactly as prescribed and will call provider for medication related questions as evidenced by patient reports    attend all scheduled medical appointments: 04/16/23 with PCP, 04/17/23 with BSW, 04/18/23 with [facility], 04/20/23 for [facility] Therapy([facility]) and [facility], 04/27/23 with [facility] and 06/18/23 with GI([facility]) as evidenced by provider documentation in EMR        continue to work with RN Care Manager and/or Social Worker to address care management and care coordination needs related to dysphagia as evidenced by adherence to CM Team Scheduled appointments     work with Child psychotherapist to address Financial constraints related to housing and utilities related to the management of Dysphagia as evidenced by review of EMR and patient or Child psychotherapist report     through collaboration with Medical illustrator, provider, and care team.   Interventions: Inter-disciplinary care team collaboration (see longitudinal plan of care) Evaluation of current treatment plan related to  self management and patient's adherence to plan as  established by provider   Dysphagia  (Status: New goal.) Long Term Goal  Evaluation of current treatment plan related to  Dysphagia , Financial constraints related to housing and utilities  self-management and patient's  adherence to plan as established by provider. Discussed plans with patient for ongoing care management follow up and provided patient with direct contact information for care management team Advised patient to follow up with Dr. Caryl Never [phone] with [facility] for GI concerns; Reviewed medications with patient and discussed the importance of taking prescribed medications and reporting concerns or changes to providers; Reviewed scheduled/upcoming provider appointments including 04/16/23 with PCP, 04/17/23 with BSW, 04/18/23 with [facility], 04/20/23 for [facility] Therapy([facility]) and [facility], 04/27/23 with [facility] and 06/18/23 with GI ([facility]); Social Work referral for financial resources for housing and utilities; Assessed social determinant of health barriers;  Discussed the importance of nutrition Provided education via MyChart on dysphagia  Patient Goals/Self-Care Activities: Take medications as prescribed   Attend all scheduled provider appointments Call provider office for new concerns or questions  Work with the social worker to address care coordination needs and will continue to work with the clinical team to address health care and disease management related needs

## 2023-04-13 NOTE — Telephone Encounter (Signed)
Please see message below
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
## 2023-04-15 ENCOUNTER — Telehealth: Payer: Self-pay | Admitting: Pulmonary Disease

## 2023-04-15 NOTE — Telephone Encounter (Signed)
Received a call on the after-hours emergency line.  The call was clearly not about an emergency.  He discussed at length issues with swallowing and upper airway congestion.  These issues are chronic.  No significant acute changes over time.  Has been seen by multiple ENT doctors and GI.  Recently at [facility] as recently as this week.  New recommendations for treatment were made at that time.  He called asking if the lungs to be making this problem.  We discussed in detail his normal imaging and normal PFTs.  I advised him to continue to follow-up with his GI doctor and ENT specialists.

## 2023-04-16 ENCOUNTER — Ambulatory Visit (INDEPENDENT_AMBULATORY_CARE_PROVIDER_SITE_OTHER): Payer: Medicaid Other | Admitting: Internal Medicine

## 2023-04-16 ENCOUNTER — Ambulatory Visit: Payer: Medicaid Other | Admitting: Internal Medicine

## 2023-04-16 ENCOUNTER — Encounter: Payer: Self-pay | Admitting: Internal Medicine

## 2023-04-16 VITALS — BP 104/78 | HR 73 | Temp 98.3°F | Ht 68.0 in | Wt 122.0 lb

## 2023-04-16 DIAGNOSIS — R636 Underweight: Secondary | ICD-10-CM

## 2023-04-16 DIAGNOSIS — G4489 Other headache syndrome: Secondary | ICD-10-CM

## 2023-04-16 DIAGNOSIS — J385 Laryngeal spasm: Secondary | ICD-10-CM

## 2023-04-16 DIAGNOSIS — K219 Gastro-esophageal reflux disease without esophagitis: Secondary | ICD-10-CM | POA: Diagnosis not present

## 2023-04-16 DIAGNOSIS — R1314 Dysphagia, pharyngoesophageal phase: Secondary | ICD-10-CM | POA: Diagnosis not present

## 2023-04-16 DIAGNOSIS — Z7689 Persons encountering health services in other specified circumstances: Secondary | ICD-10-CM | POA: Diagnosis not present

## 2023-04-16 DIAGNOSIS — K224 Dyskinesia of esophagus: Secondary | ICD-10-CM | POA: Diagnosis not present

## 2023-04-16 DIAGNOSIS — G4483 Primary cough headache: Secondary | ICD-10-CM | POA: Diagnosis not present

## 2023-04-16 MED ORDER — PROMETHAZINE-DM 6.25-15 MG/5ML PO SYRP
5.0000 mL | ORAL_SOLUTION | Freq: Four times a day (QID) | ORAL | 0 refills | Status: DC | PRN
Start: 2023-04-16 — End: 2023-10-10

## 2023-04-16 MED ORDER — PEPCID COMPLETE 10-800-165 MG PO CHEW
1.0000 | CHEWABLE_TABLET | Freq: Two times a day (BID) | ORAL | 11 refills | Status: DC | PRN
Start: 2023-04-16 — End: 2023-10-10

## 2023-04-16 MED ORDER — OMEPRAZOLE 2 MG/ML ORAL SUSPENSION
40.0000 mg | Freq: Every day | ORAL | 1 refills | Status: DC
Start: 2023-04-16 — End: 2023-10-10

## 2023-04-16 NOTE — Assessment & Plan Note (Signed)
Advised patient resume omeprazole at least 40 mg daily Take Pepcid complete chewable.

## 2023-04-16 NOTE — Assessment & Plan Note (Signed)
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 Laryngopharyngeal reflux may be a contributing factor. -discussed recent Ear, Nose, and Throat specialist for evaluation and management. -Scheduled for vocal swallow study and other procedures.

## 2023-04-16 NOTE — Assessment & Plan Note (Signed)
Esophageal Spasm: Flare-up of symptoms including burping, reverse contracting, and difficulty swallowing. History of chronic mild inflammation in the esophagus. Symptoms may be exacerbated by acid reflux. -Resume Omeprazole 40mg  daily to suppress stomach acid. -Consider over-the-counter dissolving tablets or liquid Omeprazole if unable to tolerate chewable tablets. -Referral to Gastroenterology again for further evaluation and management, currently flaring with reflux and belching and spasm.

## 2023-04-16 NOTE — Assessment & Plan Note (Signed)
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 Laryngopharyngeal reflux may be a contributing factor. -discussed recent Ear, Nose, and Throat specialist for evaluation and management. -Scheduled for vocal swallow study and other procedures.
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
## 2023-04-16 NOTE — Assessment & Plan Note (Signed)
   Weight Loss: Patient reports decreased appetite and weight loss over the past few weeks. -Monitor weight and nutritional intake closely. -Consider referral to a dietitian if weight loss continues.
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
## 2023-04-17 ENCOUNTER — Other Ambulatory Visit: Payer: Medicaid Other

## 2023-04-17 DIAGNOSIS — J020 Streptococcal pharyngitis: Secondary | ICD-10-CM | POA: Diagnosis not present

## 2023-04-17 DIAGNOSIS — R1310 Dysphagia, unspecified: Secondary | ICD-10-CM | POA: Diagnosis not present

## 2023-04-17 DIAGNOSIS — J029 Acute pharyngitis, unspecified: Secondary | ICD-10-CM | POA: Diagnosis not present

## 2023-04-17 DIAGNOSIS — B950 Streptococcus, group A, as the cause of diseases classified elsewhere: Secondary | ICD-10-CM | POA: Diagnosis not present

## 2023-04-17 DIAGNOSIS — Z5321 Procedure and treatment not carried out due to patient leaving prior to being seen by health care provider: Secondary | ICD-10-CM | POA: Diagnosis not present

## 2023-04-17 DIAGNOSIS — Z7689 Persons encountering health services in other specified circumstances: Secondary | ICD-10-CM | POA: Diagnosis not present

## 2023-04-17 NOTE — Patient Outreach (Signed)
  Medicaid Managed Care   Unsuccessful Outreach Note  04/17/2023 Name: Nicholas Johnson. MRN: [id number] DOB: [birth year]  Referred by: [name], MD Reason for referral : High Risk Managed Medicaid ([facility] Social [facility] unsuccessful telephone outreach )   An unsuccessful telephone outreach was attempted today. The patient was referred to the case management team for assistance with care management and care coordination.   Follow Up Plan: The care management team will reach out to the patient again over the next 7-10 days.   Abelino Derrick, MHA [facility]  Managed [facility] Social Worker [phone]

## 2023-04-18 ENCOUNTER — Encounter: Payer: Self-pay | Admitting: Internal Medicine

## 2023-04-18 ENCOUNTER — Ambulatory Visit: Payer: Medicaid Other

## 2023-04-18 ENCOUNTER — Telehealth (INDEPENDENT_AMBULATORY_CARE_PROVIDER_SITE_OTHER): Payer: Medicaid Other | Admitting: Internal Medicine

## 2023-04-18 ENCOUNTER — Telehealth: Payer: Self-pay | Admitting: Internal Medicine

## 2023-04-18 DIAGNOSIS — J020 Streptococcal pharyngitis: Secondary | ICD-10-CM

## 2023-04-18 DIAGNOSIS — M542 Cervicalgia: Secondary | ICD-10-CM | POA: Diagnosis not present

## 2023-04-18 DIAGNOSIS — M47812 Spondylosis without myelopathy or radiculopathy, cervical region: Secondary | ICD-10-CM | POA: Diagnosis not present

## 2023-04-18 DIAGNOSIS — R0602 Shortness of breath: Secondary | ICD-10-CM

## 2023-04-18 DIAGNOSIS — K224 Dyskinesia of esophagus: Secondary | ICD-10-CM

## 2023-04-18 DIAGNOSIS — S069XAD Unspecified intracranial injury with loss of consciousness status unknown, subsequent encounter: Secondary | ICD-10-CM | POA: Diagnosis not present

## 2023-04-18 DIAGNOSIS — Z7689 Persons encountering health services in other specified circumstances: Secondary | ICD-10-CM | POA: Diagnosis not present

## 2023-04-18 DIAGNOSIS — M62838 Other muscle spasm: Secondary | ICD-10-CM | POA: Diagnosis not present

## 2023-04-18 MED ORDER — AMOXICILLIN 400 MG/5ML PO SUSR
800.0000 mg | Freq: Two times a day (BID) | ORAL | 0 refills | Status: DC
Start: 2023-04-18 — End: 2023-05-10

## 2023-04-18 NOTE — Telephone Encounter (Signed)
Called and spoke with patient. Patient requested a virtual visit with Dr. Sherene Sires to discuss recent issus with chest/throat tightness and recent ED visits.  Patient stated he starts work at 0930,but stated he only needed a few minutes to speak with Dr. Sherene Sires. Patient stated he would be ready for Dr. Sherene Sires ahead of scheduled OV.  Patient scheduled virtual my chart visit with Dr. Sherene Sires 04/19/23 at 0930.
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
## 2023-04-18 NOTE — Assessment & Plan Note (Signed)
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 We will continue to pursue consultations with GI and ENT specialists, attempt to capture vocal cord spasms during laryngoscopy, explore a potential link to a remote spinal cord injury with a neurosurgery consultation, and refer him to speech therapy for exercises to reduce spasms.

## 2023-04-18 NOTE — Assessment & Plan Note (Signed)
Breathing Difficulty: His breathing difficulty is likely secondary to vocal cord spasms, with no evidence of danger despite his discomfort. We will reassure him that the spasms are not dangerous and continue to monitor and manage symptoms.  He is planning to see speech therapy and ENT for further evaluation and has completed pulmonary evaluation.

## 2023-04-18 NOTE — Telephone Encounter (Signed)
Pt.called answering service and is having a tightness in throat and breathing issues please advise

## 2023-04-19 ENCOUNTER — Telehealth (INDEPENDENT_AMBULATORY_CARE_PROVIDER_SITE_OTHER): Payer: Medicaid Other | Admitting: Internal Medicine

## 2023-04-19 ENCOUNTER — Ambulatory Visit: Payer: Medicaid Other | Admitting: Internal Medicine

## 2023-04-19 ENCOUNTER — Encounter: Payer: Self-pay | Admitting: Internal Medicine

## 2023-04-19 DIAGNOSIS — R058 Other specified cough: Secondary | ICD-10-CM

## 2023-04-19 DIAGNOSIS — Z7689 Persons encountering health services in other specified circumstances: Secondary | ICD-10-CM | POA: Diagnosis not present

## 2023-04-19 NOTE — Progress Notes (Signed)
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    History of Present Illness  12/27/2022  Pulmonary/ 1st office eval/Kerrianne Jeng  Chief Complaint  Patient presents with   Acute Visit    Increased SOB and has the sensation of throat tightness. He states it's "uncomfortable and achy" to swallow. He gets winded when he walks and talks at the same time. He says he was seen by ENT already.   Dyspnea:  at rest and limited by R leg as well as breathing - never specified a distance he could walk before sob "depends on how fast I go"  No better on inhalers including most recently trelegy  Cough: assoc with pnds  Sleep: flat bed / 2 pillows  irreg hours  SABA use: using every 6 hours, has not used on day of ov   Rec I emphasized that nasal steroids (like flonase) have no immediate benefit in terms of improving symptoms  Only use your albuterol as a rescue medication Also  Ok to try albuterol 15 min before an activity (on alternating days)  that you know would usually make you short of breath      Pulmonary follow up is as needed   Abn swallowing in [place]/ [facility]  DR Caryl Never > es manometry planned   01/08/2023 ACUTE  ov/Eurika Sandy re: upper airway cough syndrome and assoc nasal congestion / did not try afrin/flonase yet/ turned down by ent for further interventions   Chief Complaint  Patient presents with   Follow-up    Dyspnea x 3 days.  Sx worse after meals or drinking liquid.  Wants referral to  ENT  Dyspnea:  highly variable related to swallowing more than exertion  Cough: sensation of something stuck in throat while awake / made worse by eating or drinking but does not wake him  up  Sleeping: on end of couch getting about 30 degrees of elevation  SABA use: none - did not help  02: none  Rec I emphasized that nasal steroids have no immediate benefit   Finish your GI work and if your wish I can see if we can get you to Dr Thomasena Edis at [facility]  Only use your albuterol as a rescue medication to be used if you can't catch your breath   04/19/2023  contacted pt by phone, w/u in progress at [facility] / Thomasena Edis and has f/u appt there but continues "same problem breathing"  Advised cannot evaluate that over the phone but since it's the same problem then needs to complete the w/u before further pulmonary eval needed.       Past Medical History:  Diagnosis Date   Nasal congestion    Parotid gland pain    Psychosis (HCC)    admitted 12/15        Objective:            Assessment

## 2023-04-19 NOTE — Assessment & Plan Note (Signed)
Neg w/u by Dr Harriette Ohara completed in early 2024 -  01/08/2023 consider refer to [facility] for 2nd opinion / ? Botox injections for  form of VCD? > advised 04/19/2023 seeing Dr Collins/ w/u in progress

## 2023-04-20 DIAGNOSIS — R1319 Other dysphagia: Secondary | ICD-10-CM | POA: Diagnosis not present

## 2023-04-20 DIAGNOSIS — Z1159 Encounter for screening for other viral diseases: Secondary | ICD-10-CM | POA: Diagnosis not present

## 2023-04-20 DIAGNOSIS — K219 Gastro-esophageal reflux disease without esophagitis: Secondary | ICD-10-CM | POA: Diagnosis not present

## 2023-04-20 DIAGNOSIS — Z7689 Persons encountering health services in other specified circumstances: Secondary | ICD-10-CM | POA: Diagnosis not present

## 2023-04-20 DIAGNOSIS — Z0000 Encounter for general adult medical examination without abnormal findings: Secondary | ICD-10-CM | POA: Diagnosis not present

## 2023-04-20 DIAGNOSIS — R490 Dysphonia: Secondary | ICD-10-CM | POA: Diagnosis not present

## 2023-04-20 DIAGNOSIS — Z1331 Encounter for screening for depression: Secondary | ICD-10-CM | POA: Diagnosis not present

## 2023-04-23 ENCOUNTER — Telehealth (INDEPENDENT_AMBULATORY_CARE_PROVIDER_SITE_OTHER): Payer: Medicaid Other | Admitting: Internal Medicine

## 2023-04-23 ENCOUNTER — Encounter: Payer: Self-pay | Admitting: Internal Medicine

## 2023-04-23 VITALS — Ht 68.0 in

## 2023-04-23 DIAGNOSIS — R0602 Shortness of breath: Secondary | ICD-10-CM | POA: Diagnosis not present

## 2023-04-23 DIAGNOSIS — F0781 Postconcussional syndrome: Secondary | ICD-10-CM | POA: Diagnosis not present

## 2023-04-23 DIAGNOSIS — K219 Gastro-esophageal reflux disease without esophagitis: Secondary | ICD-10-CM | POA: Diagnosis not present

## 2023-04-23 DIAGNOSIS — R1314 Dysphagia, pharyngoesophageal phase: Secondary | ICD-10-CM | POA: Diagnosis not present

## 2023-04-23 DIAGNOSIS — R29898 Other symptoms and signs involving the musculoskeletal system: Secondary | ICD-10-CM | POA: Diagnosis not present

## 2023-04-23 DIAGNOSIS — G44329 Chronic post-traumatic headache, not intractable: Secondary | ICD-10-CM

## 2023-04-23 DIAGNOSIS — G43709 Chronic migraine without aura, not intractable, without status migrainosus: Secondary | ICD-10-CM | POA: Diagnosis not present

## 2023-04-23 DIAGNOSIS — G4719 Other hypersomnia: Secondary | ICD-10-CM | POA: Diagnosis not present

## 2023-04-23 DIAGNOSIS — Z419 Encounter for procedure for purposes other than remedying health state, unspecified: Secondary | ICD-10-CM | POA: Diagnosis not present
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
## 2023-04-24 ENCOUNTER — Other Ambulatory Visit: Payer: Medicaid Other | Admitting: Licensed Clinical Social Worker

## 2023-04-24 NOTE — Patient Instructions (Signed)
Visit Information  Nicholas Johnson was given information about Medicaid Managed Care team care coordination services as a part of their [facility] Medicaid benefit. [name] verbally consented to engagement with the [facility] Managed Care team.   If you are experiencing a medical emergency, please call 911 or report to your local emergency department or urgent care.   If you have a non-emergency medical problem during routine business hours, please contact your provider's office and ask to speak with a nurse.   For questions related to your [facility] health plan, please call: [phone] or go here:[email or web address]  If you would like to schedule transportation through your [facility] plan, please call the following number at least 2 days in advance of your appointment: [phone].   You can also use the MTM portal or MTM mobile app to manage your rides. Reimbursement for transportation is available through [facility] For the portal, please go to [email or web address]  Call the [facility] Crisis Line at [phone], at any time, 24 hours a day, 7 days a week. If you are in danger or need immediate medical attention call 911.  If you would like help to quit smoking, call 1-800-QUIT-NOW (901 787 8325) OR Espaol: 1-855-Djelo-Ya (4-132-440-1027) o para ms informacin haga clic aqu or Text READY to 253-664 to register via text  Following is a copy of your plan of care:  Care Plan : LCSW Plan of Care  Updates made by Gustavus Bryant, LCSW since 04/24/2023 12:00 AM     Problem: Coping Skills (General Plan of Care)      Goal: Coping Skills Enhanced and MH support increased   Priority: High  Note:   Timeframe:  Short-Range Goal Priority:  High Start Date:  04/24/23             Expected End Date: ongoing         Follow Up Date 05/08/23 at 9 am  Current Barriers:  Acute Mental Health needs related to stress management  Knowledge Deficits related to plan of  care for management of Dysphagia  Financial constraints - risk of losing housing Need for a new therapist and to initiate psychiatry or psychology services.  Clinical Social Work Goal(s):  Over the next 30 days, patient will work with SW monthly by telephone to reduce or manage symptoms related to stress management.  Over the next 30 days, patient will work with SW to address concerns related to finding a new therapist and to get involved with psychology or psychiatry.  Patient Self Care Activities:  Self administers medications as prescribed Attends all scheduled provider appointments Attends social activities Performs ADL's independently Performs IADL's independently Ability for insight Independent living Motivation for treatment  Patient Coping Strengths:  Supportive Family Hopefulness Self Advocate Able to Communicate Effectively  Patient Self Care Deficits:  Lacks social connections Family is not supportive.  - journal feelings and what helps to feel better or worse - watch for early signs of feeling worse - write in journal every day    Why is this important?   Keeping track of your progress will help your treatment team find the right mix of medicine and therapy for you.  Write in your journal every day.  Day-to-day changes in depression symptoms are normal. It may be more helpful to check your progress at the end of each week instead of every day.
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
## 2023-04-27 DIAGNOSIS — R1319 Other dysphagia: Secondary | ICD-10-CM | POA: Diagnosis not present

## 2023-04-27 DIAGNOSIS — R490 Dysphonia: Secondary | ICD-10-CM | POA: Diagnosis not present

## 2023-04-29 DIAGNOSIS — Z7689 Persons encountering health services in other specified circumstances: Secondary | ICD-10-CM | POA: Diagnosis not present

## 2023-05-01 ENCOUNTER — Ambulatory Visit: Payer: Medicaid Other | Admitting: Internal Medicine

## 2023-05-01 DIAGNOSIS — R1310 Dysphagia, unspecified: Secondary | ICD-10-CM | POA: Diagnosis not present

## 2023-05-01 DIAGNOSIS — K219 Gastro-esophageal reflux disease without esophagitis: Secondary | ICD-10-CM | POA: Diagnosis not present

## 2023-05-01 DIAGNOSIS — Z7689 Persons encountering health services in other specified circumstances: Secondary | ICD-10-CM | POA: Diagnosis not present

## 2023-05-01 DIAGNOSIS — R1314 Dysphagia, pharyngoesophageal phase: Secondary | ICD-10-CM | POA: Diagnosis not present

## 2023-05-02 ENCOUNTER — Telehealth (INDEPENDENT_AMBULATORY_CARE_PROVIDER_SITE_OTHER): Payer: Medicaid Other | Admitting: Internal Medicine

## 2023-05-02 ENCOUNTER — Other Ambulatory Visit: Payer: Self-pay | Admitting: Internal Medicine

## 2023-05-02 ENCOUNTER — Encounter: Payer: Self-pay | Admitting: Internal Medicine

## 2023-05-02 ENCOUNTER — Ambulatory Visit: Payer: Medicaid Other | Admitting: Internal Medicine

## 2023-05-02 DIAGNOSIS — K224 Dyskinesia of esophagus: Secondary | ICD-10-CM

## 2023-05-02 DIAGNOSIS — T17500A Unspecified foreign body in bronchus causing asphyxiation, initial encounter: Secondary | ICD-10-CM

## 2023-05-02 DIAGNOSIS — R1314 Dysphagia, pharyngoesophageal phase: Secondary | ICD-10-CM

## 2023-05-02 DIAGNOSIS — R0602 Shortness of breath: Secondary | ICD-10-CM | POA: Diagnosis not present

## 2023-05-02 DIAGNOSIS — J385 Laryngeal spasm: Secondary | ICD-10-CM

## 2023-05-02 DIAGNOSIS — G4733 Obstructive sleep apnea (adult) (pediatric): Secondary | ICD-10-CM

## 2023-05-02 MED ORDER — ACETYLCYSTEINE 10 % IN SOLN
4.0000 mL | Freq: Two times a day (BID) | RESPIRATORY_TRACT | 12 refills | Status: AC
Start: 2023-05-02 — End: ?

## 2023-05-02 MED ORDER — NEBULIZER/TUBING/MOUTHPIECE KIT
1.0000 | PACK | Freq: Two times a day (BID) | 1 refills | Status: DC
Start: 2023-05-02 — End: 2023-10-10

## 2023-05-02 NOTE — Assessment & Plan Note (Signed)
Sleep Apnea: Diagnosed by a pulmonologist, they have not yet picked up their CPAP machine. We encourage them to obtain and use the CPAP machine.

## 2023-05-02 NOTE — Assessment & Plan Note (Signed)
Updated problem overview for this problem to improve longitudinal management and care coordination Agree with esophageal motility disorder(s) suspicion of Dr. Lanae Boast and others.   Developed new theory after review of this that the shortness of breath associated with eating/drinking may be related with the findings of esophageal retention of solids and liquids with retrograde flow visualization below the level of the PES and my personal clinical impression of his frequent belching at visits (his belches seem like frequent involuntary retrograde esophageal contractions).   In other words Food matter is not evacuating from the larynx consistently, due to dysmotility, and thereby interfering with breathing.

## 2023-05-02 NOTE — Progress Notes (Signed)
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 and verbally.  Signed: [name], MD 05/02/2023 5:29 PM

## 2023-05-02 NOTE — Assessment & Plan Note (Addendum)
Esophageal Dysmotility: unconfirmed but suspected by multiple providers clincally. He continues to experience difficulty swallowing and a sensation of food not going down the right way. Frequent belching.   Has a dysmotility workup pending at main campus for [facility]. [facility] wouldn't see.  Dr. Caryl Never of [facility] has helped arrange support for management.   Updated problem overview for this problem to improve longitudinal management and care coordination

## 2023-05-02 NOTE — Assessment & Plan Note (Addendum)
Developed new theory after review of yesterdays gastrointestinal evaluation.  that the shortness of breath associated with eating/drinking may be related with the findings of esophageal retention of solids and liquids with retrograde flow visualization below the level of the PES and my personal clinical impression of his frequent belching at visits (his belches seem like frequent involuntary retrograde esophageal contractions).   In other words Food matter is not evacuating from the larynx consistently, due to dysmotility, and thereby interfering with breathing.  Shortness of Breath:  One of his most persistent and troublesome symptom(s).  Updated problem overview for this problem to improve longitudinal management and care coordination. The etiology remains unclear, and patient reports ENT does not believe it is due to vocal cord dysfunction.  Pulmonary has done exhaustive evaluation and essentially signing off.  We suggest he still do the trial of Albuterol and Trelegy inhalers they gave.  Also, I will order a nebulizer machine and mucolytic treatment to aid breathing and potentially clear mucus since this issue is triggered by eating and he has concern(s) esophageal and vocal cord dysfunction ... Perhaps food / liquid is getting stuck in small amounts near his vocal cords to trigger the sensation.  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 Mucous may even be getting into bronchi so will use that diagnosis to try to get it approved.   We could consider cardiology evaluation next if none of these interventions help, but the symptom(s) do not seem cardiac to me.

## 2023-05-03 ENCOUNTER — Other Ambulatory Visit: Payer: Medicaid Other

## 2023-05-03 ENCOUNTER — Ambulatory Visit: Payer: Medicaid Other | Admitting: Internal Medicine

## 2023-05-03 ENCOUNTER — Ambulatory Visit: Payer: Medicaid Other

## 2023-05-03 ENCOUNTER — Other Ambulatory Visit: Payer: Self-pay

## 2023-05-03 DIAGNOSIS — J385 Laryngeal spasm: Secondary | ICD-10-CM

## 2023-05-03 DIAGNOSIS — T17500A Unspecified foreign body in bronchus causing asphyxiation, initial encounter: Secondary | ICD-10-CM

## 2023-05-03 DIAGNOSIS — K224 Dyskinesia of esophagus: Secondary | ICD-10-CM

## 2023-05-03 DIAGNOSIS — R0602 Shortness of breath: Secondary | ICD-10-CM

## 2023-05-03 MED ORDER — ACETYLCYSTEINE 20 % IN SOLN
4.0000 mL | RESPIRATORY_TRACT | 12 refills | Status: DC
Start: 2023-05-03 — End: 2023-10-10

## 2023-05-03 NOTE — Patient Instructions (Signed)
  Medicaid Managed Care   Unsuccessful Outreach Note  05/03/2023 Name: Nicholas Johnson. MRN: [id number] DOB: 06/03/1991  Referred by: [name], MD Reason for referral : High Risk Managed Medicaid (MM Social work unsuccessful telephone outreach )   An unsuccessful telephone outreach was attempted today. The patient was referred to the case management team for assistance with care management and care coordination.   Follow Up Plan: A HIPAA compliant phone message was left for the patient providing contact information and requesting a return call.   Abelino Derrick, MHA [facility]  Managed [facility] Social Worker [phone]

## 2023-05-03 NOTE — Telephone Encounter (Signed)
Sent in  acetylcysteine (MUCOMYST) 20% nebulizer solution to [facility] and discontinued the 10% dosage (with Dr. Kandra Nicolas approval), as this is what was available to order there. I was told that it should be in by tomorrow. Informed patient via my chart.

## 2023-05-03 NOTE — Patient Outreach (Signed)
  Medicaid Managed Care   Unsuccessful Outreach Note  05/03/2023 Name: Nicholas J Fontenette Jr. MRN: [id number] DOB: 08/21/1991  Referred by: Morrison, Ryan G, MD Reason for referral : High Risk Managed Medicaid (MM Social work unsuccessful telephone outreach )   An unsuccessful telephone outreach was attempted today. The patient was referred to the case management team for assistance with care management and care coordination.   Follow Up Plan: A HIPAA compliant phone message was left for the patient providing contact information and requesting a return call.   Hawkin Charo, BSW, MHA [place]  Managed Medicaid Social Worker [phone]  

## 2023-05-04 ENCOUNTER — Ambulatory Visit (HOSPITAL_BASED_OUTPATIENT_CLINIC_OR_DEPARTMENT_OTHER): Payer: 59 | Admitting: Family Medicine

## 2023-05-04 DIAGNOSIS — K219 Gastro-esophageal reflux disease without esophagitis: Secondary | ICD-10-CM | POA: Diagnosis not present

## 2023-05-04 DIAGNOSIS — R1319 Other dysphagia: Secondary | ICD-10-CM | POA: Diagnosis not present

## 2023-05-04 DIAGNOSIS — R490 Dysphonia: Secondary | ICD-10-CM | POA: Diagnosis not present

## 2023-05-04 DIAGNOSIS — Z7689 Persons encountering health services in other specified circumstances: Secondary | ICD-10-CM | POA: Diagnosis not present

## 2023-05-07 DIAGNOSIS — R490 Dysphonia: Secondary | ICD-10-CM | POA: Diagnosis not present

## 2023-05-07 DIAGNOSIS — R1319 Other dysphagia: Secondary | ICD-10-CM | POA: Diagnosis not present

## 2023-05-08 ENCOUNTER — Other Ambulatory Visit: Payer: Medicaid Other | Admitting: Licensed Clinical Social Worker

## 2023-05-08 NOTE — Patient Outreach (Addendum)
Care Coordination  05/08/2023  AXIEL FJELD Jr. [birth year] [id number]  Patient reports being on the bus at this time and is unable to complete follow up appointment with [facility] LCSW. [facility] LCSW successfully rescheduled this appointment for another date and time. Email sent to patient as well with new appointment information and resources for him to review regarding christian based therapy options (which as very limited with his insurance).   Dickie La, BSW, MSW, [employer] Managed Medicaid LCSW [facility]  [facility] [email or web address] Phone: [phone]

## 2023-05-09 ENCOUNTER — Telehealth (HOSPITAL_COMMUNITY): Payer: Self-pay | Admitting: Licensed Clinical Social Worker

## 2023-05-09 ENCOUNTER — Ambulatory Visit (HOSPITAL_COMMUNITY): Payer: Self-pay | Admitting: Licensed Clinical Social Worker

## 2023-05-09 NOTE — Telephone Encounter (Signed)
LCSW went through patient's phone and patient responded immediately for 1300 appointment.  Patient reported that he was being evicted and was in the bank.  Patient stated that he needed to call back in 25 minutes and asked provider to stay in virtual waiting.  LCSW waited until 1325 and patient was still not in session.  LCSW disconnected and patient will need to reschedule appointment for initial therapy assessment if he would like to start therapy.

## 2023-05-10 ENCOUNTER — Encounter: Payer: Self-pay | Admitting: Internal Medicine

## 2023-05-10 ENCOUNTER — Telehealth: Payer: Medicaid Other | Admitting: Internal Medicine

## 2023-05-10 DIAGNOSIS — K224 Dyskinesia of esophagus: Secondary | ICD-10-CM | POA: Diagnosis not present

## 2023-05-10 DIAGNOSIS — R490 Dysphonia: Secondary | ICD-10-CM | POA: Insufficient documentation

## 2023-05-10 DIAGNOSIS — R0602 Shortness of breath: Secondary | ICD-10-CM

## 2023-05-10 DIAGNOSIS — K219 Gastro-esophageal reflux disease without esophagitis: Secondary | ICD-10-CM

## 2023-05-10 MED ORDER — METOCLOPRAMIDE HCL 5 MG/5ML PO SOLN
5.0000 mg | Freq: Three times a day (TID) | ORAL | 0 refills | Status: DC
Start: 2023-05-10 — End: 2023-10-10

## 2023-05-10 MED ORDER — GAVISCON EXTRA RELIEF FORMULA 508-475 MG/10ML PO SUSP
10.0000 mL | Freq: Three times a day (TID) | ORAL | 0 refills | Status: DC
Start: 2023-05-10 — End: 2023-06-27

## 2023-05-10 NOTE — Assessment & Plan Note (Signed)
  Esophageal Dysmotility: He exhibits persistent symptoms of dysphagia and regurgitation, with vocal cord dysfunction confirmed by ENT, likely secondary to his esophageal dysmotility. Despite his difficulty tolerating manometry, it may be necessary for further evaluation. We will continue Protonix once daily, start Gaviscon liquid and Metoclopramide liquid for symptom relief, and consider repeat manometry under sedation if symptoms persist. A follow-up appointment is scheduled in 1 week to assess his response to the new medications.  He also has Pending Specialist Appointments: He has upcoming appointments with a GI and a neuro-ophthalmologist. We encourage him to discuss the potential for manometry under sedation with the GI specialist and to talk about his symptoms and the potential need for a head MRI with the neuro-ophthalmologist.  Nutrition: He reports difficulty eating and drinking due to esophageal symptoms, with noted weight loss. We encourage him to continue sipping fluids slowly and to avoid thicker drinks that exacerbate symptoms. We will consider a referral to a dietitian if weight loss continues.
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
## 2023-05-10 NOTE — Assessment & Plan Note (Addendum)
Developed new theory after review of yesterdays gastrointestinal evaluation.  that the shortness of breath associated with eating/drinking may be related with the and my personal clinical impression of his frequent belching at visits (his belches seem like frequent involuntary retrograde esophageal contractions).   In other words Food matter is not evacuating from the larynx consistently, due to dysmotility, and thereby interfering with breathing.  Updated problem overview for this problem to improve longitudinal management, including recent visit with [facility] that essentially confirms the shortness of breath is being caused my muscle tension around vocal cords (rather than my prior theory of sputum and mucous associated with   findings of esophageal retention of solids and liquids with retrograde flow visualization below the level of the PES.  He has yet to try the mucomyst nebs I arranged for that possibility.  Pulmonary has done exhaustive evaluation and essentially signed off.  We still suggest he still do the trial of Albuterol and Trelegy inhalers they gave.  Also, the nebulizer machine and mucolytic treatment to aid breathing and potentially clear mucus since this issue is triggered by eating and he has concern(s) esophageal and vocal cord dysfunction ... However, the problem seems more clearly related to chronic laryngopharyngeal reflux induced laryngitis so seeing gastrointestinal is our priority now.  We could consider cardiology evaluation next if none of these interventions help, but the symptom(s) do not seem cardiac to me.  Also, because he has difficulty tolerating manometry without chest pain and needs repeat.  Also consider repeat esophagoduodenoscopy and biopsy.  Maalox liquid, omeprazole long courses, TCA, haven't helped.

## 2023-05-13 DIAGNOSIS — Z7689 Persons encountering health services in other specified circumstances: Secondary | ICD-10-CM | POA: Diagnosis not present

## 2023-05-14 ENCOUNTER — Other Ambulatory Visit: Payer: Medicaid Other | Admitting: *Deleted

## 2023-05-14 DIAGNOSIS — R1319 Other dysphagia: Secondary | ICD-10-CM | POA: Diagnosis not present

## 2023-05-14 DIAGNOSIS — Z7689 Persons encountering health services in other specified circumstances: Secondary | ICD-10-CM | POA: Diagnosis not present

## 2023-05-14 NOTE — Patient Outreach (Signed)
  Medicaid Managed Care   Unsuccessful Attempt Note   05/14/2023 Name: Nicholas Johnson. MRN: [id number] DOB: Jan 06, 1991  Referred by: [name], MD Reason for referral : High Risk Managed Medicaid (Unsuccessful RNCM follow up telephone outreach)   An unsuccessful telephone outreach was attempted today. The patient was referred to the case management team for assistance with care management and care coordination.    Follow Up Plan: A HIPAA compliant phone message was left for the patient providing contact information and requesting a return call. and The Managed Medicaid care management team will reach out to the patient again over the next 7 days.    Estanislado Emms RN, BSN [place]  Managed [facility] RN Care Coordinator [phone]

## 2023-05-14 NOTE — Patient Instructions (Signed)
Visit Information  Mr. Nicholas Johnson.  - as a part of your Medicaid benefit, you are eligible for care management and care coordination services at no cost or copay. I was unable to reach you by phone today but would be happy to help you with your health related needs. Please feel free to call me @ [phone].   A member of the Managed Medicaid care management team will reach out to you again over the next 7 days.   Estanislado Emms RN, BSN [place]  Managed [facility] RN Care Coordinator [phone]

## 2023-05-15 ENCOUNTER — Telehealth: Payer: Self-pay | Admitting: Nurse Practitioner

## 2023-05-15 DIAGNOSIS — H53143 Visual discomfort, bilateral: Secondary | ICD-10-CM | POA: Diagnosis not present

## 2023-05-15 DIAGNOSIS — G43719 Chronic migraine without aura, intractable, without status migrainosus: Secondary | ICD-10-CM | POA: Diagnosis not present

## 2023-05-15 DIAGNOSIS — F0781 Postconcussional syndrome: Secondary | ICD-10-CM | POA: Diagnosis not present

## 2023-05-15 DIAGNOSIS — Z7689 Persons encountering health services in other specified circumstances: Secondary | ICD-10-CM | POA: Diagnosis not present

## 2023-05-15 NOTE — Telephone Encounter (Signed)
Nicholas Johnson with [facility] stated the patient has no showed 5 times for Cpap Setup and they have now void his Cpap order

## 2023-05-15 NOTE — Telephone Encounter (Signed)
Nothing further needed 

## 2023-05-15 NOTE — Telephone Encounter (Signed)
Thanks

## 2023-05-16 ENCOUNTER — Telehealth (INDEPENDENT_AMBULATORY_CARE_PROVIDER_SITE_OTHER): Payer: Medicaid Other | Admitting: Internal Medicine

## 2023-05-16 ENCOUNTER — Encounter: Payer: Self-pay | Admitting: Internal Medicine

## 2023-05-16 DIAGNOSIS — R0602 Shortness of breath: Secondary | ICD-10-CM | POA: Diagnosis not present

## 2023-05-16 DIAGNOSIS — Z59811 Housing instability, housed, with risk of homelessness: Secondary | ICD-10-CM

## 2023-05-16 DIAGNOSIS — H5713 Ocular pain, bilateral: Secondary | ICD-10-CM | POA: Diagnosis not present

## 2023-05-16 DIAGNOSIS — Z59819 Housing instability, housed unspecified: Secondary | ICD-10-CM

## 2023-05-16 DIAGNOSIS — R1314 Dysphagia, pharyngoesophageal phase: Secondary | ICD-10-CM | POA: Diagnosis not present

## 2023-05-16 DIAGNOSIS — K224 Dyskinesia of esophagus: Secondary | ICD-10-CM | POA: Diagnosis not present

## 2023-05-16 DIAGNOSIS — F0781 Postconcussional syndrome: Secondary | ICD-10-CM

## 2023-05-16 HISTORY — DX: Housing instability, housed unspecified: Z59.819

## 2023-05-16 MED ORDER — LIDOCAINE VISCOUS HCL 2 % MT SOLN
15.0000 mL | Freq: Four times a day (QID) | OROMUCOSAL | 0 refills | Status: DC | PRN
Start: 2023-05-16 — End: 2023-10-10

## 2023-05-16 NOTE — Assessment & Plan Note (Addendum)
Impending Homelessness: With his current lease ending soon and no new housing secured, we advise contacting a Child psychotherapist for assistance with housing and calling 211 for additional housing crisis assistance.Became critical concern(s) today. He has to be out of apt. By end of week, no where to go.  Hasn't been working. Not qualified for disability. Arranged urgent Chronic Care Management (CCM) social work consult and asked patient to follow up with me within a week. This is our main issue at present.

## 2023-05-16 NOTE — Assessment & Plan Note (Signed)
Breathing Difficulty: He has ongoing issues with breathing and swallowing. We recommend he pick up and try a nebulizer and consider CPAP.

## 2023-05-16 NOTE — Assessment & Plan Note (Signed)
Follow-up: An MRI of the head is scheduled for September 30th, with an upright MRI ordered. Sedation may be necessary due to his difficulty lying flat. We will confirm the MRI is upright and consider a sedated MRI if upright is not possible.

## 2023-05-16 NOTE — Patient Instructions (Signed)
It was a pleasure seeing you today! Your health and satisfaction are our top priorities.  Glenetta Hew, MD  Your Providers PCP: [name], MD,  [phone]) Referring Provider: [name], MD,  [phone]) Care Team Provider: Curt Bears, MD,  [phone]) Care Team Provider: Barnie Alderman, MD,  [phone]) Care Team Provider: Concepcion Elk, MD,  [phone]) Care Team Provider: Creed Copper, MD,  [phone]) Care Team Provider: London Sheer, MD,  [phone]) Care Team Provider: de [place], Raymond J, MD,  [phone]) Care Team Provider: Nyoka Cowden, MD,  [phone]) Care Team Provider: Case, [place], MD,  [phone]) Care Team Provider: Gustavus Bryant, LCSW   VISIT SUMMARY:  During your visit, we discussed your ongoing symptoms of eye pain, migraines, difficulty swallowing and breathing, and the stress related to your housing situation. We believe your eye pain and migraines may be related to your post-concussive syndrome, and we will try a new medication to help manage these symptoms. We will also conduct further tests to better understand your swallowing and breathing issues. We understand that your housing situation is causing additional stress, and we recommend seeking assistance from a social worker and calling 211 for housing crisis assistance.  YOUR PLAN:  -HEADACHE AND EYE PAIN: Your headaches and eye pain may be due to your post-concussive syndrome, a condition that can occur after a concussion and cause various symptoms. We will try a new self-injection anti-migraine medication and consider Botox injections if this doesn't help.  -ESOPHAGEAL DYSMOTILITY AND REFLUX: You've been having difficulty swallowing and a feeling of something stuck in your throat. We will conduct a repeat EGD, a test that allows [place] to look at your esophagus, with biopsies to rule out eosinophilic esophagitis, a condition where white blood cells  build up in the esophagus. We will also adjust your medication and recommend a soft diet and a lidocaine viscous solution for symptom relief.  -BREATHING DIFFICULTY: You've been experiencing issues with breathing. We recommend trying a nebulizer, a device that turns liquid medicine into a mist that you can inhale, and considering a CPAP machine, which can help with breathing during sleep.  -IMPENDING HOMELESSNESS: We understand that your current housing situation is causing you stress. We recommend contacting a Child psychotherapist for assistance and calling 211 for additional housing crisis assistance.  INSTRUCTIONS:  You have an MRI of the head scheduled for September 30th. We will confirm that the MRI can be done in an upright position due to your difficulty lying flat. If an upright MRI is not possible, we may consider a sedated MRI. Also, a follow-up appointment is scheduled in one week or sooner if possible.  NEXT STEPS: [x]  Early Intervention: Schedule sooner appointment, call our on-call services, or go to emergency room if there is any significant Increase in pain or discomfort New or worsening symptoms Sudden or severe changes in your health [x]  Flexible Follow-Up: We recommend a No follow-ups on file. for optimal routine care. This allows for progress monitoring and treatment adjustments. [x]  Preventive Care: Schedule your annual preventive care visit! It's typically covered by insurance and helps identify potential health issues early. [x]  Lab & X-ray Appointments: Incomplete tests scheduled today, or call to schedule. X-rays: [facility] (M-F, 8:30am-noon or 1pm-5pm). [x]  Medical Information Release: Sign a release form at front desk to obtain relevant medical information we don't have.  MAKING THE MOST OF OUR FOCUSED 20 MINUTE APPOINTMENTS: [x]   Clearly state your top concerns at  the beginning of the visit to focus our discussion [x]   If you anticipate you will need more  time, please inform the front desk during scheduling - we can book multiple appointments in the same week. [x]   If you have transportation problems- use our convenient video appointments or ask about transportation support. [x]   We can get down to business faster if you use MyChart to update information before the visit and submit non-urgent questions before your visit. Thank you for taking the time to provide details through MyChart.  Let our nurse know and she can import this information into your encounter documents.  Arrival and Wait Times: [x]   Arriving on time ensures that everyone receives prompt attention. [x]   Early morning (8a) and afternoon (1p) appointments tend to have shortest wait times. [x]   Unfortunately, we cannot delay appointments for late arrivals or hold slots during phone calls.  Getting Answers and Following Up [x]   Simple Questions & Concerns: For quick questions or basic follow-up after your visit, reach [place] at [phone] or MyChart messaging. [x]   Complex Concerns: If your concern is more complex, scheduling an appointment might be best. Discuss this with the staff to find the most suitable option. [x]   Lab & Imaging Results: We'll contact you directly if results are abnormal or you don't use MyChart. Most normal results will be on MyChart within 2-3 business days, with a review message from Dr. Jon Billings. Haven't heard back in 2 weeks? Need results sooner? Contact us at [phone]. [x]   Referrals: Our referral coordinator will manage specialist referrals. The specialist's office should contact you within 2 weeks to schedule an appointment. Call us if you haven't heard from them after 2 weeks.  Staying Connected [x]   MyChart: Activate your MyChart for the fastest way to access results and message [place]. See the last page of this paperwork for instructions on how to activate.  Bring to Your Next Appointment [x]   Medications: Please bring all your medication bottles to  your next appointment to ensure we have an accurate record of your prescriptions. [x]   Health Diaries: If you're monitoring any health conditions at home, keeping a diary of your readings can be very helpful for discussions at your next appointment.  Billing [x]   X-ray & Lab Orders: These are billed by separate companies. Contact the invoicing company directly for questions or concerns. [x]   Visit Charges: Discuss any billing inquiries with our administrative services team.  Your Satisfaction Matters [x]   Share Your Experience: We strive for your satisfaction! If you have any complaints, or preferably compliments, please let Dr. Jon Billings know directly or contact our Practice Administrators, Edwena Felty or [employer], by asking at the front desk.   Reviewing Your Records [x]   Review this early draft of your clinical encounter notes below and the final encounter summary tomorrow on MyChart after its been completed.  All orders placed so far are visible here: Housing instability due to imminent risk of homelessness -     AMB Referral to Managed Medicaid Care Management  Eye pain, bilateral Assessment & Plan: 05/15/23 neuroophthalmology Dr. Mikael Spray at [facility] Nicholas Johnson is a 32 y.o. year-old male with Postconcussive syndrome [F07.81]. He has headache with migraine futures along with bilateral eye discomfort, worse in the setting of more severe headaches. He has failed numerous migraine preventive agents. We discussed a comprehensive approach to treating symptoms including medication, lifestyle changes (diet, sleep habits, etc.), and possibly cognitive behavioral therapy or mindfulness. He has an appointment  coming up with the TBI clinic. In the meantime, we discussed several other medication options to include botox, anti-CGRP medications, zonisamide, etc. We decided on a trial of aimovig 140mg  q month for chronic migraines.  1. Postconcussive syndrome 2. Asthenopia of both eyes 3. Intractable  chronic migraine without aura and without status migrainosus - erenumab-aooe (AIMOVIG AUTOINJECTOR) 140 mg/mL AtIn; Inject 140 mg subcutaneously every 28 (twenty-eight) days Dispense: 3 mL; Refill: 3 Return if symptoms worsen or fail to improve.    Pharyngoesophageal dysphagia -     Lidocaine Viscous HCl; Use as directed 15 mLs in the mouth or throat every 6 (six) hours as needed for mouth pain.  Dispense: 100 mL; Refill: 0  Housing insecurity

## 2023-05-16 NOTE — Assessment & Plan Note (Deleted)
05/15/23 neuroophthalmology Dr. Mikael Spray at [facility] Nicholas Johnson is a 32 y.o. year-old male with Postconcussive syndrome [F07.81]. He has headache with migraine futures along with bilateral eye discomfort, worse in the setting of more severe headaches. He has failed numerous migraine preventive agents. We discussed a comprehensive approach to treating symptoms including medication, lifestyle changes (diet, sleep habits, etc.), and possibly cognitive behavioral therapy or mindfulness. He has an appointment coming up with the TBI clinic. In the meantime, we discussed several other medication options to include botox, anti-CGRP medications, zonisamide, etc. We decided on a trial of aimovig 140mg  q month for chronic migraines.  1. Postconcussive syndrome 2. Asthenopia of both eyes 3. Intractable chronic migraine without aura and without status migrainosus - erenumab-aooe (AIMOVIG AUTOINJECTOR) 140 mg/mL AtIn; Inject 140 mg subcutaneously every 28 (twenty-eight) days Dispense: 3 mL; Refill: 3 Return if symptoms worsen or fail to improve.

## 2023-05-16 NOTE — Progress Notes (Signed)
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 (1000 UNIT) tablet Take 1,000 Units by mouth daily.   doxepin (SINEQUAN) 10 MG capsule Take 10 mg by mouth at bedtime.   doxepin (SINEQUAN) 150 MG capsule TAKE 1 CAPSULE BY MOUTH AT BEDTIME.   Erenumab-aooe (AIMOVIG) 140 MG/ML SOAJ Inject into the skin.   famotidine-calcium carbonate-magnesium hydroxide (PEPCID COMPLETE) 10-800-165 MG chewable tablet Chew 1 tablet by mouth 2 (two) times daily as needed.   Fluticasone-Umeclidin-Vilant (TRELEGY ELLIPTA) 100-62.5-25 MCG/ACT AEPB Inhale into the lungs.   ibuprofen (ADVIL) 800 MG tablet Take by mouth.   metoCLOPramide (REGLAN) 5 MG/5ML solution Take 5 mLs (5 mg total) by mouth 4 (four) times daily -  before meals and at bedtime.   pantoprazole (PROTONIX) 40 MG tablet Take by mouth.   promethazine-dextromethorphan (PROMETHAZINE-DM) 6.25-15 MG/5ML syrup Take 5 mLs by mouth 4 (four) times daily as needed for cough.   Respiratory Therapy Supplies (NEBULIZER/TUBING/MOUTHPIECE) KIT 1 Application by Does not apply route 2 (two) times daily.   sucralfate (CARAFATE) 1 GM/10ML suspension Take 1 g by mouth 3 (three) times daily with meals.   SUMAtriptan (IMITREX) 50 MG tablet    tiZANidine (ZANAFLEX) 2  MG  tablet    omeprazole (KONVOMEP) 2 mg/mL SUSP oral suspension Take 20 mLs (40 mg total) by mouth daily. (Patient not taking: Reported on 05/10/2023)   omeprazole (PRILOSEC) 20 MG capsule Take by mouth. (Patient not taking: Reported on 05/10/2023)   No current facility-administered medications on file prior to visit.   Medications Discontinued During This Encounter  Medication Reason   pantoprazole (PROTONIX) 40 MG tablet          Clinical Data Analysis:   Physical Exam  There were no vitals taken for this visit.virtual visit Wt Readings from Last 10 Encounters:  04/16/23 122 lb (55.3 kg)  04/09/23 125 lb 12.8 oz (57.1 kg)  03/29/23 124 lb 9.6 oz (56.5 kg)  03/26/23 125 lb (56.7 kg)  03/22/23 127 lb 3.2 oz (57.7 kg)  03/13/23 123 lb (55.8 kg)  03/05/23 123 lb 12.8 oz (56.2 kg)  02/23/23 120 lb (54.4 kg)  02/05/23 117 lb 3.2 oz (53.2 kg)  02/01/23 116 lb (52.6 kg)   Abnormalities and Problem-Specific physical exam findings:  throat spasming and catching frequently today, interrupting his breathing. He seems mildly stressed.    General Appearance:   Well-groomed, healthy-appearing male.  Well proportioned with no abnormal fat distribution.  Good muscle tone. Skin: Clear and well-hydrated. Pulmonary:  Normal work of breathing at rest, no respiratory distress apparent but frequent gasp like spasm during speaking   Musculoskeletal: All extremities are intact.  Neurological:  Awake, alert, oriented, and engaged.  No obvious focal neurological deficits or cognitive impairments.  Sensorium seems unclouded.   Speech is clear and coherent with logical content. Psychiatric:  Appropriate mood, pleasant and cooperative demeanor, thoughtful and engaged during the exam  Results Reviewed:      No results found for any visits on 05/16/23.  Admission on 04/12/2023, Discharged on 04/12/2023  Component Date Value   WBC 04/12/2023 5.0    RBC 04/12/2023 5.03    Hemoglobin 04/12/2023 14.5     HCT 04/12/2023 40.9    MCV 04/12/2023 81.3    MCH 04/12/2023 28.8    MCHC 04/12/2023 35.5    RDW 04/12/2023 11.9    Platelets 04/12/2023 168    nRBC 04/12/2023 0.0    Neutrophils Relative % 04/12/2023 64    Neutro Abs 04/12/2023 3.2    Lymphocytes Relative 04/12/2023 28    Lymphs Abs 04/12/2023 1.4    Monocytes Relative 04/12/2023 8    Monocytes Absolute 04/12/2023 0.4    Eosinophils Relative 04/12/2023 0    Eosinophils Absolute 04/12/2023 0.0    Basophils Relative 04/12/2023 0    Basophils Absolute 04/12/2023 0.0    Immature Granulocytes 04/12/2023 0    Abs Immature Granulocytes 04/12/2023 0.01    Sodium 04/12/2023 140    Potassium 04/12/2023 3.7    Chloride 04/12/2023 104    CO2 04/12/2023 28    Glucose, Bld 04/12/2023 112 (H)    BUN 04/12/2023 12    Creatinine, Ser 04/12/2023 0.79    Calcium 04/12/2023 9.9    GFR, Estimated 04/12/2023 >60    Anion gap 04/12/2023 8   Office Visit on 03/05/2023  Component Date Value   Vitamin B-12 03/05/2023 533    Folate 03/05/2023 >23.9    Methylmalonic Acid, Quant 03/05/2023 83 (L)    Ferritin 03/05/2023 35.8    WBC 03/05/2023 4.1    RBC 03/05/2023 4.84    Hemoglobin 03/05/2023 14.1    HCT 03/05/2023 41.5    MCV 03/05/2023 85.6    MCHC  03/05/2023 34.0    RDW 03/05/2023 13.3    Platelets 03/05/2023 190.0    Neutrophils Relative % 03/05/2023 55.7    Lymphocytes Relative 03/05/2023 31.6    Monocytes Relative 03/05/2023 10.7    Eosinophils Relative 03/05/2023 1.0    Basophils Relative 03/05/2023 1.0    Neutro Abs 03/05/2023 2.3    Lymphs Abs 03/05/2023 1.3    Monocytes Absolute 03/05/2023 0.4    Eosinophils Absolute 03/05/2023 0.0    Basophils Absolute 03/05/2023 0.0    Sodium 03/05/2023 140    Potassium 03/05/2023 4.5    Chloride 03/05/2023 101    CO2 03/05/2023 30    Glucose, Bld 03/05/2023 74    BUN 03/05/2023 10    Creatinine, Ser 03/05/2023 0.72    GFR 03/05/2023 122.22    Calcium 03/05/2023 9.9    TSH 03/05/2023  0.98    Magnesium 03/05/2023 1.9   Admission on 01/30/2023, Discharged on 01/30/2023  Component Date Value   Sodium 01/30/2023 140    Potassium 01/30/2023 4.2    Chloride 01/30/2023 101    CO2 01/30/2023 27    Glucose, Bld 01/30/2023 108 (H)    BUN 01/30/2023 <5 (L)    Creatinine, Ser 01/30/2023 0.88    Calcium 01/30/2023 9.6    GFR, Estimated 01/30/2023 >60    Anion gap 01/30/2023 12   Clinical Support on 01/16/2023  Component Date Value   TB Skin Test 01/18/2023 Negative    Induration 01/18/2023 0   Lab on 01/16/2023  Component Date Value   Anti Nuclear Antibody (A* 01/16/2023 NEGATIVE    ds DNA Ab 01/16/2023 1    Scleroderma (Scl-70) (EN* 01/16/2023 <1.0 NEG    ENA SM Ab Ser-aCnc 01/16/2023 <1.0 NEG    SM/RNP 01/16/2023 <1.0 NEG    SSA (Ro) (ENA) Antibody,* 01/16/2023 <1.0 NEG    SSB (La) (ENA) Antibody,* 01/16/2023 <1.0 NEG    CRP 01/16/2023 <1.0    Sed Rate 01/16/2023 4    VITD 01/16/2023 24.94 (L)    Vitamin B-12 01/16/2023 524    Folate 01/16/2023 17.1    HIV 1&2 Ab, 4th Generati* 01/16/2023 NON-REACTIVE    Path Review 01/16/2023     D-Dimer, Quant 01/16/2023 <0.19   Appointment on 12/18/2022  Component Date Value   Area-P 1/2 12/18/2022 3.24    S' Lateral 12/18/2022 3.00    Est EF 12/18/2022 60 - 65%   Appointment on 12/18/2022  Component Date Value   TSH 12/18/2022 1.300    Cholesterol, Total 12/18/2022 158    Triglycerides 12/18/2022 53    HDL 12/18/2022 48    VLDL Cholesterol Cal 12/18/2022 11    LDL Chol Calc (NIH) 12/18/2022 99    Chol/HDL Ratio 12/18/2022 3.3    Glucose 12/18/2022 88    BUN 12/18/2022 4 (L)    Creatinine, Ser 12/18/2022 0.95    eGFR 12/18/2022 110    BUN/Creatinine Ratio 12/18/2022 4 (L)    Sodium 12/18/2022 144    Potassium 12/18/2022 4.0    Chloride 12/18/2022 103    CO2 12/18/2022 21    Calcium 12/18/2022 9.8    Total Protein 12/18/2022 7.1    Albumin 12/18/2022 4.8    Globulin, Total 12/18/2022 2.3    Albumin/Globulin  Ratio 12/18/2022 2.1    Bilirubin Total 12/18/2022 0.3    Alkaline Phosphatase 12/18/2022 55    AST 12/18/2022 14    ALT 12/18/2022 10    WBC 12/18/2022 3.0 (L)    RBC 12/18/2022  4.83    Hemoglobin 12/18/2022 14.1    Hematocrit 12/18/2022 40.5    MCV 12/18/2022 84    MCH 12/18/2022 29.2    MCHC 12/18/2022 34.8    RDW 12/18/2022 13.1    Platelets 12/18/2022 195    Neutrophils 12/18/2022 45    Lymphs 12/18/2022 43    Monocytes 12/18/2022 10    Eos 12/18/2022 1    Basos 12/18/2022 1    Neutrophils Absolute 12/18/2022 1.4    Lymphocytes Absolute 12/18/2022 1.3    Monocytes Absolute 12/18/2022 0.3    EOS (ABSOLUTE) 12/18/2022 0.0    Basophils Absolute 12/18/2022 0.0    Immature Granulocytes 12/18/2022 0    Immature Grans (Abs) 12/18/2022 0.0   Office Visit on 11/24/2022  Component Date Value   Sed Rate 11/24/2022 2    CRP 11/24/2022 6   Admission on 11/22/2022, Discharged on 11/22/2022  Component Date Value   Sodium 11/22/2022 137    Potassium 11/22/2022 4.1    Chloride 11/22/2022 103    CO2 11/22/2022 27    Glucose, Bld 11/22/2022 92    BUN 11/22/2022 5 (L)    Creatinine, Ser 11/22/2022 0.82    Calcium 11/22/2022 9.7    GFR, Estimated 11/22/2022 >60    Anion gap 11/22/2022 7    WBC 11/22/2022 4.1    RBC 11/22/2022 5.14    Hemoglobin 11/22/2022 14.4    HCT 11/22/2022 41.7    MCV 11/22/2022 81.1    MCH 11/22/2022 28.0    MCHC 11/22/2022 34.5    RDW 11/22/2022 12.5    Platelets 11/22/2022 216    nRBC 11/22/2022 0.0    Troponin I (High Sensiti* 11/22/2022 6    Sodium 11/22/2022 140    Potassium 11/22/2022 4.2    Chloride 11/22/2022 100    BUN 11/22/2022 <3 (L)    Creatinine, Ser 11/22/2022 0.80    Glucose, Bld 11/22/2022 89    Calcium, Ion 11/22/2022 1.26    TCO2 11/22/2022 26    Hemoglobin 11/22/2022 14.6    HCT 11/22/2022 43.0    Troponin I (High Sensiti* 11/22/2022 5   Clinical Support on 11/20/2022  Component Date Value   FVC-Pre 11/20/2022 4.84     FVC-%Pred-Pre 11/20/2022 94    FEV1-Pre 11/20/2022 3.98    FEV1-%Pred-Pre 11/20/2022 94    FEV6-Pre 11/20/2022 4.84    FEV6-%Pred-Pre 11/20/2022 95    Pre FEV1/FVC ratio 11/20/2022 82    FEV1FVC-%Pred-Pre 11/20/2022 100    Pre FEV6/FVC Ratio 11/20/2022 100    FEV6FVC-%Pred-Pre 11/20/2022 101    FEF 25-75 Pre 11/20/2022 3.97    FEF2575-%Pred-Pre 11/20/2022 92   There may be more visits with results that are not included.   No image results found.   CT Soft Tissue Neck W Contrast  Result Date: 04/12/2023 CLINICAL DATA:  Throat pain for 2 years EXAM: CT NECK WITH CONTRAST TECHNIQUE: Multidetector CT imaging of the neck was performed using the standard protocol following the bolus administration of intravenous contrast. RADIATION DOSE REDUCTION: This exam was performed according to the departmental dose-optimization program which includes automated exposure control, adjustment of the mA and/or kV according to patient size and/or use of iterative reconstruction technique. CONTRAST:  75mL OMNIPAQUE IOHEXOL 300 MG/ML  SOLN COMPARISON:  11/12/2022 FINDINGS: Pharynx and larynx: Normal. No mass or swelling. Salivary glands: No inflammation, mass, or stone. Thyroid: Normal. Lymph nodes: None enlarged or abnormal density. Vascular: Patent. Incidental note is made that the left vertebral artery originates from the  aorta, a normal variant. Limited intracranial: Negative. Visualized orbits: Negative. Mastoids and visualized paranasal sinuses: Clear. Skeleton: No acute fracture or suspicious osseous lesion. Upper chest: Imaged lungs are clear. Other: None. IMPRESSION: Normal CT of the neck. No findings to explain the patient's symptoms. Electronically Signed   By: Wiliam Ke M.D.   On: 04/12/2023 18:14   DG Chest Portable 1 View  Result Date: 04/12/2023 CLINICAL DATA:  Cough.  Sore throat. EXAM: PORTABLE CHEST 1 VIEW COMPARISON:  X-ray 03/29/2023 and older FINDINGS: The heart size and mediastinal contours  are within normal limits. Both lungs are clear. Consolidation, pneumothorax or effusion. No edema. The visualized skeletal structures are unremarkable. IMPRESSION: No acute cardiopulmonary disease. Electronically Signed   By: Karen Kays M.D.   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 No pneumothorax. The lungs are otherwise clear. No nodules or masses. The cardiomediastinal silhouette is otherwise normal. IMPRESSION: No active cardiopulmonary disease. Electronically Signed   By: Gerome Sam III M.D.   On: 03/31/2023 08:06       This encounter employed real-time, collaborative documentation. The patient actively reviewed and updated their medical record on a shared screen, ensuring transparency and facilitating joint problem-solving for the problem list, overview, and plan. This approach promotes accurate, informed care. The treatment plan was discussed and reviewed in detail, including medication safety, potential side effects, and all patient questions. We confirmed understanding and comfort with the plan. Follow-up instructions were established, including contacting the office for any concerns, returning if symptoms worsen, persist, or new symptoms develop, and precautions for potential emergency department visits. ----------------------------------------------------- [name], MD  05/16/2023 8:04 PM  [facility]:  [phone]

## 2023-05-16 NOTE — Assessment & Plan Note (Addendum)
Headache and Eye Pain: He exhibits symptoms likely indicative of post-concussive syndrome, with a normal eye evaluation and ineffective migraine medications. We encouraged patient to to start self-injection anti-migraine medication before his neurology appointment and consider Botox injections if this medication proves ineffective.  Updated problem overview for this problem to improve longitudinal management

## 2023-05-16 NOTE — Assessment & Plan Note (Signed)
Esophageal Dysmotility and Reflux: He reports difficulty swallowing and the sensation of something stuck in his throat. Gastroenterology at [facility] plan a repeat EGD with biopsies to rule out eosinophilic esophagitis, will increase his proton pump inhibitor dosage prior to the test, then stop it two weeks before the test. Manometry will be conducted during endoscopy. He should continue a soft diet, undergo EGD with biopsies and manometry, and try lidocaine viscous solution for symptom relief (we send in today)

## 2023-05-16 NOTE — Assessment & Plan Note (Signed)
Updated problem overview for this problem to improve longitudinal management  

## 2023-05-21 ENCOUNTER — Telehealth: Payer: Medicaid Other | Admitting: Internal Medicine

## 2023-05-21 ENCOUNTER — Encounter: Payer: Self-pay | Admitting: Internal Medicine

## 2023-05-21 DIAGNOSIS — G43709 Chronic migraine without aura, not intractable, without status migrainosus: Secondary | ICD-10-CM | POA: Diagnosis not present

## 2023-05-21 DIAGNOSIS — Z5900 Homelessness unspecified: Secondary | ICD-10-CM | POA: Diagnosis not present

## 2023-05-21 DIAGNOSIS — R1314 Dysphagia, pharyngoesophageal phase: Secondary | ICD-10-CM | POA: Diagnosis not present

## 2023-05-21 HISTORY — DX: Homelessness unspecified: Z59.00
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
## 2023-05-21 NOTE — Assessment & Plan Note (Deleted)
Headache: Their headache persists despite previous interventions. They will pick up prescribed Aimovig and amitriptyline from the pharmacy and consider trying Celebrex for potential inflammation.

## 2023-05-21 NOTE — Assessment & Plan Note (Deleted)
Dysphagia and Breathing Difficulty: Their symptoms persist despite previous interventions and worsen with stress and physical activity. They will continue with speech pathology exercises as recommended by ENT, try meditative relaxation, stretching exercises, and heat application to neck muscles. They need to pick up prescribed medications from the pharmacy including Aimovig, amitriptyline, lidocaine solution, Reglan solution, Gaviscon Extra Relief, and Mucomin, and consider trying Celebrex for potential inflammation. I  created elaborate AVS explanation for each medication(s), he has not picked up due to resources and needs guidance now on what does what.

## 2023-05-21 NOTE — Assessment & Plan Note (Deleted)
Homelessness: They are currently living in unstable housing conditions. We discussed various resources including the [facility], [employer], and [employer]. They will reach out to the [employer] for rapid rehousing and other services, contact [employer] and [employer] for potential shelter options, and schedule a follow-up appointment later in the week. Today I researched and created an entire homeless support resources handout and put it on my  chart for patient, see AVS. He assured me he has food stamps. I counseled him on safety and importance of navigating this intensively to avoid permanent homelessness. Social work is pending for Wednesday

## 2023-05-23 ENCOUNTER — Other Ambulatory Visit: Payer: Medicaid Other

## 2023-05-23 ENCOUNTER — Other Ambulatory Visit: Payer: Medicaid Other | Admitting: Licensed Clinical Social Worker
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
## 2023-05-24 ENCOUNTER — Telehealth: Payer: Self-pay

## 2023-05-24 DIAGNOSIS — Z419 Encounter for procedure for purposes other than remedying health state, unspecified: Secondary | ICD-10-CM | POA: Diagnosis not present

## 2023-05-24 NOTE — Patient Outreach (Signed)
BSW returned patients telephone call. He stated that he did contact some of the resources BSW provided but has not heard from them. He did try [facility] has well and the telephone number did not go through. BSW tried the telephone number and encouraged patient to try [facility] and the other resources.   Abelino Derrick, MHA [facility]  Managed [facility] Social Worker [phone]

## 2023-05-29 ENCOUNTER — Telehealth (HOSPITAL_COMMUNITY): Payer: Self-pay

## 2023-05-29 ENCOUNTER — Telehealth: Payer: Self-pay

## 2023-05-29 NOTE — Patient Outreach (Signed)
BSW receieved a telephone call that his is needing a letter stating that he is homeless. BSW completed letter stating patient is homeless and emailed to patient.   Abelino Derrick, MHA [facility]  Managed [facility] Social Worker [phone]
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
## 2023-05-31 ENCOUNTER — Telehealth (INDEPENDENT_AMBULATORY_CARE_PROVIDER_SITE_OTHER): Payer: Medicaid Other | Admitting: Internal Medicine

## 2023-05-31 ENCOUNTER — Encounter: Payer: Self-pay | Admitting: Internal Medicine

## 2023-05-31 DIAGNOSIS — R1314 Dysphagia, pharyngoesophageal phase: Secondary | ICD-10-CM | POA: Diagnosis not present

## 2023-05-31 DIAGNOSIS — R29818 Other symptoms and signs involving the nervous system: Secondary | ICD-10-CM | POA: Diagnosis not present

## 2023-05-31 DIAGNOSIS — F0781 Postconcussional syndrome: Secondary | ICD-10-CM | POA: Diagnosis not present

## 2023-05-31 DIAGNOSIS — Z5941 Food insecurity: Secondary | ICD-10-CM | POA: Diagnosis not present

## 2023-05-31 DIAGNOSIS — Z5982 Transportation insecurity: Secondary | ICD-10-CM | POA: Diagnosis not present

## 2023-05-31 DIAGNOSIS — Z5900 Homelessness unspecified: Secondary | ICD-10-CM | POA: Diagnosis not present

## 2023-05-31 NOTE — Assessment & Plan Note (Signed)
They are awaiting an endoscopy and manometry, currently scheduled for November. We will attempt to expedite these GI procedures and continue the current management plan.

## 2023-05-31 NOTE — Assessment & Plan Note (Addendum)
They are experiencing progressive symptoms from Post-Concussive Syndrome, affecting their ability to maintain full-time employment due to the need for unscheduled breaks. We will continue the current management plan and write a supportive note for their SSDI appeal, highlighting the progressive symptoms, necessity for unscheduled breaks, and potential for missed work days. He is planning to see Dr. Ferol Luz at [facility] but they are delayed til Jan. We will call and request sooner appointment due to progression of symptom(s) and social determinants of health crisis now.  He has medicaid transportation to attend.Medication Management They have pending pickups for self-injection medication for headaches and a nebulizer from the pharmacy. We encourage them to pick up these medications as soon as possible and consider changing the pharmacy location for easier access.

## 2023-05-31 NOTE — Assessment & Plan Note (Addendum)
They are facing housing instability and financial strain. We will maintain close contact with social worker Gus Puma for assistance with housing, food, and transportation resources. We suggest considering part-time work or to maintain income and exploring options for a salary advance loan or rental assistance.We have Chronic Care Management (CCM) social work following with patient and he is aware of resources Follow-up:  We will continue frequent phone call appointments to monitor their health status, scheduling the next appointment at their earliest convenience.
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
## 2023-05-31 NOTE — Patient Instructions (Signed)
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 We also talked about your gastrointestinal issues and the upcoming procedures you have scheduled. We acknowledged the social and financial challenges you're facing, including housing instability and financial strain. We also discussed the importance of picking up your prescribed medications from the pharmacy.  YOUR PLAN:  -POST-CONCUSSIVE SYNDROME: This is a condition that can cause a variety of symptoms, such as headaches and problems with concentration, memory, balance, and coordination, following a concussion. We will continue with the current treatment plan and I will write a supportive note for your SSDI appeal, emphasizing the progressive nature of your symptoms and the impact on your work.  -GASTROINTESTINAL ISSUES: These are problems related to your digestive system. We will try to speed up the scheduling of your endoscopy and manometry procedures and continue with the current treatment plan.  -SOCIAL DETERMINANTS OF HEALTH: These are factors in your environment that affect your health and wellbeing. We will stay in close contact with your social worker, Gus Puma, to help with housing, food, and transportation resources. 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  -MEDICATION MANAGEMENT: This involves making sure you're taking your medications as prescribed. We encourage you to pick up your self-injection medication for headaches and your nebulizer from the pharmacy as soon as possible. We also suggest considering changing the pharmacy location for easier access.  INSTRUCTIONS:  We will continue to have frequent phone call appointments to monitor your health status. Please  schedule the next appointment at your earliest convenience. Remember to pick up your medications from the pharmacy and consider changing the pharmacy location if it would make it easier for you to access your medications.

## 2023-06-01 ENCOUNTER — Ambulatory Visit (HOSPITAL_COMMUNITY): Payer: Medicaid Other | Admitting: Licensed Clinical Social Worker

## 2023-06-01 ENCOUNTER — Telehealth (HOSPITAL_COMMUNITY): Payer: Self-pay | Admitting: Licensed Clinical Social Worker

## 2023-06-01 NOTE — Telephone Encounter (Signed)
LCSW connected with pt for virtual appointment. LCSW explained services at [facility]. Pt asked if he could be seen weekly for individual therapy. At this time [facility] has a frequency for f/u appointment every 3 weeks. LCSW provided pt resources to [employer], [address], and [place] for f/u. LCSW did let pt if anything fell through he was welcome to re schedule. Pt was pleasant and agreeable to plan. Pt did opt out of [facility] schedule for today 06/01/23 and will be marked as left without being seen.

## 2023-06-03 DIAGNOSIS — Z7689 Persons encountering health services in other specified circumstances: Secondary | ICD-10-CM | POA: Diagnosis not present

## 2023-06-04 ENCOUNTER — Telehealth: Payer: Self-pay | Admitting: Internal Medicine

## 2023-06-04 NOTE — Telephone Encounter (Signed)
Patient requests to be called for status of SSDI Letter

## 2023-06-05 ENCOUNTER — Encounter: Payer: Self-pay | Admitting: Internal Medicine

## 2023-06-05 DIAGNOSIS — Z7689 Persons encountering health services in other specified circumstances: Secondary | ICD-10-CM | POA: Diagnosis not present

## 2023-06-05 DIAGNOSIS — F0781 Postconcussional syndrome: Secondary | ICD-10-CM | POA: Diagnosis not present

## 2023-06-05 DIAGNOSIS — M542 Cervicalgia: Secondary | ICD-10-CM | POA: Diagnosis not present

## 2023-06-05 NOTE — Telephone Encounter (Signed)
Pt called regarding an update on this letter.

## 2023-06-07 ENCOUNTER — Ambulatory Visit (INDEPENDENT_AMBULATORY_CARE_PROVIDER_SITE_OTHER): Payer: Medicaid Other | Admitting: Internal Medicine

## 2023-06-07 ENCOUNTER — Encounter: Payer: Self-pay | Admitting: Internal Medicine

## 2023-06-07 VITALS — BP 117/71 | HR 81 | Temp 98.4°F | Ht 68.0 in | Wt 124.6 lb

## 2023-06-07 DIAGNOSIS — Z5900 Homelessness unspecified: Secondary | ICD-10-CM

## 2023-06-07 DIAGNOSIS — R1314 Dysphagia, pharyngoesophageal phase: Secondary | ICD-10-CM

## 2023-06-07 DIAGNOSIS — Z59819 Housing instability, housed unspecified: Secondary | ICD-10-CM | POA: Diagnosis not present

## 2023-06-07 DIAGNOSIS — Z5941 Food insecurity: Secondary | ICD-10-CM

## 2023-06-07 DIAGNOSIS — Z5982 Transportation insecurity: Secondary | ICD-10-CM

## 2023-06-07 DIAGNOSIS — Z562 Threat of job loss: Secondary | ICD-10-CM

## 2023-06-07 DIAGNOSIS — Z7689 Persons encountering health services in other specified circumstances: Secondary | ICD-10-CM | POA: Diagnosis not present

## 2023-06-07 NOTE — Progress Notes (Signed)
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 bolus administration of intravenous contrast. RADIATION DOSE REDUCTION: This exam was performed according to the departmental dose-optimization program which includes automated exposure control, adjustment of the mA and/or kV according to patient size and/or use of iterative reconstruction technique. CONTRAST:  75mL OMNIPAQUE IOHEXOL 300 MG/ML  SOLN COMPARISON:  11/12/2022 FINDINGS: Pharynx and larynx: Normal. No mass or swelling. Salivary glands: No inflammation, mass, or stone. Thyroid: Normal. Lymph nodes: None enlarged or abnormal density. Vascular: Patent. Incidental note is made that the left vertebral artery originates from the aorta, a normal variant. Limited intracranial: Negative. Visualized orbits: Negative. Mastoids and visualized paranasal sinuses: Clear. Skeleton: No acute fracture or suspicious osseous lesion. Upper chest: Imaged lungs are clear. Other: None.  IMPRESSION: Normal CT of the neck. No findings to explain the patient's symptoms. Electronically Signed   By: Wiliam Ke M.D.   On: 04/12/2023 18:14   DG Chest Portable 1 View  Result Date: 04/12/2023 CLINICAL DATA:  Cough.  Sore throat. EXAM: PORTABLE CHEST 1 VIEW COMPARISON:  X-ray 03/29/2023 and older FINDINGS: The heart size and mediastinal contours are within normal limits. Both lungs are clear. Consolidation, pneumothorax or effusion. No edema. The visualized skeletal structures are unremarkable. IMPRESSION: No acute cardiopulmonary disease. Electronically Signed   By: Karen Kays M.D.   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 No pneumothorax. The lungs are otherwise clear. No nodules or masses. The cardiomediastinal silhouette is otherwise normal. IMPRESSION: No active cardiopulmonary disease. Electronically Signed   By: Gerome Sam III M.D.   On: 03/31/2023 08:06    CT Soft Tissue Neck W Contrast  Result Date: 04/12/2023 CLINICAL DATA:  Throat pain for 2 years EXAM: CT NECK WITH CONTRAST TECHNIQUE: Multidetector CT imaging of the neck was performed using the standard protocol following the bolus administration of intravenous contrast. RADIATION DOSE REDUCTION: This exam was performed according to the departmental dose-optimization program which includes automated exposure control, adjustment of the mA and/or kV according to patient size and/or use of iterative reconstruction technique. CONTRAST:  75mL OMNIPAQUE IOHEXOL 300 MG/ML  SOLN COMPARISON:  11/12/2022 FINDINGS: Pharynx and larynx: Normal. No mass or swelling. Salivary glands: No inflammation, mass, or stone. Thyroid: Normal. Lymph nodes: None enlarged or abnormal density. Vascular: Patent.  Incidental note is made that the left vertebral artery originates from the aorta, a normal variant. Limited intracranial: Negative. Visualized orbits: Negative. Mastoids and visualized paranasal sinuses: Clear. Skeleton: No acute fracture or suspicious osseous lesion. Upper chest: Imaged lungs are clear. Other: None. IMPRESSION: Normal CT of the neck. No findings to explain the  patient's symptoms. Electronically Signed   By: Wiliam Ke M.D.   On: 04/12/2023 18:14   DG Chest Portable 1 View  Result Date: 04/12/2023 CLINICAL DATA:  Cough.  Sore throat. EXAM: PORTABLE CHEST 1 VIEW COMPARISON:  X-ray 03/29/2023 and older FINDINGS: The heart size and mediastinal contours are within normal limits. Both lungs are clear. Consolidation, pneumothorax or effusion. No edema. The visualized skeletal structures are unremarkable. IMPRESSION: No acute cardiopulmonary disease. Electronically Signed   By: Karen Kays M.D.   On: 04/12/2023 17:15      This encounter employed real-time, collaborative documentation. The patient actively reviewed and updated their medical record on a shared screen, ensuring transparency and facilitating joint problem-solving for the problem list, overview, and plan. This approach promotes accurate, informed care. The treatment plan was discussed and reviewed in detail, including medication safety, potential side effects, and all patient questions. We confirmed understanding and comfort with the plan. Follow-up instructions were established, including contacting the office for any concerns, returning if symptoms worsen, persist, or new symptoms develop, and precautions for potential emergency department visits. ----------------------------------------------------- [name], MD  06/07/2023 9:09 PM  [facility]:  [phone]

## 2023-06-07 NOTE — Assessment & Plan Note (Addendum)
Scheduled for manometry and endoscopy next ThursdayHe is experiencing ongoing swallowing difficulties and is scheduled for manometry and endoscopy next week to further evaluate esophageal motility and potential causes. We will continue with the scheduled GI procedures and reach out to Dr. Ali Lowe to request an upright MRI of the brain and neck.

## 2023-06-07 NOTE — Patient Instructions (Addendum)
VISIT SUMMARY:  During our visit, we discussed your ongoing concerns about housing, food, and job security. We also talked about your health issues, specifically your swallowing difficulties and the upcoming procedures to investigate these. 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  YOUR PLAN:  -SWALLOWING DIFFICULTIES (DYSPHAGIA): This is a condition where you have trouble swallowing. We will continue with the scheduled procedures next week to further evaluate this issue. We will also request an upright MRI of the brain and neck.  -EMPLOYMENT AND FINANCIAL STABILITY: This refers to your current job situation and financial resources. We encouraged you to continue your job search and apply for positions. We will reach out to social worker Alric Quan for assistance with food options and job support, and you will recertify for food stamps.  -HOUSING STABILITY: This refers to your current living situation and potential future housing options. We will continue to work with the Child psychotherapist for potential housing options and resources.  -GENERAL HEALTH MAINTENANCE: This refers to your overall health and well-being. 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  INSTRUCTIONS:  Please continue to maintain your personal hygiene and cleanliness. 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 Continue your job search and apply for positions. Recertify for food stamps and reach out to social worker Alric Quan for assistance with food options and job support. Attend your scheduled procedures next week to further evaluate your swallowing  difficulties.  It was a pleasure seeing you today! Your health and satisfaction are our top priorities.   Glenetta Hew, MD  Next Steps:  [x]  Flexible Follow-Up: We recommend a follow up with your primary care, a specialist, or me within 1-2 weeks for ensuring your problem resolves. This allows for progress monitoring and treatment adjustments. [x]  Early Intervention: Schedule sooner appointment, call our on-call services, or go to emergency room if there is Increase in pain or discomfort New or worsening symptoms Sudden or severe changes in your health [x]  Lab & X-ray Appointments: complete or schedule to complete today, or call to schedule.  X-rays: [facility] (M-F, 8:30am-noon or 1pm-5pm).  Making the Most of Our Focused (20 minute) Follow Up Appointments:  [x]   Clearly state your top concerns at the beginning of the visit to focus our discussion [x]   If you anticipate you will need more time, please inform the front desk during scheduling - we can book multiple appointments in the same week. [x]   If you have transportation problems- use our convenient video appointments or ask about transportation support. [x]   We can get down to business faster if you use MyChart to update information before the visit and submit non-urgent questions before your visit. Thank you for taking the time to provide details through MyChart.  Let our nurse know and she can import this information into your encounter documents.  Arrival and Wait Times: [x]   Arriving on time ensures that everyone receives prompt attention. [x]   Early morning (8a) and afternoon (1p) appointments tend to have shortest wait times. [x]   Unfortunately, we cannot delay appointments for late arrivals or hold slots during phone calls.  Getting Answers and Following Up  [x]   Simple Questions & Concerns: For quick questions or basic follow-up after your visit, reach [place] at [phone] or MyChart messaging. [x]    Complex Concerns: If your concern is more complex, scheduling an appointment might be best. Discuss this with the staff to find the most suitable option. [x]   Lab & Imaging Results: We'll contact you directly if results are abnormal or you don't use MyChart. Most normal results will be on MyChart within 2-3 business days, with a review message from Dr. Jon Billings. Haven't heard back in 2 weeks? Need results sooner? Contact us at [phone]. [x]   Referrals: Our referral coordinator will manage specialist referrals. The specialist's office should contact you within 2 weeks to schedule an appointment. Call us if you haven't heard from them after 2 weeks.  Staying Connected  [x]   MyChart: Activate your MyChart for the fastest way to access results and message [place]. See the last page of this paperwork for instructions on how to activate.  Bring to Your Next Appointment  [x]   Medications: Please bring all your medication bottles to your next appointment to ensure we have an accurate record of your prescriptions. [x]   Health Diaries: If you're monitoring any health conditions at home, keeping a diary of your readings can be very helpful for discussions at your next appointment.  Billing  [x]   X-ray & Lab Orders: These are billed by separate companies. Contact the invoicing company directly for questions or concerns. [x]   Visit Charges: Discuss any billing inquiries with our administrative services team.  Your Satisfaction Matters  [x]   Share Your Experience: We strive for your satisfaction! If you have any complaints, or preferably compliments, please let Dr. Jon Billings know directly or contact our Practice Administrators, Edwena Felty or [employer], by asking at the front desk.   Reviewing Your Records  [x]   Review this early draft of your clinical encounter notes below and the final encounter summary tomorrow on MyChart after its been completed.   Housing insecurity  Homeless single  person  Lack of access to transportation  Food insecurity  Fear of job loss  Threat of job loss  Pharyngoesophageal dysphagia Assessment & Plan: Scheduled for manometry and endoscopy next Thursday

## 2023-06-07 NOTE — Assessment & Plan Note (Signed)
[place], and limited financial resources may impact his housing stability in the near future. We will continue to work with the Child psychotherapist for potential housing options and resources.

## 2023-06-07 NOTE — Telephone Encounter (Signed)
Patient has OV today with PCP  

## 2023-06-12 ENCOUNTER — Encounter: Payer: Self-pay | Admitting: Internal Medicine

## 2023-06-12 ENCOUNTER — Ambulatory Visit: Payer: Medicaid Other | Admitting: Internal Medicine

## 2023-06-12 VITALS — BP 100/70 | HR 68 | Temp 97.9°F | Ht 68.0 in | Wt 125.2 lb

## 2023-06-12 DIAGNOSIS — Z5900 Homelessness unspecified: Secondary | ICD-10-CM | POA: Diagnosis not present

## 2023-06-12 DIAGNOSIS — K224 Dyskinesia of esophagus: Secondary | ICD-10-CM | POA: Diagnosis not present

## 2023-06-12 DIAGNOSIS — F0781 Postconcussional syndrome: Secondary | ICD-10-CM | POA: Diagnosis not present

## 2023-06-12 DIAGNOSIS — Z7689 Persons encountering health services in other specified circumstances: Secondary | ICD-10-CM | POA: Diagnosis not present

## 2023-06-12 DIAGNOSIS — Z59819 Housing instability, housed unspecified: Secondary | ICD-10-CM | POA: Diagnosis not present
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
## 2023-06-12 NOTE — Assessment & Plan Note (Signed)
Esophageal Dysmotility The diagnosis of esophageal dysmotility, likely secondary to post-concussive syndrome, has been confirmed. A procedure is scheduled but may be canceled due to lack of transportation and support. We will attempt to secure transportation and support for the procedure through Medicaid or local community resources. If unsuccessful, we will communicate with the clinic to explore alternative plans.
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
## 2023-06-12 NOTE — Assessment & Plan Note (Signed)
  Social Determinants of Health Their current housing situation is temporary and unstable, with food insecurity exacerbated by limited income and dietary restrictions related to GI issues. 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 See AVS for extensive support provided to help him find solutions to get food/housing/transport. Printed it on AVS for patient and encouraged patient to call the numbers.

## 2023-06-15 ENCOUNTER — Ambulatory Visit: Payer: Medicaid Other | Admitting: Internal Medicine

## 2023-06-18 ENCOUNTER — Encounter: Payer: Self-pay | Admitting: Internal Medicine

## 2023-06-18 ENCOUNTER — Telehealth (INDEPENDENT_AMBULATORY_CARE_PROVIDER_SITE_OTHER): Payer: Medicaid Other | Admitting: Internal Medicine

## 2023-06-18 DIAGNOSIS — M1610 Unilateral primary osteoarthritis, unspecified hip: Secondary | ICD-10-CM | POA: Diagnosis not present

## 2023-06-18 DIAGNOSIS — R490 Dysphonia: Secondary | ICD-10-CM

## 2023-06-18 DIAGNOSIS — S14109D Unspecified injury at unspecified level of cervical spinal cord, subsequent encounter: Secondary | ICD-10-CM | POA: Diagnosis not present

## 2023-06-18 DIAGNOSIS — M24159 Other articular cartilage disorders, unspecified hip: Secondary | ICD-10-CM | POA: Diagnosis not present

## 2023-06-18 DIAGNOSIS — R1314 Dysphagia, pharyngoesophageal phase: Secondary | ICD-10-CM

## 2023-06-18 DIAGNOSIS — F0781 Postconcussional syndrome: Secondary | ICD-10-CM

## 2023-06-18 DIAGNOSIS — G959 Disease of spinal cord, unspecified: Secondary | ICD-10-CM | POA: Diagnosis not present

## 2023-06-18 DIAGNOSIS — Z59819 Housing instability, housed unspecified: Secondary | ICD-10-CM | POA: Diagnosis not present

## 2023-06-18 MED ORDER — CELECOXIB 200 MG PO CAPS
200.0000 mg | ORAL_CAPSULE | Freq: Two times a day (BID) | ORAL | 2 refills | Status: DC
Start: 2023-06-18 — End: 2023-08-31

## 2023-06-18 MED ORDER — AMITRIPTYLINE HCL 25 MG PO TABS: 25.0000 mg | ORAL_TABLET | Freq: Every evening | ORAL | 2 refills | Status: DC | PRN

## 2023-06-18 MED ORDER — BACLOFEN 10 MG PO TABS: 10.0000 mg | ORAL_TABLET | Freq: Three times a day (TID) | ORAL | 2 refills | Status: DC

## 2023-06-18 NOTE — Progress Notes (Signed)
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    Immature Granulocytes 12/18/2022 0    Immature Grans (Abs) 12/18/2022 0.0   Office Visit on 11/24/2022  Component Date Value   Sed Rate 11/24/2022 2    CRP 11/24/2022 6   Admission on 11/22/2022, Discharged on 11/22/2022  Component Date Value   Sodium 11/22/2022 137  Potassium 11/22/2022 4.1    Chloride 11/22/2022 103    CO2 11/22/2022 27    Glucose, Bld 11/22/2022 92    BUN 11/22/2022 5 (L)    Creatinine, Ser 11/22/2022 0.82    Calcium 11/22/2022 9.7    GFR, Estimated 11/22/2022 >60    Anion gap 11/22/2022 7    WBC 11/22/2022 4.1    RBC 11/22/2022 5.14    Hemoglobin 11/22/2022 14.4    HCT 11/22/2022 41.7    MCV 11/22/2022 81.1    MCH 11/22/2022 28.0    MCHC 11/22/2022 34.5    RDW 11/22/2022 12.5    Platelets 11/22/2022 216    nRBC 11/22/2022 0.0    Troponin I (High Sensiti* 11/22/2022 6    Sodium 11/22/2022 140    Potassium 11/22/2022 4.2    Chloride 11/22/2022 100    BUN 11/22/2022 <3 (L)     Creatinine, Ser 11/22/2022 0.80    Glucose, Bld 11/22/2022 89    Calcium, Ion 11/22/2022 1.26    TCO2 11/22/2022 26    Hemoglobin 11/22/2022 14.6    HCT 11/22/2022 43.0    Troponin I (High Sensiti* 11/22/2022 5   Clinical Support on 11/20/2022  Component Date Value   FVC-Pre 11/20/2022 4.84    FVC-%Pred-Pre 11/20/2022 94    FEV1-Pre 11/20/2022 3.98    FEV1-%Pred-Pre 11/20/2022 94    FEV6-Pre 11/20/2022 4.84    FEV6-%Pred-Pre 11/20/2022 95    Pre FEV1/FVC ratio 11/20/2022 82    FEV1FVC-%Pred-Pre 11/20/2022 100    Pre FEV6/FVC Ratio 11/20/2022 100    FEV6FVC-%Pred-Pre 11/20/2022 101    FEF 25-75 Pre 11/20/2022 3.97    FEF2575-%Pred-Pre 11/20/2022 92   There may be more visits with results that are not included.   No image results found.   CT Soft Tissue Neck W Contrast  Result Date: 04/12/2023 CLINICAL DATA:  Throat pain for 2 years EXAM: CT NECK WITH CONTRAST TECHNIQUE: Multidetector CT imaging of the neck was performed using the standard protocol following the bolus administration of intravenous contrast. RADIATION DOSE REDUCTION: This exam was performed according to the departmental dose-optimization program which includes automated exposure control, adjustment of the mA and/or kV according to patient size and/or use of iterative reconstruction technique. CONTRAST:  75mL OMNIPAQUE IOHEXOL 300 MG/ML  SOLN COMPARISON:  11/12/2022 FINDINGS: Pharynx and larynx: Normal. No mass or swelling. Salivary glands: No inflammation, mass, or stone. Thyroid: Normal. Lymph nodes: None enlarged or abnormal density. Vascular: Patent. Incidental note is made that the left vertebral artery originates from the aorta, a normal variant. Limited intracranial: Negative. Visualized orbits: Negative. Mastoids and visualized paranasal sinuses: Clear. Skeleton: No acute fracture or suspicious osseous lesion. Upper chest: Imaged lungs are clear. Other: None. IMPRESSION: Normal CT of the neck. No findings to explain  the patient's symptoms. Electronically Signed   By: Wiliam Ke M.D.   On: 04/12/2023 18:14   DG Chest Portable 1 View  Result Date: 04/12/2023 CLINICAL DATA:  Cough.  Sore throat. EXAM: PORTABLE CHEST 1 VIEW COMPARISON:  X-ray 03/29/2023 and older FINDINGS: The heart size and mediastinal contours are within normal limits. Both lungs are clear. Consolidation, pneumothorax or effusion. No edema. The visualized skeletal structures are unremarkable. IMPRESSION: No acute cardiopulmonary disease. Electronically Signed   By: Karen Kays M.D.   On: 04/12/2023 17:15   DG Chest 2 View  Result Date: 03/31/2023 CLINICAL DATA:  Shortness of breath EXAM: CHEST - 2 VIEW COMPARISON:  February 01, 2023 FINDINGS: Haziness along the  right heart border is a stable finding consistent with a pectus deformity identified on the lateral view. No pneumothorax. The lungs are otherwise clear. No nodules or masses. The cardiomediastinal silhouette is otherwise normal. IMPRESSION: No active cardiopulmonary disease. Electronically Signed   By: Gerome Sam III M.D.   On: 03/31/2023 08:06    CT Soft Tissue Neck W Contrast  Result Date: 04/12/2023 CLINICAL DATA:  Throat pain for 2 years EXAM: CT NECK WITH CONTRAST TECHNIQUE: Multidetector CT imaging of the neck was performed using the standard protocol following the bolus administration of intravenous contrast. RADIATION DOSE REDUCTION: This exam was performed according to the departmental dose-optimization program which includes automated exposure control, adjustment of the mA and/or kV according to patient size and/or use of iterative reconstruction technique. CONTRAST:  75mL OMNIPAQUE IOHEXOL 300 MG/ML  SOLN COMPARISON:  11/12/2022 FINDINGS: Pharynx and larynx: Normal. No mass or swelling. Salivary glands: No inflammation, mass, or stone. Thyroid: Normal. Lymph nodes: None enlarged or abnormal density. Vascular: Patent. Incidental note is made that the left vertebral artery  originates from the aorta, a normal variant. Limited intracranial: Negative. Visualized orbits: Negative. Mastoids and visualized paranasal sinuses: Clear. Skeleton: No acute fracture or suspicious osseous lesion. Upper chest: Imaged lungs are clear. Other: None. IMPRESSION: Normal CT of the neck. No findings to explain the patient's symptoms. Electronically Signed   By: Wiliam Ke M.D.   On: 04/12/2023 18:14   DG Chest Portable 1 View  Result Date: 04/12/2023 CLINICAL DATA:  Cough.  Sore throat. EXAM: PORTABLE CHEST 1 VIEW COMPARISON:  X-ray 03/29/2023 and older FINDINGS: The heart size and mediastinal contours are within normal limits. Both lungs are clear. Consolidation, pneumothorax or effusion. No edema. The visualized skeletal structures are unremarkable. IMPRESSION: No acute cardiopulmonary disease. Electronically Signed   By: Karen Kays M.D.   On: 04/12/2023 17:15      Additional Notes:   This encounter employed real-time, collaborative documentation. The patient actively reviewed and updated their medical record on a shared screen, ensuring transparency and facilitating joint problem-solving for the problem list, overview, and plan. This approach promotes accurate, informed care. The treatment plan was discussed and reviewed in detail, including medication safety, potential side effects, and all patient questions. We confirmed understanding and comfort with the plan. Follow-up instructions were established, including contacting the office for any concerns, returning if symptoms worsen, persist, or new symptoms develop, and precautions for potential emergency department visits. ----------------------------------------------------- [name], MD  06/18/2023 6:37 PM  [facility]:  [phone]

## 2023-06-18 NOTE — Assessment & Plan Note (Addendum)
They experience difficulty swallowing with a sensation of food getting stuck in their throat and are unable to undergo manometry due to lack of post-procedure supervision. We discussed potential solutions, including utilizing social work and [employer]. We will contact a Child psychotherapist for assistance with post-procedure supervision and reschedule manometry when supervision is secured.

## 2023-06-18 NOTE — Patient Instructions (Signed)
VISIT SUMMARY:  During your visit, we discussed your difficulty swallowing, persistent headaches, and hip pain due to a labral tear. We also talked about your complex social situation, including your strained family relationships, potential homelessness, and job Financial [employer]. We are working on solutions for these issues, including reaching out to a Child psychotherapist and other resources.  YOUR PLAN:  -DIFFICULTY SWALLOWING (DYSPHAGIA): This is a condition where you have trouble swallowing food or drink. We are working on getting you the necessary procedure to help with this. We will contact a social worker to help with supervision after the procedure, and we will reschedule the procedure once this is secured.  -PERSISTENT HEADACHES (POST-CONCUSSIVE SYNDROME): This is a condition where you have ongoing headaches and cognitive difficulties after a concussion. We have referred you to the [facility] for further evaluation and treatment. Please pick up your prescribed medications from the pharmacy and follow up with the clinic.  -HIP PAIN (HIP LABRAL TEAR): This is a condition where there is a tear in the ring of cartilage (labrum) that follows the outside rim of your hip joint socket. This can cause pain and difficulty walking. We have prescribed an anti-inflammatory medication, Celebrex, to help manage your pain. We may consider crushing the medication for easier ingestion.  INSTRUCTIONS:  Please continue your job search and work on securing stable housing. Utilize food stamps and other food sources to help reduce your food spending. Stay in touch with your social worker for ongoing support. We have scheduled a follow-up appointment for you next week.

## 2023-06-18 NOTE — Assessment & Plan Note (Signed)
They have persistent headaches and cognitive difficulties. A referral to [facility] is pending. They will pick up prescribed medications from the pharmacy and follow up with [facility].

## 2023-06-18 NOTE — Assessment & Plan Note (Signed)
They are advised to continue their job search and secure stable housing. They should utilize food stamps and additional food sources to reduce food spending. Ongoing support will be provided through continuous communication with a Child psychotherapist. A follow-up appointment is scheduled for next week. Spent 20 minutes help patient organize week to help prevent collapse to full homelessness... he is lucky that uncle letting him stay (briefly) at a building that is adequate. 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

## 2023-06-18 NOTE — Assessment & Plan Note (Addendum)
Hip Labral Tear They report pain with prolonged walking. We discussed the use of anti-inflammatory medication to manage pain. We will prescribe Celebrex for pain management and consider crushing the medication for easier ingestion.

## 2023-06-18 NOTE — Assessment & Plan Note (Addendum)
Extensive efforts to get advanced care have been (see incompleted artificial intelligence recommendations in overviews) unsuccessful due to social determinants of health.Marland Kitchen

## 2023-06-18 NOTE — Assessment & Plan Note (Signed)
In throat and esophagus, presumably due to postconcussive syndrome or spinal cord injury

## 2023-06-20 ENCOUNTER — Telehealth: Payer: Self-pay | Admitting: Internal Medicine

## 2023-06-20 ENCOUNTER — Ambulatory Visit: Payer: 59 | Admitting: Neurology

## 2023-06-20 NOTE — Telephone Encounter (Signed)
Patient currently has a telephone call set up with Gus Puma, Social worker, on 8/30 but he wants to know if there would be any other options for social workers.

## 2023-06-22 ENCOUNTER — Other Ambulatory Visit: Payer: Medicaid Other
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
## 2023-06-22 NOTE — Patient Instructions (Signed)
Visit Information  Mr. Stanbery was given information about Medicaid Managed Care team care coordination services as a part of their [facility] Medicaid benefit. [name] verbally consented to engagement with the [facility] Managed Care team.   If you are experiencing a medical emergency, please call 911 or report to your local emergency department or urgent care.   If you have a non-emergency medical problem during routine business hours, please contact your provider's office and ask to speak with a nurse.   For questions related to your [facility] health plan, please call: [phone] or go here:[email or web address]  If you would like to schedule transportation through your [facility] plan, please call the following number at least 2 days in advance of your appointment: [phone].   You can also use the MTM portal or MTM mobile app to manage your rides. Reimbursement for transportation is available through [facility]! For the portal, please go to [email or web address]  Call the [facility] Crisis Line at [phone], at any time, 24 hours a day, 7 days a week. If you are in danger or need immediate medical attention call 911.  If you would like help to quit smoking, call 1-800-QUIT-NOW (514-823-8891) OR Espaol: 1-855-Djelo-Ya (4-132-440-1027) o para ms informacin haga clic aqu or Text READY to 253-664 to register via text  Mr. Walle - following are the goals we discussed in your visit today:   Goals Addressed   None     Social Worker will follow up in 30 days.   Gus Puma, Kenard Gower, MHA [facility]  Managed Medicaid Social Worker [id number]   Following is a copy of your plan of care:  There are no care plans that you recently modified to display for this patient.

## 2023-06-24 DIAGNOSIS — Z7689 Persons encountering health services in other specified circumstances: Secondary | ICD-10-CM | POA: Diagnosis not present

## 2023-06-24 DIAGNOSIS — Z419 Encounter for procedure for purposes other than remedying health state, unspecified: Secondary | ICD-10-CM | POA: Diagnosis not present

## 2023-06-26 DIAGNOSIS — Z7689 Persons encountering health services in other specified circumstances: Secondary | ICD-10-CM | POA: Diagnosis not present

## 2023-06-27 ENCOUNTER — Telehealth (INDEPENDENT_AMBULATORY_CARE_PROVIDER_SITE_OTHER): Payer: Medicaid Other | Admitting: Internal Medicine

## 2023-06-27 ENCOUNTER — Encounter: Payer: Self-pay | Admitting: Internal Medicine

## 2023-06-27 DIAGNOSIS — K219 Gastro-esophageal reflux disease without esophagitis: Secondary | ICD-10-CM

## 2023-06-27 DIAGNOSIS — R636 Underweight: Secondary | ICD-10-CM

## 2023-06-27 DIAGNOSIS — Z5982 Transportation insecurity: Secondary | ICD-10-CM | POA: Diagnosis not present

## 2023-06-27 DIAGNOSIS — Z59819 Housing instability, housed unspecified: Secondary | ICD-10-CM

## 2023-06-27 DIAGNOSIS — R1314 Dysphagia, pharyngoesophageal phase: Secondary | ICD-10-CM

## 2023-06-27 DIAGNOSIS — R29818 Other symptoms and signs involving the nervous system: Secondary | ICD-10-CM | POA: Diagnosis not present

## 2023-06-27 DIAGNOSIS — M1990 Unspecified osteoarthritis, unspecified site: Secondary | ICD-10-CM

## 2023-06-27 DIAGNOSIS — K224 Dyskinesia of esophagus: Secondary | ICD-10-CM | POA: Diagnosis not present

## 2023-06-27 DIAGNOSIS — R252 Cramp and spasm: Secondary | ICD-10-CM | POA: Diagnosis not present

## 2023-06-27 DIAGNOSIS — F0781 Postconcussional syndrome: Secondary | ICD-10-CM | POA: Diagnosis not present

## 2023-06-27 DIAGNOSIS — G4489 Other headache syndrome: Secondary | ICD-10-CM | POA: Diagnosis not present

## 2023-06-27 DIAGNOSIS — R490 Dysphonia: Secondary | ICD-10-CM | POA: Diagnosis not present

## 2023-06-27 MED ORDER — GAVISCON EXTRA RELIEF FORMULA 508-475 MG/10ML PO SUSP: 10.0000 mL | Freq: Three times a day (TID) | ORAL | 0 refills | Status: DC

## 2023-06-27 MED ORDER — OMEPRAZOLE 20 MG PO CPDR
20.0000 mg | DELAYED_RELEASE_CAPSULE | Freq: Every day | ORAL | 11 refills | Status: DC
Start: 2023-06-27 — End: 2023-10-10

## 2023-06-27 MED ORDER — CELECOXIB 200 MG PO CAPS: 200.0000 mg | ORAL_CAPSULE | Freq: Two times a day (BID) | ORAL | 2 refills | Status: DC

## 2023-06-27 MED ORDER — DOXEPIN HCL 150 MG PO CAPS
150.0000 mg | ORAL_CAPSULE | Freq: Every day | ORAL | 1 refills | Status: DC
Start: 2023-06-27 — End: 2023-08-23

## 2023-06-27 MED ORDER — BACLOFEN 10 MG PO TABS
10.0000 mg | ORAL_TABLET | Freq: Three times a day (TID) | ORAL | 0 refills | Status: DC
Start: 2023-06-27 — End: 2023-10-10

## 2023-06-27 NOTE — Patient Instructions (Signed)
VISIT SUMMARY:  During our visit, we discussed your severe neck stiffness and headache, which you described as deep and intense. These symptoms have been affecting your daily activities and work. We also talked about your gastrointestinal symptoms, including frequent burping and a sensation of food getting stuck in your throat. We discussed your current employment and housing situation, and the need for a social support network to assist with your healthcare needs.  YOUR PLAN:  -POST-CONCUSSION SYNDROME: This is a complex disorder in which various symptoms, such as headaches and dizziness, continue for weeks and sometimes months after the injury that caused the concussion. You will start taking Celebrex as prescribed for the headache and neck pain. Please document your symptoms in detail for the upcoming neurology appointment.  This will help [place] to get you a more urgent appointment if you be extra clear specially about any progressive elements  -GASTROINTESTINAL SYMPTOMS: These are symptoms related to your digestive system, including burping, bloating, and possible acid reflux. You will start taking Gaviscon and Omeprazole as prescribed to help with these symptoms.  -EMPLOYMENT AND HOUSING INSECURITY: This refers to your current unstable employment and housing situation. You will continue to seek stable employment and housing and consider developing a social support network to assist with transportation and other needs.  INSTRUCTIONS:  Please pick up and start taking your prescribed medications - Celebrex for your headache and neck pain, and Gaviscon and Omeprazole for your gastrointestinal symptoms. Schedule weekly check-ins with me to monitor your progress and adjust treatment as needed. Attend your upcoming neurology appointment and discuss the possibility of an MRI. Continue to seek stable employment and housing, and consider reaching out to local churches and your mother for support.

## 2023-06-27 NOTE — Assessment & Plan Note (Signed)
Employment and Housing Insecurity Their current employment is part-time and unstable, with housing also being insecure. They will continue to seek stable employment and housing and consider developing a social support network to assist with transportation and other needs.

## 2023-06-27 NOTE — Assessment & Plan Note (Signed)
Gastrointestinal Symptoms They report burping, bloating, and possible acid reflux. They will pick up and start Gaviscon and Omeprazole as prescribed to help with these symptoms.

## 2023-06-27 NOTE — Assessment & Plan Note (Signed)
Post-Concussion Syndrome They report severe headache, neck stiffness, and dizziness, which are debilitating and affect daily living. They will pick up and start Celebrex as prescribed for the headache and neck pain. They will document symptoms in detail for the upcoming neurology appointment to make a strong case for expedited care.
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
## 2023-06-29 DIAGNOSIS — M542 Cervicalgia: Secondary | ICD-10-CM | POA: Diagnosis not present

## 2023-06-29 DIAGNOSIS — G43719 Chronic migraine without aura, intractable, without status migrainosus: Secondary | ICD-10-CM | POA: Diagnosis not present

## 2023-06-29 DIAGNOSIS — F0781 Postconcussional syndrome: Secondary | ICD-10-CM | POA: Diagnosis not present

## 2023-07-02 ENCOUNTER — Encounter: Payer: Self-pay | Admitting: Internal Medicine

## 2023-07-03 ENCOUNTER — Ambulatory Visit: Payer: Medicaid Other | Admitting: Internal Medicine

## 2023-07-04 ENCOUNTER — Telehealth (INDEPENDENT_AMBULATORY_CARE_PROVIDER_SITE_OTHER): Payer: Medicaid Other | Admitting: Internal Medicine

## 2023-07-04 ENCOUNTER — Encounter: Payer: Self-pay | Admitting: Internal Medicine

## 2023-07-04 DIAGNOSIS — G44329 Chronic post-traumatic headache, not intractable: Secondary | ICD-10-CM

## 2023-07-04 DIAGNOSIS — Z5982 Transportation insecurity: Secondary | ICD-10-CM

## 2023-07-04 DIAGNOSIS — Z560 Unemployment, unspecified: Secondary | ICD-10-CM | POA: Diagnosis not present

## 2023-07-04 DIAGNOSIS — M1610 Unilateral primary osteoarthritis, unspecified hip: Secondary | ICD-10-CM | POA: Diagnosis not present

## 2023-07-04 DIAGNOSIS — Z59819 Housing instability, housed unspecified: Secondary | ICD-10-CM | POA: Diagnosis not present

## 2023-07-04 DIAGNOSIS — F0781 Postconcussional syndrome: Secondary | ICD-10-CM | POA: Diagnosis not present
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
## 2023-07-04 NOTE — Patient Instructions (Addendum)
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 We also talked about your upcoming MRI and your current housing situation. We have outlined a plan to manage your symptoms and assist you with your housing needs.  YOUR PLAN:  -POST-CONCUSSIVE SYNDROME: Post-Concussive Syndrome is a condition that can occur after a concussion, causing headaches and other neurological symptoms. We are considering a medication called Aimovig to help manage your headaches. We will also try to speed up your appointment with the [facility] post-concussive clinic.  -HIP ARTHRITIS: Arthritis in your hip is causing you pain. You are currently taking pain medication regularly, but we are not sure how effective it is. We will continue with this treatment and monitor if your pain improves.  -UPCOMING MRIS: You have an MRI of your cervical spine scheduled, and we may also do an MRI of your brain. We will try to coordinate these procedures to avoid having to sedate you multiple times. We will discuss this further at your upcoming neurosurgery appointment.  -HOUSING INSECURITY: You are currently facing potential housing instability. We have recommended that you contact social worker Gus Puma for assistance with HUD housing. If you do not receive a response, you can also use resources like calling 211 for help.  INSTRUCTIONS:  Please follow up in 1 week to assess your progress and address any new concerns. In the meantime, continue with your current medication regimen for your post-concussive syndrome and hip arthritis. Also, remember to contact Gus Puma for assistance with your housing situation.   # [employer]  If you're experiencing homelessness or at risk of becoming homeless, these resources can help. Each organization offers different services, so consider contacting multiple options for comprehensive assistance.  ##  Immediate Assistance and Day Services  ### Loss adjuster, chartered [facility]) - **Phone:** [phone] - **Address:** [address] [place], [place] [address] - **Hours:** Monday-Friday (call for exact hours) - **Best for:** Immediate daytime assistance, housing referrals, job search help, and basic needs (mail/phone/shower/laundry/healthcare) - **Services:** Day shelter, housing assistance, job search, IT trainer, showers, Pharmacologist, healthcare, case management  ## Emergency Shelter and Housing Assistance  ### [employer] - **Phone:** [phone] - **Best for:** Emergency shelter and immediate housing needs - Research scientist (physical sciences):** Emergency assistance, shelter, and housing support  ### [employer] [place] - **Phone:** [phone] - **Best for:** Emergency shelter and transitional housing - **Services:** Emergency shelter and transitional housing programs  ## Housing Counseling and Long-term Solutions  ### [employer] - **Phone:** [phone] - **Best for:** Finding affordable housing options and housing counseling - **Services:** Housing counseling, assistance in finding affordable housing  ### [facility] - **Phone:** [phone] - **Best for:** Coordinated access to homeless services in [place] - **Services:** Information and referrals to various homeless services  ## General Assistance and Referrals  ### [employer] [place] [facility] - **Phone:** Dial 211 - **Best for:** Comprehensive information and referrals to various social services - **Services:** Connects individuals to housing and other social services  ### [facility] Department of Social Services - **Phone:** [phone] - **Best for:** Information on government assistance programs - **Services:** Information on emergency housing assistance and other social services  ## Additional Resources  ### [facility] [place] - **Phone:** [phone] - **Best for:** Resources and programs focused on preventing homelessness - **Services:** Works to prevent and end homelessness in the area  ###  [employer] - **Phone:** [phone] - **Best for:** Long-term affordable housing solutions - **Services:** Public housing and Section 8 voucher programs  ### [facility] [place] - **Phone:** [phone] - **Best for:** Domestic violence victims needing shelter - **Services:** Emergency shelter for domestic violence victims, counseling  ## Important Tips: 1. Be prepared to provide basic information about your situation when calling. 2. Services and availability may vary, so it's best to contact multiple organizations. 3. If you're in immediate danger or have a medical emergency, call 911. 4. Keep this list handy and don't hesitate to reach out for help. 5. Many of these services can also provide or connect you with resources for food, healthcare, and job assistance.  Remember, seeking help is a sign of strength. These organizations are here to support you through this challenging time.   # [employer]  If you're experiencing homelessness or at risk of becoming homeless, these resources can help. Each organization offers different services, so consider contacting multiple options for comprehensive assistance.  ## Immediate Assistance and Day Services  ### Loss adjuster, chartered [facility]) - **Phone:** [phone] - **Address:** [address], [place], [place] [address] - **Hours:** Monday-Friday (call for exact hours) - **Best for:** Immediate daytime assistance, housing referrals, job search help, and basic needs (mail/phone/shower/laundry/healthcare) - **Services:** Day shelter, housing assistance, job search, IT trainer, showers, Pharmacologist, healthcare, case management  ## Emergency Shelter and Housing  Assistance  ### [employer] - **Phone:** [phone] - **Best for:** Emergency shelter and immediate housing needs - Research scientist (physical sciences):** Emergency assistance, shelter, and housing support  ### [employer] [place] - **Phone:** [phone] - **Best for:** Emergency shelter and transitional housing - **Services:** Emergency shelter and transitional housing programs  ## Housing Counseling and Long-term Solutions  ### [employer] - **Phone:** [phone] - **Best for:** Finding affordable housing options and housing counseling - **Services:** Housing counseling, assistance in finding affordable housing  ### [facility] - **Phone:** [phone] - **Best for:** Coordinated access to homeless services in [place] - **Services:** Information and referrals to various homeless services  ## General Assistance and Referrals  ### [employer] [place] 211 - **Phone:** Dial 211 - **Best for:** Comprehensive information and referrals to various social services - **Services:** Connects individuals to housing and other social services  ### [facility] Department of Social Services - **Phone:** [phone] - **Best for:** Information on government assistance programs - **Services:** Information on emergency housing assistance and other social services  ## Additional Resources  ### [facility] [place] - **Phone:** [phone] - **Best for:** Resources and programs focused on preventing homelessness - **Services:** Works to prevent and end homelessness in the area  ### [employer] - **Phone:** [phone] - **Best for:** Long-term affordable housing solutions - **Services:** Public housing and Section 8 voucher programs  ### [facility] [place] - **Phone:** [phone] - **Best for:** Domestic violence victims needing shelter -  **Services:** Emergency shelter for domestic violence victims, counseling  ## Important Tips: 1. Be prepared to provide basic information about your situation when calling. 2. Services and availability may vary, so it's best to contact multiple organizations. 3. If you're in immediate danger or have a medical emergency, call 911. 4. Keep this list handy and don't hesitate to reach out for help. 5. Many of these services can also provide or connect you with resources for food, healthcare, and job  assistance.  Remember, seeking help is a sign of strength. These organizations are here to support you through this challenging time.

## 2023-07-04 NOTE — Assessment & Plan Note (Signed)
We reviewed his ongoing struggles to get treatment(s) that are helpful (celecoxib helps headache(s) a little) We discussed the persistent headaches and other neurological symptoms indicative of Post-Concussive Syndrome, considering the potential benefit of Aimovig for headache management. Efforts will be made to expedite their appointment with the [facility] post-concussive clinic and continue pursuing an Aimovig prescription with Dr. Mikael Spray neuroophthalmologist who evaluated eyes and no problems found They are scheduled for a sedated MRI of the cervical spine and potentially the brain. We discussed coordinating these procedures to minimize multiple sedation events and will discuss adding a brain MRI to the scheduled cervical spine MRI during the upcoming neurosurgery appointment. Patient request me to discuss with prior authorization  [id number]  1804 chaplain Will share data supporting postconcussive syndrome and encourage sooner visit to help with critical social determinants of health.  No water and leak in current building he is living in.
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
## 2023-07-04 NOTE — Assessment & Plan Note (Signed)
>>  ASSESSMENT AND PLAN FOR POST-TRAUMATIC HEADACHE, NOT INTRACTABLE WRITTEN ON 07/04/2023  8:02 PM BY Datha Kissinger G, MD  Celebrex  picked up and helping somewhat has been unable to get Aimovig  as recommended by Dr. Glenard Lander neurologist at [facility] [place], [place] and he reports - but they couldn't get upright MRI within their system She is assisting with getting postconcussive clinic increased involvement Traumatic brain injury / postconcussive may not be within Dr. Arta Bihari She advised patient to follow up neurosurgery but prior neurosurgeons have left- though he has an appointment on Tuesday 6 days from now with a neurosurgeon [facility] Heintschel

## 2023-07-10 ENCOUNTER — Ambulatory Visit: Payer: Medicaid Other | Admitting: Internal Medicine

## 2023-07-10 DIAGNOSIS — M542 Cervicalgia: Secondary | ICD-10-CM | POA: Diagnosis not present

## 2023-07-10 DIAGNOSIS — R519 Headache, unspecified: Secondary | ICD-10-CM | POA: Diagnosis not present

## 2023-07-10 DIAGNOSIS — G44321 Chronic post-traumatic headache, intractable: Secondary | ICD-10-CM | POA: Diagnosis not present

## 2023-07-10 DIAGNOSIS — G4486 Cervicogenic headache: Secondary | ICD-10-CM | POA: Diagnosis not present

## 2023-07-10 DIAGNOSIS — Z7689 Persons encountering health services in other specified circumstances: Secondary | ICD-10-CM | POA: Diagnosis not present

## 2023-07-10 DIAGNOSIS — F0781 Postconcussional syndrome: Secondary | ICD-10-CM | POA: Diagnosis not present

## 2023-07-12 ENCOUNTER — Encounter: Payer: Self-pay | Admitting: Internal Medicine

## 2023-07-16 ENCOUNTER — Encounter: Payer: Self-pay | Admitting: Internal Medicine

## 2023-07-16 ENCOUNTER — Ambulatory Visit (INDEPENDENT_AMBULATORY_CARE_PROVIDER_SITE_OTHER): Payer: Medicaid Other | Admitting: Internal Medicine

## 2023-07-16 VITALS — BP 112/72 | HR 68 | Temp 98.2°F | Ht 68.0 in | Wt 127.8 lb

## 2023-07-16 DIAGNOSIS — K224 Dyskinesia of esophagus: Secondary | ICD-10-CM

## 2023-07-16 DIAGNOSIS — R110 Nausea: Secondary | ICD-10-CM

## 2023-07-16 DIAGNOSIS — Z59819 Housing instability, housed unspecified: Secondary | ICD-10-CM | POA: Diagnosis not present

## 2023-07-16 DIAGNOSIS — E639 Nutritional deficiency, unspecified: Secondary | ICD-10-CM | POA: Diagnosis not present

## 2023-07-16 DIAGNOSIS — G44329 Chronic post-traumatic headache, not intractable: Secondary | ICD-10-CM

## 2023-07-16 DIAGNOSIS — F0781 Postconcussional syndrome: Secondary | ICD-10-CM | POA: Diagnosis not present

## 2023-07-16 DIAGNOSIS — R5383 Other fatigue: Secondary | ICD-10-CM

## 2023-07-16 DIAGNOSIS — R5381 Other malaise: Secondary | ICD-10-CM

## 2023-07-16 MED ORDER — ALIVE MENS GUMMY MULTIVITAMINS PO CHEW
1.0000 | CHEWABLE_TABLET | Freq: Every day | ORAL | 3 refills | Status: DC
Start: 2023-07-16 — End: 2023-10-10

## 2023-07-16 MED ORDER — ONDANSETRON 4 MG PO TBDP: 4.0000 mg | ORAL_TABLET | Freq: Three times a day (TID) | ORAL | 2 refills | Status: DC | PRN

## 2023-07-16 NOTE — Assessment & Plan Note (Signed)
Chronic Headaches Constant aching and pain in the back of the head and scalp, with associated tightness and pressure in the eyes. Partial relief with Celebrex. -Continue Celebrex as tolerated. -Consider starting Aimovig for migraines, patient to decide when ready.  Think you can

## 2023-07-16 NOTE — Assessment & Plan Note (Signed)
Patient faces significant housing insecurity has not been able to afford stable rent and reasonable living accommodations for months now but has been working with social work and encouraged him to continue doing so and I provided all the resources that I am aware of
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
## 2023-07-16 NOTE — Assessment & Plan Note (Signed)
>>  ASSESSMENT AND PLAN FOR POST-TRAUMATIC HEADACHE, NOT INTRACTABLE WRITTEN ON 07/16/2023  7:56 PM BY Lauranne Beyersdorf G, MD  Chronic Headaches Constant aching and pain in the back of the head and scalp, with associated tightness and pressure in the eyes. Partial relief with Celebrex . -Continue Celebrex  as tolerated. -Consider starting Aimovig  for migraines, patient to decide when ready.  Think you can
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
## 2023-07-16 NOTE — Assessment & Plan Note (Signed)
Esophageal Dysphagia Difficulty swallowing both solids and liquids, with sensation of food getting stuck. Associated with coughing. -Refer to GI for further evaluation. -Continue to consume nutrition drinks and soft, watered-down foods as tolerated.

## 2023-07-16 NOTE — Assessment & Plan Note (Signed)
General Health Maintenance / Followup Plans -Order standard fatigue workup labs. -Consider CT scan of sinuses to evaluate for chronic sinus infection, pending insurance approval. -Follow-up appointment in 1-2 weeks.
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
## 2023-07-17 DIAGNOSIS — F0781 Postconcussional syndrome: Secondary | ICD-10-CM | POA: Diagnosis not present

## 2023-07-17 DIAGNOSIS — H5713 Ocular pain, bilateral: Secondary | ICD-10-CM | POA: Diagnosis not present

## 2023-07-17 LAB — URINALYSIS, ROUTINE W REFLEX MICROSCOPIC
Bilirubin Urine: NEGATIVE
Hgb urine dipstick: NEGATIVE
Ketones, ur: NEGATIVE
Leukocytes,Ua: NEGATIVE
Nitrite: NEGATIVE
RBC / HPF: NONE SEEN (ref 0–?)
Specific Gravity, Urine: 1.025 (ref 1.000–1.030)
Total Protein, Urine: NEGATIVE
Urine Glucose: NEGATIVE
Urobilinogen, UA: 0.2 (ref 0.0–1.0)
pH: 6.5 (ref 5.0–8.0)

## 2023-07-17 LAB — CBC WITH DIFFERENTIAL/PLATELET
Basophils Absolute: 0.1 10*3/uL (ref 0.0–0.1)
Basophils Relative: 1.2 % (ref 0.0–3.0)
Eosinophils Absolute: 0 10*3/uL (ref 0.0–0.7)
Eosinophils Relative: 0.7 % (ref 0.0–5.0)
HCT: 43.9 % (ref 39.0–52.0)
Hemoglobin: 14.4 g/dL (ref 13.0–17.0)
Lymphocytes Relative: 29.4 % (ref 12.0–46.0)
Lymphs Abs: 1.5 10*3/uL (ref 0.7–4.0)
MCHC: 32.9 g/dL (ref 30.0–36.0)
MCV: 85.7 fl (ref 78.0–100.0)
Monocytes Absolute: 0.4 10*3/uL (ref 0.1–1.0)
Monocytes Relative: 8.1 % (ref 3.0–12.0)
Neutro Abs: 3.1 10*3/uL (ref 1.4–7.7)
Neutrophils Relative %: 60.6 % (ref 43.0–77.0)
Platelets: 181 10*3/uL (ref 150.0–400.0)
RBC: 5.13 Mil/uL (ref 4.22–5.81)
RDW: 13 % (ref 11.5–15.5)
WBC: 5.1 10*3/uL (ref 4.0–10.5)

## 2023-07-17 LAB — SEDIMENTATION RATE: Sed Rate: 15 mm/hr (ref 0–15)

## 2023-07-17 LAB — COMPREHENSIVE METABOLIC PANEL
ALT: 13 U/L (ref 0–53)
AST: 20 U/L (ref 0–37)
Albumin: 4.7 g/dL (ref 3.5–5.2)
Alkaline Phosphatase: 62 U/L (ref 39–117)
BUN: 17 mg/dL (ref 6–23)
CO2: 32 mEq/L (ref 19–32)
Calcium: 10.1 mg/dL (ref 8.4–10.5)
Chloride: 102 mEq/L (ref 96–112)
Creatinine, Ser: 0.91 mg/dL (ref 0.40–1.50)
GFR: 112.46 mL/min (ref >60.00)
Glucose, Bld: 78 mg/dL (ref 70–99)
Potassium: 4.8 mEq/L (ref 3.5–5.1)
Sodium: 143 mEq/L (ref 135–145)
Total Bilirubin: 0.3 mg/dL (ref 0.2–1.2)
Total Protein: 7.8 g/dL (ref 6.0–8.3)

## 2023-07-17 LAB — FOLATE: Folate: >24.2 ng/mL (ref >5.9)

## 2023-07-17 LAB — VITAMIN B12: Vitamin B-12: 587 pg/mL (ref 211–911)

## 2023-07-17 LAB — TSH: TSH: 2.35 u[IU]/mL (ref 0.35–5.50)

## 2023-07-18 DIAGNOSIS — F0781 Postconcussional syndrome: Secondary | ICD-10-CM | POA: Diagnosis not present

## 2023-07-18 DIAGNOSIS — G44309 Post-traumatic headache, unspecified, not intractable: Secondary | ICD-10-CM | POA: Diagnosis not present

## 2023-07-19 ENCOUNTER — Ambulatory Visit: Payer: Medicaid Other | Admitting: Internal Medicine

## 2023-07-19 DIAGNOSIS — Z79899 Other long term (current) drug therapy: Secondary | ICD-10-CM | POA: Diagnosis not present

## 2023-07-19 DIAGNOSIS — Z7689 Persons encountering health services in other specified circumstances: Secondary | ICD-10-CM | POA: Diagnosis not present

## 2023-07-19 DIAGNOSIS — M542 Cervicalgia: Secondary | ICD-10-CM | POA: Diagnosis not present

## 2023-07-19 DIAGNOSIS — S069XAD Unspecified intracranial injury with loss of consciousness status unknown, subsequent encounter: Secondary | ICD-10-CM | POA: Diagnosis not present

## 2023-07-19 DIAGNOSIS — F0781 Postconcussional syndrome: Secondary | ICD-10-CM | POA: Diagnosis not present

## 2023-07-19 DIAGNOSIS — G44329 Chronic post-traumatic headache, not intractable: Secondary | ICD-10-CM | POA: Diagnosis not present

## 2023-07-20 DIAGNOSIS — M542 Cervicalgia: Secondary | ICD-10-CM | POA: Diagnosis not present

## 2023-07-20 DIAGNOSIS — G43719 Chronic migraine without aura, intractable, without status migrainosus: Secondary | ICD-10-CM | POA: Diagnosis not present

## 2023-07-20 DIAGNOSIS — F0781 Postconcussional syndrome: Secondary | ICD-10-CM | POA: Diagnosis not present

## 2023-07-23 ENCOUNTER — Ambulatory Visit: Payer: Medicaid Other | Admitting: Internal Medicine

## 2023-07-23 ENCOUNTER — Other Ambulatory Visit: Payer: Medicaid Other

## 2023-07-23 DIAGNOSIS — R1312 Dysphagia, oropharyngeal phase: Secondary | ICD-10-CM | POA: Diagnosis not present

## 2023-07-23 DIAGNOSIS — F0781 Postconcussional syndrome: Secondary | ICD-10-CM | POA: Diagnosis not present

## 2023-07-23 DIAGNOSIS — R490 Dysphonia: Secondary | ICD-10-CM | POA: Diagnosis not present
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
## 2023-07-23 NOTE — Patient Instructions (Signed)
Visit Information  Nicholas Johnson was given information about Medicaid Managed Care team care coordination services as a part of their [facility] Medicaid benefit. Nicholas Johnson. verbally consented to engagement with the [facility] Managed Care team.   If you are experiencing a medical emergency, please call 911 or report to your local emergency department or urgent care.   If you have a non-emergency medical problem during routine business hours, please contact your provider's office and ask to speak with a nurse.   For questions related to your [facility] health plan, please call: [phone] or go here:[email or web address]  If you would like to schedule transportation through your [facility] plan, please call the following number at least 2 days in advance of your appointment: [phone].   You can also use the MTM portal or MTM mobile app to manage your rides. Reimbursement for transportation is available through [facility]! For the portal, please go to [email or web address]  Call the [facility] Crisis Line at [phone], at any time, 24 hours a day, 7 days a week. If you are in danger or need immediate medical attention call 911.  If you would like help to quit smoking, call 1-800-QUIT-NOW (817 288 3147) OR Espaol: 1-855-Djelo-Ya (4-132-440-1027) o para ms informacin haga clic aqu or Text READY to 253-664 to register via text  Nicholas Johnson - following are the goals we discussed in your visit today:   Goals Addressed   None      Social Worker will follow up on 08/21/23.   Gus Puma, Kenard Gower, MHA [facility]  Managed Medicaid Social Worker [phone]   Following is a copy of your plan of care:  There are no care plans that you recently modified to display for this patient.

## 2023-07-25 ENCOUNTER — Ambulatory Visit: Payer: Medicaid Other | Admitting: Internal Medicine

## 2023-07-27 ENCOUNTER — Encounter: Payer: Self-pay | Admitting: Internal Medicine

## 2023-07-27 ENCOUNTER — Ambulatory Visit (INDEPENDENT_AMBULATORY_CARE_PROVIDER_SITE_OTHER): Payer: Medicaid Other | Admitting: Internal Medicine

## 2023-07-27 VITALS — BP 118/75 | HR 69 | Temp 98.0°F | Ht 68.0 in | Wt 129.4 lb

## 2023-07-27 DIAGNOSIS — R1314 Dysphagia, pharyngoesophageal phase: Secondary | ICD-10-CM | POA: Diagnosis not present

## 2023-07-27 DIAGNOSIS — J320 Chronic maxillary sinusitis: Secondary | ICD-10-CM

## 2023-07-27 DIAGNOSIS — G43719 Chronic migraine without aura, intractable, without status migrainosus: Secondary | ICD-10-CM | POA: Diagnosis not present

## 2023-07-27 DIAGNOSIS — F0781 Postconcussional syndrome: Secondary | ICD-10-CM | POA: Diagnosis not present

## 2023-07-27 DIAGNOSIS — Z59819 Housing instability, housed unspecified: Secondary | ICD-10-CM | POA: Diagnosis not present

## 2023-07-27 DIAGNOSIS — Z7689 Persons encountering health services in other specified circumstances: Secondary | ICD-10-CM | POA: Diagnosis not present

## 2023-07-27 DIAGNOSIS — G44329 Chronic post-traumatic headache, not intractable: Secondary | ICD-10-CM

## 2023-07-27 MED ORDER — SUMATRIPTAN SUCCINATE 6 MG/0.5ML ~~LOC~~ SOLN: 6.0000 mg | Freq: Every day | SUBCUTANEOUS | 0 refills | Status: DC | PRN

## 2023-07-27 MED ORDER — AMOXICILLIN-POT CLAVULANATE 600-42.9 MG/5ML PO SUSR
600.0000 mg | Freq: Three times a day (TID) | ORAL | 0 refills | Status: DC
Start: 2023-07-27 — End: 2023-10-10

## 2023-07-27 NOTE — Assessment & Plan Note (Signed)
>>  ASSESSMENT AND PLAN FOR POST-TRAUMATIC HEADACHE, NOT INTRACTABLE WRITTEN ON 07/27/2023  4:27 PM BY Maurie Olesen G, MD  We will start Augmentin  for suspected chronic sinusitis and consider an injectable migraine medication due to persistent headaches with eye pain, which has not been relieved by Aimovig . Despite concerns about nasal sprays due to chronic sinus issues, we discussed other injectable options and nasal sprays for acute migraines. We will refer him to an allergist for potential allergy  testing and follow up in 1-2 weeks to assess his response to antibiotic treatment.

## 2023-07-27 NOTE — Assessment & Plan Note (Signed)
We will continue to provide support and resources where possible for him, currently unemployed and living in potentially unhealthy conditions, including mold exposure.
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
## 2023-07-27 NOTE — Assessment & Plan Note (Signed)
We will start Augmentin for suspected chronic sinusitis and consider an injectable migraine medication due to persistent headaches with eye pain, which has not been relieved by Aimovig. Despite concerns about nasal sprays due to chronic sinus issues, we discussed other injectable options and nasal sprays for acute migraines. We will refer him to an allergist for potential allergy testing and follow up in 1-2 weeks to assess his response to antibiotic treatment.
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
## 2023-07-30 DIAGNOSIS — G44329 Chronic post-traumatic headache, not intractable: Secondary | ICD-10-CM | POA: Diagnosis not present

## 2023-07-30 DIAGNOSIS — R278 Other lack of coordination: Secondary | ICD-10-CM | POA: Diagnosis not present

## 2023-07-30 DIAGNOSIS — S069XAD Unspecified intracranial injury with loss of consciousness status unknown, subsequent encounter: Secondary | ICD-10-CM | POA: Diagnosis not present

## 2023-07-30 DIAGNOSIS — Z789 Other specified health status: Secondary | ICD-10-CM | POA: Diagnosis not present

## 2023-07-30 DIAGNOSIS — M542 Cervicalgia: Secondary | ICD-10-CM | POA: Diagnosis not present

## 2023-08-02 DIAGNOSIS — R1319 Other dysphagia: Secondary | ICD-10-CM | POA: Diagnosis not present

## 2023-08-02 DIAGNOSIS — R1314 Dysphagia, pharyngoesophageal phase: Secondary | ICD-10-CM | POA: Diagnosis not present

## 2023-08-02 DIAGNOSIS — R1310 Dysphagia, unspecified: Secondary | ICD-10-CM | POA: Diagnosis not present

## 2023-08-03 ENCOUNTER — Encounter: Payer: Self-pay | Admitting: Internal Medicine

## 2023-08-03 ENCOUNTER — Ambulatory Visit: Payer: Medicaid Other | Admitting: Internal Medicine

## 2023-08-03 VITALS — BP 114/76 | HR 66 | Temp 98.2°F | Ht 68.0 in | Wt 128.6 lb

## 2023-08-03 DIAGNOSIS — R1314 Dysphagia, pharyngoesophageal phase: Secondary | ICD-10-CM | POA: Diagnosis not present

## 2023-08-03 DIAGNOSIS — Z7689 Persons encountering health services in other specified circumstances: Secondary | ICD-10-CM | POA: Diagnosis not present

## 2023-08-03 DIAGNOSIS — S27818A Other injury of esophagus (thoracic part), initial encounter: Secondary | ICD-10-CM | POA: Diagnosis not present

## 2023-08-03 DIAGNOSIS — R221 Localized swelling, mass and lump, neck: Secondary | ICD-10-CM

## 2023-08-03 DIAGNOSIS — G43011 Migraine without aura, intractable, with status migrainosus: Secondary | ICD-10-CM

## 2023-08-03 DIAGNOSIS — K2100 Gastro-esophageal reflux disease with esophagitis, without bleeding: Secondary | ICD-10-CM | POA: Diagnosis not present

## 2023-08-03 DIAGNOSIS — T50905D Adverse effect of unspecified drugs, medicaments and biological substances, subsequent encounter: Secondary | ICD-10-CM

## 2023-08-03 MED ORDER — SUCRALFATE 1 GM/10ML PO SUSP
1.0000 g | Freq: Three times a day (TID) | ORAL | 0 refills | Status: DC
Start: 2023-08-03 — End: 2023-10-10

## 2023-08-03 MED ORDER — DIPHENHYDRAMINE HCL 12.5 MG/5ML PO LIQD: 10.0000 mL | Freq: Two times a day (BID) | ORAL | 0 refills | Status: DC

## 2023-08-03 MED ORDER — NURTEC 75 MG PO TBDP: 1.0000 | ORAL_TABLET | Freq: Every day | ORAL | 11 refills | Status: DC

## 2023-08-03 NOTE — Patient Instructions (Addendum)
# Your Visit Summary and Care Plan  Dear Mr.Leggette,   We want to ensure you have a clear understanding of what we discussed during your recent visit and the plan we've developed to address your health concerns. This summary includes detailed information about your conditions, medications, and next steps.  ## Your Health Concerns  ### 1. Esophageal Discomfort and Difficulty Eating  You've been experiencing significant discomfort in your esophagus (the tube that connects your mouth to your stomach), which has made eating challenging. This has led you to rely more on nutrition drinks, and you're understandably concerned about maintaining a healthy weight.  **Diagnosis:** Esophageal Abrasion and Esophagitis  This means the lining of your esophagus is irritated and possibly damaged. It's likely causing pain, especially when you swallow, and may be making it difficult for you to eat normally.  ### 2. Persistent Headaches  You've been dealing with ongoing headaches, which have become more severe since your recent anesthesia. We believe these are primarily tension-type headaches, but you're also experiencing migraines.  ### 3. Other Concerns  We also discussed your upcoming neurosurgery appointment and the evaluation for a suspected traumatic brain injury.  ## Your Treatment Plan  ### For Your Esophageal Discomfort:  1. **Sucralfate (Carafate) Suspension**    - Purpose: This medication forms a protective coating in your esophagus to promote healing.    - How to take: 10 mL (2 teaspoons) by mouth 3 times daily, with meals.    - Special instructions: Try to avoid lying down for at least 30 minutes after taking this medication.  2. **Dexamethasone and Diphenhydramine Oral Solution**    - Purpose: This combination helps reduce inflammation (swelling) in your esophagus and may help with any associated allergic reactions.    - How to take: 10 mL (2 teaspoons) by mouth 2 times daily.    -  Special instructions: This medication may cause drowsiness. Take your evening dose a few hours before bedtime.  3. **Liquid Ibuprofen (over-the-counter)**    - Purpose: To help with inflammation and pain.    - How to take: Follow the instructions on the bottle.    - Important: If your symptoms worsen after taking ibuprofen, stop using it and contact us immediately.  4. **Possible Addition: Prednisolone**    - We may consider adding this if your throat swelling increases. We'll discuss this at your follow-up if needed.  ### For Your Headaches:  1. **Rimegepant (Nurtec)**    - Purpose: To treat and prevent migraine headaches.    - How to take: 1 tablet (75 mg) by mouth daily at 6 AM.    - Special instructions: This medication dissolves on or under your tongue. No need to take with water.  2. **Injectable Imitrex**    - We've prescribed this for severe migraine attacks. We'll provide separate instructions on how to use this.  3. **Aimovig**    - Continue with your current Aimovig regimen for migraine prevention.  4. **Liquid Ibuprofen (over-the-counter)**    - You can continue to use this for tension headaches as needed.  ## Nutrition and Weight Management  - Continue using nutrition drinks to maintain your weight while eating is difficult. - Keep a food diary: Write down everything you eat and drink, and note any discomfort. This will help [place] identify potential trigger foods. - Try eating smaller, more frequent meals if large meals are challenging. - Stick to soft, cool foods that are easier to swallow until your esophagus heals.  ##  Important Reminders  1. Bring your disability papers to your next appointment with [place]. 2. Attend your scheduled appointments with neurosurgery and the traumatic brain injury specialist. 3. We'll review your upcoming MRI results at your next visit.  ## When to Contact us  Please reach out immediately if you experience: - Difficulty breathing or  swallowing - Chest pain - Severe headache that doesn't respond to your prescribed medications - Any unexpected side effects from your medications  Remember, we're here to support you through this challenging time. Don't hesitate to contact us with any questions or concerns about your treatment plan.

## 2023-08-03 NOTE — Progress Notes (Signed)
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    Creatinine, Ser 03/05/2023 0.72    GFR 03/05/2023 122.22    Calcium 03/05/2023 9.9    TSH 03/05/2023 0.98    Magnesium 03/05/2023 1.9   Admission on  01/30/2023, Discharged on 01/30/2023  Component Date Value   Sodium 01/30/2023 140    Potassium 01/30/2023 4.2    Chloride 01/30/2023 101    CO2 01/30/2023 27    Glucose, Bld 01/30/2023 108 (H)    BUN 01/30/2023 <5 (L)    Creatinine, Ser 01/30/2023 0.88    Calcium 01/30/2023 9.6    GFR, Estimated 01/30/2023 >60    Anion gap 01/30/2023 12   Clinical Support on 01/16/2023  Component Date Value   TB Skin Test 01/18/2023 Negative    Induration 01/18/2023 0   Lab on 01/16/2023  Component Date Value   Anti Nuclear Antibody (A* 01/16/2023 NEGATIVE    ds DNA Ab 01/16/2023 1    Scleroderma (Scl-70) (EN* 01/16/2023 <1.0 NEG    ENA SM Ab Ser-aCnc 01/16/2023 <1.0 NEG    SM/RNP 01/16/2023 <1.0 NEG    SSA (Ro) (ENA) Antibody,* 01/16/2023 <1.0 NEG    SSB (La) (ENA) Antibody,*  01/16/2023 <1.0 NEG    CRP 01/16/2023 <1.0    Sed Rate 01/16/2023 4    VITD 01/16/2023 24.94 (L)    Vitamin B-12 01/16/2023 524    Folate 01/16/2023 17.1    HIV 1&2 Ab, 4th Generati* 01/16/2023 NON-REACTIVE    Path Review 01/16/2023     D-Dimer, Quant 01/16/2023 <0.19   Appointment on 12/18/2022  Component Date Value   Area-P 1/2 12/18/2022 3.24    S' Lateral 12/18/2022 3.00    Est EF 12/18/2022 60 - 65%   Appointment on 12/18/2022  Component Date Value   TSH 12/18/2022 1.300    Cholesterol, Total 12/18/2022 158    Triglycerides 12/18/2022 53    HDL 12/18/2022 48    VLDL Cholesterol Cal 12/18/2022 11    LDL Chol Calc (NIH) 12/18/2022 99    Chol/HDL Ratio 12/18/2022 3.3    Glucose 12/18/2022 88    BUN 12/18/2022 4 (L)    Creatinine, Ser 12/18/2022 0.95    eGFR 12/18/2022 110    BUN/Creatinine Ratio 12/18/2022 4 (L)    Sodium 12/18/2022 144    Potassium 12/18/2022 4.0    Chloride 12/18/2022 103    CO2 12/18/2022 21    Calcium 12/18/2022 9.8    Total Protein 12/18/2022 7.1    Albumin 12/18/2022 4.8    Globulin, Total 12/18/2022 2.3    Albumin/Globulin Ratio 12/18/2022 2.1    Bilirubin Total 12/18/2022 0.3    Alkaline Phosphatase 12/18/2022 55    AST 12/18/2022 14    ALT 12/18/2022 10    WBC 12/18/2022 3.0 (L)    RBC 12/18/2022 4.83    Hemoglobin 12/18/2022 14.1    Hematocrit 12/18/2022 40.5    MCV 12/18/2022 84    MCH 12/18/2022 29.2    MCHC 12/18/2022 34.8    RDW 12/18/2022 13.1    Platelets 12/18/2022 195    Neutrophils 12/18/2022 45    Lymphs 12/18/2022 43    Monocytes 12/18/2022 10    Eos 12/18/2022 1    Basos 12/18/2022 1    Neutrophils Absolute 12/18/2022 1.4    Lymphocytes Absolute 12/18/2022 1.3    Monocytes Absolute 12/18/2022 0.3    EOS (ABSOLUTE) 12/18/2022 0.0    Basophils Absolute 12/18/2022 0.0    Immature Granulocytes 12/18/2022 0    Immature Grans (Abs) 12/18/2022 0.0   Office Visit on 11/24/2022  Component Date Value   Sed Rate 11/24/2022  2    CRP 11/24/2022 6   Admission on 11/22/2022, Discharged on 11/22/2022  Component Date Value  Sodium 11/22/2022 137    Potassium 11/22/2022 4.1    Chloride 11/22/2022 103    CO2 11/22/2022 27    Glucose, Bld 11/22/2022 92    BUN 11/22/2022 5 (L)    Creatinine, Ser 11/22/2022 0.82    Calcium 11/22/2022 9.7    GFR, Estimated 11/22/2022 >60    Anion gap 11/22/2022 7    WBC 11/22/2022 4.1    RBC 11/22/2022 5.14    Hemoglobin 11/22/2022 14.4    HCT 11/22/2022 41.7    MCV 11/22/2022 81.1    MCH 11/22/2022 28.0    MCHC 11/22/2022 34.5    RDW 11/22/2022 12.5    Platelets 11/22/2022 216    nRBC 11/22/2022 0.0    Troponin I (High Sensiti* 11/22/2022 6    Sodium 11/22/2022 140    Potassium 11/22/2022 4.2    Chloride 11/22/2022 100    BUN 11/22/2022 <3 (L)    Creatinine, Ser 11/22/2022 0.80    Glucose, Bld 11/22/2022 89    Calcium, Ion 11/22/2022 1.26    TCO2 11/22/2022 26    Hemoglobin 11/22/2022 14.6    HCT 11/22/2022 43.0    Troponin I (High Sensiti* 11/22/2022 5   There may be more visits with results that are not included.   No image results found.   No results found.  CT Soft Tissue Neck W Contrast  Result Date: 04/12/2023 CLINICAL DATA:  Throat pain for 2 years EXAM: CT NECK WITH CONTRAST TECHNIQUE: Multidetector CT imaging of the neck was performed using the standard protocol following the bolus administration of intravenous contrast. RADIATION DOSE REDUCTION: This exam was performed according to the departmental dose-optimization program which includes automated exposure control, adjustment of the mA and/or kV according to patient size and/or use of iterative reconstruction technique. CONTRAST:  75mL OMNIPAQUE IOHEXOL 300 MG/ML  SOLN COMPARISON:  11/12/2022 FINDINGS: Pharynx and larynx: Normal. No mass or swelling. Salivary glands: No inflammation, mass, or stone. Thyroid: Normal. Lymph nodes: None enlarged or abnormal density. Vascular: Patent. Incidental note is made  that the left vertebral artery originates from the aorta, a normal variant. Limited intracranial: Negative. Visualized orbits: Negative. Mastoids and visualized paranasal sinuses: Clear. Skeleton: No acute fracture or suspicious osseous lesion. Upper chest: Imaged lungs are clear. Other: None. IMPRESSION: Normal CT of the neck. No findings to explain the patient's symptoms. Electronically Signed   By: Wiliam Ke M.D.   On: 04/12/2023 18:14   DG Chest Portable 1 View  Result Date: 04/12/2023 CLINICAL DATA:  Cough.  Sore throat. EXAM: PORTABLE CHEST 1 VIEW COMPARISON:  X-ray 03/29/2023 and older FINDINGS: The heart size and mediastinal contours are within normal limits. Both lungs are clear. Consolidation, pneumothorax or effusion. No edema. The visualized skeletal structures are unremarkable. IMPRESSION: No acute cardiopulmonary disease. Electronically Signed   By: Karen Kays M.D.   On: 04/12/2023 17:15       Additional Info: This encounter employed real-time, collaborative documentation. The patient actively reviewed and updated their medical record on a shared screen, ensuring transparency and facilitating joint problem-solving for the problem list, overview, and plan. This approach promotes accurate, informed care. The treatment plan was discussed and reviewed in detail, including medication safety, potential side effects, and all patient questions. We confirmed understanding and comfort with the plan. Follow-up instructions were established, including contacting the office for any concerns, returning if symptoms worsen, persist, or new symptoms develop, and precautions for potential emergency department visits.

## 2023-08-03 NOTE — Assessment & Plan Note (Signed)
Due to esophageal discomfort, he is having difficulty eating and is relying on nutrition drinks. We encourage the continued use of nutrition drinks to maintain weight and suggest considering a food diary to identify potential food triggers for esophageal irritation.

## 2023-08-06 DIAGNOSIS — M47812 Spondylosis without myelopathy or radiculopathy, cervical region: Secondary | ICD-10-CM | POA: Diagnosis not present

## 2023-08-06 DIAGNOSIS — G4486 Cervicogenic headache: Secondary | ICD-10-CM | POA: Diagnosis not present

## 2023-08-09 DIAGNOSIS — Z7689 Persons encountering health services in other specified circumstances: Secondary | ICD-10-CM | POA: Diagnosis not present

## 2023-08-09 DIAGNOSIS — G4486 Cervicogenic headache: Secondary | ICD-10-CM | POA: Diagnosis not present

## 2023-08-09 DIAGNOSIS — M542 Cervicalgia: Secondary | ICD-10-CM | POA: Diagnosis not present

## 2023-08-09 DIAGNOSIS — F0781 Postconcussional syndrome: Secondary | ICD-10-CM | POA: Diagnosis not present

## 2023-08-10 ENCOUNTER — Ambulatory Visit: Payer: Medicaid Other | Admitting: Internal Medicine

## 2023-08-10 ENCOUNTER — Encounter: Payer: Self-pay | Admitting: Internal Medicine

## 2023-08-13 DIAGNOSIS — Z7689 Persons encountering health services in other specified circumstances: Secondary | ICD-10-CM | POA: Diagnosis not present

## 2023-08-13 DIAGNOSIS — M8938 Hypertrophy of bone, other site: Secondary | ICD-10-CM | POA: Diagnosis not present

## 2023-08-13 DIAGNOSIS — R1310 Dysphagia, unspecified: Secondary | ICD-10-CM | POA: Diagnosis not present

## 2023-08-14 ENCOUNTER — Ambulatory Visit: Payer: Medicaid Other | Admitting: Internal Medicine

## 2023-08-14 ENCOUNTER — Other Ambulatory Visit (HOSPITAL_COMMUNITY): Payer: Self-pay

## 2023-08-14 ENCOUNTER — Telehealth: Payer: Self-pay

## 2023-08-14 NOTE — Telephone Encounter (Signed)
Pharmacy Patient Advocate Encounter   Received notification from Patient Pharmacy that prior authorization for Nurtec 75MG  dispersible tablets is required/requested.   Insurance verification completed.   The patient is insured through [facility] [place] .   Per test claim: PA required; PA submitted to [facility] [place] Medicaid via CoverMyMeds Key/confirmation #/EOC [id number] Status is pending

## 2023-08-15 ENCOUNTER — Encounter: Payer: Self-pay | Admitting: Internal Medicine

## 2023-08-15 ENCOUNTER — Telehealth: Payer: Medicaid Other | Admitting: Internal Medicine

## 2023-08-15 VITALS — Ht 68.0 in

## 2023-08-15 DIAGNOSIS — F0781 Postconcussional syndrome: Secondary | ICD-10-CM | POA: Diagnosis not present

## 2023-08-15 DIAGNOSIS — Z59819 Housing instability, housed unspecified: Secondary | ICD-10-CM

## 2023-08-15 DIAGNOSIS — Z562 Threat of job loss: Secondary | ICD-10-CM | POA: Diagnosis not present

## 2023-08-15 DIAGNOSIS — K2100 Gastro-esophageal reflux disease with esophagitis, without bleeding: Secondary | ICD-10-CM | POA: Diagnosis not present
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
## 2023-08-15 NOTE — Assessment & Plan Note (Signed)
He is scheduled for an earlier appointment with physiatry to establish a treatment plan. He will attend the physiatry appointment as scheduled.
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
## 2023-08-15 NOTE — Assessment & Plan Note (Signed)
He faces an immediate threat of homelessness and understands the urgency of securing stable housing before focusing on employment. We provided resources for local shelters and housing assistance programs. He will contact local shelters and housing assistance programs immediately and reach out to a Child psychotherapist for additional resources and support.

## 2023-08-15 NOTE — Patient Instructions (Signed)
Visit Information  Nicholas Johnson was given information about Medicaid Managed Care team care coordination services as a part of their [facility] Medicaid benefit. [name] verbally consented to engagement with the [facility] Managed Care team.   If you are experiencing a medical emergency, please call 911 or report to your local emergency department or urgent care.   If you have a non-emergency medical problem during routine business hours, please contact your provider's office and ask to speak with a nurse.   For questions related to your [facility] health plan, please call: [phone] or go here:[email or web address]  If you would like to schedule transportation through your [facility] plan, please call the following number at least 2 days in advance of your appointment: [phone].   You can also use the MTM portal or MTM mobile app to manage your rides. Reimbursement for transportation is available through [facility]! For the portal, please go to [email or web address]  Call the [facility] Crisis Line at [phone], at any time, 24 hours a day, 7 days a week. If you are in danger or need immediate medical attention call 911.  If you would like help to quit smoking, call 1-800-QUIT-NOW ((336)596-3339) OR Espaol: 1-855-Djelo-Ya (5-643-329-5188) o para ms informacin haga clic aqu or Text READY to 416-606 to register via text  Nicholas Johnson - following are the goals we discussed in your visit today:   Goals Addressed   None       Social Worker will follow up in 7 days.   Gus Puma, Kenard Gower, MHA [facility]  Managed Medicaid Social Worker [phone]   Following is a copy of your plan of care:  There are no care plans that you recently modified to display for this patient.

## 2023-08-15 NOTE — Patient Instructions (Signed)
VISIT SUMMARY:  During today's visit, we discussed your current housing and employment challenges, as well as your ongoing health issues. We reviewed the resources available to you for housing and employment support, and we also discussed your recent medical tests and upcoming appointments.  YOUR PLAN:  -HOUSING INSTABILITY: You are facing an immediate threat of homelessness and need to secure stable housing urgently. We provided you with resources for local shelters and housing assistance programs. Please contact these shelters and programs immediately and reach out to a Child psychotherapist for additional support.  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 We discussed that securing stable housing should be your first priority. Once you have stable housing, you can intensify your job search efforts. 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  -GASTROESOPHAGEAL REFLUX DISEASE (GERD): GERD is a condition where stomach acid frequently flows back into the tube connecting your mouth and stomach, causing irritation. Your recent endoscopy showed a small amount of acid reflux in your lower esophagus. Continue with your current treatment plan and follow up with your GI specialist as scheduled.  -NECK AND HEAD PAIN: You are experiencing pain in your neck and head and are scheduled to see a physiatrist to establish a treatment plan. Please attend your physiatry appointment as scheduled.  INSTRUCTIONS:  Please contact local shelters and housing assistance programs immediately and reach out to a Child psychotherapist for additional resources and support. 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 Follow up with your GI specialist as scheduled and attend your physiatry appointment next week.

## 2023-08-16 NOTE — Telephone Encounter (Signed)
Pharmacy Patient Advocate Encounter  Received notification from [facility] Medicaid that Prior Authorization for Nurtec 75MG  dispersible tablets has been APPROVED from 08-14-2023 to 08-13-2024   PA #/Case ID/Reference #: [id number]

## 2023-08-17 ENCOUNTER — Telehealth: Payer: Medicaid Other | Admitting: Internal Medicine

## 2023-08-17 ENCOUNTER — Telehealth (INDEPENDENT_AMBULATORY_CARE_PROVIDER_SITE_OTHER): Payer: Medicaid Other | Admitting: Internal Medicine

## 2023-08-17 ENCOUNTER — Encounter: Payer: Self-pay | Admitting: Internal Medicine

## 2023-08-17 ENCOUNTER — Ambulatory Visit: Payer: Medicaid Other | Admitting: Internal Medicine

## 2023-08-17 DIAGNOSIS — K219 Gastro-esophageal reflux disease without esophagitis: Secondary | ICD-10-CM | POA: Diagnosis not present

## 2023-08-17 DIAGNOSIS — Z59819 Housing instability, housed unspecified: Secondary | ICD-10-CM

## 2023-08-17 DIAGNOSIS — K224 Dyskinesia of esophagus: Secondary | ICD-10-CM | POA: Diagnosis not present

## 2023-08-17 DIAGNOSIS — R1319 Other dysphagia: Secondary | ICD-10-CM | POA: Diagnosis not present
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
## 2023-08-18 NOTE — Assessment & Plan Note (Signed)
  Financial Insecurity   He is currently unemployed and homeless but is actively exploring employment and temporary housing options. He maintains hygiene through a [employer] and using laundry facilities at a previous apartment complex. He will continue to apply for jobs and explore temporary housing options. He will reach out to social services for potential housing assistance and consider volunteering at [employer] for job opportunities and networking. 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  Health Maintenance   He has ongoing medical issues requiring regular doctor's appointments and is concerned about potential employers accommodating these appointments. He will schedule a close follow-up appointment and communicate health needs to potential employers as necessary.

## 2023-08-21 ENCOUNTER — Other Ambulatory Visit: Payer: Self-pay

## 2023-08-21 DIAGNOSIS — F0781 Postconcussional syndrome: Secondary | ICD-10-CM | POA: Diagnosis not present

## 2023-08-21 DIAGNOSIS — M542 Cervicalgia: Secondary | ICD-10-CM | POA: Diagnosis not present

## 2023-08-21 DIAGNOSIS — S069XAD Unspecified intracranial injury with loss of consciousness status unknown, subsequent encounter: Secondary | ICD-10-CM | POA: Diagnosis not present

## 2023-08-21 DIAGNOSIS — Z7689 Persons encountering health services in other specified circumstances: Secondary | ICD-10-CM | POA: Diagnosis not present

## 2023-08-21 NOTE — Patient Instructions (Signed)
Visit Information  Mr. Gerrits was given information about Medicaid Managed Care team care coordination services as a part of their [facility] Medicaid benefit. [name] verbally consented to engagement with the [facility] Managed Care team.   If you are experiencing a medical emergency, please call 911 or report to your local emergency department or urgent care.   If you have a non-emergency medical problem during routine business hours, please contact your provider's office and ask to speak with a nurse.   For questions related to your [facility] health plan, please call: [phone] or go here:[email or web address]  If you would like to schedule transportation through your [facility] plan, please call the following number at least 2 days in advance of your appointment: [phone].   You can also use the MTM portal or MTM mobile app to manage your rides. Reimbursement for transportation is available through [facility]! For the portal, please go to [email or web address]  Call the [facility] Crisis Line at [phone], at any time, 24 hours a day, 7 days a week. If you are in danger or need immediate medical attention call 911.  If you would like help to quit smoking, call 1-800-QUIT-NOW (412-336-9142) OR Espaol: 1-855-Djelo-Ya (2-725-366-4403) o para ms informacin haga clic aqu or Text READY to 474-259 to register via text  Mr. Robbs - following are the goals we discussed in your visit today:   Goals Addressed   None      Social Worker will follow up on 09/12/23.   Gus Puma, Kenard Gower, MHA [facility]  Managed Medicaid Social Worker [phone]   Following is a copy of your plan of care:  There are no care plans that you recently modified to display for this patient.
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
## 2023-08-22 ENCOUNTER — Other Ambulatory Visit: Payer: Self-pay

## 2023-08-22 ENCOUNTER — Telehealth: Payer: Self-pay | Admitting: Internal Medicine

## 2023-08-22 DIAGNOSIS — Z59819 Housing instability, housed unspecified: Secondary | ICD-10-CM

## 2023-08-22 NOTE — Telephone Encounter (Signed)
Patient requests to be called re: Referral to head ache Specialist

## 2023-08-23 ENCOUNTER — Telehealth: Payer: Medicaid Other | Admitting: Internal Medicine

## 2023-08-23 ENCOUNTER — Encounter: Payer: Self-pay | Admitting: Internal Medicine

## 2023-08-23 DIAGNOSIS — H5713 Ocular pain, bilateral: Secondary | ICD-10-CM

## 2023-08-23 DIAGNOSIS — M1610 Unilateral primary osteoarthritis, unspecified hip: Secondary | ICD-10-CM

## 2023-08-23 DIAGNOSIS — Z59819 Housing instability, housed unspecified: Secondary | ICD-10-CM

## 2023-08-23 DIAGNOSIS — G43709 Chronic migraine without aura, not intractable, without status migrainosus: Secondary | ICD-10-CM | POA: Diagnosis not present

## 2023-08-23 DIAGNOSIS — F0781 Postconcussional syndrome: Secondary | ICD-10-CM | POA: Diagnosis not present

## 2023-08-23 DIAGNOSIS — K224 Dyskinesia of esophagus: Secondary | ICD-10-CM | POA: Diagnosis not present

## 2023-08-23 MED ORDER — ZAVZPRET 10 MG/ACT NA SOLN: 1.0000 | Freq: Every day | NASAL | 2 refills | Status: DC

## 2023-08-23 MED ORDER — CELECOXIB 120 MG/4.8ML PO SOLN: 120.0000 mg | Freq: Three times a day (TID) | ORAL | 2 refills | Status: DC

## 2023-08-23 NOTE — Assessment & Plan Note (Signed)
Esophageal dysmotility, confirmed by manometry, presents with difficulty swallowing and reduced food intake. Despite current management by a GI specialist lacking expertise in dysmotility, we will consider a referral to Dr. Ebony Cargo at [facility], a known dysmotility specialist. The current management plan directed by the GI specialist will continue.

## 2023-08-23 NOTE — Assessment & Plan Note (Signed)
The severe headache, likely cervicogenic, has not responded to current management, and previous nerve blocks caused irritation. We will start Lyrica 50mg , allowing up to 4 doses in 2 hours if the headache persists. Should other treatments fail, Zavzpret nasal spray will be considered. A follow-up appointment tomorrow will review the effectiveness of these treatments.  Their current appointments with neurology and the post-concussive clinic are booked out until December and January, respectively. Efforts will be made to secure earlier appointments. A follow-up appointment tomorrow is scheduled to assess the effectiveness of headache management.

## 2023-08-23 NOTE — Assessment & Plan Note (Signed)
He reports that his housing situation is less insecure than previously that his aunt and uncle seem less determined to have a evict him than previously but just is recently as last week he was advised to pack up and be out within 24 hours from the small building he is living in and every one of the housing resources failed to provide any sort of temporary or permanent housing solution but he reports that he has tried everything and he has worked with social work
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
## 2023-08-23 NOTE — Assessment & Plan Note (Signed)
SEVERE HEADACHE: Your severe headaches, likely originating from the neck, have not responded to current treatments. We are starting you on Lyrica 50mg , which you can take up to 4 doses in 2 hours if needed. If this does not help, we may consider Zavzpret nasal spray. We will review how these treatments are working at your follow-up appointment tomorrow.
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
## 2023-08-24 ENCOUNTER — Ambulatory Visit: Payer: Medicaid Other | Admitting: Internal Medicine

## 2023-08-24 ENCOUNTER — Telehealth (INDEPENDENT_AMBULATORY_CARE_PROVIDER_SITE_OTHER): Payer: Medicaid Other | Admitting: Internal Medicine

## 2023-08-24 DIAGNOSIS — Z87820 Personal history of traumatic brain injury: Secondary | ICD-10-CM | POA: Diagnosis not present

## 2023-08-24 DIAGNOSIS — M24159 Other articular cartilage disorders, unspecified hip: Secondary | ICD-10-CM

## 2023-08-24 DIAGNOSIS — G8929 Other chronic pain: Secondary | ICD-10-CM | POA: Diagnosis not present

## 2023-08-24 DIAGNOSIS — Z419 Encounter for procedure for purposes other than remedying health state, unspecified: Secondary | ICD-10-CM | POA: Diagnosis not present

## 2023-08-24 DIAGNOSIS — F0781 Postconcussional syndrome: Secondary | ICD-10-CM | POA: Diagnosis not present

## 2023-08-24 DIAGNOSIS — Z7689 Persons encountering health services in other specified circumstances: Secondary | ICD-10-CM | POA: Diagnosis not present

## 2023-08-24 DIAGNOSIS — M542 Cervicalgia: Secondary | ICD-10-CM | POA: Diagnosis not present

## 2023-08-24 DIAGNOSIS — G44309 Post-traumatic headache, unspecified, not intractable: Secondary | ICD-10-CM

## 2023-08-24 DIAGNOSIS — Z20822 Contact with and (suspected) exposure to covid-19: Secondary | ICD-10-CM | POA: Diagnosis not present

## 2023-08-24 DIAGNOSIS — K224 Dyskinesia of esophagus: Secondary | ICD-10-CM

## 2023-08-24 DIAGNOSIS — K219 Gastro-esophageal reflux disease without esophagitis: Secondary | ICD-10-CM | POA: Diagnosis not present

## 2023-08-24 DIAGNOSIS — R519 Headache, unspecified: Secondary | ICD-10-CM | POA: Diagnosis not present
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
## 2023-08-25 ENCOUNTER — Encounter: Payer: Self-pay | Admitting: Internal Medicine

## 2023-08-25 NOTE — Assessment & Plan Note (Signed)
Persistent headache, dizziness, and eye discomfort characterize his post-concussion syndrome, with no relief from Lyrica, which may have exacerbated the dizziness. He awaits follow-up with a post-concussive clinic and neurology. We will continue his current medications, try Celebrex liquid for the headache, and introduce Zavzpret nasal spray. He will follow up with the post-concussive clinic and neurology.  He is enroute to emergency room for evaluation during the appointment.

## 2023-08-25 NOTE — Patient Instructions (Addendum)
VISIT SUMMARY:  During today's visit, we discussed your ongoing symptoms, including head and eye discomfort, dizziness, difficulty eating, and hip pain. We reviewed your current medications and made some adjustments to help manage your symptoms more effectively.  YOUR PLAN:  -POST-CONCUSSION SYNDROME: Post-concussion syndrome is a complex disorder with symptoms like headaches and dizziness that persist after a head injury. We will continue your current medications, add Celebrex liquid for headaches, and introduce Zavzpret nasal spray. Please follow up with the post-concussive clinic and neurology as scheduled.  -HIP ARTHRITIS: Hip arthritis is the inflammation of the hip joint causing pain and stiffness. We will continue your current medications and consider a follow-up with an orthopedic surgeon if the pain persists.  -GASTROINTESTINAL DYSMOTILITY: Gastrointestinal dysmotility refers to abnormal movement of the digestive system muscles, causing difficulty in eating. We will continue your current medications and ensure you follow up with a gastroenterologist and dysmotility specialist.  INSTRUCTIONS:  Please follow up in 1 week to reassess your symptoms and the effectiveness of the treatment. Make sure to attend your appointments with the post-concussive clinic, neurology, and the dysmotility specialist as scheduled.   It was a pleasure seeing you today! Your health and satisfaction are our top priorities.  Glenetta Hew, MD  Your Providers PCP: [name], MD,  [phone]) Referring Provider: [name], MD,  [phone]) Care Team Provider: Curt Bears, MD,  [phone]) Care Team Provider: Barnie Alderman, MD,  [phone]) Care Team Provider: Concepcion Elk, MD,  [phone]) Care Team Provider: Creed Copper, MD,  [phone]) Care Team Provider: London Sheer, MD,  [phone]) Care Team Provider: Nyoka Cowden, MD,  [phone]) Care  Team Provider: Case, [place], MD,  [phone]) Care Team Provider: Sonny Dandy, MD,  [phone]) Care Team Provider: Shaune Leeks Care Team Provider: Margo Common, MD,  [phone]) Care Team Provider: Serita Grit, FNP,  [phone]) Care Team Provider: Mar Daring, MD,  [phone])     NEXT STEPS: [x]  Early Intervention: Schedule sooner appointment, call our on-call services, or go to emergency room if there is any significant Increase in pain or discomfort New or worsening symptoms Sudden or severe changes in your health [x]  Flexible Follow-Up: We recommend a No follow-ups on file. for optimal routine care. This allows for progress monitoring and treatment adjustments. [x]  Preventive Care: Schedule your annual preventive care visit! It's typically covered by insurance and helps identify potential health issues early. [x]  Lab & X-ray Appointments: Incomplete tests scheduled today, or call to schedule. X-rays: [facility] (M-F, 8:30am-noon or 1pm-5pm). [x]  Medical Information Release: Sign a release form at front desk to obtain relevant medical information we don't have.  MAKING THE MOST OF OUR FOCUSED 20 MINUTE APPOINTMENTS: [x]   Clearly state your top concerns at the beginning of the visit to focus our discussion [x]   If you anticipate you will need more time, please inform the front desk during scheduling - we can book multiple appointments in the same week. [x]   If you have transportation problems- use our convenient video appointments or ask about transportation support. [x]   We can get down to business faster if you use MyChart to update information before the visit and submit non-urgent questions before your visit. Thank you for taking the time to provide details through MyChart.  Let our nurse know and she can import this information into your encounter documents.  Arrival and Wait Times: [x]   Arriving on  time ensures that everyone  receives prompt attention. [x]   Early morning (8a) and afternoon (1p) appointments tend to have shortest wait times. [x]   Unfortunately, we cannot delay appointments for late arrivals or hold slots during phone calls.  Getting Answers and Following Up [x]   Simple Questions & Concerns: For quick questions or basic follow-up after your visit, reach [place] at [phone] or MyChart messaging. [x]   Complex Concerns: If your concern is more complex, scheduling an appointment might be best. Discuss this with the staff to find the most suitable option. [x]   Lab & Imaging Results: We'll contact you directly if results are abnormal or you don't use MyChart. Most normal results will be on MyChart within 2-3 business days, with a review message from Dr. Jon Billings. Haven't heard back in 2 weeks? Need results sooner? Contact us at [phone]. [x]   Referrals: Our referral coordinator will manage specialist referrals. The specialist's office should contact you within 2 weeks to schedule an appointment. Call us if you haven't heard from them after 2 weeks.  Staying Connected [x]   MyChart: Activate your MyChart for the fastest way to access results and message [place]. See the last page of this paperwork for instructions on how to activate.  Bring to Your Next Appointment [x]   Medications: Please bring all your medication bottles to your next appointment to ensure we have an accurate record of your prescriptions. [x]   Health Diaries: If you're monitoring any health conditions at home, keeping a diary of your readings can be very helpful for discussions at your next appointment.  Billing [x]   X-ray & Lab Orders: These are billed by separate companies. Contact the invoicing company directly for questions or concerns. [x]   Visit Charges: Discuss any billing inquiries with our administrative services team.  Your Satisfaction Matters [x]   Share Your Experience: We strive for your  satisfaction! If you have any complaints, or preferably compliments, please let Dr. Jon Billings know directly or contact our Practice Administrators, Edwena Felty or [employer], by asking at the front desk.   Reviewing Your Records [x]   Review this early draft of your clinical encounter notes below and the final encounter summary tomorrow on MyChart after its been completed.  All orders placed so far are visible here: There are no diagnoses linked to this encounter.

## 2023-08-25 NOTE — Assessment & Plan Note (Signed)
He reports difficulty eating with no relief from current medications, awaiting an appointment with a dysmotility specialist. We will continue his current medications and ensure he follows up with a gastroenterologist and dysmotility specialist.

## 2023-08-25 NOTE — Assessment & Plan Note (Signed)
He experiences moderate pain from hip arthritis, impacting mobility, previously managed with Celebrex. We will continue his current medications and consider a follow-up with an orthopedic surgeon if the pain persists.

## 2023-08-27 DIAGNOSIS — M542 Cervicalgia: Secondary | ICD-10-CM | POA: Diagnosis not present

## 2023-08-27 DIAGNOSIS — R278 Other lack of coordination: Secondary | ICD-10-CM | POA: Diagnosis not present

## 2023-08-27 DIAGNOSIS — S069XAD Unspecified intracranial injury with loss of consciousness status unknown, subsequent encounter: Secondary | ICD-10-CM | POA: Diagnosis not present

## 2023-08-27 DIAGNOSIS — Z789 Other specified health status: Secondary | ICD-10-CM | POA: Diagnosis not present

## 2023-08-27 DIAGNOSIS — G44321 Chronic post-traumatic headache, intractable: Secondary | ICD-10-CM | POA: Diagnosis not present

## 2023-08-27 DIAGNOSIS — G44329 Chronic post-traumatic headache, not intractable: Secondary | ICD-10-CM | POA: Diagnosis not present

## 2023-08-27 DIAGNOSIS — Z7689 Persons encountering health services in other specified circumstances: Secondary | ICD-10-CM | POA: Diagnosis not present

## 2023-08-28 DIAGNOSIS — R09A2 Foreign body sensation, throat: Secondary | ICD-10-CM | POA: Diagnosis not present

## 2023-08-28 DIAGNOSIS — R1319 Other dysphagia: Secondary | ICD-10-CM | POA: Diagnosis not present

## 2023-08-28 DIAGNOSIS — R110 Nausea: Secondary | ICD-10-CM | POA: Diagnosis not present

## 2023-08-28 DIAGNOSIS — K219 Gastro-esophageal reflux disease without esophagitis: Secondary | ICD-10-CM | POA: Diagnosis not present

## 2023-08-29 ENCOUNTER — Encounter: Payer: Self-pay | Admitting: Internal Medicine

## 2023-08-29 ENCOUNTER — Telehealth (INDEPENDENT_AMBULATORY_CARE_PROVIDER_SITE_OTHER): Payer: Medicaid Other | Admitting: Internal Medicine

## 2023-08-29 DIAGNOSIS — M1610 Unilateral primary osteoarthritis, unspecified hip: Secondary | ICD-10-CM | POA: Diagnosis not present

## 2023-08-29 DIAGNOSIS — H5713 Ocular pain, bilateral: Secondary | ICD-10-CM

## 2023-08-29 DIAGNOSIS — K224 Dyskinesia of esophagus: Secondary | ICD-10-CM | POA: Diagnosis not present

## 2023-08-29 DIAGNOSIS — G43709 Chronic migraine without aura, not intractable, without status migrainosus: Secondary | ICD-10-CM

## 2023-08-29 DIAGNOSIS — F0781 Postconcussional syndrome: Secondary | ICD-10-CM

## 2023-08-29 MED ORDER — CELECOXIB 120 MG/4.8ML PO SOLN: 120.0000 mg | Freq: Three times a day (TID) | ORAL | 2 refills | Status: DC

## 2023-08-29 MED ORDER — DOXEPIN HCL 50 MG PO CAPS: 50.0000 mg | ORAL_CAPSULE | Freq: Every day | ORAL | 0 refills | Status: DC

## 2023-08-29 NOTE — Patient Instructions (Signed)
VISIT SUMMARY:  During today's visit, we discussed your ongoing symptoms of chronic headaches, difficulty swallowing, and associated issues. We reviewed your recent ER visit and the temporary relief you experienced from the medications administered there. We also discussed your current challenges with nutrition and hydration, as well as your ongoing neck pain and sensation issues.  YOUR PLAN:  -CHRONIC HEADACHE: Chronic headaches are persistent headaches that occur frequently over a long period. We will start you on a high-dose tricyclic antidepressant, which may help relieve your headache and reduce stress.  -ESOPHAGEAL DYSMOTILITY: Esophageal dysmotility refers to abnormal movement of the esophagus, making it difficult to swallow. We will proceed with the stomach pH test and consider esophageal stretching as recommended by your specialist.  -MALNUTRITION AND DEHYDRATION: Malnutrition and dehydration occur when the body does not get enough nutrients and fluids. We encourage you to continue consuming nutritional drinks like Ensure and increase your fluid intake to prevent dehydration.  -NECK PAIN AND SENSATION: Neck pain and sensation issues can be due to various causes, including nerve problems. We will consider neuromuscular testing and potential treatments like nerve ablation or trigger point injections.  -MEDICATION MANAGEMENT: We will call in a prescription for a tricyclic antidepressant and confirm that you have picked up the liquid form of Celebrex from [facility] on [address].  INSTRUCTIONS:  Please follow up with the neurologist and physiatrist regarding your ongoing symptoms and potential treatment options. We will review the results of your upcoming imaging studies and may schedule an office visit for further evaluation and management.
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
## 2023-08-31 ENCOUNTER — Ambulatory Visit (INDEPENDENT_AMBULATORY_CARE_PROVIDER_SITE_OTHER): Payer: Medicaid Other | Admitting: Internal Medicine

## 2023-08-31 ENCOUNTER — Encounter: Payer: Self-pay | Admitting: Internal Medicine

## 2023-08-31 VITALS — BP 114/72 | HR 68 | Temp 97.9°F | Ht 68.0 in | Wt 125.0 lb

## 2023-08-31 DIAGNOSIS — M5431 Sciatica, right side: Secondary | ICD-10-CM

## 2023-08-31 DIAGNOSIS — H00019 Hordeolum externum unspecified eye, unspecified eyelid: Secondary | ICD-10-CM | POA: Diagnosis not present

## 2023-08-31 DIAGNOSIS — H01005 Unspecified blepharitis left lower eyelid: Secondary | ICD-10-CM | POA: Diagnosis not present

## 2023-08-31 DIAGNOSIS — G44321 Chronic post-traumatic headache, intractable: Secondary | ICD-10-CM | POA: Diagnosis not present

## 2023-08-31 DIAGNOSIS — S069XAD Unspecified intracranial injury with loss of consciousness status unknown, subsequent encounter: Secondary | ICD-10-CM | POA: Diagnosis not present

## 2023-08-31 DIAGNOSIS — Z7689 Persons encountering health services in other specified circumstances: Secondary | ICD-10-CM | POA: Diagnosis not present

## 2023-08-31 DIAGNOSIS — G44329 Chronic post-traumatic headache, not intractable: Secondary | ICD-10-CM

## 2023-08-31 DIAGNOSIS — S14109D Unspecified injury at unspecified level of cervical spinal cord, subsequent encounter: Secondary | ICD-10-CM

## 2023-08-31 DIAGNOSIS — Z59819 Housing instability, housed unspecified: Secondary | ICD-10-CM

## 2023-08-31 DIAGNOSIS — F0781 Postconcussional syndrome: Secondary | ICD-10-CM

## 2023-08-31 DIAGNOSIS — R278 Other lack of coordination: Secondary | ICD-10-CM | POA: Diagnosis not present

## 2023-08-31 DIAGNOSIS — Z789 Other specified health status: Secondary | ICD-10-CM | POA: Diagnosis not present

## 2023-08-31 DIAGNOSIS — M542 Cervicalgia: Secondary | ICD-10-CM | POA: Diagnosis not present

## 2023-08-31 MED ORDER — ERYTHROMYCIN 5 MG/GM OP OINT: 1.0000 | TOPICAL_OINTMENT | Freq: Every day | OPHTHALMIC | 0 refills | Status: DC

## 2023-08-31 MED ORDER — KETOROLAC TROMETHAMINE 0.5 % OP SOLN: 1.0000 [drp] | Freq: Four times a day (QID) | OPHTHALMIC | 0 refills | Status: DC

## 2023-08-31 MED ORDER — CELECOXIB 100 MG PO CAPS: 100.0000 mg | ORAL_CAPSULE | Freq: Two times a day (BID) | ORAL | 3 refills | Status: DC

## 2023-08-31 NOTE — Assessment & Plan Note (Signed)
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 Previous treatments had limited success, and Celebrex  was ineffective due to improper use, while Lyrica  showed some benefit. We will prescribe Celebrex  100 mg capsules, continue Lyrica  100 mg, and consider trigger point injections at C6-C7, deemed safe with low risk and potentially large benefit.
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
## 2023-08-31 NOTE — Telephone Encounter (Signed)
Patient has been seen for video visits and office visits since this message was sent. Dr. Jon Billings is aware.

## 2023-08-31 NOTE — Progress Notes (Signed)
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 detail, including medication safety, potential side effects, and all patient questions. We confirmed understanding and comfort with the plan. Follow-up instructions were established, including contacting the office for any concerns, returning if symptoms worsen, persist, or new symptoms develop, and precautions for potential emergency department visits.

## 2023-08-31 NOTE — Assessment & Plan Note (Signed)
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 Previous treatments had limited success, and Celebrex was ineffective due to improper use, while Lyrica showed some benefit. We will prescribe Celebrex 100 mg capsules, continue Lyrica 100 mg, and consider trigger point injections at C6-C7, deemed safe with low risk and potentially large benefit.

## 2023-09-03 ENCOUNTER — Ambulatory Visit: Payer: Medicaid Other | Admitting: Internal Medicine

## 2023-09-04 DIAGNOSIS — S069XAD Unspecified intracranial injury with loss of consciousness status unknown, subsequent encounter: Secondary | ICD-10-CM | POA: Diagnosis not present

## 2023-09-04 DIAGNOSIS — Z79899 Other long term (current) drug therapy: Secondary | ICD-10-CM | POA: Diagnosis not present

## 2023-09-04 DIAGNOSIS — Z7689 Persons encountering health services in other specified circumstances: Secondary | ICD-10-CM | POA: Diagnosis not present

## 2023-09-04 DIAGNOSIS — F0781 Postconcussional syndrome: Secondary | ICD-10-CM | POA: Diagnosis not present

## 2023-09-04 DIAGNOSIS — M542 Cervicalgia: Secondary | ICD-10-CM | POA: Diagnosis not present

## 2023-09-05 DIAGNOSIS — G4486 Cervicogenic headache: Secondary | ICD-10-CM | POA: Diagnosis not present

## 2023-09-05 DIAGNOSIS — Z79899 Other long term (current) drug therapy: Secondary | ICD-10-CM | POA: Diagnosis not present

## 2023-09-05 DIAGNOSIS — H53149 Visual discomfort, unspecified: Secondary | ICD-10-CM | POA: Diagnosis not present

## 2023-09-05 DIAGNOSIS — R110 Nausea: Secondary | ICD-10-CM | POA: Diagnosis not present

## 2023-09-05 DIAGNOSIS — R42 Dizziness and giddiness: Secondary | ICD-10-CM | POA: Diagnosis not present

## 2023-09-05 DIAGNOSIS — G44329 Chronic post-traumatic headache, not intractable: Secondary | ICD-10-CM | POA: Diagnosis not present

## 2023-09-05 DIAGNOSIS — F0781 Postconcussional syndrome: Secondary | ICD-10-CM | POA: Diagnosis not present

## 2023-09-05 DIAGNOSIS — Z59811 Housing instability, housed, with risk of homelessness: Secondary | ICD-10-CM | POA: Diagnosis not present

## 2023-09-05 DIAGNOSIS — M542 Cervicalgia: Secondary | ICD-10-CM | POA: Diagnosis not present

## 2023-09-05 DIAGNOSIS — Z7689 Persons encountering health services in other specified circumstances: Secondary | ICD-10-CM | POA: Diagnosis not present

## 2023-09-06 DIAGNOSIS — M7912 Myalgia of auxiliary muscles, head and neck: Secondary | ICD-10-CM | POA: Diagnosis not present

## 2023-09-06 DIAGNOSIS — G8929 Other chronic pain: Secondary | ICD-10-CM | POA: Diagnosis not present

## 2023-09-06 DIAGNOSIS — R519 Headache, unspecified: Secondary | ICD-10-CM | POA: Diagnosis not present

## 2023-09-06 DIAGNOSIS — Z79899 Other long term (current) drug therapy: Secondary | ICD-10-CM | POA: Diagnosis not present

## 2023-09-06 DIAGNOSIS — Z87820 Personal history of traumatic brain injury: Secondary | ICD-10-CM | POA: Diagnosis not present

## 2023-09-06 DIAGNOSIS — S069XAA Unspecified intracranial injury with loss of consciousness status unknown, initial encounter: Secondary | ICD-10-CM | POA: Diagnosis not present

## 2023-09-06 DIAGNOSIS — F0781 Postconcussional syndrome: Secondary | ICD-10-CM | POA: Diagnosis not present

## 2023-09-06 DIAGNOSIS — Z5181 Encounter for therapeutic drug level monitoring: Secondary | ICD-10-CM | POA: Diagnosis not present

## 2023-09-06 DIAGNOSIS — Z7689 Persons encountering health services in other specified circumstances: Secondary | ICD-10-CM | POA: Diagnosis not present

## 2023-09-10 ENCOUNTER — Ambulatory Visit: Payer: Medicaid Other | Admitting: Allergy

## 2023-09-10 DIAGNOSIS — R1319 Other dysphagia: Secondary | ICD-10-CM | POA: Diagnosis not present

## 2023-09-10 DIAGNOSIS — Z7689 Persons encountering health services in other specified circumstances: Secondary | ICD-10-CM | POA: Diagnosis not present

## 2023-09-10 DIAGNOSIS — K30 Functional dyspepsia: Secondary | ICD-10-CM | POA: Diagnosis not present

## 2023-09-11 ENCOUNTER — Ambulatory Visit: Payer: Medicaid Other | Admitting: Internal Medicine

## 2023-09-11 DIAGNOSIS — R519 Headache, unspecified: Secondary | ICD-10-CM | POA: Diagnosis not present

## 2023-09-11 DIAGNOSIS — Z7689 Persons encountering health services in other specified circumstances: Secondary | ICD-10-CM | POA: Diagnosis not present

## 2023-09-11 DIAGNOSIS — F0781 Postconcussional syndrome: Secondary | ICD-10-CM | POA: Diagnosis not present

## 2023-09-12 ENCOUNTER — Other Ambulatory Visit: Payer: Self-pay

## 2023-09-12 NOTE — Patient Outreach (Signed)
  Medicaid Managed Care   Unsuccessful Outreach Note  09/12/2023 Name: Nicholas Johnson. MRN: [id number] DOB: July 17, 1991  Referred by: [name], MD Reason for referral : High Risk Managed Medicaid ([employer] Social work unsuccessful telephone outreach )   An unsuccessful telephone outreach was attempted today. The patient was referred to the case management team for assistance with care management and care coordination.   Follow Up Plan: A HIPAA compliant phone message was left for the patient providing contact information and requesting a return call.   Abelino Derrick, MHA [facility]  Managed [facility] Social Worker [phone]

## 2023-09-12 NOTE — Patient Instructions (Signed)
  Medicaid Managed Care   Unsuccessful Outreach Note  09/12/2023 Name: Nicholas Johnson. MRN: [id number] DOB: July 17, 1991  Referred by: [name], MD Reason for referral : High Risk Managed Medicaid ([employer] Social work unsuccessful telephone outreach )   An unsuccessful telephone outreach was attempted today. The patient was referred to the case management team for assistance with care management and care coordination.   Follow Up Plan: A HIPAA compliant phone message was left for the patient providing contact information and requesting a return call.   Abelino Derrick, MHA [facility]  Managed [facility] Social Worker [phone]

## 2023-09-13 NOTE — Telephone Encounter (Signed)
Encounter Type MyChart secure digital messaging clinical encounter - Brief supportive care message and triage Chief Complaint Patient reports "worst headache of life" not responding to current medications Assessment/Plan  Given report of "worst headache of life," provided urgent care guidance including ER precautions Staff to contact patient for urgent appointment scheduling Provided supportive care measures pending evaluation  Please see the MyChart message reply(ies) for my assessment and plan. [name], MD

## 2023-09-17 ENCOUNTER — Telehealth: Payer: Medicaid Other | Admitting: Internal Medicine

## 2023-09-17 ENCOUNTER — Encounter: Payer: Self-pay | Admitting: Internal Medicine

## 2023-09-17 VITALS — Wt 125.0 lb

## 2023-09-17 DIAGNOSIS — F0781 Postconcussional syndrome: Secondary | ICD-10-CM | POA: Diagnosis not present

## 2023-09-17 DIAGNOSIS — E639 Nutritional deficiency, unspecified: Secondary | ICD-10-CM

## 2023-09-17 DIAGNOSIS — G44321 Chronic post-traumatic headache, intractable: Secondary | ICD-10-CM | POA: Diagnosis not present

## 2023-09-17 DIAGNOSIS — H9311 Tinnitus, right ear: Secondary | ICD-10-CM | POA: Insufficient documentation

## 2023-09-17 DIAGNOSIS — F451 Undifferentiated somatoform disorder: Secondary | ICD-10-CM

## 2023-09-17 DIAGNOSIS — K224 Dyskinesia of esophagus: Secondary | ICD-10-CM | POA: Diagnosis not present

## 2023-09-17 DIAGNOSIS — H9193 Unspecified hearing loss, bilateral: Secondary | ICD-10-CM

## 2023-09-17 DIAGNOSIS — Z59819 Housing instability, housed unspecified: Secondary | ICD-10-CM

## 2023-09-17 DIAGNOSIS — M5030 Other cervical disc degeneration, unspecified cervical region: Secondary | ICD-10-CM

## 2023-09-17 DIAGNOSIS — S14109D Unspecified injury at unspecified level of cervical spinal cord, subsequent encounter: Secondary | ICD-10-CM

## 2023-09-17 DIAGNOSIS — G43709 Chronic migraine without aura, not intractable, without status migrainosus: Secondary | ICD-10-CM

## 2023-09-17 DIAGNOSIS — H93299 Other abnormal auditory perceptions, unspecified ear: Secondary | ICD-10-CM

## 2023-09-17 NOTE — Progress Notes (Signed)
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 Level: High risk of complications and/or morbidity/mortality: - Severe post-concussion symptoms affecting cognitive function and job performance -  Progressive dysphagia with nutritional impact - Complex medication management with multiple drug interactions - Risk of serious complications from untreated conditions  B. Time Attestation: I have personally spent 40 minutes involved in face-to-face and non-face-to-face activities for this patient on the day of the visit.  Professional time spent includes the following activities: - Obtaining and documenting an extensive history complicated by cognitive symptoms - Analyzing complex medication interactions and treatment responses - Coordinating care with specialists and documenting care updated problem overview for multiple problem to improve longitudinal management  for coordinated care - Developing comprehensive treatment plan addressing multiple conditions - Providing detailed patient education and instructions - Completing necessary referrals and documentation  The extended duration of this patient visit was medically necessary due to: 1. Complex interaction of multiple chronic conditions requiring careful medication management 2. New neurological symptoms requiring evaluation and risk assessment 3. Need for extensive care coordination across multiple specialties 4. Cognitive difficulties requiring additional time for patient education and understanding 5. Complex social factors affecting care delivery and access  This documentation supports the use of G2211 add-on code as I am serving as the continuing focal point for all needed healthcare services for management of multiple serious chronic conditions.  Medical decision-making was complex due to: - Multiple serious conditions with interaction risks - Extensive data review from multiple sources - High-risk medication management decisions - Need for careful monitoring of status changes - Complex coordination of care requirements  All medical decision-making was performed independently by me, and all time spent was independent of any  separately billable procedures.  Signed: [name], MD 09/17/2023 6:54 PM
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
## 2023-09-17 NOTE — Assessment & Plan Note (Signed)
Progressive worsening of symptoms Failed manometry attempt Significant impact on nutrition status Plan:  Continue pantoprazole 40mg  BID Maintain soft diet and nutritional supplements Expedite GI follow-up for possible esophageal dilation Monitor weight and nutritional status

## 2023-09-17 NOTE — Assessment & Plan Note (Signed)
Right-sided echo phenomenon with associated discomfort Unclear etiology - possible medication side effect vs. post-concussive Plan:  Urgent referral to audiology for comprehensive evaluation Recommend urgent care visit for otoscopic examination Monitor for changes or worsening Document relationship to medication changes
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
## 2023-09-17 NOTE — Assessment & Plan Note (Signed)
Severe daily headaches significantly impacting function Poor response to multiple medication trials Plan:  Start Nurtec for acute episodes Trial Excedrin Migraine for immediate relief Current evening regimen: Zanaflex 4mg  + Imitrex 100mg  + Celebrex 200mg  Schedule follow-up for trigger point injections Consider Botox therapy pending neurology evaluation

## 2023-09-17 NOTE — Patient Instructions (Addendum)
Take zanaflex and Imitrex tonight, and Celebrex.  If fails try picking up Nurtec and Excedrin tomorrow and trying them.  AFTER VISIT SUMMARY  Your Visit Today: 09/17/2023 Provider: Dr. [PCP]  Important Instructions:  For Your Headache Tonight: - Take these medications together:   * Zanaflex (tizanidine)   * Imitrex (sumatriptan)   * Celebrex (celecoxib) - If headache persists, get these from pharmacy tomorrow:   * Nurtec (prescription will be sent)   * Excedrin Migraine (over-the-counter)  For Your Ear Symptoms: - Visit [facility] on [address] for ear examination - Hours: Open until 8 PM - Bring your insurance card - Tell them about the echo sensation and discomfort you're experiencing  Important Phone Numbers: - [[email or web address]] Nurse Connect (24/7): [phone] - Call if symptoms worsen significantly  Diet Instructions: - Continue soft foods and liquid supplements - Avoid foods that are hard to swallow - Keep taking nutritional supplements  Follow-Up Appointments: 1. Call our office tomorrow morning to schedule follow-up 2. Keep your scheduled GI appointment 3. Keep your January appointment at [facility] 4. Audiology appointment will be scheduled (we will call you)  Warning Signs - Seek Immediate Care If: - Severe headache that is different from usual - New neurological symptoms - Difficulty breathing - Severe throat tightness - Unable to swallow liquids  Medication Changes: - New prescription for Nurtec being sent to your pharmacy - Continue all other current medications as prescribed  Transportation Options: - Bus route information provided - Consider scheduling medical transport for distant appointments - Keep track of transportation receipts  Next Steps: 1. Take evening medications as directed 2. Visit urgent care for ear examination when possible 3. Pick up new prescriptions tomorrow 4. Call our office in morning to schedule  follow-up 5. Continue working with specialists as scheduled  Remember: - Take medications exactly as prescribed - Keep all scheduled appointments - Document any new or worsening symptoms - Stay hydrated and maintain nutrition with soft foods and supplements - Rest when needed, especially during work hours - Use provided resources for support

## 2023-09-21 ENCOUNTER — Telehealth: Payer: Self-pay | Admitting: Pharmacy Technician

## 2023-09-21 ENCOUNTER — Other Ambulatory Visit (HOSPITAL_COMMUNITY): Payer: Self-pay

## 2023-09-21 NOTE — Telephone Encounter (Signed)
Pharmacy Patient Advocate Encounter   Received notification from CoverMyMeds that prior authorization for Zavzpret 10MG /ACT solution is required/requested.   Insurance verification completed.   The patient is insured through [facility] [place] .   Per test claim: PA required; PA submitted to above mentioned insurance via CoverMyMeds Key/confirmation #/[id number] Status is pending

## 2023-09-23 ENCOUNTER — Telehealth: Payer: Self-pay | Admitting: Pulmonary Disease

## 2023-09-23 DIAGNOSIS — Z419 Encounter for procedure for purposes other than remedying health state, unspecified: Secondary | ICD-10-CM | POA: Diagnosis not present

## 2023-09-23 NOTE — Telephone Encounter (Signed)
Patient called answering service complaining of strokelike symptoms  He states he has been having a headache, concerned about some of his movements   I did let him know that these are not symptoms that his pulmonologist will be able to address He should either seek help at an urgent care/emergency room with neurological symptoms, encouraged to call his primary doctor as well

## 2023-09-24 ENCOUNTER — Telehealth: Payer: Self-pay | Admitting: Internal Medicine
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
## 2023-09-24 NOTE — Telephone Encounter (Signed)
Pharmacy Patient Advocate Encounter  Received notification from [facility] Medicaid that Prior Authorization for ZAVZPRET 10MG /ACT has been DENIED.  Full denial letter will be uploaded to the media tab. See denial reason below.   PA #/Case ID/Reference #: [id number]   DENIAL REASON: At least 2 preferred drugs must be tried before requesting this drug. Preferred alternatives: Nurtec ODT and Bernita Raisin (both require prior authorization)

## 2023-09-24 NOTE — Telephone Encounter (Signed)
Called again, please see message below

## 2023-09-26 ENCOUNTER — Ambulatory Visit: Payer: Medicaid Other | Admitting: Internal Medicine

## 2023-09-26 DIAGNOSIS — R09A2 Foreign body sensation, throat: Secondary | ICD-10-CM | POA: Diagnosis not present

## 2023-09-26 DIAGNOSIS — K224 Dyskinesia of esophagus: Secondary | ICD-10-CM | POA: Diagnosis not present

## 2023-09-27 ENCOUNTER — Ambulatory Visit: Payer: Medicaid Other | Admitting: Internal Medicine

## 2023-09-27 NOTE — Telephone Encounter (Signed)
Had Nicholas Johnson to schedule patient for an VV on 12/9. Patient wants to also keep his OV on 12/10.

## 2023-10-01 ENCOUNTER — Telehealth: Payer: Medicaid Other | Admitting: Internal Medicine

## 2023-10-01 NOTE — Telephone Encounter (Signed)
 Information has been sent to clinical pharmacist for appeals review. It may take 5-7 days to prepare the necessary documentation to request the appeal from the insurance.

## 2023-10-01 NOTE — Telephone Encounter (Signed)
Appeal has been submitted for Zayzpret. Will advise when response is received. Please be advised that most companies may take 30 days to make a decision. Appeal letter and all pertinent information was faxed to [phone] on 10/01/2023 @1 :02pm.  Dellie Burns, PharmD Clinical Pharmacist    Direct Dial: [phone]

## 2023-10-02 ENCOUNTER — Telehealth: Payer: Medicaid Other | Admitting: Internal Medicine

## 2023-10-02 ENCOUNTER — Ambulatory Visit: Payer: Medicaid Other | Admitting: Internal Medicine

## 2023-10-02 ENCOUNTER — Encounter: Payer: Self-pay | Admitting: Internal Medicine

## 2023-10-02 DIAGNOSIS — K224 Dyskinesia of esophagus: Secondary | ICD-10-CM | POA: Diagnosis not present

## 2023-10-02 DIAGNOSIS — J320 Chronic maxillary sinusitis: Secondary | ICD-10-CM

## 2023-10-02 DIAGNOSIS — R09A2 Foreign body sensation, throat: Secondary | ICD-10-CM | POA: Diagnosis not present

## 2023-10-02 DIAGNOSIS — F0781 Postconcussional syndrome: Secondary | ICD-10-CM | POA: Diagnosis not present

## 2023-10-02 DIAGNOSIS — Z008 Encounter for other general examination: Secondary | ICD-10-CM | POA: Diagnosis not present

## 2023-10-02 DIAGNOSIS — G44209 Tension-type headache, unspecified, not intractable: Secondary | ICD-10-CM | POA: Diagnosis not present

## 2023-10-02 DIAGNOSIS — H5713 Ocular pain, bilateral: Secondary | ICD-10-CM

## 2023-10-02 DIAGNOSIS — R1319 Other dysphagia: Secondary | ICD-10-CM | POA: Diagnosis not present

## 2023-10-02 DIAGNOSIS — G43011 Migraine without aura, intractable, with status migrainosus: Secondary | ICD-10-CM

## 2023-10-02 MED ORDER — TIZANIDINE HCL 4 MG PO TABS
4.0000 mg | ORAL_TABLET | Freq: Four times a day (QID) | ORAL | 5 refills | Status: DC | PRN
Start: 2023-10-02 — End: 2023-10-10

## 2023-10-02 MED ORDER — ZAVZPRET 10 MG/ACT NA SOLN: 1.0000 | Freq: Every day | NASAL | 2 refills | Status: DC

## 2023-10-02 MED ORDER — AIMOVIG 140 MG/ML ~~LOC~~ SOAJ: 140.0000 mg | SUBCUTANEOUS | 11 refills | Status: DC

## 2023-10-02 MED ORDER — SUMATRIPTAN SUCCINATE 6 MG/0.5ML ~~LOC~~ SOLN: 6.0000 mg | Freq: Every day | SUBCUTANEOUS | 0 refills | Status: DC | PRN

## 2023-10-02 MED ORDER — KETOROLAC TROMETHAMINE 0.5 % OP SOLN: 1.0000 [drp] | Freq: Four times a day (QID) | OPHTHALMIC | 0 refills | Status: DC

## 2023-10-02 MED ORDER — NURTEC 75 MG PO TBDP: 1.0000 | ORAL_TABLET | Freq: Every day | ORAL | 11 refills | Status: DC
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
## 2023-10-02 NOTE — Assessment & Plan Note (Signed)
Likely cause of migraines, extensive evaluation and traumatic brain injury clinic referral.

## 2023-10-02 NOTE — Assessment & Plan Note (Signed)
Possible contributor to migraines

## 2023-10-02 NOTE — Patient Instructions (Signed)
It was a pleasure seeing you today! Your health and satisfaction are our top priorities.   Glenetta Hew, MD  Next Steps:  [x]  Flexible Follow-Up: We recommend a follow up with your primary care, a specialist, or me within 1-2 weeks for ensuring your problem resolves. This allows for progress monitoring and treatment adjustments. [x]  Early Intervention: Schedule sooner appointment, call our on-call services, or go to emergency room if there is Increase in pain or discomfort New or worsening symptoms Sudden or severe changes in your health [x]  Lab & X-ray Appointments: complete or schedule to complete today, or call to schedule.  X-rays: [facility] (M-F, 8:30am-noon or 1pm-5pm).  Making the Most of Our Focused (20 minute) Follow Up Appointments:  [x]   Clearly state your top concerns at the beginning of the visit to focus our discussion [x]   If you anticipate you will need more time, please inform the front desk during scheduling - we can book multiple appointments in the same week. [x]   If you have transportation problems- use our convenient video appointments or ask about transportation support. [x]   We can get down to business faster if you use MyChart to update information before the visit and submit non-urgent questions before your visit. Thank you for taking the time to provide details through MyChart.  Let our nurse know and she can import this information into your encounter documents.  Arrival and Wait Times: [x]   Arriving on time ensures that everyone receives prompt attention. [x]   Early morning (8a) and afternoon (1p) appointments tend to have shortest wait times. [x]   Unfortunately, we cannot delay appointments for late arrivals or hold slots during phone calls.  Getting Answers and Following Up  [x]   Simple Questions & Concerns: For quick questions or basic follow-up after your visit, reach [place] at [phone] or MyChart messaging. [x]   Complex Concerns: If  your concern is more complex, scheduling an appointment might be best. Discuss this with the staff to find the most suitable option. [x]   Lab & Imaging Results: We'll contact you directly if results are abnormal or you don't use MyChart. Most normal results will be on MyChart within 2-3 business days, with a review message from Dr. Jon Billings. Haven't heard back in 2 weeks? Need results sooner? Contact us at [phone]. [x]   Referrals: Our referral coordinator will manage specialist referrals. The specialist's office should contact you within 2 weeks to schedule an appointment. Call us if you haven't heard from them after 2 weeks.  Staying Connected  [x]   MyChart: Activate your MyChart for the fastest way to access results and message [place]. See the last page of this paperwork for instructions on how to activate.  Bring to Your Next Appointment  [x]   Medications: Please bring all your medication bottles to your next appointment to ensure we have an accurate record of your prescriptions. [x]   Health Diaries: If you're monitoring any health conditions at home, keeping a diary of your readings can be very helpful for discussions at your next appointment.  Billing  [x]   X-ray & Lab Orders: These are billed by separate companies. Contact the invoicing company directly for questions or concerns. [x]   Visit Charges: Discuss any billing inquiries with our administrative services team.  Your Satisfaction Matters  [x]   Share Your Experience: We strive for your satisfaction! If you have any complaints, or preferably compliments, please let Dr. Jon Billings know directly or contact our Practice Administrators, Edwena Felty or [employer], by  asking at the front desk.   Reviewing Your Records  [x]   Review this early draft of your clinical encounter notes below and the final encounter summary tomorrow on MyChart after its been completed.   Intractable migraine without aura and with status migrainosus -      Zavzpret; Place 1 spray into the nose daily at 6 (six) AM.  Dispense: 6 each; Refill: 2 -     Nurtec; Take 1 tablet (75 mg total) by mouth daily at 6 (six) AM.  Dispense: 9 tablet; Refill: 11 -     Aimovig; Inject 140 mg into the skin every 30 (thirty) days.  Dispense: 4 mL; Refill: 11  Eye pain, bilateral -     Ketorolac Tromethamine; Place 1 drop into both eyes 4 (four) times daily.  Dispense: 5 mL; Refill: 0  Postconcussion syndrome  Chronic maxillary sinusitis -     SUMAtriptan Succinate; Inject 0.5 mLs (6 mg total) into the skin daily as needed for migraine or headache.  Dispense: 4 mL; Refill: 0  Tension headache -     tiZANidine HCl; Take 1 tablet (4 mg total) by mouth every 6 (six) hours as needed for muscle spasms.  Dispense: 60 tablet; Refill: 5  Esophageal dysmotility

## 2023-10-02 NOTE — Assessment & Plan Note (Signed)
Reviewed and agree with 09/26/23 History of Present Illness from gastrointestinal:  The patient is a 32 year old male with history of cervicogenic headaches, postconcussion syndrome, unspecified dysphagia who presents for follow-up of his issues with swallowing, food intolerance, and recent EGD with manometry showing ineffective esophageal motility. He has seen motility specialist, Dr. Ebony Cargo, for his symptoms.   Since his visit with Dr. Ebony Cargo, he continues to experience sensation of liquid getting stuck in his throat when swallowing associated with cough, with concerns that he is aspirating the fluid. More often than not he is able to swallow everything alright, but this issue is increasing in frequency and concerning to Nicholas Johnson. When he does eat solid foods, he is chewing appropriately but is not always successful in swallowing. He has not weighed himself since his last clinic visit, but thinks he is around 128 lbs.   He also is now employed at an [employer], which is where he completed the visit from. Currently has Endoflip with Bravo and EGD with empiric dilation scheduled on 10/15/23  Assessment & Plan:  Patient is a 32 year old male with ineffective esophageal motility who has not responded to PPI thus far. This might no totally explain his symptoms as there is likely a component of globus pharyngeus. Currently, patient is scheduled with Dr. Ebony Cargo for Endoflip, Bravo, and empiric dilation. We discussed meeting with speech therapy for swallowing strategies but Nicholas Johnson decl as he did not feel it would be beneficialined  Plan: - continue to consume liquid foods and introduce solid foods as tolerated - proceed with EGD w/ dilation, Bravo, and Endoflip with Dr. Ebony Cargo - further recommendations can be given after procedures

## 2023-10-02 NOTE — Telephone Encounter (Signed)
Information regarding patients esophageal issues was submitted in the appeal letter to the insurance.  They would now like to know if the patient has tried the Nurtec ORALLY DISINTEGRATING TABLETS or if there is a specific reason these are contraindicated in the patient.  Please advise.

## 2023-10-02 NOTE — Assessment & Plan Note (Signed)
>>  ASSESSMENT AND PLAN FOR INTRACTABLE MIGRAINE WITHOUT AURA AND WITH STATUS MIGRAINOSUS WRITTEN ON 10/02/2023 10:21 AM BY Balinda Heacock G, MD  Main issue at this time is debilitating migraine pain Has not gotten relief from Celebrex /goodies and many other medications Will send a bunch of medications to pharmacy in attempt to get at least 1 or 2 of the medications that we previously weren't able to obtain for him, he will follow up later this week or next week or go to emergency room if it becomes unbearable

## 2023-10-02 NOTE — Assessment & Plan Note (Signed)
Main issue at this time is debilitating migraine pain Has not gotten relief from Celebrex/goodies and many other medications Will send a bunch of medications to pharmacy in attempt to get at least 1 or 2 of the medications that we previously weren't able to obtain for him, he will follow up later this week or next week or go to emergency room if it becomes unbearable

## 2023-10-04 DIAGNOSIS — R4189 Other symptoms and signs involving cognitive functions and awareness: Secondary | ICD-10-CM | POA: Diagnosis not present

## 2023-10-04 DIAGNOSIS — S069XAS Unspecified intracranial injury with loss of consciousness status unknown, sequela: Secondary | ICD-10-CM | POA: Diagnosis not present

## 2023-10-05 ENCOUNTER — Telehealth: Payer: Self-pay | Admitting: Internal Medicine

## 2023-10-05 ENCOUNTER — Encounter: Payer: Self-pay | Admitting: Internal Medicine

## 2023-10-05 DIAGNOSIS — R490 Dysphonia: Secondary | ICD-10-CM | POA: Diagnosis not present

## 2023-10-05 DIAGNOSIS — R1319 Other dysphagia: Secondary | ICD-10-CM | POA: Diagnosis not present

## 2023-10-05 DIAGNOSIS — K219 Gastro-esophageal reflux disease without esophagitis: Secondary | ICD-10-CM | POA: Diagnosis not present

## 2023-10-05 DIAGNOSIS — Z7689 Persons encountering health services in other specified circumstances: Secondary | ICD-10-CM | POA: Diagnosis not present

## 2023-10-05 NOTE — Telephone Encounter (Signed)
Spoke with pt, pt states that he is having severe eye pain/pressure in both eyes, blurriness more in right eye, pt also c/o of tightness in eyes, pt unable to schedule eye appt, and is very concerned. Pt states that he has had this issue for a while, however within the past few days sxs has become worse. Dr Jon Billings can you please advise? Thank You

## 2023-10-05 NOTE — Telephone Encounter (Signed)
Patient requests to be called-states it is urgent re: new symptoms with his eyes and is unable to get appointment with [facility].

## 2023-10-08 ENCOUNTER — Ambulatory Visit: Payer: Medicaid Other | Admitting: Internal Medicine

## 2023-10-09 ENCOUNTER — Encounter: Payer: Self-pay | Admitting: Internal Medicine

## 2023-10-09 ENCOUNTER — Telehealth (INDEPENDENT_AMBULATORY_CARE_PROVIDER_SITE_OTHER): Payer: Medicaid Other | Admitting: Internal Medicine

## 2023-10-09 DIAGNOSIS — H5713 Ocular pain, bilateral: Secondary | ICD-10-CM

## 2023-10-09 DIAGNOSIS — G894 Chronic pain syndrome: Secondary | ICD-10-CM | POA: Diagnosis not present

## 2023-10-09 DIAGNOSIS — R41840 Attention and concentration deficit: Secondary | ICD-10-CM

## 2023-10-09 DIAGNOSIS — G44329 Chronic post-traumatic headache, not intractable: Secondary | ICD-10-CM

## 2023-10-09 DIAGNOSIS — G43011 Migraine without aura, intractable, with status migrainosus: Secondary | ICD-10-CM

## 2023-10-09 DIAGNOSIS — Z59819 Housing instability, housed unspecified: Secondary | ICD-10-CM

## 2023-10-09 MED ORDER — DULOXETINE HCL 20 MG PO CPEP: 20.0000 mg | ORAL_CAPSULE | Freq: Every day | ORAL | 3 refills | Status: DC
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
## 2023-10-09 NOTE — Assessment & Plan Note (Signed)
Post-Concussive Syndrome They exhibit chronic symptoms following a head injury, including migraines, eye pressure, trouble focusing, and neurological pain, which worsen with stress and certain activities. A neuro-ophthalmologist suspects a nerve injury affecting their vision. They have concerns about the effectiveness and side effects of treatments, especially trigger point injections. We discussed using Lyrica for neurological pain and Cymbalta for both nerve pain and mood stabilization, noting trigger point injections are generally safe despite potential temporary discomfort. We will continue Lyrica and prescribe Cymbalta. They will receive a second opinion from a neuro-ophthalmologist, proceed with trigger point injections with Dr. Montez Morita, and have referrals to a post-concussive clinic, [facility], and [facility] for brain injury services.

## 2023-10-09 NOTE — Patient Instructions (Addendum)
VISIT SUMMARY:  During today's visit, we discussed your ongoing symptoms related to your traumatic brain injury, including headaches, visual disturbances, and difficulty focusing. We also addressed your chronic pain and concerns about potential side effects of treatments. Additionally, we talked about your increased distractibility and potential ADHD symptoms. We have made several referrals to specialists to help manage your conditions.  YOUR PLAN:  -POST-CONCUSSIVE SYNDROME: Post-concussive syndrome refers to the lingering symptoms following a head injury, such as headaches, visual disturbances, and difficulty focusing. We will continue/restart your current medication, Lyrica, and add Cymbalta to help with nerve pain and mood stabilization. You will also proceed with trigger point injections with Dr. Montez Morita and follow up with a neuro-ophthalmologist for a second opinion. Referrals have been made to a post-concussive clinic, [facility], and [facility] for additional support.  -CHRONIC PAIN: Chronic pain in the back of your head and neck is likely related to your post-concussive syndrome. We will continue with Lyrica for pain management and proceed with trigger point injections with Dr. Montez Morita. If these treatments are not effective, we will consider alternative pain management strategies.  -ADHD (ATTENTION-DEFICIT/HYPERACTIVITY DISORDER): ADHD is a condition characterized by difficulty focusing and increased distractibility. We discussed the potential benefits of a psychiatric evaluation and management. You will be referred to [facility] for a psychiatric evaluation to help manage your ADHD symptoms.  -GENERAL HEALTH MAINTENANCE: It is important to stay hydrated, especially to manage dry mouth from Lyrica. Please follow up with the recommended specialists and clinics to manage your chronic conditions.  INSTRUCTIONS:  Please follow up with the neuro-ophthalmologist for a second  opinion, proceed with trigger point injections with Dr. Montez Morita, and contact the post-concussive clinic, [facility], and [facility] for brain injury services. Additionally, schedule an appointment with [facility] for a psychiatric evaluation to manage your ADHD symptoms. We will see you again next week for a follow-up appointment.

## 2023-10-09 NOTE — Assessment & Plan Note (Signed)
>>  ASSESSMENT AND PLAN FOR POST-TRAUMATIC HEADACHE, NOT INTRACTABLE WRITTEN ON 10/09/2023  4:41 PM BY Poseidon Pam G, MD  Post-Concussive Syndrome They exhibit chronic symptoms following a head injury, including migraines, eye pressure, trouble focusing, and neurological pain, which worsen with stress and certain activities. A neuro-ophthalmologist suspects a nerve injury affecting their vision. They have concerns about the effectiveness and side effects of treatments, especially trigger point injections. We discussed using Lyrica  for neurological pain and Cymbalta  for both nerve pain and mood stabilization, noting trigger point injections are generally safe despite potential temporary discomfort. We will continue Lyrica  and prescribe Cymbalta . They will receive a second opinion from a neuro-ophthalmologist, proceed with trigger point injections with Dr. Davidson Hammans, and have referrals to a post-concussive clinic, [facility], and [facility] for brain injury services.

## 2023-10-09 NOTE — Assessment & Plan Note (Signed)
Chronic Pain They report persistent pain in the back of the head and neck, likely stemming from post-concussive syndrome, with concerns about the effectiveness and potential worsening of symptoms from trigger point injections. Lyrica may offer immediate relief. We will proceed with trigger point injections with Dr. Montez Morita and continue Lyrica for pain management, considering alternative pain management strategies if current treatments prove ineffective.

## 2023-10-10 ENCOUNTER — Ambulatory Visit (INDEPENDENT_AMBULATORY_CARE_PROVIDER_SITE_OTHER): Payer: Medicaid Other | Admitting: Allergy

## 2023-10-10 ENCOUNTER — Encounter: Payer: Self-pay | Admitting: Allergy

## 2023-10-10 ENCOUNTER — Other Ambulatory Visit: Payer: Self-pay

## 2023-10-10 VITALS — BP 108/62 | HR 78 | Temp 98.6°F | Ht 66.93 in | Wt 127.1 lb

## 2023-10-10 DIAGNOSIS — J310 Chronic rhinitis: Secondary | ICD-10-CM | POA: Diagnosis not present

## 2023-10-10 DIAGNOSIS — Z7689 Persons encountering health services in other specified circumstances: Secondary | ICD-10-CM | POA: Diagnosis not present

## 2023-10-10 NOTE — Patient Instructions (Signed)
Rhinitis  Return for allergy skin testing. Will make additional recommendations based on results. Make sure you don't take any antihistamines for 3 days before the skin testing appointment. Don't put any lotion on the back and arms on the day of testing.  Plan on being here for 30-60 minutes.
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
## 2023-10-11 ENCOUNTER — Ambulatory Visit: Payer: Medicaid Other | Admitting: Internal Medicine

## 2023-10-11 ENCOUNTER — Telehealth: Payer: Self-pay | Admitting: Internal Medicine

## 2023-10-11 NOTE — Telephone Encounter (Signed)
Copied from CRM [id number]. Topic: Referral - Question >> Oct 11, 2023 10:40 AM Josefa Half C wrote: Reason for CRM: Please call [facility] regarding sent in referral(#[id number]) for patient. The referred provider is not sure what patient is needing to be seen for and the patient is not sure why his primary care provider sent the referral. Contact for [facility] is [phone] they are out of office for lunch during 12:00pm-1:00pm

## 2023-10-12 ENCOUNTER — Ambulatory Visit: Payer: Medicaid Other | Admitting: Internal Medicine

## 2023-10-12 DIAGNOSIS — X58XXXD Exposure to other specified factors, subsequent encounter: Secondary | ICD-10-CM | POA: Diagnosis not present

## 2023-10-12 DIAGNOSIS — Z79899 Other long term (current) drug therapy: Secondary | ICD-10-CM | POA: Diagnosis not present

## 2023-10-12 DIAGNOSIS — G8929 Other chronic pain: Secondary | ICD-10-CM | POA: Diagnosis not present

## 2023-10-12 DIAGNOSIS — G4486 Cervicogenic headache: Secondary | ICD-10-CM | POA: Diagnosis not present

## 2023-10-12 DIAGNOSIS — Z7689 Persons encountering health services in other specified circumstances: Secondary | ICD-10-CM | POA: Diagnosis not present

## 2023-10-12 DIAGNOSIS — M542 Cervicalgia: Secondary | ICD-10-CM | POA: Diagnosis not present

## 2023-10-12 DIAGNOSIS — S069XAD Unspecified intracranial injury with loss of consciousness status unknown, subsequent encounter: Secondary | ICD-10-CM | POA: Diagnosis not present

## 2023-10-12 DIAGNOSIS — G44329 Chronic post-traumatic headache, not intractable: Secondary | ICD-10-CM | POA: Diagnosis not present

## 2023-10-12 DIAGNOSIS — F0781 Postconcussional syndrome: Secondary | ICD-10-CM | POA: Diagnosis not present

## 2023-10-15 ENCOUNTER — Encounter: Payer: Self-pay | Admitting: Neurology

## 2023-10-15 DIAGNOSIS — K2100 Gastro-esophageal reflux disease with esophagitis, without bleeding: Secondary | ICD-10-CM | POA: Diagnosis not present

## 2023-10-15 DIAGNOSIS — K2950 Unspecified chronic gastritis without bleeding: Secondary | ICD-10-CM | POA: Diagnosis not present

## 2023-10-15 DIAGNOSIS — R1319 Other dysphagia: Secondary | ICD-10-CM | POA: Diagnosis not present

## 2023-10-15 DIAGNOSIS — R09A2 Foreign body sensation, throat: Secondary | ICD-10-CM | POA: Diagnosis not present

## 2023-10-15 NOTE — Telephone Encounter (Signed)
Spoke with [employer] [facility] [employer] and clarified the reason for the referral, which is Dr. Kandra Nicolas concern that he has a memory issue. Attempted to call patient to inform him and left a vm for him to call the office back concerning this or respond to the my chart message that I sent (clarified the reason for the referral).

## 2023-10-16 DIAGNOSIS — R09A2 Foreign body sensation, throat: Secondary | ICD-10-CM | POA: Diagnosis not present

## 2023-10-16 DIAGNOSIS — R1319 Other dysphagia: Secondary | ICD-10-CM | POA: Diagnosis not present

## 2023-10-18 ENCOUNTER — Ambulatory Visit (INDEPENDENT_AMBULATORY_CARE_PROVIDER_SITE_OTHER): Payer: Medicaid Other | Admitting: Internal Medicine

## 2023-10-18 ENCOUNTER — Ambulatory Visit: Payer: Medicaid Other | Admitting: Internal Medicine

## 2023-10-18 ENCOUNTER — Encounter: Payer: Self-pay | Admitting: Internal Medicine

## 2023-10-18 VITALS — BP 109/67 | HR 61 | Temp 97.2°F | Ht 66.93 in | Wt 127.4 lb

## 2023-10-18 DIAGNOSIS — Z7689 Persons encountering health services in other specified circumstances: Secondary | ICD-10-CM | POA: Diagnosis not present

## 2023-10-18 DIAGNOSIS — G4486 Cervicogenic headache: Secondary | ICD-10-CM | POA: Diagnosis not present

## 2023-10-18 DIAGNOSIS — G43811 Other migraine, intractable, with status migrainosus: Secondary | ICD-10-CM

## 2023-10-18 DIAGNOSIS — G44321 Chronic post-traumatic headache, intractable: Secondary | ICD-10-CM

## 2023-10-18 DIAGNOSIS — G44209 Tension-type headache, unspecified, not intractable: Secondary | ICD-10-CM | POA: Diagnosis not present

## 2023-10-18 DIAGNOSIS — K224 Dyskinesia of esophagus: Secondary | ICD-10-CM | POA: Diagnosis not present

## 2023-10-18 MED ORDER — UBRELVY 50 MG PO TABS: 1.0000 | ORAL_TABLET | Freq: Every day | ORAL | 1 refills | Status: DC

## 2023-10-18 MED ORDER — CYCLOBENZAPRINE HCL 10 MG PO TABS: 10.0000 mg | ORAL_TABLET | Freq: Three times a day (TID) | ORAL | 0 refills | Status: DC | PRN

## 2023-10-18 NOTE — Progress Notes (Signed)
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 mouth. (Patient not taking: Reported on 10/18/2023)   No current facility-administered medications on file prior to visit.  There are no discontinued medications.    Objective   Physical Exam     10/18/2023    8:00 AM 10/10/2023    1:59 PM 09/17/2023    8:15 AM  Vitals with BMI  Height 5' 6.93" 5' 6.929"   Weight 127 lbs 6 oz 127 lbs 2 oz 125 lbs  BMI 20 19.95 19.01  Systolic 109 108   Diastolic 67 62   Pulse 61 78    Wt Readings from Last 10 Encounters:  10/18/23 127 lb 6.4 oz (57.8 kg)  10/10/23 127 lb 1.6 oz (57.7 kg)  09/17/23 125 lb (56.7 kg)  08/31/23 125 lb (56.7 kg)  08/03/23 128 lb 10.1 oz (58.3 kg)  07/27/23 129 lb 6.4 oz (58.7 kg)  07/16/23 127 lb 12.8 oz (58 kg)  06/12/23 125 lb 3.2 oz (56.8 kg)  06/07/23 124 lb 9.6 oz (56.5 kg)  04/16/23 122 lb (55.3 kg)   Vital signs reviewed.  Nursing notes reviewed. Weight trend reviewed. Abnormalities and Problem-Specific physical exam findings:    General Appearance:  No acute distress appreciable.   Well-groomed, healthy-appearing  male.  Well proportioned with no abnormal fat distribution.  Good muscle tone. Pulmonary:  Normal  work of breathing at rest, no respiratory distress apparent. SpO2: 100 %  Musculoskeletal: All extremities are intact.  Neurological:  Awake, alert, oriented, and engaged.  No obvious focal neurological deficits or cognitive impairments.  Sensorium seems unclouded.   Speech is clear and coherent with logical content. Psychiatric:  Appropriate mood, pleasant and cooperative demeanor, thoughtful and engaged during the exam    No results found for any visits on 10/18/23. Office Visit on 07/16/2023  Component Date Value   WBC 07/16/2023 5.1    RBC 07/16/2023 5.13    Hemoglobin 07/16/2023 14.4    HCT 07/16/2023 43.9    MCV 07/16/2023 85.7    MCHC 07/16/2023 32.9    RDW 07/16/2023 13.0    Platelets 07/16/2023 181.0    Neutrophils Relative % 07/16/2023 60.6    Lymphocytes Relative 07/16/2023 29.4    Monocytes Relative 07/16/2023 8.1    Eosinophils Relative 07/16/2023 0.7    Basophils Relative 07/16/2023 1.2    Neutro Abs 07/16/2023 3.1    Lymphs Abs 07/16/2023 1.5    Monocytes Absolute 07/16/2023 0.4    Eosinophils Absolute 07/16/2023 0.0    Basophils Absolute 07/16/2023 0.1    Sodium 07/16/2023 143    Potassium 07/16/2023 4.8    Chloride 07/16/2023 102    CO2 07/16/2023 32    Glucose, Bld 07/16/2023 78    BUN 07/16/2023 17    Creatinine, Ser 07/16/2023 0.91    Total Bilirubin 07/16/2023 0.3    Alkaline Phosphatase 07/16/2023 62    AST 07/16/2023 20    ALT 07/16/2023 13    Total Protein 07/16/2023 7.8    Albumin 07/16/2023 4.7    GFR 07/16/2023 112.46    Calcium 07/16/2023 10.1    Folate 07/16/2023 >24.2    Sed Rate 07/16/2023 15    TSH 07/16/2023 2.35    Color, Urine 07/16/2023 YELLOW    APPearance 07/16/2023 CLEAR    Specific Gravity, Urine 07/16/2023 1.025    pH 07/16/2023 6.5    Total Protein, Urine 07/16/2023 NEGATIVE    Urine Glucose 07/16/2023 NEGATIVE    Ketones, ur  07/16/2023 NEGATIVE    Bilirubin Urine 07/16/2023 NEGATIVE    Hgb urine dipstick 07/16/2023 NEGATIVE    Urobilinogen, UA 07/16/2023 0.2    Leukocytes,Ua 07/16/2023 NEGATIVE    Nitrite 07/16/2023 NEGATIVE    WBC, UA 07/16/2023 0-2/hpf    RBC / HPF 07/16/2023 none seen    Mucus, UA 07/16/2023 Presence of (A)    Vitamin B-12 07/16/2023 587   Admission on 04/12/2023, Discharged on 04/12/2023  Component Date Value   WBC 04/12/2023 5.0    RBC 04/12/2023 5.03    Hemoglobin 04/12/2023 14.5    HCT 04/12/2023 40.9    MCV 04/12/2023 81.3    MCH 04/12/2023 28.8    MCHC 04/12/2023 35.5    RDW 04/12/2023 11.9    Platelets 04/12/2023 168    nRBC 04/12/2023 0.0    Neutrophils Relative % 04/12/2023 64    Neutro Abs 04/12/2023 3.2    Lymphocytes Relative 04/12/2023 28    Lymphs Abs 04/12/2023 1.4    Monocytes Relative 04/12/2023 8    Monocytes Absolute 04/12/2023 0.4    Eosinophils Relative 04/12/2023 0    Eosinophils Absolute 04/12/2023 0.0    Basophils Relative 04/12/2023 0    Basophils Absolute 04/12/2023 0.0    Immature Granulocytes 04/12/2023 0    Abs Immature Granulocytes 04/12/2023 0.01    Sodium 04/12/2023 140    Potassium 04/12/2023 3.7  Chloride 04/12/2023 104    CO2 04/12/2023 28    Glucose, Bld 04/12/2023 112 (H)    BUN 04/12/2023 12    Creatinine, Ser 04/12/2023 0.79    Calcium 04/12/2023 9.9    GFR, Estimated 04/12/2023 >60    Anion gap 04/12/2023 8   Office Visit on 03/05/2023  Component Date Value   Vitamin B-12 03/05/2023 533    Folate 03/05/2023 >23.9    Methylmalonic Acid, Quant 03/05/2023 83 (L)    Ferritin 03/05/2023 35.8    WBC 03/05/2023 4.1    RBC 03/05/2023 4.84    Hemoglobin 03/05/2023 14.1    HCT 03/05/2023 41.5    MCV 03/05/2023 85.6    MCHC 03/05/2023 34.0    RDW 03/05/2023 13.3    Platelets 03/05/2023 190.0    Neutrophils Relative % 03/05/2023 55.7    Lymphocytes Relative 03/05/2023 31.6    Monocytes Relative 03/05/2023 10.7    Eosinophils  Relative 03/05/2023 1.0    Basophils Relative 03/05/2023 1.0    Neutro Abs 03/05/2023 2.3    Lymphs Abs 03/05/2023 1.3    Monocytes Absolute 03/05/2023 0.4    Eosinophils Absolute 03/05/2023 0.0    Basophils Absolute 03/05/2023 0.0    Sodium 03/05/2023 140    Potassium 03/05/2023 4.5    Chloride 03/05/2023 101    CO2 03/05/2023 30    Glucose, Bld 03/05/2023 74    BUN 03/05/2023 10    Creatinine, Ser 03/05/2023 0.72    GFR 03/05/2023 122.22    Calcium 03/05/2023 9.9    TSH 03/05/2023 0.98    Magnesium 03/05/2023 1.9   Admission on 01/30/2023, Discharged on 01/30/2023  Component Date Value   Sodium 01/30/2023 140    Potassium 01/30/2023 4.2    Chloride 01/30/2023 101    CO2 01/30/2023 27    Glucose, Bld 01/30/2023 108 (H)    BUN 01/30/2023 <5 (L)    Creatinine, Ser 01/30/2023 0.88    Calcium 01/30/2023 9.6    GFR, Estimated 01/30/2023 >60    Anion gap 01/30/2023 12   Clinical Support on 01/16/2023  Component Date Value   TB Skin Test 01/18/2023 Negative    Induration 01/18/2023 0   Lab on 01/16/2023  Component Date Value   Anti Nuclear Antibody (A* 01/16/2023 NEGATIVE    ds DNA Ab 01/16/2023 1    Scleroderma (Scl-70) (EN* 01/16/2023 <1.0 NEG    ENA SM Ab Ser-aCnc 01/16/2023 <1.0 NEG    SM/RNP 01/16/2023 <1.0 NEG    SSA (Ro) (ENA) Antibody,* 01/16/2023 <1.0 NEG    SSB (La) (ENA) Antibody,* 01/16/2023 <1.0 NEG    CRP 01/16/2023 <1.0    Sed Rate 01/16/2023 4    VITD 01/16/2023 24.94 (L)    Vitamin B-12 01/16/2023 524    Folate 01/16/2023 17.1    HIV 1&2 Ab, 4th Generati* 01/16/2023 NON-REACTIVE    Path Review 01/16/2023     D-Dimer, Quant 01/16/2023 <0.19   Appointment on 12/18/2022  Component Date Value   Area-P 1/2 12/18/2022 3.24    S' Lateral 12/18/2022 3.00    Est EF 12/18/2022 60 - 65%   Appointment on 12/18/2022  Component Date Value   TSH 12/18/2022 1.300    Cholesterol, Total 12/18/2022 158    Triglycerides 12/18/2022 53    HDL 12/18/2022 48    VLDL  Cholesterol Cal 12/18/2022 11    LDL Chol Calc (NIH) 12/18/2022 99    Chol/HDL Ratio 12/18/2022 3.3    Glucose 12/18/2022 88  BUN 12/18/2022 4 (L)    Creatinine, Ser 12/18/2022 0.95    eGFR 12/18/2022 110    BUN/Creatinine Ratio 12/18/2022 4 (L)    Sodium 12/18/2022 144    Potassium 12/18/2022 4.0    Chloride 12/18/2022 103    CO2 12/18/2022 21    Calcium 12/18/2022 9.8    Total Protein 12/18/2022 7.1    Albumin 12/18/2022 4.8    Globulin, Total 12/18/2022 2.3    Albumin/Globulin Ratio 12/18/2022 2.1    Bilirubin Total 12/18/2022 0.3    Alkaline Phosphatase 12/18/2022 55    AST 12/18/2022 14    ALT 12/18/2022 10    WBC 12/18/2022 3.0 (L)    RBC 12/18/2022 4.83    Hemoglobin 12/18/2022 14.1    Hematocrit 12/18/2022 40.5    MCV 12/18/2022 84    MCH 12/18/2022 29.2    MCHC 12/18/2022 34.8    RDW 12/18/2022 13.1    Platelets 12/18/2022 195    Neutrophils 12/18/2022 45    Lymphs 12/18/2022 43    Monocytes 12/18/2022 10    Eos 12/18/2022 1    Basos 12/18/2022 1    Neutrophils Absolute 12/18/2022 1.4    Lymphocytes Absolute 12/18/2022 1.3    Monocytes Absolute 12/18/2022 0.3    EOS (ABSOLUTE) 12/18/2022 0.0    Basophils Absolute 12/18/2022 0.0    Immature Granulocytes 12/18/2022 0    Immature Grans (Abs) 12/18/2022 0.0   Office Visit on 11/24/2022  Component Date Value   Sed Rate 11/24/2022 2    CRP 11/24/2022 6   Admission on 11/22/2022, Discharged on 11/22/2022  Component Date Value   Sodium 11/22/2022 137    Potassium 11/22/2022 4.1    Chloride 11/22/2022 103    CO2 11/22/2022 27    Glucose, Bld 11/22/2022 92    BUN 11/22/2022 5 (L)    Creatinine, Ser 11/22/2022 0.82    Calcium 11/22/2022 9.7    GFR, Estimated 11/22/2022 >60    Anion gap 11/22/2022 7    WBC 11/22/2022 4.1    RBC 11/22/2022 5.14    Hemoglobin 11/22/2022 14.4    HCT 11/22/2022 41.7    MCV 11/22/2022 81.1    MCH 11/22/2022 28.0    MCHC 11/22/2022 34.5    RDW 11/22/2022 12.5    Platelets  11/22/2022 216    nRBC 11/22/2022 0.0    Troponin I (High Sensiti* 11/22/2022 6    Sodium 11/22/2022 140    Potassium 11/22/2022 4.2    Chloride 11/22/2022 100    BUN 11/22/2022 <3 (L)    Creatinine, Ser 11/22/2022 0.80    Glucose, Bld 11/22/2022 89    Calcium, Ion 11/22/2022 1.26    TCO2 11/22/2022 26    Hemoglobin 11/22/2022 14.6    HCT 11/22/2022 43.0    Troponin I (High Sensiti* 11/22/2022 5   There may be more visits with results that are not included.  No image results found. No results found.CT Soft Tissue Neck W Contrast Result Date: 04/12/2023 CLINICAL DATA:  Throat pain for 2 years EXAM: CT NECK WITH CONTRAST TECHNIQUE: Multidetector CT imaging of the neck was performed using the standard protocol following the bolus administration of intravenous contrast. RADIATION DOSE REDUCTION: This exam was performed according to the departmental dose-optimization program which includes automated exposure control, adjustment of the mA and/or kV according to patient size and/or use of iterative reconstruction technique. CONTRAST:  75mL OMNIPAQUE IOHEXOL 300 MG/ML  SOLN COMPARISON:  11/12/2022 FINDINGS: Pharynx and larynx: Normal. No mass or  swelling. Salivary glands: No inflammation, mass, or stone. Thyroid: Normal. Lymph nodes: None enlarged or abnormal density. Vascular: Patent. Incidental note is made that the left vertebral artery originates from the aorta, a normal variant. Limited intracranial: Negative. Visualized orbits: Negative. Mastoids and visualized paranasal sinuses: Clear. Skeleton: No acute fracture or suspicious osseous lesion. Upper chest: Imaged lungs are clear. Other: None. IMPRESSION: Normal CT of the neck. No findings to explain the patient's symptoms. Electronically Signed   By: Wiliam Ke M.D.   On: 04/12/2023 18:14   DG Chest Portable 1 View Result Date: 04/12/2023 CLINICAL DATA:  Cough.  Sore throat. EXAM: PORTABLE CHEST 1 VIEW COMPARISON:  X-ray 03/29/2023 and older  FINDINGS: The heart size and mediastinal contours are within normal limits. Both lungs are clear. Consolidation, pneumothorax or effusion. No edema. The visualized skeletal structures are unremarkable. IMPRESSION: No acute cardiopulmonary disease. Electronically Signed   By: Karen Kays M.D.   On: 04/12/2023 17:15       Assessment & Plan Intractable chronic post-traumatic headache Need analysis of all headache notes run through artificial intelligence, mandadi and underwood try to get comprehensive review of what has been tried. Basically upgrade underwood note and determine if mandadi can help.  Post-Concussive Syndrome with Intractable Headaches They experience severe, disabling headaches radiating from the neck to the head, likely post-concussive. Despite trying multiple treatments, including Aimovig injections, there has been no relief. They are currently on Lyrica but have not yet tried Nurtec or Zavzpret. Under Dr. Angie Fava care, a headache specialist, they are considering Botox to numb muscles and prevent the nerve response causing headaches, after discussing the minor risks of infection and the benefits of muscle relaxation and pain reduction. They prefer injectable treatments over nasal sprays. We will prescribe Nurtec and Ubrelvy, continue Lyrica, and refer them to Dr. Idolina Primer for a Botox evaluation. They will follow up with Dr. Liz Malady at [facility] for a second opinion and continue physical therapy. Flexeril will be prescribed for cervicogenic headaches. Cervicogenic headache Redacted all protected health information from Dr. Idolina Primer neurologist note from 12/20 and will use it as a comprehensive overview of efforts to manage his headache(s). Updated problem overview for this problem to improve longitudinal management  Encouraged patient to continue close follow up with Dr.Underwood, and focusonthat treatment plan Other migraine with status migrainosus, intractable  Tension  headache  Esophageal dysmotility Esophageal Dysmotility They report throat sensations and pain, possibly related to esophageal dysmotility. A pH probe was placed during a recent endoscopy to evaluate for acid reflux, and they are scheduled to return the pH probe soon. We discussed the potential relation of their symptoms to post-concussive syndrome rather than acid reflux. They will return the pH probe for analysis, and we will evaluate for acid reflux based on the pH probe results.     Orders Placed During this Encounter:  No orders of the defined types were placed in this encounter.  Meds ordered this encounter  Medications   Ubrogepant (UBRELVY) 50 MG TABS    Sig: Take 1 tablet (50 mg total) by mouth daily at 6 (six) AM.    Dispense:  18 tablet    Refill:  1   cyclobenzaprine (FLEXERIL) 10 MG tablet    Sig: Take 1 tablet (10 mg total) by mouth 3 (three) times daily as needed for muscle spasms.    Dispense:  30 tablet    Refill:  0    Follow-up They will follow up with Dr. Idolina Primer in 1-2  weeks and with Dr. Corwin Levins on January 21st. They will continue to update Dr. Idolina Primer on medication efficacy and side effects.    This document was synthesized by artificial intelligence (Abridge) using HIPAA-compliant recording of the clinical interaction;   We discussed the use of AI scribe software for clinical note transcription with the patient, who gave verbal consent to proceed.    Additional Info: This encounter employed state-of-the-art, real-time, collaborative documentation. The patient actively reviewed and assisted in updating their electronic medical record on a shared screen, ensuring transparency and facilitating joint problem-solving for the problem list, overview, and plan. This approach promotes accurate, informed care. The treatment plan was discussed and reviewed in detail, including medication safety, potential side effects, and all patient questions. We confirmed  understanding and comfort with the plan. Follow-up instructions were established, including contacting the office for any concerns, returning if symptoms worsen, persist, or new symptoms develop, and precautions for potential emergency department visits.

## 2023-10-18 NOTE — Assessment & Plan Note (Signed)
Need analysis of all headache notes run through artificial intelligence, mandadi and underwood try to get comprehensive review of what has been tried. Basically upgrade underwood note and determine if mandadi can help.  Post-Concussive Syndrome with Intractable Headaches They experience severe, disabling headaches radiating from the neck to the head, likely post-concussive. Despite trying multiple treatments, including Aimovig injections, there has been no relief. They are currently on Lyrica but have not yet tried Nurtec or Zavzpret. Under Dr. Angie Fava care, a headache specialist, they are considering Botox to numb muscles and prevent the nerve response causing headaches, after discussing the minor risks of infection and the benefits of muscle relaxation and pain reduction. They prefer injectable treatments over nasal sprays. We will prescribe Nurtec and Ubrelvy, continue Lyrica, and refer them to Dr. Idolina Primer for a Botox evaluation. They will follow up with Dr. Liz Malady at [facility] for a second opinion and continue physical therapy. Flexeril will be prescribed for cervicogenic headaches.

## 2023-10-18 NOTE — Assessment & Plan Note (Signed)
Esophageal Dysmotility They report throat sensations and pain, possibly related to esophageal dysmotility. A pH probe was placed during a recent endoscopy to evaluate for acid reflux, and they are scheduled to return the pH probe soon. We discussed the potential relation of their symptoms to post-concussive syndrome rather than acid reflux. They will return the pH probe for analysis, and we will evaluate for acid reflux based on the pH probe results.

## 2023-10-19 DIAGNOSIS — Z7689 Persons encountering health services in other specified circumstances: Secondary | ICD-10-CM | POA: Diagnosis not present

## 2023-10-20 NOTE — Assessment & Plan Note (Signed)
Redacted all protected health information from Dr. Idolina Primer neurologist note from 12/20 and will use it as a comprehensive overview of efforts to manage his headache(s). Updated problem overview for this problem to improve longitudinal management  Encouraged patient to continue close follow up with Dr.Underwood, and focusonthat treatment plan

## 2023-10-21 DIAGNOSIS — R1319 Other dysphagia: Secondary | ICD-10-CM | POA: Diagnosis not present

## 2023-10-21 DIAGNOSIS — R09A2 Foreign body sensation, throat: Secondary | ICD-10-CM | POA: Diagnosis not present

## 2023-10-21 DIAGNOSIS — Z7689 Persons encountering health services in other specified circumstances: Secondary | ICD-10-CM | POA: Diagnosis not present

## 2023-10-21 NOTE — Progress Notes (Unsigned)
Skin testing note  RE: Nicholas Johnson. MRN: [id number] DOB: 12-Apr-1991 Date of Office Visit: 10/22/2023  Referring provider: [name], MD Primary care provider: [name], MD  Chief Complaint: skin testing  History of Present Illness: I had the pleasure of seeing Nicholas Johnson for a skin testing visit at the [facility] on 10/22/2023. He is a 32 y.o. male, who is being followed for rhinitis. His previous allergy office visit was on 10/10/2023 with Dr. Selena Johnson. Today is a skin testing visit.   Discussed the use of AI scribe software for clinical note transcription with the patient, who gave verbal consent to proceed.  The patient has been experiencing significant sinus issues, including nasal obstruction. He has been considering consultation with an ENT specialist for further evaluation and potential surgical intervention. The patient has previously tried nasal sprays for symptom management but found them to be ineffective.  Despite his reservations about nasal sprays, the patient agreed to try a combination nasal spray containing Flonase and azelastine.     Assessment and Plan: Nicholas Johnson is a 32 y.o. male with: Other allergic rhinitis Seasonal allergic rhinitis due to pollen Allergic rhinitis due to dust mite Past history - Persistent nasal congestion and sinus blockage for several years, worsening during colds. Previous ENT evaluation noted abnormal mucus. No significant relief with Flonase, Benadryl, or Allegra. Today's skin testing positive to grass, weed, trees, dust mites. Borderline to ragweed.  Start environmental control measures as below. Use over the counter antihistamines such as Zyrtec (cetirizine), Claritin (loratadine), Allegra (fexofenadine), or Xyzal (levocetirizine) daily as needed. May take twice a day during allergy flares. May switch antihistamines every few months. Start Singulair (montelukast) 10mg  daily at night. Cautioned that in some  children/adults can experience behavioral changes including hyperactivity, agitation, depression, sleep disturbances and suicidal ideations. These side effects are rare, but if you notice them you should notify me and discontinue Singulair (montelukast). Start dymista (fluticasone + azelastine nasal spray combination) 1 spray per nostril twice a day. This replaces your other nasal sprays. If it's not covered let us know.  Nasal saline spray (i.e., Simply Saline) or nasal saline lavage (i.e., NeilMed) is recommended as needed and prior to medicated nasal sprays. Consider allergy injections for long term control if above medications do not help the symptoms - handout given.  1 shot. Follow up with ENT.    Return in about 3 months (around 01/20/2024).  Meds ordered this encounter  Medications   montelukast (SINGULAIR) 10 MG tablet    Sig: Take 1 tablet (10 mg total) by mouth at bedtime.    Dispense:  30 tablet    Refill:  3   Azelastine-Fluticasone 137-50 MCG/ACT SUSP    Sig: Place 1 spray into the nose in the morning and at bedtime.    Dispense:  23 g    Refill:  3   Lab Orders  No laboratory test(s) ordered today    Diagnostics: Skin Testing: Environmental allergy panel. Today's skin testing positive to grass, weed, trees, dust mites.  Borderline to ragweed.  Results discussed with patient/family.  Airborne Adult Perc - 10/22/23 1337     Time Antigen Placed 1337    Allergen Manufacturer Waynette Buttery    Location Back    Number of Test 55    Panel 1 Select    1. Control-Buffer 50% Glycerol Negative    2. Control-Histamine 2+    3. Bahia Negative    4. French Southern Territ[place]  Negative    5. Johnson Negative    6. Kentucky Blue Negative    7. Meadow Fescue Negative    8. Perennial Rye Negative    9. Timothy Negative    10. Ragweed Mix Negative    11. Cocklebur Negative    12. Plantain,  English Negative    14. Dog Fennel Negative    15. Guernsey Thistle 2+    16. Lamb's Quarters Negative    17.  Sheep Sorrell Negative    18. Rough Pigweed Negative    19. Marsh Elder, Rough Negative    20. Mugwort, Common Negative    21. Box, Elder Negative    22. Cedar, red Negative    23. Sweet Gum Negative    24. Pecan Pollen 2+    25. Pine Mix Negative    26. Walnut, Black Pollen Negative    27. Red Mulberry Negative    28. Ash Mix Negative    29. Birch Mix Negative    30. Beech American Negative    31. Cottonwood, Guinea[place] Negative    32. Hickory, White 2+    33. Maple Mix Negative    34. Oak, [place] Mix Negative    35. Sycamore Eastern Negative    36. Alternaria Alternata Negative    37. Cladosporium Herbarum Negative    38. Aspergillus Mix Negative    39. Penicillium Mix Negative    40. Bipolaris Sorokiniana (Helminthosporium) Negative    41. Drechslera Spicifera (Curvularia) Negative    42. Mucor Plumbeus Negative    43. Fusarium Moniliforme Negative    44. Aureobasidium Pullulans (pullulara) Negative    45. Rhizopus Oryzae Negative    46. Botrytis Cinera Negative    47. Epicoccum Nigrum Negative    48. Phoma Betae Negative    49. Dust Mite Mix 3+    50. Cat Hair 10,000 BAU/ml Negative    51.  Dog Epithelia Negative    52. Mixed Feathers Negative    53. Horse Epithelia Negative    54. Cockroach, German Negative    55. Tobacco Leaf Negative             Intradermal - 10/22/23 1403     Time Antigen Placed 1403    Allergen Manufacturer Waynette Buttery    Location Arm    Number of Test 14    Intradermal Select    Control Negative    Bahia Negative    French [place] Territ[place] Negative    Johnson --   +/-   7 Grass 2+    Ragweed Mix --   +/-   Weed Mix Negative    Tree Mix Omitted    Mold 1 Negative    Mold 2 Negative    Mold 3 Negative    Mold 4 Negative    Mite Mix Omitted    Cat Negative    Dog Negative    Cockroach Negative    Other Negative    Comments n             Previous notes and tests were reviewed. The plan was reviewed with the patient/family, and all  questions/concerned were addressed.  It was my pleasure to see Nicholas Johnson today and participate in his care. Please feel free to contact me with any questions or concerns.  Sincerely,  Wyline Mood, DO Allergy & Immunology  [facility] [place] office: [phone] [facility] office: [phone]

## 2023-10-22 ENCOUNTER — Other Ambulatory Visit (HOSPITAL_COMMUNITY): Payer: Self-pay

## 2023-10-22 ENCOUNTER — Ambulatory Visit (INDEPENDENT_AMBULATORY_CARE_PROVIDER_SITE_OTHER): Payer: Medicaid Other | Admitting: Allergy

## 2023-10-22 ENCOUNTER — Encounter: Payer: Self-pay | Admitting: Allergy

## 2023-10-22 DIAGNOSIS — M542 Cervicalgia: Secondary | ICD-10-CM | POA: Diagnosis not present

## 2023-10-22 DIAGNOSIS — J301 Allergic rhinitis due to pollen: Secondary | ICD-10-CM

## 2023-10-22 DIAGNOSIS — F0781 Postconcussional syndrome: Secondary | ICD-10-CM | POA: Diagnosis not present

## 2023-10-22 DIAGNOSIS — J3089 Other allergic rhinitis: Secondary | ICD-10-CM | POA: Diagnosis not present

## 2023-10-22 DIAGNOSIS — G4486 Cervicogenic headache: Secondary | ICD-10-CM | POA: Diagnosis not present

## 2023-10-22 DIAGNOSIS — G44321 Chronic post-traumatic headache, intractable: Secondary | ICD-10-CM | POA: Diagnosis not present

## 2023-10-22 MED ORDER — AZELASTINE-FLUTICASONE 137-50 MCG/ACT NA SUSP: 1.0000 | Freq: Two times a day (BID) | NASAL | 3 refills | Status: DC

## 2023-10-22 MED ORDER — MONTELUKAST SODIUM 10 MG PO TABS: 10.0000 mg | ORAL_TABLET | Freq: Every day | ORAL | 3 refills | Status: DC

## 2023-10-22 NOTE — Patient Instructions (Addendum)
Today's skin testing positive to grass, weed, trees, dust mites.  Borderline to ragweed.   Results given.  Environmental allergies Start environmental control measures as below. Use over the counter antihistamines such as Zyrtec (cetirizine), Claritin (loratadine), Allegra (fexofenadine), or Xyzal (levocetirizine) daily as needed. May take twice a day during allergy flares. May switch antihistamines every few months. Start Singulair (montelukast) 10mg  daily at night. Cautioned that in some children/adults can experience behavioral changes including hyperactivity, agitation, depression, sleep disturbances and suicidal ideations. These side effects are rare, but if you notice them you should notify me and discontinue Singulair (montelukast). Start dymista (fluticasone + azelastine nasal spray combination) 1 spray per nostril twice a day. This replaces your other nasal sprays. If it's not covered let us know.  Nasal saline spray (i.e., Simply Saline) or nasal saline lavage (i.e., NeilMed) is recommended as needed and prior to medicated nasal sprays. Consider allergy injections for long term control if above medications do not help the symptoms - handout given.  1 shot. Follow up with ENT.   Return in about 3 months (around 01/20/2024). Or sooner if needed.   Reducing Pollen Exposure Pollen seasons: trees (spring), grass (summer) and ragweed/weeds (fall). Keep windows closed in your home and car to lower pollen exposure.  Install air conditioning in the bedroom and throughout the house if possible.  Avoid going out in dry windy days - especially early morning. Pollen counts are highest between 5 - 10 AM and on dry, hot and windy days.  Save outside activities for late afternoon or after a heavy rain, when pollen levels are lower.  Avoid mowing of grass if you have grass pollen allergy. Be aware that pollen can also be transported indoors on people and pets.  Dry your clothes in an automatic  dryer rather than hanging them outside where they might collect pollen.  Rinse hair and eyes before bedtime.  Control of House Dust Mite Allergen Dust mite allergens are a common trigger of allergy and asthma symptoms. While they can be found throughout the house, these microscopic creatures thrive in warm, humid environments such as bedding, upholstered furniture and carpeting. Because so much time is spent in the bedroom, it is essential to reduce mite levels there.  Encase pillows, mattresses, and box springs in special allergen-proof fabric covers or airtight, zippered plastic covers.  Bedding should be washed weekly in hot water (130 F) and dried in a hot dryer. Allergen-proof covers are available for comforters and pillows that can't be regularly washed.  Wash the allergy-proof covers every few months. Minimize clutter in the bedroom. Keep pets out of the bedroom.  Keep humidity less than 50% by using a dehumidifier or air conditioning. You can buy a humidity measuring device called a hygrometer to monitor this.  If possible, replace carpets with hardwood, linoleum, or washable area rugs. If that's not possible, vacuum frequently with a vacuum that has a HEPA filter. Remove all upholstered furniture and non-washable window drapes from the bedroom. Remove all non-washable stuffed toys from the bedroom.  Wash stuffed toys weekly.

## 2023-10-24 DIAGNOSIS — Z419 Encounter for procedure for purposes other than remedying health state, unspecified: Secondary | ICD-10-CM | POA: Diagnosis not present

## 2023-10-26 ENCOUNTER — Other Ambulatory Visit (HOSPITAL_COMMUNITY): Payer: Self-pay

## 2023-10-28 DIAGNOSIS — Z7689 Persons encountering health services in other specified circumstances: Secondary | ICD-10-CM | POA: Diagnosis not present

## 2023-10-30 ENCOUNTER — Telehealth: Payer: Medicaid Other | Admitting: Internal Medicine

## 2023-10-30 ENCOUNTER — Other Ambulatory Visit (HOSPITAL_COMMUNITY): Payer: Self-pay

## 2023-11-07 DIAGNOSIS — Z7689 Persons encountering health services in other specified circumstances: Secondary | ICD-10-CM | POA: Diagnosis not present

## 2023-11-12 ENCOUNTER — Telehealth: Payer: Self-pay | Admitting: Internal Medicine

## 2023-11-12 NOTE — Telephone Encounter (Signed)
 error

## 2023-11-13 ENCOUNTER — Telehealth: Payer: Self-pay

## 2023-11-13 DIAGNOSIS — G4486 Cervicogenic headache: Secondary | ICD-10-CM | POA: Diagnosis not present

## 2023-11-13 DIAGNOSIS — Z7689 Persons encountering health services in other specified circumstances: Secondary | ICD-10-CM | POA: Diagnosis not present

## 2023-11-13 NOTE — Telephone Encounter (Signed)
Copied from CRM [id number]. Topic: Appointments - Appointment Scheduling >> Nov 13, 2023  9:16 AM Truddie Crumble wrote: Patient/patient representative is calling to schedule an appointment. Refer to attachments for appointment information. Pt would like to schedule a virtual  follow-up appointment from [facility] and discuss a GI appointment. Pt wanted a 4pm appointment due to his work schedule but there was none available  Please see CRM message in regards to patient that is wanting a VV with you. Due to work schedule and no openings your approval would be needed to overbook or make arrangements

## 2023-11-15 ENCOUNTER — Other Ambulatory Visit (HOSPITAL_COMMUNITY): Payer: Self-pay

## 2023-11-15 ENCOUNTER — Telehealth: Payer: Self-pay | Admitting: Pharmacy Technician

## 2023-11-15 NOTE — Telephone Encounter (Signed)
Pharmacy Patient Advocate Encounter   Received notification from CoverMyMeds that prior authorization for Ubrelvy 50MG  tablets is required/requested.   Insurance verification completed.   The patient is insured through [facility] [place] .   Per test claim: PA required; PA submitted to above mentioned insurance via CoverMyMeds Key/confirmation #/EOC [id number] Status is pending

## 2023-11-15 NOTE — Telephone Encounter (Signed)
Pharmacy Patient Advocate Encounter  Received notification from [facility] Medicaid that Prior Authorization for Ubrelvy 50MG  tablets  has been APPROVED from 11/15/2023 to 11/14/2024. Ran test claim, Copay is $4.00. This test claim was processed through [facility]- copay amounts may vary at other pharmacies due to pharmacy/plan contracts, or as the patient moves through the different stages of their insurance plan.   PA #/Case ID/Reference #: [id number]

## 2023-11-16 ENCOUNTER — Encounter: Payer: Self-pay | Admitting: Internal Medicine

## 2023-11-16 DIAGNOSIS — F0781 Postconcussional syndrome: Secondary | ICD-10-CM | POA: Diagnosis not present

## 2023-11-16 DIAGNOSIS — Z7689 Persons encountering health services in other specified circumstances: Secondary | ICD-10-CM | POA: Diagnosis not present

## 2023-11-16 DIAGNOSIS — M542 Cervicalgia: Secondary | ICD-10-CM | POA: Diagnosis not present

## 2023-11-16 DIAGNOSIS — G44329 Chronic post-traumatic headache, not intractable: Secondary | ICD-10-CM | POA: Diagnosis not present

## 2023-11-16 DIAGNOSIS — H539 Unspecified visual disturbance: Secondary | ICD-10-CM | POA: Diagnosis not present

## 2023-11-17 DIAGNOSIS — J029 Acute pharyngitis, unspecified: Secondary | ICD-10-CM | POA: Diagnosis not present

## 2023-11-17 DIAGNOSIS — H6693 Otitis media, unspecified, bilateral: Secondary | ICD-10-CM | POA: Diagnosis not present

## 2023-11-17 DIAGNOSIS — Z20822 Contact with and (suspected) exposure to covid-19: Secondary | ICD-10-CM | POA: Diagnosis not present

## 2023-11-18 DIAGNOSIS — Z7689 Persons encountering health services in other specified circumstances: Secondary | ICD-10-CM | POA: Diagnosis not present

## 2023-11-19 ENCOUNTER — Telehealth (INDEPENDENT_AMBULATORY_CARE_PROVIDER_SITE_OTHER): Payer: Medicaid Other | Admitting: Internal Medicine

## 2023-11-19 ENCOUNTER — Encounter: Payer: Self-pay | Admitting: Internal Medicine

## 2023-11-19 DIAGNOSIS — G44309 Post-traumatic headache, unspecified, not intractable: Secondary | ICD-10-CM | POA: Diagnosis not present

## 2023-11-19 DIAGNOSIS — G8929 Other chronic pain: Secondary | ICD-10-CM | POA: Diagnosis not present

## 2023-11-19 DIAGNOSIS — R112 Nausea with vomiting, unspecified: Secondary | ICD-10-CM | POA: Diagnosis not present

## 2023-11-19 DIAGNOSIS — F0781 Postconcussional syndrome: Secondary | ICD-10-CM

## 2023-11-19 DIAGNOSIS — J328 Other chronic sinusitis: Secondary | ICD-10-CM

## 2023-11-19 DIAGNOSIS — K224 Dyskinesia of esophagus: Secondary | ICD-10-CM

## 2023-11-19 DIAGNOSIS — R519 Headache, unspecified: Secondary | ICD-10-CM | POA: Diagnosis not present

## 2023-11-19 DIAGNOSIS — Z59819 Housing instability, housed unspecified: Secondary | ICD-10-CM

## 2023-11-19 MED ORDER — AMOXICILLIN-POT CLAVULANATE 600-42.9 MG/5ML PO SUSR: 600.0000 mg | Freq: Three times a day (TID) | ORAL | 0 refills | Status: DC

## 2023-11-19 MED ORDER — ONDANSETRON 4 MG PO TBDP: 4.0000 mg | ORAL_TABLET | Freq: Three times a day (TID) | ORAL | 2 refills | Status: DC | PRN

## 2023-11-19 MED ORDER — PREGABALIN 50 MG PO CAPS: 50.0000 mg | ORAL_CAPSULE | Freq: Three times a day (TID) | ORAL | 5 refills | Status: DC
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
## 2023-11-19 NOTE — Assessment & Plan Note (Signed)
Reviewed his ongoing efforts for treatment(s) of chronic headaches/ocular pain saw 2 specialists in past week for this.  Chronic post-concussion syndrome presents with persistent headache, eye pain, and neck pain. Specialists suggest a possible CSF leak, which may not have been detected by MRI. An occipital nerve block provided some relief but was painful. Discussed a spinal tap to check for a CSF leak, neuro-optometrist evaluation for eye pain, increasing Lyrica dose to 100 mg with 3 capsules daily, and referrals to a neuropsychologist and pain specialist for further management, including potential occipital nerve injections.

## 2023-11-20 ENCOUNTER — Telehealth: Payer: Self-pay | Admitting: Internal Medicine

## 2023-11-20 DIAGNOSIS — K224 Dyskinesia of esophagus: Secondary | ICD-10-CM | POA: Diagnosis not present

## 2023-11-20 DIAGNOSIS — Z7189 Other specified counseling: Secondary | ICD-10-CM | POA: Diagnosis not present

## 2023-11-20 DIAGNOSIS — G43009 Migraine without aura, not intractable, without status migrainosus: Secondary | ICD-10-CM | POA: Diagnosis not present

## 2023-11-20 DIAGNOSIS — R09A2 Foreign body sensation, throat: Secondary | ICD-10-CM | POA: Diagnosis not present

## 2023-11-20 DIAGNOSIS — R1319 Other dysphagia: Secondary | ICD-10-CM | POA: Diagnosis not present

## 2023-11-20 DIAGNOSIS — M5481 Occipital neuralgia: Secondary | ICD-10-CM | POA: Diagnosis not present

## 2023-11-20 NOTE — Assessment & Plan Note (Signed)
Esophageal Dysmotility Chronic esophageal dysmotility causes difficulty swallowing. A previous endoflip procedure was ineffective. The GI specialist suggested bethanechol and reflux medication. Discussed potential benefits and risks, and the possibility of liquid formulations. Follow up with GI specialist Dr. Ebony Cargo and consider liquid formulations of prescribed medications if available.

## 2023-11-20 NOTE — Patient Instructions (Addendum)
It was a pleasure seeing you today! Your health and satisfaction are our top priorities.  Glenetta Hew, MD  Your Providers PCP: [name], MD,  [phone]) Referring Provider: [name], MD,  [phone]) Care Team Provider: Curt Bears, MD,  [phone]) Care Team Provider: Barnie Alderman, MD,  [phone]) Care Team Provider: Concepcion Elk, MD,  [phone]) Care Team Provider: Creed Copper, MD,  [phone]) Care Team Provider: London Sheer, MD,  [phone]) Care Team Provider: Nyoka Cowden, MD,  [phone]) Care Team Provider: Case, [place], MD,  [phone]) Care Team Provider: Sonny Dandy, MD,  [phone]) Care Team Provider: Shaune Leeks Care Team Provider: Margo Common, MD,  [phone]) Care Team Provider: Serita Grit, FNP,  [phone]) Care Team Provider: Mar Daring, MD,  [phone]) Care Team Provider: Dennie Bible,  [phone]) Care Team Provider: Francine Graven, [place],  [phone]) Care Team Provider: Jerl Mina, MD,  [phone])     NEXT STEPS: [x]  Early Intervention: Schedule sooner appointment, call our on-call services, or go to emergency room if there is any significant Increase in pain or discomfort New or worsening symptoms Sudden or severe changes in your health [x]  Flexible Follow-Up: We recommend a No follow-ups on file. for optimal routine care. This allows for progress monitoring and treatment adjustments. [x]  Preventive Care: Schedule your annual preventive care visit! It's typically covered by insurance and helps identify potential health issues early. [x]  Lab & X-ray Appointments: Incomplete tests scheduled today, or call to schedule. X-rays: [facility] (M-F, 8:30am-noon or 1pm-5pm). [x]  Medical Information Release: Sign a release form at front desk to obtain relevant medical information we don't  have.  MAKING THE MOST OF OUR FOCUSED 20 MINUTE APPOINTMENTS: [x]   Clearly state your top concerns at the beginning of the visit to focus our discussion [x]   If you anticipate you will need more time, please inform the front desk during scheduling - we can book multiple appointments in the same week. [x]   If you have transportation problems- use our convenient video appointments or ask about transportation support. [x]   We can get down to business faster if you use MyChart to update information before the visit and submit non-urgent questions before your visit. Thank you for taking the time to provide details through MyChart.  Let our nurse know and she can import this information into your encounter documents.  Arrival and Wait Times: [x]   Arriving on time ensures that everyone receives prompt attention. [x]   Early morning (8a) and afternoon (1p) appointments tend to have shortest wait times. [x]   Unfortunately, we cannot delay appointments for late arrivals or hold slots during phone calls.  Getting Answers and Following Up [x]   Simple Questions & Concerns: For quick questions or basic follow-up after your visit, reach [place] at [phone] or MyChart messaging. [x]   Complex Concerns: If your concern is more complex, scheduling an appointment might be best. Discuss this with the staff to find the most suitable option. [x]   Lab & Imaging Results: We'll contact you directly if results are abnormal or you don't use MyChart. Most normal results will be on MyChart within 2-3 business days, with a review message from Dr. Jon Billings. Haven't heard back in 2 weeks? Need results sooner? Contact us at [phone]. [x]   Referrals: Our referral coordinator will manage specialist referrals. The specialist's office should contact you within 2 weeks to schedule an appointment. Call us if you haven't heard from them after  2 weeks.  Staying Connected [x]   MyChart: Activate your MyChart for the fastest way to  access results and message [place]. See the last page of this paperwork for instructions on how to activate.  Bring to Your Next Appointment [x]   Medications: Please bring all your medication bottles to your next appointment to ensure we have an accurate record of your prescriptions. [x]   Health Diaries: If you're monitoring any health conditions at home, keeping a diary of your readings can be very helpful for discussions at your next appointment.  Billing [x]   X-ray & Lab Orders: These are billed by separate companies. Contact the invoicing company directly for questions or concerns. [x]   Visit Charges: Discuss any billing inquiries with our administrative services team.  Your Satisfaction Matters [x]   Share Your Experience: We strive for your satisfaction! If you have any complaints, or preferably compliments, please let Dr. Jon Billings know directly or contact our Practice Administrators, Edwena Felty or [employer], by asking at the front desk.   Reviewing Your Records [x]   Review this early draft of your clinical encounter notes below and the final encounter summary tomorrow on MyChart after its been completed.  All orders placed so far are visible here: Postconcussion syndrome Assessment & Plan: Reviewed his ongoing efforts for treatment(s) of chronic headaches/ocular pain saw 2 specialists in past week for this.   Orders: -     Pregabalin; Take 1 capsule (50 mg total) by mouth 3 (three) times daily.  Dispense: 90 capsule; Refill: 5  Housing insecurity  Nausea and vomiting, unspecified vomiting type -     Ondansetron; Take 1 tablet (4 mg total) by mouth every 8 (eight) hours as needed for nausea or vomiting.  Dispense: 60 tablet; Refill: 2  Other chronic sinusitis -     Amoxicillin-Pot Clavulanate; Take 5 mLs (600 mg total) by mouth in the morning, at noon, and at bedtime.  Dispense: 200 mL; Refill: 0  Chronic intractable pain -     Ambulatory referral to Pain Clinic  Esophageal  dysmotility   VISIT SUMMARY:  You visited today due to ongoing discomfort and pain, including vomiting, headache, and eye strain. We discussed your current health issues, including post-concussion syndrome and esophageal dysmotility, and reviewed your recent experiences with specialists. We have made several adjustments to your treatment plan to help manage your symptoms and improve your quality of life.  YOUR PLAN:  -NAUSEA AND VOMITING: Your nausea and vomiting are likely due to medication intolerance and not eating enough. We have prescribed Zofran to help manage these symptoms. Please take the medication as directed and ensure you have it on hand for your chronic gastrointestinal issues.  -BACTERIAL INFECTION: You may have a bacterial infection, as suggested by your ear pain and recent antibiotic use. We have prescribed liquid amoxicillin (Augmentin) to help with this, as it will be easier for you to swallow.  -POST-CONCUSSION SYNDROME: Post-concussion syndrome is a condition that can cause persistent headaches, eye pain, and neck pain after a concussion. We discussed the possibility of a cerebrospinal fluid (CSF) leak and recommended a spinal tap for further investigation. We also suggested seeing a neuro-optometrist for your eye pain, increasing your Lyrica dose, and following up with a neuropsychologist and pain specialist.  -ESOPHAGEAL DYSMOTILITY: Esophageal dysmotility is a condition that makes swallowing difficult. We discussed trying bethanechol and reflux medication, and the possibility of using liquid formulations of your medications. Please follow up with your GI specialist, Dr. Ebony Cargo, to discuss these options  further.  -GENERAL HEALTH MAINTENANCE: It is important to maintain regular follow-up appointments and have your medications on hand for your chronic conditions. This will help manage your symptoms and improve your overall health.  INSTRUCTIONS:  Please schedule your next  appointment within 1-2 weeks. Additionally, follow up with the pain specialist and neuropsychologist as referred.

## 2023-11-20 NOTE — Telephone Encounter (Signed)
Copied from CRM [id number]. Topic: General - Other >> Nov 19, 2023  4:38 PM Suzette B wrote: Reason for CRM: patient called to advise he was in the waiting room, he states he also sent a message that he was in the waiting room for virtual visit, I was about to advise him of the provider sometimes being 5 to behind however he did state the nurse informed him the provider was running behind but the call then disconnected.  Virtual visit was completed on 11/19/23.

## 2023-11-20 NOTE — Telephone Encounter (Unsigned)
Copied from CRM [id number]. Topic: General - Other >> Nov 20, 2023  9:47 AM Thomes Dinning wrote: Reason for CRM: Patient is requesting a doctors note be sent through my chart to excuse him from work and he would like a call back at [phone] (M)

## 2023-11-20 NOTE — Telephone Encounter (Signed)
Tiffany spoke with patient on 11/19/23 to let him know at 5:05 pm that pcp was ready to do virtual visit. Visit completed at 5:30 pm.

## 2023-11-21 DIAGNOSIS — R519 Headache, unspecified: Secondary | ICD-10-CM | POA: Diagnosis not present

## 2023-11-21 DIAGNOSIS — J3489 Other specified disorders of nose and nasal sinuses: Secondary | ICD-10-CM | POA: Diagnosis not present

## 2023-11-22 NOTE — Telephone Encounter (Signed)
Left vm for patient to call the office back and let me know what day(s) the doctors note is needed for. Also sent a my chart message.

## 2023-11-24 DIAGNOSIS — Z419 Encounter for procedure for purposes other than remedying health state, unspecified: Secondary | ICD-10-CM | POA: Diagnosis not present

## 2023-11-26 DIAGNOSIS — M542 Cervicalgia: Secondary | ICD-10-CM | POA: Diagnosis not present

## 2023-11-26 DIAGNOSIS — S069XAD Unspecified intracranial injury with loss of consciousness status unknown, subsequent encounter: Secondary | ICD-10-CM | POA: Diagnosis not present

## 2023-11-26 DIAGNOSIS — F0781 Postconcussional syndrome: Secondary | ICD-10-CM | POA: Diagnosis not present

## 2023-11-26 DIAGNOSIS — G8929 Other chronic pain: Secondary | ICD-10-CM | POA: Diagnosis not present

## 2023-11-26 DIAGNOSIS — G44321 Chronic post-traumatic headache, intractable: Secondary | ICD-10-CM | POA: Diagnosis not present

## 2023-12-02 DIAGNOSIS — Z7689 Persons encountering health services in other specified circumstances: Secondary | ICD-10-CM | POA: Diagnosis not present

## 2023-12-03 ENCOUNTER — Ambulatory Visit: Payer: Self-pay | Admitting: Internal Medicine

## 2023-12-03 NOTE — Telephone Encounter (Signed)
 Copied from CRM [id number].  Topic: Clinical - Pink Word Triage >> Dec 03, 2023  4:14 PM Nicholas Johnson wrote: Reason for Triage: Pt stated that he has been going through difficulty swallowing causing to vomit at times cause the food/fluid is not able to go down, he has been going through this for about 6 months   Patient did not want to discuss symptoms. Patient calling to see if he can be seen earlier than his appointment on 12/06/23. Patient would like a call back from the office to discuss scheduling options.    Reason for Disposition  Swallowing difficulty is a chronic symptom (recurrent or ongoing AND present > 4 weeks)  Protocols used: Swallowing Difficulty-A-AH

## 2023-12-03 NOTE — Telephone Encounter (Deleted)
 Copied from CRM [id number]. Topic: Clinical - Pink Word Triage >> Dec 03, 2023  4:14 PM Nicholas Johnson wrote: Reason for Triage: Pt stated that he has been going through difficulty swallowing causing to vomit at times cause the food/fluid is not able to go down, he has been going through this for about 6 months

## 2023-12-05 ENCOUNTER — Ambulatory Visit: Payer: Medicaid Other | Admitting: Internal Medicine

## 2023-12-06 ENCOUNTER — Telehealth: Payer: Medicaid Other | Admitting: Internal Medicine

## 2023-12-06 ENCOUNTER — Ambulatory Visit: Payer: Medicaid Other | Admitting: Internal Medicine

## 2023-12-06 ENCOUNTER — Encounter: Payer: Self-pay | Admitting: Internal Medicine

## 2023-12-06 DIAGNOSIS — F0781 Postconcussional syndrome: Secondary | ICD-10-CM | POA: Diagnosis not present

## 2023-12-06 DIAGNOSIS — R0989 Other specified symptoms and signs involving the circulatory and respiratory systems: Secondary | ICD-10-CM | POA: Diagnosis not present

## 2023-12-06 DIAGNOSIS — R1314 Dysphagia, pharyngoesophageal phase: Secondary | ICD-10-CM

## 2023-12-06 DIAGNOSIS — K219 Gastro-esophageal reflux disease without esophagitis: Secondary | ICD-10-CM | POA: Diagnosis not present

## 2023-12-06 MED ORDER — ALUM & MAG HYDROXIDE-SIMETH 400-400-40 MG/5ML PO SUSP
5.0000 mL | Freq: Four times a day (QID) | ORAL | 0 refills | Status: DC | PRN
Start: 2023-12-06 — End: 2024-02-21

## 2023-12-06 NOTE — Assessment & Plan Note (Signed)
Oropharyngeal Dysphagia Progressive dysphagia for liquids and solids has worsened over months, with symptoms of throat tightness, dyspnea, and sensation of fluids getting stuck. Previous speech thera was ineffective. Likely related to post-concussion syndrome and esophageal dysmotility, with differential diagnoses including neurological causes and GERD. Discussed risks of dehydration and aspiration. Order an urgent video swallow study and refer to a speech pathologist for urgent evaluation. Prescribe Maalox every six hours. Advise monitoring hydration and visiting the hospital if unable to stay hydrated or experiencing significa weakness.

## 2023-12-06 NOTE — Assessment & Plan Note (Signed)
Chronic acid reflux may contribute to dysphagia. Rabeprazole is prescribed for ac suppression to prevent further esophageal damage, though not for symptom relief. Continue rabeprazole and consider liquid medications to avoid exacerbating dysphagia. Discuss potential use of Maalox and prednisone for inflammation if tolerate

## 2023-12-06 NOTE — Telephone Encounter (Signed)
Patient saw Dr. Jon Billings for a VV today.
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
## 2023-12-06 NOTE — Patient Instructions (Signed)
It was a pleasure seeing you today! Your health and satisfaction are our top priorities.  Glenetta Hew, MD  Your Providers PCP: [name], MD,  [phone]) Referring Provider: [name], MD,  [phone]) Care Team Provider: Curt Bears, MD,  [phone]) Care Team Provider: Barnie Alderman, MD,  [phone]) Care Team Provider: Concepcion Elk, MD,  [phone]) Care Team Provider: Creed Copper, MD,  [phone]) Care Team Provider: London Sheer, MD,  [phone]) Care Team Provider: Nyoka Cowden, MD,  [phone]) Care Team Provider: Case, [place], MD,  [phone]) Care Team Provider: Sonny Dandy, MD,  [phone]) Care Team Provider: Shaune Leeks Care Team Provider: Margo Common, MD,  [phone]) Care Team Provider: Serita Grit, FNP,  [phone]) Care Team Provider: Mar Daring, MD,  [phone]) Care Team Provider: Dennie Bible,  [phone]) Care Team Provider: Francine Graven, [place],  [phone]) Care Team Provider: Jerl Mina, MD,  [phone])     NEXT STEPS: [x]  Early Intervention: Schedule sooner appointment, call our on-call services, or go to emergency room if there is any significant Increase in pain or discomfort New or worsening symptoms Sudden or severe changes in your health [x]  Flexible Follow-Up: We recommend a No follow-ups on file. for optimal routine care. This allows for progress monitoring and treatment adjustments. [x]  Preventive Care: Schedule your annual preventive care visit! It's typically covered by insurance and helps identify potential health issues early. [x]  Lab & X-ray Appointments: Incomplete tests scheduled today, or call to schedule. X-rays: [facility] (M-F, 8:30am-noon or 1pm-5pm). [x]  Medical Information Release: Sign a release form at front desk to obtain relevant medical information we don't  have.  MAKING THE MOST OF OUR FOCUSED 20 MINUTE APPOINTMENTS: [x]   Clearly state your top concerns at the beginning of the visit to focus our discussion [x]   If you anticipate you will need more time, please inform the front desk during scheduling - we can book multiple appointments in the same week. [x]   If you have transportation problems- use our convenient video appointments or ask about transportation support. [x]   We can get down to business faster if you use MyChart to update information before the visit and submit non-urgent questions before your visit. Thank you for taking the time to provide details through MyChart.  Let our nurse know and she can import this information into your encounter documents.  Arrival and Wait Times: [x]   Arriving on time ensures that everyone receives prompt attention. [x]   Early morning (8a) and afternoon (1p) appointments tend to have shortest wait times. [x]   Unfortunately, we cannot delay appointments for late arrivals or hold slots during phone calls.  Getting Answers and Following Up [x]   Simple Questions & Concerns: For quick questions or basic follow-up after your visit, reach [place] at [phone] or MyChart messaging. [x]   Complex Concerns: If your concern is more complex, scheduling an appointment might be best. Discuss this with the staff to find the most suitable option. [x]   Lab & Imaging Results: We'll contact you directly if results are abnormal or you don't use MyChart. Most normal results will be on MyChart within 2-3 business days, with a review message from Dr. Jon Billings. Haven't heard back in 2 weeks? Need results sooner? Contact us at [phone]. [x]   Referrals: Our referral coordinator will manage specialist referrals. The specialist's office should contact you within 2 weeks to schedule an appointment. Call us if you haven't heard from them after  2 weeks.  Staying Connected [x]   MyChart: Activate your MyChart for the fastest way to  access results and message [place]. See the last page of this paperwork for instructions on how to activate.  Bring to Your Next Appointment [x]   Medications: Please bring all your medication bottles to your next appointment to ensure we have an accurate record of your prescriptions. [x]   Health Diaries: If you're monitoring any health conditions at home, keeping a diary of your readings can be very helpful for discussions at your next appointment.  Billing [x]   X-ray & Lab Orders: These are billed by separate companies. Contact the invoicing company directly for questions or concerns. [x]   Visit Charges: Discuss any billing inquiries with our administrative services team.  Your Satisfaction Matters [x]   Share Your Experience: We strive for your satisfaction! If you have any complaints, or preferably compliments, please let Dr. Jon Billings know directly or contact our Practice Administrators, Edwena Felty or [employer], by asking at the front desk.   Reviewing Your Records [x]   Review this early draft of your clinical encounter notes below and the final encounter summary tomorrow on MyChart after its been completed.  All orders placed so far are visible here: Pharyngoesophageal dysphagia -     SLP peds video swallow; Future -     Alum & Mag Hydroxide-Simeth; Take 5 mLs by mouth every 6 (six) hours as needed for indigestion.  Dispense: 355 mL; Refill: 0  Throat tightness -     SLP peds video swallow; Future -     Alum & Mag Hydroxide-Simeth; Take 5 mLs by mouth every 6 (six) hours as needed for indigestion.  Dispense: 355 mL; Refill: 0  Laryngopharyngeal reflux (LPR)  Postconcussion syndrome

## 2023-12-06 NOTE — Assessment & Plan Note (Signed)
Post-Concussion Syndrome Possible neurological involvement in dysphagia due to post-concussion syndrome, with symptoms including difficulty swallowing and potential nerve coordination issu Discussed potential benefits and risks of Lyrica and emphasized the need for a seco opinion from Dr. Montez Morita. Consider Lyrica and consult Dr. Montez Morita for further evaluation.

## 2023-12-07 ENCOUNTER — Encounter: Payer: Self-pay | Admitting: Physical Medicine and Rehabilitation

## 2023-12-09 DIAGNOSIS — Z7689 Persons encountering health services in other specified circumstances: Secondary | ICD-10-CM | POA: Diagnosis not present

## 2023-12-11 ENCOUNTER — Telehealth: Payer: Self-pay
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
## 2023-12-12 ENCOUNTER — Telehealth (INDEPENDENT_AMBULATORY_CARE_PROVIDER_SITE_OTHER): Payer: Medicaid Other | Admitting: Internal Medicine

## 2023-12-12 ENCOUNTER — Encounter: Payer: Self-pay | Admitting: Internal Medicine

## 2023-12-12 VITALS — Ht 66.0 in | Wt 127.0 lb

## 2023-12-12 DIAGNOSIS — R636 Underweight: Secondary | ICD-10-CM

## 2023-12-12 DIAGNOSIS — F0781 Postconcussional syndrome: Secondary | ICD-10-CM | POA: Diagnosis not present

## 2023-12-12 DIAGNOSIS — S14109D Unspecified injury at unspecified level of cervical spinal cord, subsequent encounter: Secondary | ICD-10-CM | POA: Diagnosis not present

## 2023-12-12 DIAGNOSIS — G44329 Chronic post-traumatic headache, not intractable: Secondary | ICD-10-CM | POA: Diagnosis not present

## 2023-12-12 DIAGNOSIS — E639 Nutritional deficiency, unspecified: Secondary | ICD-10-CM

## 2023-12-12 MED ORDER — CELECOXIB 200 MG PO CAPS: 200.0000 mg | ORAL_CAPSULE | Freq: Two times a day (BID) | ORAL | 3 refills | Status: DC

## 2023-12-12 NOTE — Patient Instructions (Signed)
Please follow up with speech pathology for working on swallowing. Please follow up with neurology for botox consideration Please try increased dose celecoxib 200 mg by mouth twice daily and Lyrica up to 300 mg by mouth twice daily for your pain.  Its ok to break these capsules and put in apple sauce to swallow. Also, consider neuropsychiatry evaluation for traumatic brain injury diagnosis-it could support a disability claim. Let me know if you need me to arrange but these usually aren't covered by insurance.

## 2023-12-12 NOTE — Assessment & Plan Note (Signed)
I encouraged him to maintain body weight and take vitamin supplementation due to poor swallowing function he eats boost and I am recommending him to see swallowing evaluation and putting it in his after visit summary he has 3 different speech pathologist and has been told to work with by the air nose and throat and GI doctors but struggled with follow-up due to social determinants of health and possibly related with traumatic brain injury he tends to struggle with focus but does not have a TBI diagnosis we might consider neuropsych evaluation the patient is currently fixated on ruling out spinal leak which is the plan from neurology

## 2023-12-12 NOTE — Assessment & Plan Note (Signed)
I believe this is likely explaining his ongoing swallowing issues and headache issues which have been exhaustively worked up

## 2023-12-12 NOTE — Assessment & Plan Note (Signed)
>>  ASSESSMENT AND PLAN FOR POST-TRAUMATIC HEADACHE, NOT INTRACTABLE WRITTEN ON 12/12/2023  9:31 AM BY Shivaan Tierno G, MD  I encouraged him to take up to the 200 mg Lyrica  twice a day and Lyrica  increase the Celebrex  also to 200 mg twice a day he has failed many migraine medication treatments so I recommend trying Botox injections and discussing neurology.  I prescribed an increased dose of Celebrex  and okayed him putting it in applesauce so that he can swallow the larger pill sizes because we were not able to get the Celebrex  liquid and I do not think he was able to get the Zavzpret  nasal spray either

## 2023-12-12 NOTE — Assessment & Plan Note (Signed)
I encouraged him to take up to the 200 mg Lyrica twice a day and Lyrica increase the Celebrex also to 200 mg twice a day he has failed many migraine medication treatments so I recommend trying Botox injections and discussing neurology.  I prescribed an increased dose of Celebrex and okayed him putting it in applesauce so that he can swallow the larger pill sizes because we were not able to get the Celebrex liquid and I do not think he was able to get the Zavzpret nasal spray either
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
## 2023-12-13 DIAGNOSIS — G4452 New daily persistent headache (NDPH): Secondary | ICD-10-CM | POA: Diagnosis not present

## 2023-12-16 DIAGNOSIS — Z7689 Persons encountering health services in other specified circumstances: Secondary | ICD-10-CM | POA: Diagnosis not present

## 2023-12-17 DIAGNOSIS — S069XAD Unspecified intracranial injury with loss of consciousness status unknown, subsequent encounter: Secondary | ICD-10-CM | POA: Diagnosis not present

## 2023-12-17 DIAGNOSIS — Z7689 Persons encountering health services in other specified circumstances: Secondary | ICD-10-CM | POA: Diagnosis not present

## 2023-12-21 DIAGNOSIS — F0781 Postconcussional syndrome: Secondary | ICD-10-CM | POA: Diagnosis not present

## 2023-12-21 DIAGNOSIS — M436 Torticollis: Secondary | ICD-10-CM | POA: Diagnosis not present

## 2023-12-22 DIAGNOSIS — Z419 Encounter for procedure for purposes other than remedying health state, unspecified: Secondary | ICD-10-CM | POA: Diagnosis not present

## 2023-12-23 DIAGNOSIS — Z7689 Persons encountering health services in other specified circumstances: Secondary | ICD-10-CM | POA: Diagnosis not present

## 2023-12-26 ENCOUNTER — Ambulatory Visit: Payer: Medicaid Other | Admitting: Internal Medicine

## 2023-12-28 DIAGNOSIS — G44321 Chronic post-traumatic headache, intractable: Secondary | ICD-10-CM | POA: Diagnosis not present

## 2023-12-28 DIAGNOSIS — F0781 Postconcussional syndrome: Secondary | ICD-10-CM | POA: Diagnosis not present

## 2023-12-28 DIAGNOSIS — G4486 Cervicogenic headache: Secondary | ICD-10-CM | POA: Diagnosis not present

## 2023-12-30 DIAGNOSIS — Z7689 Persons encountering health services in other specified circumstances: Secondary | ICD-10-CM | POA: Diagnosis not present

## 2023-12-31 DIAGNOSIS — K219 Gastro-esophageal reflux disease without esophagitis: Secondary | ICD-10-CM | POA: Diagnosis not present

## 2023-12-31 DIAGNOSIS — Z7689 Persons encountering health services in other specified circumstances: Secondary | ICD-10-CM | POA: Diagnosis not present

## 2023-12-31 DIAGNOSIS — R1319 Other dysphagia: Secondary | ICD-10-CM | POA: Diagnosis not present

## 2024-01-01 DIAGNOSIS — Z7689 Persons encountering health services in other specified circumstances: Secondary | ICD-10-CM | POA: Diagnosis not present

## 2024-01-06 ENCOUNTER — Encounter: Payer: Self-pay | Admitting: Internal Medicine

## 2024-01-06 DIAGNOSIS — Z7689 Persons encountering health services in other specified circumstances: Secondary | ICD-10-CM | POA: Diagnosis not present

## 2024-01-07 ENCOUNTER — Telehealth (INDEPENDENT_AMBULATORY_CARE_PROVIDER_SITE_OTHER): Admitting: Internal Medicine

## 2024-01-07 ENCOUNTER — Ambulatory Visit: Admitting: Internal Medicine

## 2024-01-07 ENCOUNTER — Encounter: Payer: Medicaid Other | Admitting: Physical Medicine and Rehabilitation

## 2024-01-07 DIAGNOSIS — G44321 Chronic post-traumatic headache, intractable: Secondary | ICD-10-CM

## 2024-01-07 DIAGNOSIS — K224 Dyskinesia of esophagus: Secondary | ICD-10-CM

## 2024-01-07 DIAGNOSIS — G43811 Other migraine, intractable, with status migrainosus: Secondary | ICD-10-CM

## 2024-01-07 DIAGNOSIS — S069XAS Unspecified intracranial injury with loss of consciousness status unknown, sequela: Secondary | ICD-10-CM | POA: Diagnosis not present

## 2024-01-07 DIAGNOSIS — K219 Gastro-esophageal reflux disease without esophagitis: Secondary | ICD-10-CM | POA: Diagnosis not present

## 2024-01-07 DIAGNOSIS — R519 Headache, unspecified: Secondary | ICD-10-CM

## 2024-01-07 DIAGNOSIS — R4189 Other symptoms and signs involving cognitive functions and awareness: Secondary | ICD-10-CM | POA: Diagnosis not present

## 2024-01-07 DIAGNOSIS — R1319 Other dysphagia: Secondary | ICD-10-CM | POA: Diagnosis not present

## 2024-01-07 DIAGNOSIS — R490 Dysphonia: Secondary | ICD-10-CM | POA: Diagnosis not present

## 2024-01-07 MED ORDER — UBRELVY 50 MG PO TABS: 1.0000 | ORAL_TABLET | Freq: Every day | ORAL | 1 refills | Status: DC

## 2024-01-07 MED ORDER — LIDOCAINE 10 % EX CREA: 1.0000 | TOPICAL_CREAM | Freq: Every day | CUTANEOUS | 11 refills | Status: DC

## 2024-01-07 MED ORDER — UBRELVY 50 MG PO TABS
1.0000 | ORAL_TABLET | Freq: Every day | ORAL | 1 refills | Status: DC
Start: 2024-01-07 — End: 2024-01-07
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
## 2024-01-07 NOTE — Assessment & Plan Note (Signed)
 Severe esophageal dysmotility confirmed by imaging and video swallow study causes significant dysphagia with food and liquids obstructing the esophagus. He manages with soft foods but continues to experience coughing and difficulty swallowing. Current medications, bethanechol and rabeprazole, have not provided noticeable improvement. Surgical intervention is being considered. Coordination with the GI team at [facility] for further evaluation, including a timed barium swallow test, is underway. Continue bethanechol and rabeprazole. Discuss potential surgical options with the GI team, considering POEM procedure and Botox injection as potential interventions.
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
## 2024-01-11 DIAGNOSIS — G44329 Chronic post-traumatic headache, not intractable: Secondary | ICD-10-CM | POA: Diagnosis not present

## 2024-01-11 DIAGNOSIS — F0781 Postconcussional syndrome: Secondary | ICD-10-CM | POA: Diagnosis not present

## 2024-01-11 DIAGNOSIS — M542 Cervicalgia: Secondary | ICD-10-CM | POA: Diagnosis not present

## 2024-01-11 DIAGNOSIS — Z7689 Persons encountering health services in other specified circumstances: Secondary | ICD-10-CM | POA: Diagnosis not present

## 2024-01-14 ENCOUNTER — Ambulatory Visit: Admitting: Internal Medicine

## 2024-01-14 DIAGNOSIS — H04123 Dry eye syndrome of bilateral lacrimal glands: Secondary | ICD-10-CM | POA: Diagnosis not present

## 2024-01-14 DIAGNOSIS — H5319 Other subjective visual disturbances: Secondary | ICD-10-CM | POA: Diagnosis not present

## 2024-01-14 DIAGNOSIS — H53143 Visual discomfort, bilateral: Secondary | ICD-10-CM | POA: Diagnosis not present

## 2024-01-14 DIAGNOSIS — H5713 Ocular pain, bilateral: Secondary | ICD-10-CM | POA: Diagnosis not present

## 2024-01-14 DIAGNOSIS — F0781 Postconcussional syndrome: Secondary | ICD-10-CM | POA: Diagnosis not present

## 2024-01-14 DIAGNOSIS — Z7689 Persons encountering health services in other specified circumstances: Secondary | ICD-10-CM | POA: Diagnosis not present

## 2024-01-16 DIAGNOSIS — G8929 Other chronic pain: Secondary | ICD-10-CM | POA: Diagnosis not present

## 2024-01-16 DIAGNOSIS — S069XAD Unspecified intracranial injury with loss of consciousness status unknown, subsequent encounter: Secondary | ICD-10-CM | POA: Diagnosis not present

## 2024-01-16 DIAGNOSIS — F0781 Postconcussional syndrome: Secondary | ICD-10-CM | POA: Diagnosis not present

## 2024-01-16 DIAGNOSIS — M542 Cervicalgia: Secondary | ICD-10-CM | POA: Diagnosis not present

## 2024-01-16 DIAGNOSIS — X58XXXD Exposure to other specified factors, subsequent encounter: Secondary | ICD-10-CM | POA: Diagnosis not present

## 2024-01-17 DIAGNOSIS — Z7689 Persons encountering health services in other specified circumstances: Secondary | ICD-10-CM | POA: Diagnosis not present

## 2024-01-17 DIAGNOSIS — K219 Gastro-esophageal reflux disease without esophagitis: Secondary | ICD-10-CM | POA: Diagnosis not present

## 2024-01-20 DIAGNOSIS — Z7689 Persons encountering health services in other specified circumstances: Secondary | ICD-10-CM | POA: Diagnosis not present

## 2024-01-22 DIAGNOSIS — S069XAD Unspecified intracranial injury with loss of consciousness status unknown, subsequent encounter: Secondary | ICD-10-CM | POA: Diagnosis not present

## 2024-01-22 DIAGNOSIS — M542 Cervicalgia: Secondary | ICD-10-CM | POA: Diagnosis not present

## 2024-01-22 DIAGNOSIS — F0781 Postconcussional syndrome: Secondary | ICD-10-CM | POA: Diagnosis not present

## 2024-01-22 DIAGNOSIS — G4486 Cervicogenic headache: Secondary | ICD-10-CM | POA: Diagnosis not present

## 2024-01-22 DIAGNOSIS — G44329 Chronic post-traumatic headache, not intractable: Secondary | ICD-10-CM | POA: Diagnosis not present

## 2024-01-22 DIAGNOSIS — X58XXXD Exposure to other specified factors, subsequent encounter: Secondary | ICD-10-CM | POA: Diagnosis not present

## 2024-01-25 DIAGNOSIS — Z7689 Persons encountering health services in other specified circumstances: Secondary | ICD-10-CM | POA: Diagnosis not present

## 2024-01-25 DIAGNOSIS — K3184 Gastroparesis: Secondary | ICD-10-CM | POA: Diagnosis not present

## 2024-01-25 DIAGNOSIS — R1319 Other dysphagia: Secondary | ICD-10-CM | POA: Diagnosis not present

## 2024-01-25 DIAGNOSIS — K219 Gastro-esophageal reflux disease without esophagitis: Secondary | ICD-10-CM | POA: Diagnosis not present

## 2024-01-25 DIAGNOSIS — K5900 Constipation, unspecified: Secondary | ICD-10-CM | POA: Diagnosis not present

## 2024-01-25 DIAGNOSIS — K224 Dyskinesia of esophagus: Secondary | ICD-10-CM | POA: Diagnosis not present

## 2024-01-27 DIAGNOSIS — Z7689 Persons encountering health services in other specified circumstances: Secondary | ICD-10-CM | POA: Diagnosis not present

## 2024-01-28 ENCOUNTER — Other Ambulatory Visit: Payer: Self-pay | Admitting: Internal Medicine

## 2024-01-28 ENCOUNTER — Telehealth (INDEPENDENT_AMBULATORY_CARE_PROVIDER_SITE_OTHER): Admitting: Internal Medicine

## 2024-01-28 DIAGNOSIS — R519 Headache, unspecified: Secondary | ICD-10-CM

## 2024-01-28 DIAGNOSIS — H5713 Ocular pain, bilateral: Secondary | ICD-10-CM

## 2024-01-28 DIAGNOSIS — Z7689 Persons encountering health services in other specified circumstances: Secondary | ICD-10-CM | POA: Diagnosis not present

## 2024-01-28 DIAGNOSIS — K224 Dyskinesia of esophagus: Secondary | ICD-10-CM | POA: Diagnosis not present

## 2024-01-28 MED ORDER — DULOXETINE HCL 20 MG PO CPEP: 20.0000 mg | ORAL_CAPSULE | Freq: Every day | ORAL | 3 refills | Status: DC

## 2024-01-28 MED ORDER — NALTREXONE HCL (PAIN) 4.5 MG PO CAPS: 1.0000 | ORAL_CAPSULE | Freq: Every day | ORAL | 3 refills | Status: DC

## 2024-01-28 MED ORDER — LIDOCAINE 10 % EX CREA: 1.0000 | TOPICAL_CREAM | Freq: Every day | CUTANEOUS | 11 refills | Status: DC
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
## 2024-01-28 NOTE — Patient Instructions (Signed)
 It was a pleasure seeing you today! Your health and satisfaction are our top priorities.  Glenetta Hew, MD  Your Providers PCP: [name], MD,  [phone]) Referring Provider: [name], MD,  [phone]) Care Team Provider: Curt Bears, MD,  [phone]) Care Team Provider: Barnie Alderman, MD,  [phone]) Care Team Provider: Concepcion Elk, MD,  [phone]) Care Team Provider: Creed Copper, MD,  [phone]) Care Team Provider: London Sheer, MD,  [phone]) Care Team Provider: Nyoka Cowden, MD,  [phone]) Care Team Provider: Case, [place], MD,  [phone]) Care Team Provider: Sonny Dandy, MD,  [phone]) Care Team Provider: Shaune Leeks Care Team Provider: Margo Common, MD,  [phone]) Care Team Provider: Serita Grit, FNP,  [phone]) Care Team Provider: Mar Daring, MD,  [phone]) Care Team Provider: Dennie Bible,  [phone]) Care Team Provider: Francine Graven, [place],  [phone]) Care Team Provider: Jerl Mina, MD,  [phone])     NEXT STEPS: [x]  Early Intervention: Schedule sooner appointment, call our on-call services, or go to emergency room if there is any significant Increase in pain or discomfort New or worsening symptoms Sudden or severe changes in your health [x]  Flexible Follow-Up: We recommend a No follow-ups on file. for optimal routine care. This allows for progress monitoring and treatment adjustments. [x]  Preventive Care: Schedule your annual preventive care visit! It's typically covered by insurance and helps identify potential health issues early. [x]  Lab & X-ray Appointments: Incomplete tests scheduled today, or call to schedule. X-rays: [facility] (M-F, 8:30am-noon or 1pm-5pm). [x]  Medical Information Release: Sign a release form at front desk to obtain relevant medical information we don't  have.  MAKING THE MOST OF OUR FOCUSED 20 MINUTE APPOINTMENTS: [x]   Clearly state your top concerns at the beginning of the visit to focus our discussion [x]   If you anticipate you will need more time, please inform the front desk during scheduling - we can book multiple appointments in the same week. [x]   If you have transportation problems- use our convenient video appointments or ask about transportation support. [x]   We can get down to business faster if you use MyChart to update information before the visit and submit non-urgent questions before your visit. Thank you for taking the time to provide details through MyChart.  Let our nurse know and she can import this information into your encounter documents.  Arrival and Wait Times: [x]   Arriving on time ensures that everyone receives prompt attention. [x]   Early morning (8a) and afternoon (1p) appointments tend to have shortest wait times. [x]   Unfortunately, we cannot delay appointments for late arrivals or hold slots during phone calls.  Getting Answers and Following Up [x]   Simple Questions & Concerns: For quick questions or basic follow-up after your visit, reach [place] at [phone] or MyChart messaging. [x]   Complex Concerns: If your concern is more complex, scheduling an appointment might be best. Discuss this with the staff to find the most suitable option. [x]   Lab & Imaging Results: We'll contact you directly if results are abnormal or you don't use MyChart. Most normal results will be on MyChart within 2-3 business days, with a review message from Dr. Jon Billings. Haven't heard back in 2 weeks? Need results sooner? Contact us at [phone]. [x]   Referrals: Our referral coordinator will manage specialist referrals. The specialist's office should contact you within 2 weeks to schedule an appointment. Call us if you haven't heard from them after  2 weeks.  Staying Connected [x]   MyChart: Activate your MyChart for the fastest way to  access results and message [place]. See the last page of this paperwork for instructions on how to activate.  Bring to Your Next Appointment [x]   Medications: Please bring all your medication bottles to your next appointment to ensure we have an accurate record of your prescriptions. [x]   Health Diaries: If you're monitoring any health conditions at home, keeping a diary of your readings can be very helpful for discussions at your next appointment.  Billing [x]   X-ray & Lab Orders: These are billed by separate companies. Contact the invoicing company directly for questions or concerns. [x]   Visit Charges: Discuss any billing inquiries with our administrative services team.  Your Satisfaction Matters [x]   Share Your Experience: We strive for your satisfaction! If you have any complaints, or preferably compliments, please let Dr. Jon Billings know directly or contact our Practice Administrators, Edwena Felty or [employer], by asking at the front desk.   Reviewing Your Records [x]   Review this early draft of your clinical encounter notes below and the final encounter summary tomorrow on MyChart after its been completed.  All orders placed so far are visible here: Esophageal dysmotility  Pain of occiput -     DULoxetine HCl; Take 1 capsule (20 mg total) by mouth daily.  Dispense: 30 capsule; Refill: 3 -     Lidocaine; Apply 1 Application topically daily at 6 (six) AM.  Dispense: 30 g; Refill: 11 -     Naltrexone HCl (Pain); Take 1 tablet by mouth daily at 6 (six) AM.  Dispense: 90 capsule; Refill: 3  Pain of both eyes

## 2024-01-28 NOTE — Assessment & Plan Note (Signed)
 Chronic esophageal dysmotility with associated GERD persists despite bethanechol and rabeprazole. A GI specialist suggests a potential POEM procedure due to inadequate response to medications. Continue bethanechol and rabeprazole. Consider POEM if symptoms persist. Await the GI specialist's letter regarding additional medication options.

## 2024-02-02 DIAGNOSIS — Z419 Encounter for procedure for purposes other than remedying health state, unspecified: Secondary | ICD-10-CM | POA: Diagnosis not present

## 2024-02-11 ENCOUNTER — Encounter: Payer: Self-pay | Admitting: Internal Medicine

## 2024-02-11 ENCOUNTER — Ambulatory Visit: Payer: Self-pay

## 2024-02-11 DIAGNOSIS — Z7689 Persons encountering health services in other specified circumstances: Secondary | ICD-10-CM | POA: Diagnosis not present
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
## 2024-02-12 NOTE — Telephone Encounter (Signed)
 Appt 02/13/2024

## 2024-02-13 ENCOUNTER — Telehealth (INDEPENDENT_AMBULATORY_CARE_PROVIDER_SITE_OTHER): Admitting: Internal Medicine

## 2024-02-13 DIAGNOSIS — G8929 Other chronic pain: Secondary | ICD-10-CM | POA: Diagnosis not present

## 2024-02-13 DIAGNOSIS — H66009 Acute suppurative otitis media without spontaneous rupture of ear drum, unspecified ear: Secondary | ICD-10-CM | POA: Diagnosis not present

## 2024-02-13 DIAGNOSIS — M25551 Pain in right hip: Secondary | ICD-10-CM | POA: Diagnosis not present

## 2024-02-13 DIAGNOSIS — H9201 Otalgia, right ear: Secondary | ICD-10-CM

## 2024-02-13 HISTORY — DX: Otalgia, right ear: H92.01

## 2024-02-13 MED ORDER — DOXYCYCLINE HYCLATE 100 MG PO TABS: 100.0000 mg | ORAL_TABLET | Freq: Two times a day (BID) | ORAL | 0 refills | Status: DC

## 2024-02-13 NOTE — Assessment & Plan Note (Signed)
 Given allergy  handout

## 2024-02-13 NOTE — Patient Instructions (Addendum)
 VISIT SUMMARY:  Today, you were seen for right ear pain and right hip pain. We discussed your symptoms, possible causes, and the next steps for treatment.  YOUR PLAN:  -RIGHT EAR PAIN: Your right ear pain may be due to an ear infection, possibly caused by using earplugs with unclean hands. We will monitor your symptoms and consider further treatment if necessary.  -RIGHT HIP PAIN DUE TO PREVIOUS INJURY: Your chronic right hip pain, which has worsened recently, is likely related to your past injury and surgery. We are referring you to Dr. Hiram Lukes at [facility] for a second opinion and further evaluation. An x-ray of your right hip has been ordered, and we will facilitate the release of your medical records from [facility] to assist with this referral.  INSTRUCTIONS:  Please schedule an appointment with Dr. Hiram Lukes at [facility] for a second opinion on your right hip pain. Additionally, ensure that your medical records from [facility] are released to assist with your referral. We will also schedule a follow-up appointment to discuss your management plan and review your records.  ALLERGY  MANAGEMENT PLAN  This plan is designed to help manage your allergic rhinitis (nasal allergies) effectively. Follow these steps daily for best results.  3x daily sinus saline sprays  Saline mist into nose leaning over sink at 45 degrees Once daily, after a sinus rinse, use sensimist.  Bedtime usually best. Once daily for xyzal 5 mg in am Take benadryl  25 mg at bedtime also if allergic mucus is persisting    DAILY TREATMENT ROUTINE   Time of Day Treatment Steps  Morning 1. Saline Nasal Spray - Use to cleanse nasal passages 2. Xyzal (levocetirizine) - Take one tablet daily   Throughout Day Saline Nasal Spray - Use 2 additional times (mid-day and afternoon)   Evening/Bedtime 1. Saline Nasal Rinse - Thoroughly clean nasal passages 2. Flonase  Sensimist - Apply after nasal rinse 3. Benadryl   (diphenhydramine ) - Take 25mg  if experiencing persistent congestion    PROPER TECHNIQUE GUIDE       Saline Nasal Spray/Rinse Technique: Lean forward over sink at a 45-degree angle Turn head slightly to one side Insert spray tip into upper nostril Spray gently while breathing lightly through your nose Repeat on other side Gently blow nose to clear excess solution Use saline spray 3 times daily to keep nasal passages moist and clear allergens.       Flonase  Sensimist Technique: Shake bottle gently before each use Prime the bottle if it's new or hasn't been used for a week Tilt your head forward slightly Insert tip into nostril, pointing away from the center of your nose Spray while inhaling gently Repeat in other nostril Use Flonase  Sensimist once daily, preferably at bedtime after using saline rinse. It may take several days of regular use to feel maximum benefit.   WHY FLONASE  SENSIMIST?   Benefits of Flonase  Sensimist:  Alcohol-free and scent-free formula - gentler on sensitive nasal passages Fine mist application - more comfortable with less dripping down throat Effectively relieves nasal congestion, sneezing, runny nose, and even eye symptoms 24-hour relief with once-daily dosing Uses a more potent form of fluticasone  that works at a lower dose Less liquid per spray means less discomfort  UNDERSTANDING YOUR MEDICATIONS   Medication How It Works Important Notes  Flonase  Sensimist (fluticasone  furoate) Reduces inflammation in nasal passages, addressing the underlying cause of allergy  symptoms - Takes several days for full effect - Use daily for best results -  Safe for long-term use   Xyzal (levocetirizine) Blocks histamine to reduce allergy  symptoms like sneezing and itching - Take at the same time each day - May cause drowsiness in some people - Once-daily dosing   Benadryl  (diphenhydramine ) Antihistamine that provides additional relief for breakthrough symptoms - Causes  drowsiness - Use only at bedtime - For occasional use when needed   Saline Spray/Rinse Physically removes allergens and moistens nasal passages - Safe to use frequently - Improves effectiveness of other treatments - Reduces nasal irritation    CONTACT YOUR PROVIDER IF: Your symptoms do not improve after 1-2 weeks of following this plan You develop sinus pain with fever or green/yellow discharge You experience frequent nosebleeds You develop new or worsening symptoms You have questions about your treatment plan     ADDITIONAL ALLERGY  MANAGEMENT TIPS   HELPFUL STRATEGIES: ?? Keep windows closed during high pollen seasons ??? Use allergen-proof covers for pillows and mattresses ?? Vacuum regularly with a HEPA filter vacuum ?? Shower and change clothes after spending time outdoors ?? Check local pollen counts and limit outdoor time when counts are high ?? Stay well-hydrated to help keep mucous membranes moist      Hip Pain The hip is the joint between the upper legs and the lower pelvis. The bones, cartilage, tendons, and muscles of your hip joint support your body and allow you to move around. Hip pain can range from a minor ache to severe pain in one or both of your hips. The pain may be felt on the inside of the hip joint near the groin, or on the outside near the buttocks and upper thigh. You may also have swelling or stiffness in your hip area. Follow these instructions at home: Managing pain, stiffness, and swelling     If told, put ice on the painful area. Put ice in a plastic bag. Place a towel between your skin and the bag. Leave the ice on for 20 minutes, 2-3 times a day. If told, apply heat to the affected area as often as told by your health care provider. Use the heat source that your provider recommends, such as a moist heat pack or a heating pad. Place a towel between your skin and the heat source. Leave the heat on for 20-30 minutes. If your skin turns bright  red, remove the ice or heat right away to prevent skin damage. The risk of damage is higher if you cannot feel pain, heat, or cold. Activity Do exercises as told by your provider. Avoid activities that cause pain. General instructions  Take over-the-counter and prescription medicines only as told by your provider. Keep a journal of your symptoms. Write down: How often you have hip pain. The location of your pain. What the pain feels like. What makes the pain worse. Sleep with a pillow between your legs on your most comfortable side. Keep all follow-up visits. Your provider will monitor your pain and activity. Contact a health care provider if: You cannot put weight on your leg. Your pain or swelling gets worse after a week. It gets harder to walk. You have a fever. Get help right away if: You fall. You have a sudden increase in pain and swelling in your hip. Your hip is red or swollen or very tender to touch. This information is not intended to replace advice given to you by your health care provider. Make sure you discuss any questions you have with your health care provider. Document Revised: 06/13/2022 Document  Reviewed: 06/13/2022 Elsevier Patient Education  2024 [employer]   Otitis Media, Adult  Otitis media occurs when there is inflammation and fluid in the middle ear with signs and symptoms of an acute infection. The middle ear is a part of the ear that contains bones for hearing as well as air that helps send sounds to the brain. When infected fluid builds up in this space, it causes pressure and can lead to an ear infection. The eustachian tube connects the middle ear to the back of the nose (nasopharynx) and normally allows air into the middle ear. If the eustachian tube becomes blocked, fluid can build up and become infected. What are the causes? This condition is caused by a blockage in the eustachian tube. This can be caused by mucus or by swelling of the tube.  Problems that can cause a blockage include: A cold or other upper respiratory infection. Allergies. An irritant, such as tobacco smoke. Enlarged adenoids. The adenoids are areas of soft tissue located high in the back of the throat, behind the nose and the roof of the mouth. They are part of the body's defense system (immune system). A mass in the nasopharynx. Damage to the ear caused by pressure changes (barotrauma). What increases the risk? You are more likely to develop this condition if you: Smoke or are exposed to tobacco smoke. Have an opening in the roof of your mouth (cleft palate). Have gastroesophageal reflux. Have an immune system disorder. What are the signs or symptoms? Symptoms of this condition include: Ear pain. Fever. Decreased hearing. Tiredness (lethargy). Fluid leaking from the ear, if the eardrum is ruptured or has burst. Ringing in the ear. How is this diagnosed?  This condition is diagnosed with a physical exam. During the exam, your health care provider will use an instrument called an otoscope to look in your ear and check for redness, swelling, and fluid. He or she will also ask about your symptoms. Your health care provider may also order tests, such as: A pneumatic otoscopy. This is a test to check the movement of the eardrum. It is done by squeezing a small amount of air into the ear. A tympanogram. This is a test that shows how well the eardrum moves in response to air pressure in the ear canal. It provides a graph for your health care provider to review. How is this treated? This condition can go away on its own within 3-5 days. But if the condition is caused by a bacterial infection and does not go away on its own, or if it keeps coming back, your health care provider may: Prescribe antibiotic medicine to treat the infection. Prescribe or recommend medicines to control pain. Follow these instructions at home: Take over-the-counter and prescription  medicines only as told by your health care provider. If you were prescribed an antibiotic medicine, take it as told by your health care provider. Do not stop taking the antibiotic even if you start to feel better. Keep all follow-up visits. This is important. Contact a health care provider if: You have bleeding from your nose. There is a lump on your neck. You are not feeling better in 5 days. You feel worse instead of better. Get help right away if: You have severe pain that is not controlled with medicine. You have swelling, redness, or pain around your ear. You have stiffness in your neck. A part of your face is not moving (paralyzed). The bone behind your ear (mastoid bone) is tender  when you touch it. You develop a severe headache. Summary Otitis media is redness, soreness, and swelling of the middle ear, usually resulting in pain and decreased hearing. This condition can go away on its own within 3-5 days. If the problem does not go away in 3-5 days, your health care provider may give you medicines to treat the infection. If you were prescribed an antibiotic medicine, take it as told by your health care provider. Follow all instructions that were given to you by your health care provider. This information is not intended to replace advice given to you by your health care provider. Make sure you discuss any questions you have with your health care provider. Document Revised: 01/17/2021 Document Reviewed: 01/17/2021 Elsevier Patient Education  2024 [employer].

## 2024-02-13 NOTE — Progress Notes (Signed)
 ====================================  [facility]: [phone]   --  Virtual Video Medical Office Visit --  Patient: Nicholas Johnson.      Age: 33 y.o.       Sex:  male  Date:   02/13/2024 Today's Healthcare Provider: Anthon Kins, MD  ====================================   Chief Complaint/Reason For Visit: Medical Management of Chronic Issues (hip pain, right and left eye pain, right ear pain.)  History of Present Illness  33 year old male who presents with right ear pain and right hip pain.  He experiences right ear pain, which he suspects might be due to an ear infection. The pain began after using earplugs, and he is concerned that unclean hands might have contributed to the issue. No allergies were identified in a recent allergy  test.  He has a history of a right hip injury from a few years ago, which required surgery. He reports persistent pain in the right hip joint, which has recently intensified to the point where he was unable to walk. The pain is located in the hip joint, extending to the bottom part of his leg, the top, and deep within the joint. He has not had a follow-up evaluation since the surgery and is seeking a second opinion from another orthopedic surgeon. His job is cooperative, which helps him manage his pain while working.   Medications reviewed Current Outpatient Medications on File Prior to Visit  Medication Sig   bethanechol (URECHOLINE) 25 MG tablet Take by mouth.   celecoxib  (CELEBREX ) 200 MG capsule Take 1 capsule (200 mg total) by mouth 2 (two) times daily.   DULoxetine  (CYMBALTA ) 20 MG capsule Take 1 capsule (20 mg total) by mouth daily.   pregabalin  (LYRICA ) 50 MG capsule Take 1 capsule (50 mg total) by mouth 3 (three) times daily.   Prucalopride Succinate 2 MG TABS Take 2 mg by mouth.   alum & mag hydroxide-simeth (MAALOX PLUS) 400-400-40 MG/5ML suspension Take 5 mLs by mouth every 6 (six) hours as needed for  indigestion. (Patient not taking: Reported on 02/13/2024)   Azelastine -Fluticasone  137-50 MCG/ACT SUSP Place 1 spray into the nose in the morning and at bedtime. (Patient not taking: Reported on 02/13/2024)   cyclobenzaprine  (FLEXERIL ) 10 MG tablet Take 1 tablet (10 mg total) by mouth 3 (three) times daily as needed for muscle spasms. (Patient not taking: Reported on 02/13/2024)   DULoxetine  (CYMBALTA ) 30 MG capsule Take 1 cap q AM x 1 week then increase to 2 caps qAM thereafter (Patient not taking: Reported on 02/13/2024)   erythromycin  ophthalmic ointment  (Patient not taking: Reported on 02/13/2024)   Lidocaine  10 % CREA Apply 1 Application topically daily at 6 (six) AM. (Patient not taking: Reported on 02/13/2024)   montelukast  (SINGULAIR ) 10 MG tablet Take 1 tablet (10 mg total) by mouth at bedtime. (Patient not taking: Reported on 02/13/2024)   Naltrexone  HCl, Pain, 4.5 MG CAPS Take 1 tablet by mouth daily at 6 (six) AM.   ondansetron  (ZOFRAN -ODT) 4 MG disintegrating tablet Take 1 tablet (4 mg total) by mouth every 8 (eight) hours as needed for nausea or vomiting. (Patient not taking: Reported on 02/13/2024)   RABEprazole (ACIPHEX) 20 MG tablet Take by mouth. (Patient not taking: Reported on 02/13/2024)   Ubrogepant  (UBRELVY ) 50 MG TABS Take 1 tablet (50 mg total) by mouth daily at 6 (six) AM. (Patient not taking: Reported on 02/13/2024)   No current facility-administered medications on file prior to visit.  There are no discontinued medications.      Virtual Physical Exam Physical Exam Exam Context: Evaluation limited by virtual format; however, patient is clearly visualized, cooperative, and engaged throughout. General Appearance: Well-developed, well-nourished; no acute distress by limited video assessment. Pulmonary: No respiratory distress apparent; normal work of breathing observed. Neurological: Patient is awake, alert, and demonstrates no obvious focal neurological deficits or cognitive  impairments; sensorium appears unclouded. Psychiatric/Mental Status: Mood is appropriate; demeanor is pleasant, calm, and articulate. Speech is coherent and goal-directed with no evidence of slurred or pressured speech. No abnormal psychomotor activity noted. Substance Misuse Indicators: Pupils appear symmetric and reactive as far as can be assessed via video. No track marks, skin lesions, or other stigmata of substance misuse visible. No signs of intoxication or withdrawal are evident.  He is able to walk at work on video and the pain is not obvious at the time of appointment  Office Visit on 07/16/2023  Component Date Value   WBC 07/16/2023 5.1    RBC 07/16/2023 5.13    Hemoglobin 07/16/2023 14.4    HCT 07/16/2023 43.9    MCV 07/16/2023 85.7    MCHC 07/16/2023 32.9    RDW 07/16/2023 13.0    Platelets 07/16/2023 181.0    Neutrophils Relative % 07/16/2023 60.6    Lymphocytes Relative 07/16/2023 29.4    Monocytes Relative 07/16/2023 8.1    Eosinophils Relative 07/16/2023 0.7    Basophils Relative 07/16/2023 1.2    Neutro Abs 07/16/2023 3.1    Lymphs Abs 07/16/2023 1.5    Monocytes Absolute 07/16/2023 0.4    Eosinophils Absolute 07/16/2023 0.0    Basophils Absolute 07/16/2023 0.1    Sodium 07/16/2023 143    Potassium 07/16/2023 4.8    Chloride 07/16/2023 102    CO2 07/16/2023 32    Glucose, Bld 07/16/2023 78    BUN 07/16/2023 17    Creatinine, Ser 07/16/2023 0.91    Total Bilirubin 07/16/2023 0.3    Alkaline Phosphatase 07/16/2023 62    AST 07/16/2023 20    ALT 07/16/2023 13    Total Protein 07/16/2023 7.8    Albumin 07/16/2023 4.7    GFR 07/16/2023 112.46    Calcium  07/16/2023 10.1    Folate 07/16/2023 >24.2    Sed Rate 07/16/2023 15    TSH 07/16/2023 2.35    Color, Urine 07/16/2023 YELLOW    APPearance 07/16/2023 CLEAR    Specific Gravity, Urine 07/16/2023 1.025    pH 07/16/2023 6.5    Total Protein, Urine 07/16/2023 NEGATIVE    Urine Glucose 07/16/2023 NEGATIVE     Ketones, ur 07/16/2023 NEGATIVE    Bilirubin Urine 07/16/2023 NEGATIVE    Hgb urine dipstick 07/16/2023 NEGATIVE    Urobilinogen, UA 07/16/2023 0.2    Leukocytes,Ua 07/16/2023 NEGATIVE    Nitrite 07/16/2023 NEGATIVE    WBC, UA 07/16/2023 0-2/hpf    RBC / HPF 07/16/2023 none seen    Mucus, UA 07/16/2023 Presence of (A)    Vitamin B-12 07/16/2023 587   Admission on 04/12/2023, Discharged on 04/12/2023  Component Date Value   WBC 04/12/2023 5.0    RBC 04/12/2023 5.03    Hemoglobin 04/12/2023 14.5    HCT 04/12/2023 40.9    MCV 04/12/2023 81.3    MCH 04/12/2023 28.8    MCHC 04/12/2023 35.5    RDW 04/12/2023 11.9    Platelets 04/12/2023 168    nRBC 04/12/2023 0.0    Neutrophils Relative % 04/12/2023 64    Neutro Abs 04/12/2023 3.2  Lymphocytes Relative 04/12/2023 28    Lymphs Abs 04/12/2023 1.4    Monocytes Relative 04/12/2023 8    Monocytes Absolute 04/12/2023 0.4    Eosinophils Relative 04/12/2023 0    Eosinophils Absolute 04/12/2023 0.0    Basophils Relative 04/12/2023 0    Basophils Absolute 04/12/2023 0.0    Immature Granulocytes 04/12/2023 0    Abs Immature Granulocytes 04/12/2023 0.01    Sodium 04/12/2023 140    Potassium 04/12/2023 3.7    Chloride 04/12/2023 104    CO2 04/12/2023 28    Glucose, Bld 04/12/2023 112 (H)    BUN 04/12/2023 12    Creatinine, Ser 04/12/2023 0.79    Calcium  04/12/2023 9.9    GFR, Estimated 04/12/2023 >60    Anion gap 04/12/2023 8   Office Visit on 03/05/2023  Component Date Value   Vitamin B-12 03/05/2023 533    Folate 03/05/2023 >23.9    Methylmalonic Acid, Quant 03/05/2023 83 (L)    Ferritin 03/05/2023 35.8    WBC 03/05/2023 4.1    RBC 03/05/2023 4.84    Hemoglobin 03/05/2023 14.1    HCT 03/05/2023 41.5    MCV 03/05/2023 85.6    MCHC 03/05/2023 34.0    RDW 03/05/2023 13.3    Platelets 03/05/2023 190.0    Neutrophils Relative % 03/05/2023 55.7    Lymphocytes Relative 03/05/2023 31.6    Monocytes Relative 03/05/2023 10.7     Eosinophils Relative 03/05/2023 1.0    Basophils Relative 03/05/2023 1.0    Neutro Abs 03/05/2023 2.3    Lymphs Abs 03/05/2023 1.3    Monocytes Absolute 03/05/2023 0.4    Eosinophils Absolute 03/05/2023 0.0    Basophils Absolute 03/05/2023 0.0    Sodium 03/05/2023 140    Potassium 03/05/2023 4.5    Chloride 03/05/2023 101    CO2 03/05/2023 30    Glucose, Bld 03/05/2023 74    BUN 03/05/2023 10    Creatinine, Ser 03/05/2023 0.72    GFR 03/05/2023 122.22    Calcium  03/05/2023 9.9    TSH 03/05/2023 0.98    Magnesium  03/05/2023 1.9   No image results found. No results found.CT Soft Tissue Neck W Contrast Result Date: 04/12/2023 CLINICAL DATA:  Throat pain for 2 years EXAM: CT NECK WITH CONTRAST TECHNIQUE: Multidetector CT imaging of the neck was performed using the standard protocol following the bolus administration of intravenous contrast. RADIATION DOSE REDUCTION: This exam was performed according to the departmental dose-optimization program which includes automated exposure control, adjustment of the mA and/or kV according to patient size and/or use of iterative reconstruction technique. CONTRAST:  75mL OMNIPAQUE  IOHEXOL  300 MG/ML  SOLN COMPARISON:  11/12/2022 FINDINGS: Pharynx and larynx: Normal. No mass or swelling. Salivary glands: No inflammation, mass, or stone. Thyroid : Normal. Lymph nodes: None enlarged or abnormal density. Vascular: Patent. Incidental note is made that the left vertebral artery originates from the aorta, a normal variant. Limited intracranial: Negative. Visualized orbits: Negative. Mastoids and visualized paranasal sinuses: Clear. Skeleton: No acute fracture or suspicious osseous lesion. Upper chest: Imaged lungs are clear. Other: None. IMPRESSION: Normal CT of the neck. No findings to explain the patient's symptoms. Electronically Signed   By: Zoila Hines M.D.   On: 04/12/2023 18:14   DG Chest Portable 1 View Result Date: 04/12/2023 CLINICAL DATA:  Cough.  Sore  throat. EXAM: PORTABLE CHEST 1 VIEW COMPARISON:  X-ray 03/29/2023 and older FINDINGS: The heart size and mediastinal contours are within normal limits. Both lungs are clear. Consolidation, pneumothorax or effusion.  No edema. The visualized skeletal structures are unremarkable. IMPRESSION: No acute cardiopulmonary disease. Electronically Signed   By: Adrianna Horde M.D.   On: 04/12/2023 17:15       Assessment & Plan Ear pain, right Given allergy  handout Acute suppurative otitis media without spontaneous rupture of ear drum, recurrence not specified, unspecified laterality Limited exam due to virtual visit but we will go ahead and treat but I did try to explain I think it is more likely to be eustachian tube dysfunction than infection given the recent pollen I gave him an allergy  handout as well Hip pain, chronic, right Chronic severe right hip pain persists post-injury and surgery, affecting mobility. He is seeking a second opinion for further evaluation and management. Refer to orthopedic surgeon Dr. Hiram Lukes at [facility]. Order an x-ray of the right hip. Facilitate the release of medical records from [facility] to assist with the referral and encourage him to expedite this process. Schedule a follow-up appointment to discuss management and review records.        Orders Placed During this Encounter:   Orders Placed This Encounter  Procedures   DG Hip Unilat W OR W/O Pelvis 2-3 Views Right    Standing Status:   Future    Expiration Date:   02/12/2025    Reason for Exam (SYMPTOM  OR DIAGNOSIS REQUIRED):   hip pain    Preferred imaging location?:   [facility]   Ambulatory referral to Orthopedic Surgery    Referral Priority:   Routine    Referral Type:   Surgical    Referral Reason:   Specialty Services Required    Requested Specialty:   Orthopedic Surgery    Number of Visits Requested:   1   Meds ordered this encounter  Medications   doxycycline  (VIBRA -TABS) 100 MG  tablet    Sig: Take 1 tablet (100 mg total) by mouth 2 (two) times daily. Drink 8 oz water after taking the medication each time.    Dispense:  20 tablet    Refill:  0    Treatment plan discussed and reviewed in detail. Explained medication safety and potential side effects.  Answered all patient questions and confirmed understanding and comfort with the plan. Encouraged patient to contact our office if they have any questions or concerns.  Agreed on patient coming for a sooner office visit if symptoms worsen, persist, or new symptoms develop. Discussed precautions in case of needing to visit the Emergency Department.    ----------------------------------------------------- Attestation:  Today's Healthcare Provider Anthon Kins, MD was located at office at [facility] [address] [place] [address].  The patient was located at work. All video encounter participant identities and locations confirmed visually and verbally.Today's Telemedicine visit was conducted via synchronous Video after consent for telemedicine was obtained:  Video connection was never lost    This document was transcribed and resynthesized, in part, by artificial intelligence ([employer]bridge) using HIPAA-compliant recording of the clinical interaction;   We have discussed the our use of AI scribe software for clinical note transcription with the patient, who has given verbal consent to proceed.

## 2024-02-15 DIAGNOSIS — Z7689 Persons encountering health services in other specified circumstances: Secondary | ICD-10-CM | POA: Diagnosis not present

## 2024-02-17 DIAGNOSIS — Z7689 Persons encountering health services in other specified circumstances: Secondary | ICD-10-CM | POA: Diagnosis not present

## 2024-02-18 DIAGNOSIS — Z7689 Persons encountering health services in other specified circumstances: Secondary | ICD-10-CM | POA: Diagnosis not present

## 2024-02-20 DIAGNOSIS — Z7689 Persons encountering health services in other specified circumstances: Secondary | ICD-10-CM | POA: Diagnosis not present

## 2024-02-21 ENCOUNTER — Encounter: Payer: Self-pay | Admitting: Internal Medicine

## 2024-02-21 ENCOUNTER — Telehealth (INDEPENDENT_AMBULATORY_CARE_PROVIDER_SITE_OTHER): Admitting: Internal Medicine

## 2024-02-21 ENCOUNTER — Ambulatory Visit: Admitting: Internal Medicine

## 2024-02-21 DIAGNOSIS — H93293 Other abnormal auditory perceptions, bilateral: Secondary | ICD-10-CM

## 2024-02-21 DIAGNOSIS — G44321 Chronic post-traumatic headache, intractable: Secondary | ICD-10-CM | POA: Diagnosis not present

## 2024-02-21 DIAGNOSIS — K224 Dyskinesia of esophagus: Secondary | ICD-10-CM | POA: Diagnosis not present

## 2024-02-21 DIAGNOSIS — F0781 Postconcussional syndrome: Secondary | ICD-10-CM | POA: Diagnosis not present

## 2024-02-21 DIAGNOSIS — E639 Nutritional deficiency, unspecified: Secondary | ICD-10-CM

## 2024-02-21 DIAGNOSIS — H5713 Ocular pain, bilateral: Secondary | ICD-10-CM

## 2024-02-21 DIAGNOSIS — M24159 Other articular cartilage disorders, unspecified hip: Secondary | ICD-10-CM
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
## 2024-02-22 ENCOUNTER — Encounter: Payer: Self-pay | Admitting: Internal Medicine

## 2024-02-22 ENCOUNTER — Telehealth: Payer: Self-pay | Admitting: Internal Medicine

## 2024-02-22 DIAGNOSIS — Z7689 Persons encountering health services in other specified circumstances: Secondary | ICD-10-CM | POA: Diagnosis not present

## 2024-02-22 NOTE — Assessment & Plan Note (Signed)
 Assessment: Patient presents with persistent dysphagia, gagging, coughing, and throat constriction sensation. ENT evaluation confirms symptoms are likely esophageal in origin rather than ENT pathology. Current management with bethanechol and prucalopride has provided only partial symptom relief. Labs from 07/16/2023 show normal CBC, metabolic panel, and inflammatory markers. CT neck with contrast on 04/12/2023 showed normal anatomy without identifiable structural causes for symptoms. Plan: Continue bethanechol 25mg  daily and prucalopride 2mg  daily for motility support Follow up with GI specialist within 30 days to assess medication efficacy Discuss risks/benefits of endoscopic evaluation if symptoms persist Cautious approach to surgical intervention given high risk of worsening dysmotility Consider speech therapy consultation for swallow training techniques
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
## 2024-02-22 NOTE — Assessment & Plan Note (Signed)
 Assessment: Patient continues to experience persistent headaches as part of post-traumatic syndrome. Current management with celecoxib  and pregabalin  provides partial relief. No focal neurological deficits noted on examination. Plan: Continue celecoxib  200mg  BID Increase pregabalin  from 50mg  TID to 75mg  TID for better pain control Consider gabapentin  as alternative if pregabalin  increase is ineffective Follow up with pain management clinic within 60 days Discuss potential for trigger point injections if pharmacotherapy fails

## 2024-02-22 NOTE — Telephone Encounter (Unsigned)
 Copied from CRM [id number]. Topic: Referral - Question >> Feb 22, 2024  1:17 PM Alyse July wrote: Reason for CRM: Patient would like a call back or a response in MyChart message for an alternative Hip specialist.

## 2024-02-22 NOTE — Patient Instructions (Signed)
 It was a pleasure seeing you today! Your health and satisfaction are our top priorities.  Nicholas Curt, MD  Your Providers PCP: Anthon Kins, MD,  [phone]) Referring Provider: Anthon Kins, MD,  [phone]) Care Team Provider: Elisa Guest, MD,  [phone]) Care Team Provider: Iline Mallory, MD,  [phone]) Care Team Provider: Porfirio Bristol, MD,  [phone]) Care Team Provider: Kathrine Paris, MD,  [phone]) Care Team Provider: Diedra Fowler, MD,  [phone]) Care Team Provider: Diamond Formica, MD,  [phone]) Care Team Provider: Case, [place], MD,  [phone]) Care Team Provider: Marylou Sobers, MD,  [phone]) Care Team Provider: Hunt Magyar Care Team Provider: Crisoforo Dolores, MD,  [phone]) Care Team Provider: Clarance Cristal, FNP,  [phone]) Care Team Provider: Louanna Rouse, MD,  [phone]) Care Team Provider: Tamar Fairly,  [phone]) Care Team Provider: Maybell Spates, [place],  [phone]) Care Team Provider: Melford Spotted, MD,  [phone])     NEXT STEPS: [x]  Early Intervention: Schedule sooner appointment, call our on-call services, or go to emergency room if there is any significant Increase in pain or discomfort New or worsening symptoms Sudden or severe changes in your health [x]  Flexible Follow-Up: We recommend a No follow-ups on file. for optimal routine care. This allows for progress monitoring and treatment adjustments. [x]  Preventive Care: Schedule your annual preventive care visit! It's typically covered by insurance and helps identify potential health issues early. [x]  Lab & X-ray Appointments: Incomplete tests scheduled today, or call to schedule. X-rays: [facility] (M-F, 8:30am-noon or 1pm-5pm). [x]  Medical Information Release: Sign a release form at front desk to obtain relevant medical information we don't  have.  MAKING THE MOST OF OUR FOCUSED 20 MINUTE APPOINTMENTS: [x]   Clearly state your top concerns at the beginning of the visit to focus our discussion [x]   If you anticipate you will need more time, please inform the front desk during scheduling - we can book multiple appointments in the same week. [x]   If you have transportation problems- use our convenient video appointments or ask about transportation support. [x]   We can get down to business faster if you use MyChart to update information before the visit and submit non-urgent questions before your visit. Thank you for taking the time to provide details through MyChart.  Let our nurse know and she can import this information into your encounter documents.  Arrival and Wait Times: [x]   Arriving on time ensures that everyone receives prompt attention. [x]   Early morning (8a) and afternoon (1p) appointments tend to have shortest wait times. [x]   Unfortunately, we cannot delay appointments for late arrivals or hold slots during phone calls.  Getting Answers and Following Up [x]   Simple Questions & Concerns: For quick questions or basic follow-up after your visit, reach us  at [phone] or MyChart messaging. [x]   Complex Concerns: If your concern is more complex, scheduling an appointment might be best. Discuss this with the staff to find the most suitable option. [x]   Lab & Imaging Results: We'll contact you directly if results are abnormal or you don't use MyChart. Most normal results will be on MyChart within 2-3 business days, with a review message from Dr. Boston Byers. Haven't heard back in 2 weeks? Need results sooner? Contact us  at [phone]. [x]   Referrals: Our referral coordinator will manage specialist referrals. The specialist's office should contact you within 2 weeks to schedule an appointment. Call us  if you haven't heard from them after  2 weeks.  Staying Connected [x]   MyChart: Activate your MyChart for the fastest way to  access results and message us . See the last page of this paperwork for instructions on how to activate.  Bring to Your Next Appointment [x]   Medications: Please bring all your medication bottles to your next appointment to ensure we have an accurate record of your prescriptions. [x]   Health Diaries: If you're monitoring any health conditions at home, keeping a diary of your readings can be very helpful for discussions at your next appointment.  Billing [x]   X-ray & Lab Orders: These are billed by separate companies. Contact the invoicing company directly for questions or concerns. [x]   Visit Charges: Discuss any billing inquiries with our administrative services team.  Your Satisfaction Matters [x]   Share Your Experience: We strive for your satisfaction! If you have any complaints, or preferably compliments, please let Dr. Boston Byers know directly or contact our Practice Administrators, Olinda Bertrand or [employer], by asking at the front desk.   Reviewing Your Records [x]   Review this early draft of your clinical encounter notes below and the final encounter summary tomorrow on MyChart after its been completed.  All orders placed so far are visible here: Nutritional deficiency  Postconcussion syndrome  Intractable chronic post-traumatic headache  Eye pain, bilateral  Esophageal dysmotility  Auditory complaints of both ears  Labral tear of hip, degenerative -     Ambulatory referral to Orthopedic Surgery

## 2024-02-22 NOTE — Assessment & Plan Note (Signed)
 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 Previous orthopedic referrals were not received. Plan: Generate new orthopedic referral to Dr. Hiram Lukes at [facility] specifically for hip evaluation Track referral to ensure receipt and processing Continue celecoxib  for pain management in interim Discuss activity modifications to minimize hip strain

## 2024-02-22 NOTE — Assessment & Plan Note (Signed)
 Assessment: Bilateral eye pain assessed by ophthalmology and pain management as part of post-traumatic neuropathic pain syndrome rather than primary ocular pathology. 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 Plan: Continue pregabalin  with increased dosing as noted above Consider occupational therapy referral for visual tracking exercises Explore low-dose naltrexone  efficacy (currently on 4.5mg  daily) Maintain regular ophthalmology follow-up to rule out ocular complications

## 2024-02-22 NOTE — Assessment & Plan Note (Signed)
 Assessment: Patient reports echoing sensations in both ears, likely related to post-traumatic neurological sequelae. No evidence of infection, structural abnormality, or hearing loss documented. Plan: Neurology to evaluate as part of overall neurological assessment Consider audiology referral to establish functional baseline Continue neuropathic pain management as outlined above Educate on sound desensitization techniques

## 2024-02-25 ENCOUNTER — Telehealth: Payer: Self-pay

## 2024-02-25 DIAGNOSIS — Z7689 Persons encountering health services in other specified circumstances: Secondary | ICD-10-CM | POA: Diagnosis not present

## 2024-02-25 NOTE — Telephone Encounter (Signed)
 Spoke with pt about referral pt stated that [facility] reviewed his chart and will not take him. He can not be seen at [facility] due to a bill. Spoke with referral team they will take care of this.   Copied from CRM [id number]. Topic: Referral - Question >> Feb 25, 2024 12:07 PM Juleen Oakland F wrote: Reason for CRM: Patient request a call back from [facility] regarding referral, wants to discuss options before referral is put in since he may have a bill with a few of the orthopedic offices. Please call him at [phone].

## 2024-02-26 DIAGNOSIS — Z7689 Persons encountering health services in other specified circumstances: Secondary | ICD-10-CM | POA: Diagnosis not present

## 2024-02-29 DIAGNOSIS — Z7689 Persons encountering health services in other specified circumstances: Secondary | ICD-10-CM | POA: Diagnosis not present

## 2024-03-02 DIAGNOSIS — Z7689 Persons encountering health services in other specified circumstances: Secondary | ICD-10-CM | POA: Diagnosis not present

## 2024-03-03 DIAGNOSIS — Z419 Encounter for procedure for purposes other than remedying health state, unspecified: Secondary | ICD-10-CM | POA: Diagnosis not present

## 2024-03-03 DIAGNOSIS — Z7689 Persons encountering health services in other specified circumstances: Secondary | ICD-10-CM | POA: Diagnosis not present

## 2024-03-04 ENCOUNTER — Ambulatory Visit: Admitting: Internal Medicine

## 2024-03-04 NOTE — Telephone Encounter (Signed)
  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 - Somatic symptom disorder, persistent, severe - Recently started gabapentin  300mg  in addition to current medications - Current pain regimen includes pregabalin  (Lyrica ) 50mg  TID, celecoxib  (Celebrex ) 200mg  BID, and duloxetine  (Cymbalta ) 20mg  daily - Additional medical history includes eye pain (bilateral), shoulder pain (bilateral), cervical spinal cord injury sequela, and multiple neurological symptoms  Assessment: Acute exacerbation of chronic cervicogenic pain with concerning neurological symptoms (ICD-10: M53.0 - Cervicocranial syndrome, G44.209 - Post-traumatic headache, unspecified, chronic, R42 - Dizziness and giddiness) based on: - Significant escalation in symptom severity - Constellation of symptoms including neck pain, headache, dizziness, and visual changes - History of cervical spine pathology - Presence of post-concussive syndrome - Recent medication change (addition of gabapentin )  Differential considerations include: - Medication-related side effects (particularly from recent addition of gabapentin ) - Worsening cervical spine pathology with possible neural impingement - Vestibular dysfunction related to previous trauma - Exacerbation of post-concussive syndrome - Potential central or vascular neurological processes requiring exclusion  Plan: 1. Urgent in-office evaluation recommended within 24-48 hours 2. Continue current medication regimen without increases pending in-person  assessment 3. Recommend application of ice to neck for 15-20 minutes every 2-3 hours for temporary relief 4. Will consider updated cervical spine imaging based on in-person examination findings 5. Provided specific guidance on emergency warning signs requiring immediate medical attention 6. Will coordinate with rehabilitation medicine regarding physical therapy options after acute symptoms are controlled 7. Comprehensive medication review indicated at next visit given multiple pain medications and recent addition of gabapentin   Patient Education Provided: - Explained the relationship between cervical spine pathology and headache - Provided specific positioning recommendations to minimize strain - Discussed concerning neurological symptoms requiring emergency evaluation - Cautioned against use of additional over-the-counter NSAIDs due to current celecoxib  use  Medical Decision Making: Moderate complexity due to: - Multiple established diagnoses with acute exacerbation - Risk of serious complications including potential neurological compromise - Complex medication regimen with recent changes - Extensive and complicated past medical history - Need to balance pain management with medication side effect risks - Coordination of care across multiple specialties  Current Medical Guidelines Reference: Management follows the American Academy of Neurology guidelines for post-traumatic headache management and the American College of Physicians guidelines for acute pain management, which recommend multimodal therapy including pharmacological and non-pharmacological approaches. Current pain management aligns with CDC guidelines for chronic pain treatment emphasizing careful medication selection, close monitoring, and functional improvement goals.  Please see the MyChart message reply(ies) for my assessment and plan.  This patient gave consent for this Medical Advice Message and is aware that it may  result in a bill to [employer], as well as the possibility of receiving a bill for a co-payment or deductible. They are an established patient, but are not seeking medical advice exclusively about a problem treated during an in person or video visit in the last seven days. I did not recommend an in person or video visit within seven days of my reply.  I spent a total of 15 minutes cumulative time within 7 days through [employer]. Anthon Kins, MD

## 2024-03-05 DIAGNOSIS — Z7689 Persons encountering health services in other specified circumstances: Secondary | ICD-10-CM | POA: Diagnosis not present

## 2024-03-10 DIAGNOSIS — Z7689 Persons encountering health services in other specified circumstances: Secondary | ICD-10-CM | POA: Diagnosis not present

## 2024-03-14 DIAGNOSIS — Z7689 Persons encountering health services in other specified circumstances: Secondary | ICD-10-CM | POA: Diagnosis not present

## 2024-03-26 ENCOUNTER — Telehealth (INDEPENDENT_AMBULATORY_CARE_PROVIDER_SITE_OTHER): Admitting: Internal Medicine

## 2024-03-26 ENCOUNTER — Encounter: Payer: Self-pay | Admitting: Internal Medicine

## 2024-03-26 DIAGNOSIS — F0781 Postconcussional syndrome: Secondary | ICD-10-CM | POA: Diagnosis not present

## 2024-03-26 DIAGNOSIS — K224 Dyskinesia of esophagus: Secondary | ICD-10-CM | POA: Diagnosis not present

## 2024-03-26 DIAGNOSIS — M24159 Other articular cartilage disorders, unspecified hip: Secondary | ICD-10-CM

## 2024-03-26 DIAGNOSIS — H01002 Unspecified blepharitis right lower eyelid: Secondary | ICD-10-CM | POA: Diagnosis not present

## 2024-03-26 MED ORDER — DOXYCYCLINE HYCLATE 100 MG PO TABS: 100.0000 mg | ORAL_TABLET | Freq: Two times a day (BID) | ORAL | 0 refills | Status: DC

## 2024-03-26 MED ORDER — ERYTHROMYCIN 5 MG/GM OP OINT: 1.0000 | TOPICAL_OINTMENT | Freq: Every day | OPHTHALMIC | 0 refills | Status: DC

## 2024-03-26 MED ORDER — OLOPATADINE HCL 0.2 % OP SOLN: 1.0000 [drp] | Freq: Four times a day (QID) | OPHTHALMIC | 11 refills | Status: DC | PRN

## 2024-03-26 NOTE — Assessment & Plan Note (Signed)
 He has persistent symptoms of post-concussion syndrome. Occupational therapy and other interventions are being explored. Muscular neuro involvement is suspected.

## 2024-03-26 NOTE — Assessment & Plan Note (Signed)
 He experiences ongoing esophageal dysmotility. Mitigare prescription was denied. Non-surgical options are being considered due to potential surgical complications.

## 2024-03-26 NOTE — Progress Notes (Signed)
 ====================================  [facility]: [phone]   --  Virtual Video Medical Office Visit --  Patient: Nicholas Johnson.      Age: 33 y.o.       Sex:  male  Date:   03/26/2024 Today's Healthcare Provider: Anthon Kins, MD  ====================================   Chief Complaint/Reason For Visit: Eye Problem (Pt states he believes he has a stye on his eye once again.)  Discussed the use of AI scribe software for clinical note transcription with the patient, who gave verbal consent to proceed.  History of Present Illness Trig Mcbryar. is a 33 year old male who presents with severe blepharitis of the right eye.  He has severe blepharitis of the right eye, which began as a sty on the lower eyelid and progressed. He applied gel around the eye earlier today, which may be mistaken for discharge. He experiences pain in the affected eye, which was severe yesterday but has lessened today.  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 He is seeing specialists for these issues and has been referred to occupational therapy to explore exercise as a potential treatment.  He is dealing with esophageal dysmotility and is awaiting approval for a medication called Mitigare. He has been referred to a multi-specialty team for further evaluation, but the specialist has been unavailable.  He has a history of hip and leg injuries and has received a steroid injection in his hip. He is scheduled for an MRI of his lumbar spine to investigate potential nerve compression, as he has not had a lumbar spine MRI since his accident.  No current medications are taken, and he is not allergic to any medications, although he has low-level allergies to peanuts, corn, malts, and soybean. He recently moved into a new apartment.   Medications reviewed Current Outpatient Medications on File  Prior to Visit  Medication Sig   bethanechol (URECHOLINE) 25 MG tablet Take by mouth.   celecoxib  (CELEBREX ) 200 MG capsule Take 1 capsule (200 mg total) by mouth 2 (two) times daily.   DULoxetine  (CYMBALTA ) 20 MG capsule Take 1 capsule (20 mg total) by mouth daily.   Naltrexone  HCl, Pain, 4.5 MG CAPS Take 1 tablet by mouth daily at 6 (six) AM.   pregabalin  (LYRICA ) 50 MG capsule Take 1 capsule (50 mg total) by mouth 3 (three) times daily.   Prucalopride Succinate 2 MG TABS Take 2 mg by mouth.   No current facility-administered medications on file prior to visit.  There are no discontinued medications.      Virtual Physical Exam Physical Exam HEENT: Bacterial discharge and swelling on lower right eyelid. Erythematous and swollen lower right eyelid.  General Appearance:  Well Developed, Well Nourished, No Acute Distress by Limited Video Assessment Pulmonary:  No Respiratory Distress Apparent. Normal Work of Breathing.   Neurological:  Awake, Alert. No Obvious Focal Neurological Deficits or Cognitive Impairments.  Sensorium Seems Unclouded. Psychiatric:  Appropriate Mood, Pleasant Demeanor, Calm, Articulate, Good Mood        No results found for any visits on 03/26/24. Office Visit on 07/16/2023  Component Date Value   WBC 07/16/2023 5.1    RBC 07/16/2023 5.13    Hemoglobin 07/16/2023 14.4    HCT 07/16/2023 43.9    MCV 07/16/2023 85.7    MCHC 07/16/2023 32.9    RDW 07/16/2023 13.0    Platelets 07/16/2023 181.0  Neutrophils Relative % 07/16/2023 60.6    Lymphocytes Relative 07/16/2023 29.4    Monocytes Relative 07/16/2023 8.1    Eosinophils Relative 07/16/2023 0.7    Basophils Relative 07/16/2023 1.2    Neutro Abs 07/16/2023 3.1    Lymphs Abs 07/16/2023 1.5    Monocytes Absolute 07/16/2023 0.4    Eosinophils Absolute 07/16/2023 0.0    Basophils Absolute 07/16/2023 0.1    Sodium 07/16/2023 143    Potassium 07/16/2023 4.8    Chloride 07/16/2023 102    CO2 07/16/2023  32    Glucose, Bld 07/16/2023 78    BUN 07/16/2023 17    Creatinine, Ser 07/16/2023 0.91    Total Bilirubin 07/16/2023 0.3    Alkaline Phosphatase 07/16/2023 62    AST 07/16/2023 20    ALT 07/16/2023 13    Total Protein 07/16/2023 7.8    Albumin 07/16/2023 4.7    GFR 07/16/2023 112.46    Calcium  07/16/2023 10.1    Folate 07/16/2023 >24.2    Sed Rate 07/16/2023 15    TSH 07/16/2023 2.35    Color, Urine 07/16/2023 YELLOW    APPearance 07/16/2023 CLEAR    Specific Gravity, Urine 07/16/2023 1.025    pH 07/16/2023 6.5    Total Protein, Urine 07/16/2023 NEGATIVE    Urine Glucose 07/16/2023 NEGATIVE    Ketones, ur 07/16/2023 NEGATIVE    Bilirubin Urine 07/16/2023 NEGATIVE    Hgb urine dipstick 07/16/2023 NEGATIVE    Urobilinogen, UA 07/16/2023 0.2    Leukocytes,Ua 07/16/2023 NEGATIVE    Nitrite 07/16/2023 NEGATIVE    WBC, UA 07/16/2023 0-2/hpf    RBC / HPF 07/16/2023 none seen    Mucus, UA 07/16/2023 Presence of (A)    Vitamin B-12 07/16/2023 587   Admission on 04/12/2023, Discharged on 04/12/2023  Component Date Value   WBC 04/12/2023 5.0    RBC 04/12/2023 5.03    Hemoglobin 04/12/2023 14.5    HCT 04/12/2023 40.9    MCV 04/12/2023 81.3    MCH 04/12/2023 28.8    MCHC 04/12/2023 35.5    RDW 04/12/2023 11.9    Platelets 04/12/2023 168    nRBC 04/12/2023 0.0    Neutrophils Relative % 04/12/2023 64    Neutro Abs 04/12/2023 3.2    Lymphocytes Relative 04/12/2023 28    Lymphs Abs 04/12/2023 1.4    Monocytes Relative 04/12/2023 8    Monocytes Absolute 04/12/2023 0.4    Eosinophils Relative 04/12/2023 0    Eosinophils Absolute 04/12/2023 0.0    Basophils Relative 04/12/2023 0    Basophils Absolute 04/12/2023 0.0    Immature Granulocytes 04/12/2023 0    Abs Immature Granulocytes 04/12/2023 0.01    Sodium 04/12/2023 140    Potassium 04/12/2023 3.7    Chloride 04/12/2023 104    CO2 04/12/2023 28    Glucose, Bld 04/12/2023 112 (H)    BUN 04/12/2023 12    Creatinine, Ser  04/12/2023 0.79    Calcium  04/12/2023 9.9    GFR, Estimated 04/12/2023 >60    Anion gap 04/12/2023 8   No image results found. No results found.CT Soft Tissue Neck W Contrast Result Date: 04/12/2023 CLINICAL DATA:  Throat pain for 2 years EXAM: CT NECK WITH CONTRAST TECHNIQUE: Multidetector CT imaging of the neck was performed using the standard protocol following the bolus administration of intravenous contrast. RADIATION DOSE REDUCTION: This exam was performed according to the departmental dose-optimization program which includes automated exposure control, adjustment of the mA and/or kV according to patient size and/or  use of iterative reconstruction technique. CONTRAST:  75mL OMNIPAQUE  IOHEXOL  300 MG/ML  SOLN COMPARISON:  11/12/2022 FINDINGS: Pharynx and larynx: Normal. No mass or swelling. Salivary glands: No inflammation, mass, or stone. Thyroid : Normal. Lymph nodes: None enlarged or abnormal density. Vascular: Patent. Incidental note is made that the left vertebral artery originates from the aorta, a normal variant. Limited intracranial: Negative. Visualized orbits: Negative. Mastoids and visualized paranasal sinuses: Clear. Skeleton: No acute fracture or suspicious osseous lesion. Upper chest: Imaged lungs are clear. Other: None. IMPRESSION: Normal CT of the neck. No findings to explain the patient's symptoms. Electronically Signed   By: Zoila Hines M.D.   On: 04/12/2023 18:14   DG Chest Portable 1 View Result Date: 04/12/2023 CLINICAL DATA:  Cough.  Sore throat. EXAM: PORTABLE CHEST 1 VIEW COMPARISON:  X-ray 03/29/2023 and older FINDINGS: The heart size and mediastinal contours are within normal limits. Both lungs are clear. Consolidation, pneumothorax or effusion. No edema. The visualized skeletal structures are unremarkable. IMPRESSION: No acute cardiopulmonary disease. Electronically Signed   By: Adrianna Horde M.D.   On: 04/12/2023 17:15       Assessment & Plan Blepharitis of right lower  eyelid, unspecified type Severe blepharitis in the right eye presents with erythema, swelling, and bacterial discharge. Pain has decreased since yesterday. Recommend warm compresses and eyelid hygiene. Prescribe azithromycin eye drops, antihistamine eye drops, and antibiotic ointment. Oral antibiotics are a backup if symptoms persist. Advise warm compresses 2-4 times daily and cleaning eyelid margins with lid scrub or preservative-free baby shampoo. Recommend preservative-free artificial tears. Avoid oral antihistamines due to potential corn derivative content. Esophageal dysmotility He experiences ongoing esophageal dysmotility. Mitigare prescription was denied. Non-surgical options are being considered due to potential surgical complications. Postconcussion syndrome He has persistent symptoms of post-concussion syndrome. Occupational therapy and other interventions are being explored. Muscular neuro involvement is suspected. Labral tear of hip, degenerative He reports hip and leg pain. A previous steroid injection provided temporary relief. An MRI of the lumbar spine is scheduled to assess for nerve compression. Surgery is not recommended due to potential for repeat issues and lack of significant relief. He will continue follow-up with specialists for multiple conditions. Schedule a follow-up appointment with his internal medicine provider.       Orders Placed During this Encounter:  No orders of the defined types were placed in this encounter.  Meds ordered this encounter  Medications   doxycycline  (VIBRA -TABS) 100 MG tablet    Sig: Take 1 tablet (100 mg total) by mouth 2 (two) times daily.    Dispense:  10 tablet    Refill:  0   erythromycin  ophthalmic ointment    Sig: Place 1 Application into the right eye at bedtime.    Dispense:  3.5 g    Refill:  0   Olopatadine HCl 0.2 % SOLN    Sig: Apply 1 drop to eye 4 (four) times daily as needed.    Dispense:  2.5 mL    Refill:  11     Treatment plan discussed and reviewed in detail. Explained medication safety and potential side effects.  Answered all patient questions and confirmed understanding and comfort with the plan. Encouraged patient to contact our office if they have any questions or concerns.  Agreed on patient coming for a sooner office visit if symptoms worsen, persist, or new symptoms develop. Discussed precautions in case of needing to visit the Emergency Department.    ----------------------------------------------------- Attestation:  Today's Healthcare Provider Verdie Gladden  Windy Hatchet, MD was located at office at [facility] [address], [place] [address].  The patient was located at home. All video encounter participant identities and locations confirmed visually and verbally.Today's Telemedicine visit was conducted via synchronous Video after consent for telemedicine was obtained:  Video connection was never lost    This document was transcribed and resynthesized, in part, by artificial intelligence ([employer]) using HIPAA-compliant recording of the clinical interaction;   We have discussed the our use of AI scribe software for clinical note transcription with the patient, who has given verbal consent to proceed.

## 2024-03-26 NOTE — Assessment & Plan Note (Signed)
 He reports hip and leg pain. A previous steroid injection provided temporary relief. An MRI of the lumbar spine is scheduled to assess for nerve compression. Surgery is not recommended due to potential for repeat issues and lack of significant relief.

## 2024-03-27 DIAGNOSIS — Z7689 Persons encountering health services in other specified circumstances: Secondary | ICD-10-CM | POA: Diagnosis not present

## 2024-04-03 ENCOUNTER — Telehealth: Payer: Self-pay

## 2024-04-03 DIAGNOSIS — Z419 Encounter for procedure for purposes other than remedying health state, unspecified: Secondary | ICD-10-CM | POA: Diagnosis not present

## 2024-04-03 NOTE — Telephone Encounter (Signed)
 Copied from CRM [id number]. Topic: General - Other >> Apr 03, 2024 11:33 AM Annelle Kiel wrote: Reason for CRM: patient is calling in regarding a his appt he needs rescheduled for Wednesday are Friday anytime he wont be able to do Tuesday  Notified front office christie she will call pt and have him rescheduled.

## 2024-04-08 ENCOUNTER — Ambulatory Visit: Admitting: Internal Medicine

## 2024-04-08 DIAGNOSIS — Z7689 Persons encountering health services in other specified circumstances: Secondary | ICD-10-CM | POA: Diagnosis not present

## 2024-04-10 ENCOUNTER — Encounter: Payer: Self-pay | Admitting: Internal Medicine

## 2024-04-10 DIAGNOSIS — Z7689 Persons encountering health services in other specified circumstances: Secondary | ICD-10-CM | POA: Diagnosis not present

## 2024-04-14 DIAGNOSIS — Z7689 Persons encountering health services in other specified circumstances: Secondary | ICD-10-CM | POA: Diagnosis not present

## 2024-04-16 ENCOUNTER — Ambulatory Visit: Admitting: Internal Medicine

## 2024-04-18 DIAGNOSIS — Z7689 Persons encountering health services in other specified circumstances: Secondary | ICD-10-CM | POA: Diagnosis not present

## 2024-04-20 DIAGNOSIS — Z7689 Persons encountering health services in other specified circumstances: Secondary | ICD-10-CM | POA: Diagnosis not present

## 2024-04-23 DIAGNOSIS — Z7689 Persons encountering health services in other specified circumstances: Secondary | ICD-10-CM | POA: Diagnosis not present

## 2024-04-24 DIAGNOSIS — M7918 Myalgia, other site: Secondary | ICD-10-CM | POA: Insufficient documentation

## 2024-04-27 DIAGNOSIS — Z7689 Persons encountering health services in other specified circumstances: Secondary | ICD-10-CM | POA: Diagnosis not present

## 2024-04-30 ENCOUNTER — Ambulatory Visit: Admitting: Internal Medicine

## 2024-05-01 ENCOUNTER — Encounter: Payer: Self-pay | Admitting: Internal Medicine

## 2024-05-01 ENCOUNTER — Ambulatory Visit: Admitting: Internal Medicine

## 2024-05-01 ENCOUNTER — Telehealth (INDEPENDENT_AMBULATORY_CARE_PROVIDER_SITE_OTHER): Admitting: Internal Medicine

## 2024-05-01 DIAGNOSIS — R1314 Dysphagia, pharyngoesophageal phase: Secondary | ICD-10-CM | POA: Diagnosis not present

## 2024-05-01 DIAGNOSIS — R636 Underweight: Secondary | ICD-10-CM

## 2024-05-01 DIAGNOSIS — M7918 Myalgia, other site: Secondary | ICD-10-CM | POA: Diagnosis not present

## 2024-05-01 DIAGNOSIS — G44321 Chronic post-traumatic headache, intractable: Secondary | ICD-10-CM

## 2024-05-01 DIAGNOSIS — Z7189 Other specified counseling: Secondary | ICD-10-CM

## 2024-05-01 DIAGNOSIS — S069XAS Unspecified intracranial injury with loss of consciousness status unknown, sequela: Secondary | ICD-10-CM

## 2024-05-01 DIAGNOSIS — G4486 Cervicogenic headache: Secondary | ICD-10-CM

## 2024-05-01 DIAGNOSIS — R5381 Other malaise: Secondary | ICD-10-CM

## 2024-05-01 DIAGNOSIS — Z9189 Other specified personal risk factors, not elsewhere classified: Secondary | ICD-10-CM

## 2024-05-01 DIAGNOSIS — R490 Dysphonia: Secondary | ICD-10-CM

## 2024-05-01 DIAGNOSIS — Z7689 Persons encountering health services in other specified circumstances: Secondary | ICD-10-CM | POA: Diagnosis not present

## 2024-05-01 MED ORDER — LIDOCAINE 5 % EX OINT: 1.0000 | TOPICAL_OINTMENT | CUTANEOUS | 0 refills | Status: AC | PRN

## 2024-05-01 MED ORDER — HYDROCODONE-ACETAMINOPHEN 5-325 MG PO TABS: 1.0000 | ORAL_TABLET | Freq: Every day | ORAL | 0 refills | Status: DC | PRN

## 2024-05-01 NOTE — Patient Instructions (Signed)
 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 We reviewed your current treatments and explored new options to better manage your symptoms.  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 We will explore the possibility of a cerebrospinal fluid (CSF) leak with a CT myelogram and consider retrying nerve blocks with lidocaine . We also discussed the potential use of Botox injections for pain management and referred you to a different neuropsychologist for further cognitive evaluation. You will be prescribed lidocaine  ointment for topical application and hydrocodone  for pain management during procedures.  -ESOPHAGEAL DYSMOTILITY: Esophageal dysmotility means that the muscles in your esophagus do not work properly, making it difficult to swallow. You have a GI appointment scheduled to address these symptoms. In the meantime, continue using nutritional drinks to supplement your dietary intake.  -NUTRITIONAL STATUS CONCERN: Due to your difficulty swallowing, we are concerned about your nutritional status. We will order blood work to assess for any nutritional deficiencies, including vitamin D  levels.  INSTRUCTIONS:  Please follow up with the GI specialist as scheduled for your swallowing difficulties. We will also arrange a CT myelogram to investigate a possible CSF leak. Additionally, you will be referred to a different neuropsychologist for further cognitive evaluation. Continue using nutritional drinks to supplement your diet and apply the prescribed lidocaine  ointment as directed. Use hydrocodone  for pain management during procedures as needed.  It was a pleasure seeing you today! Your health and satisfaction are our top priorities.   Bernardino Cone, MD  Your Providers PCP: Cone Bernardino MATSU, MD,  [phone]) Referring Provider: Cone Bernardino MATSU, MD,  [phone]) Care Team Provider: Charlyne Helling, MD,  [phone]) Care Team Provider: Brien Garnette Pepper, MD,  [phone]) Care Team Provider: Jefrey Bruckner, MD,  [phone]) Care Team Provider: Leland Rankin Argyle, MD,  [phone]) Care Team Provider: Georgina Ozell LABOR, MD,  [phone]) Care Team Provider: Darlean Ozell NOVAK, MD,  [phone]) Care Team Provider: Case, [place], MD,  [phone]) Care Team Provider: [name], MD,  [phone]) Care Team Provider: Delene Thersia PARAS Care Team Provider: [name], MD,  [phone]) Care Team Provider: [name], FNP,  [phone]) Care Team Provider: [name], MD,  [phone]) Care Team Provider: [name],  [phone]) Care Team Provider: [name], [place],  [phone]) Care Team Provider: [name], MD,  [phone])     NEXT STEPS: [x]  Early Intervention: Schedule sooner appointment, call our on-call services, or go to emergency room if there is any significant Increase in pain or discomfort New or worsening symptoms Sudden or severe changes in your health [x]  Flexible Follow-Up: We recommend a No follow-ups on file. for optimal routine care. This allows for progress monitoring and treatment adjustments. [x]  Preventive Care: Schedule your annual preventive care visit! It's typically covered by insurance and helps identify potential health issues early. [x]  Lab & X-ray Appointments: Incomplete tests scheduled today, or call to schedule. X-rays: [facility] (M-F, 8:30am-noon or 1pm-5pm). [x]  Medical Information Release: Sign a release form at front desk to obtain relevant medical information we don't have.  MAKING THE MOST OF OUR FOCUSED 20 MINUTE APPOINTMENTS: [x]   Clearly state your  top concerns at the beginning of the visit to focus our discussion [x]   If you anticipate you will need more  time, please inform the front desk during scheduling - we can book multiple appointments in the same week. [x]   If you have transportation problems- use our convenient video appointments or ask about transportation support. [x]   We can get down to business faster if you use MyChart to update information before the visit and submit non-urgent questions before your visit. Thank you for taking the time to provide details through MyChart.  Let our nurse know and she can import this information into your encounter documents.  Arrival and Wait Times: [x]   Arriving on time ensures that everyone receives prompt attention. [x]   Early morning (8a) and afternoon (1p) appointments tend to have shortest wait times. [x]   Unfortunately, we cannot delay appointments for late arrivals or hold slots during phone calls.  Getting Answers and Following Up [x]   Simple Questions & Concerns: For quick questions or basic follow-up after your visit, reach us  at [phone] or MyChart messaging. [x]   Complex Concerns: If your concern is more complex, scheduling an appointment might be best. Discuss this with the staff to find the most suitable option. [x]   Lab & Imaging Results: We'll contact you directly if results are abnormal or you don't use MyChart. Most normal results will be on MyChart within 2-3 business days, with a review message from Dr. Jesus. Haven't heard back in 2 weeks? Need results sooner? Contact us  at [phone]. [x]   Referrals: Our referral coordinator will manage specialist referrals. The specialist's office should contact you within 2 weeks to schedule an appointment. Call us  if you haven't heard from them after 2 weeks.  Staying Connected [x]   MyChart: Activate your MyChart for the fastest way to access results and message us . See the last page of this paperwork for instructions on how  to activate.  Bring to Your Next Appointment [x]   Medications: Please bring all your medication bottles to your next appointment to ensure we have an accurate record of your prescriptions. [x]   Health Diaries: If you're monitoring any health conditions at home, keeping a diary of your readings can be very helpful for discussions at your next appointment.  Billing [x]   X-ray & Lab Orders: These are billed by separate companies. Contact the invoicing company directly for questions or concerns. [x]   Visit Charges: Discuss any billing inquiries with our administrative services team.  Your Satisfaction Matters [x]   Share Your Experience: We strive for your satisfaction! If you have any complaints, or preferably compliments, please let Dr. Jesus know directly or contact our Practice Administrators, Manuelita Rubin or [employer], by asking at the front desk.   Reviewing Your Records [x]   Review this early draft of your clinical encounter notes below and the final encounter summary tomorrow on MyChart after its been completed.  All orders placed so far are visible here: Underweight on examination  Multifactorial functional impairment  Cervical myofascial pain syndrome  Pharyngoesophageal dysphagia  Coordination of complex care  Cervicogenic headache -     Lidocaine ; Apply 1 Application topically as needed.  Dispense: 35.44 g; Refill: 0 -     HYDROcodone -Acetaminophen ; Take 1 tablet by mouth daily as needed for moderate pain (pain score 4-6) or severe pain (pain score 7-10) (use for pain associated with spine injection.).  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   At high risk for pain from procedure -  Lidocaine ; Apply 1 Application topically as needed.  Dispense: 35.44 g; Refill: 0 -      HYDROcodone -Acetaminophen ; Take 1 tablet by mouth daily as needed for moderate pain (pain score 4-6) or severe pain (pain score 7-10) (use for pain associated with spine injection.).  Dispense: 3 tablet; Refill: 0  Traumatic brain injury, with unknown loss of consciousness status, sequela (HCC) -     Ambulatory referral to Psychiatry

## 2024-05-01 NOTE — Assessment & Plan Note (Addendum)
 Try lidocaine  prior to dry needling and other injections.

## 2024-05-01 NOTE — Progress Notes (Signed)
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 93/79/7975 12  6 - 20 mg/dL Final   Creatinine, Ser 04/12/2023 0.79  0.61 - 1.24 mg/dL Final   Calcium  04/12/2023  9.9  8.9 - 10.3 mg/dL Final   GFR, Estimated 04/12/2023 >60  >60 mL/min Final   Anion gap 04/12/2023 8  5 - 15 Final  Office Visit on 03/05/2023  Component Date Value Ref Range Status   Vitamin B-12 03/05/2023 533  211 - 911 pg/mL Final   Folate 03/05/2023 >23.9  >5.9 ng/mL Final   Methylmalonic Acid, Quant 03/05/2023 83 (L)  87 - 318 nmol/L Final   Ferritin 03/05/2023 35.8  22.0 - 322.0 ng/mL Final   WBC 03/05/2023 4.1  4.0 - 10.5 K/uL Final   RBC 03/05/2023 4.84  4.22 - 5.81 Mil/uL Final   Hemoglobin 03/05/2023 14.1  13.0 - 17.0 g/dL Final   HCT 94/86/7975 41.5  39.0 - 52.0 % Final   MCV 03/05/2023 85.6  78.0 - 100.0 fl Final   MCHC 03/05/2023 34.0  30.0 - 36.0 g/dL Final   RDW 94/86/7975 13.3  11.5 - 15.5 % Final   Platelets 03/05/2023 190.0  150.0 - 400.0 K/uL Final   Neutrophils Relative % 03/05/2023 55.7  43.0 - 77.0 % Final   Lymphocytes Relative 03/05/2023 31.6  12.0 - 46.0 % Final   Monocytes  Relative 03/05/2023 10.7  3.0 - 12.0 % Final   Eosinophils Relative 03/05/2023 1.0  0.0 - 5.0 % Final   Basophils Relative 03/05/2023 1.0  0.0 - 3.0 % Final   Neutro Abs 03/05/2023 2.3  1.4 - 7.7 K/uL Final   Lymphs Abs 03/05/2023 1.3  0.7 - 4.0 K/uL Final   Monocytes Absolute 03/05/2023 0.4  0.1 - 1.0 K/uL Final   Eosinophils Absolute 03/05/2023 0.0  0.0 - 0.7 K/uL Final   Basophils Absolute 03/05/2023 0.0  0.0 - 0.1 K/uL Final   Sodium 03/05/2023 140  135 - 145 mEq/L Final   Potassium 03/05/2023 4.5  3.5 - 5.1 mEq/L Final   Chloride 03/05/2023 101  96 - 112 mEq/L Final   CO2 03/05/2023 30  19 - 32 mEq/L Final   Glucose, Bld 03/05/2023 74  70 - 99 mg/dL Final   BUN 94/86/7975 10  6 - 23 mg/dL Final   Creatinine, Ser 03/05/2023 0.72  0.40 - 1.50 mg/dL Final   GFR 94/86/7975 122.22  >60.00 mL/min Final   Calcium  03/05/2023 9.9  8.4 - 10.5 mg/dL Final   TSH 94/86/7975 0.98  0.35 - 5.50 uIU/mL Final   Magnesium  03/05/2023 1.9  1.5 - 2.5 mg/dL Final  Admission on 95/90/7975, Discharged  on 01/30/2023  Component Date Value Ref Range Status   Sodium 01/30/2023 140  135 - 145 mmol/L Final   Potassium 01/30/2023 4.2  3.5 - 5.1 mmol/L Final   Chloride 01/30/2023 101  98 - 111 mmol/L Final   CO2 01/30/2023 27  22 - 32 mmol/L Final   Glucose, Bld 01/30/2023 108 (H)  70 - 99 mg/dL Final   BUN 95/90/7975 <5 (L)  6 - 20 mg/dL Final   Creatinine, Ser 01/30/2023 0.88  0.61 - 1.24 mg/dL Final   Calcium  01/30/2023 9.6  8.9 - 10.3 mg/dL Final   GFR, Estimated 01/30/2023 >60  >60 mL/min Final   Anion gap 01/30/2023 12  5 - 15 Final  Clinical Support on 01/16/2023  Component Date Value Ref Range Status   TB Skin Test 01/18/2023 Negative   Final   Induration 01/18/2023 0  mm Final  Lab on 01/16/2023  Component Date Value Ref Range Status   Anti Nuclear Antibody (ANA) 01/16/2023 NEGATIVE  NEGATIVE Final   ds DNA Ab 01/16/2023 1  IU/mL Final   Scleroderma (Scl-70) (ENA) Antibod* 01/16/2023 <1.0 NEG  <1.0 NEG AI Final   ENA SM Ab Ser-aCnc 01/16/2023 <1.0 NEG  <1.0 NEG AI Final   SM/RNP 01/16/2023 <1.0 NEG  <1.0 NEG AI Final   SSA (Ro) (ENA) Antibody, IgG 01/16/2023 <1.0 NEG  <1.0 NEG AI Final   SSB (La) (ENA) Antibody, IgG 01/16/2023 <1.0 NEG  <1.0 NEG AI Final   CRP 01/16/2023 <1.0  0.5 - 20.0 mg/dL Final   Sed Rate 96/73/7975 4  0 - 15 mm/hr Final   VITD 01/16/2023 24.94 (L)  30.00 - 100.00 ng/mL Final   Vitamin B-12 01/16/2023 524  211 - 911 pg/mL Final   Folate 01/16/2023 17.1  >5.9 ng/mL Final   HIV 1&2 Ab, 4th Generation 01/16/2023 NON-REACTIVE  NON-REACTIVE Final   Path Review 01/16/2023    Final   D-Dimer, Quant 01/16/2023 <0.19  <0.50 mcg/mL FEU Final  Appointment on 12/18/2022  Component Date Value Ref Range Status   Area-P 1/2 12/18/2022 3.24  cm2 Final   S' Lateral 12/18/2022 3.00  cm Final   Est EF 12/18/2022 60 - 65%  Final  Appointment on 12/18/2022  Component Date Value Ref Range Status   TSH 12/18/2022 1.300  0.450 - 4.500 uIU/mL Final   Cholesterol, Total  12/18/2022 158  100 - 199 mg/dL Final   Triglycerides 97/73/7975 53  0 - 149 mg/dL Final   HDL 97/73/7975 48  >39 mg/dL Final   VLDL Cholesterol Cal 12/18/2022 11  5 - 40 mg/dL Final   LDL Chol Calc (NIH) 12/18/2022 99  0 - 99 mg/dL Final   Chol/HDL Ratio 12/18/2022 3.3  0.0 - 5.0 ratio Final   Glucose 12/18/2022 88  70 - 99 mg/dL Final   BUN 97/73/7975 4 (L)  6 - 20 mg/dL Final   Creatinine, Ser 12/18/2022 0.95  0.76 - 1.27 mg/dL Final   eGFR 97/73/7975 110  >59 mL/min/1.73 Final   BUN/Creatinine Ratio 12/18/2022 4 (L)  9 - 20 Final   Sodium 12/18/2022 144  134 - 144 mmol/L Final   Potassium 12/18/2022 4.0  3.5 - 5.2 mmol/L Final   Chloride 12/18/2022 103  96 - 106 mmol/L Final   CO2 12/18/2022 21  20 - 29 mmol/L Final   Calcium  12/18/2022 9.8  8.7 - 10.2 mg/dL Final   Total Protein 97/73/7975 7.1  6.0 - 8.5 g/dL Final   Albumin 97/73/7975 4.8  4.3 - 5.2 g/dL Final   Globulin, Total 12/18/2022 2.3  1.5 - 4.5 g/dL Final   Albumin/Globulin Ratio 12/18/2022 2.1  1.2 - 2.2 Final   Bilirubin Total 12/18/2022 0.3  0.0 - 1.2 mg/dL Final   Alkaline Phosphatase 12/18/2022 55  44 - 121 IU/L Final   AST 12/18/2022 14  0 - 40 IU/L Final   ALT 12/18/2022 10  0 - 44 IU/L Final   WBC 12/18/2022 3.0 (L)  3.4 - 10.8 x10E3/uL Final   RBC 12/18/2022 4.83  4.14 - 5.80 x10E6/uL Final   Hemoglobin 12/18/2022 14.1  13.0 - 17.7 g/dL Final   Hematocrit 97/73/7975 40.5  37.5 - 51.0 % Final   MCV 12/18/2022 84  79 - 97 fL Final   MCH 12/18/2022 29.2  26.6 - 33.0 pg Final   MCHC 12/18/2022 34.8  31.5 - 35.7 g/dL Final   RDW 97/73/7975 13.1  11.6 - 15.4 % Final   Platelets 12/18/2022 195  150 - 450 x10E3/uL Final   Neutrophils 12/18/2022 45  Not Estab. % Final   Lymphs 12/18/2022 43  Not Estab. % Final   Monocytes 12/18/2022 10  Not Estab. % Final   Eos 12/18/2022 1  Not Estab. % Final   Basos 12/18/2022 1  Not Estab. % Final   Neutrophils Absolute 12/18/2022 1.4  1.4 - 7.0 x10E3/uL Final   Lymphocytes  Absolute 12/18/2022 1.3  0.7 - 3.1 x10E3/uL Final   Monocytes Absolute 12/18/2022 0.3  0.1 - 0.9 x10E3/uL Final   EOS (ABSOLUTE) 12/18/2022 0.0  0.0 - 0.4 x10E3/uL Final   Basophils Absolute 12/18/2022 0.0  0.0 - 0.2 x10E3/uL Final   Immature Granulocytes 12/18/2022 0  Not Estab. % Final   Immature Grans (Abs) 12/18/2022 0.0  0.0 - 0.1 x10E3/uL Final  Office Visit on 11/24/2022  Component Date Value Ref Range Status   Sed Rate 11/24/2022 2  0 - 15 mm/hr Final   CRP 11/24/2022 6  0 - 10 mg/L Final  Admission on 11/22/2022, Discharged on 11/22/2022  Component Date Value Ref Range Status   Sodium 11/22/2022 137  135 - 145 mmol/L Final   Potassium 11/22/2022 4.1  3.5 -  5.1 mmol/L Final   Chloride 11/22/2022 103  98 - 111 mmol/L Final   CO2 11/22/2022 27  22 - 32 mmol/L Final   Glucose, Bld 11/22/2022 92  70 - 99 mg/dL Final   BUN 98/68/7975 5 (L)  6 - 20 mg/dL Final   Creatinine, Ser 11/22/2022 0.82  0.61 - 1.24 mg/dL Final   Calcium  11/22/2022 9.7  8.9 - 10.3 mg/dL Final   GFR, Estimated 11/22/2022 >60  >60 mL/min Final   Anion gap 11/22/2022 7  5 - 15 Final   WBC 11/22/2022 4.1  4.0 - 10.5 K/uL Final   RBC 11/22/2022 5.14  4.22 - 5.81 MIL/uL Final   Hemoglobin 11/22/2022 14.4  13.0 - 17.0 g/dL Final   HCT 98/68/7975 41.7  39.0 - 52.0 % Final   MCV 11/22/2022 81.1  80.0 - 100.0 fL Final   MCH 11/22/2022 28.0  26.0 - 34.0 pg Final   MCHC 11/22/2022 34.5  30.0 - 36.0 g/dL Final   RDW 98/68/7975 12.5  11.5 - 15.5 % Final   Platelets 11/22/2022 216  150 - 400 K/uL Final   nRBC 11/22/2022 0.0  0.0 - 0.2 % Final   Troponin I (High Sensitivity) 11/22/2022 6  <18 ng/L Final   Sodium 11/22/2022 140  135 - 145 mmol/L Final   Potassium 11/22/2022 4.2  3.5 - 5.1 mmol/L Final   Chloride 11/22/2022 100  98 - 111 mmol/L Final   BUN 11/22/2022 <3 (L)  6 - 20 mg/dL Final   Creatinine, Ser 11/22/2022 0.80  0.61 - 1.24 mg/dL Final   Glucose, Bld 98/68/7975 89  70 - 99 mg/dL Final   Calcium , Ion  11/22/2022 1.26  1.15 - 1.40 mmol/L Final   TCO2 11/22/2022 26  22 - 32 mmol/L Final   Hemoglobin 11/22/2022 14.6  13.0 - 17.0 g/dL Final   HCT 98/68/7975 43.0  39.0 - 52.0 % Final   Troponin I (High Sensitivity) 11/22/2022 5  <18 ng/L Final  There may be more visits with results that are not included.  No image results found. No results found.       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 At high risk for pain from procedure  Traumatic brain injury, with unknown loss of consciousness status, sequela (HCC)   Assessment and Plan Assessment & Plan Traumatic brain injury with ongoing post-concussion symptoms   He experiences complex headache syndrome, neck pain, and eye symptoms related to TBI. Previous treatments, including nerve blocks and various therapies, have not provided relief. There is concern for a possible CSF leak, which has not yet been investigated. He reports cognitive changes and seeks further evaluation and treatment options. Celebrex  and Lyrica  have not provided significant relief. He is considering retrying nerve blocks and exploring options such as Botox injections. He is also interested in a different neuropsychologist for cognitive evaluation. Discuss with a neurologist about potential CSF leak and order a CT myelogram. Consider retrying nerve blocks with pre-procedural lidocaine  application. Prescribe lidocaine  ointment for topical application and hydrocodone  for pain management during procedures. Refer to a different neuropsychologist for cognitive evaluation. Consider Botox injections into the neck for pain  management and explore dry needling with lidocaine  application.  Esophageal dysmotility   He reports progressive dysphagia, with food feeling stuck in the esophagus, consistent with previously diagnosed esophageal dysmotility. A GI appointment is scheduled to address these symptoms. He experiences nutritional challenges due to dysphagia and uses nutritional drinks to supplement intake. Follow up with a GI specialist for further evaluation and management. Encourage the use of nutritional drinks to supplement dietary intake.  Nutritional status concern   He expresses concern about nutritional status due to dysphagia and poor dietary intake. There is a need to assess nutritional deficiencies and hydration status. Order blood work to assess nutritional status, including vitamin D  levels and other relevant markers.  Recording duration: 36 minutes     ORDER ASSOCIATIONS  #   DIAGNOSIS / CONDITION ICD-10 ENCOUNTER ORDER     ICD-10-CM   1. Underweight on examination  R63.6     2. Multifactorial functional impairment  R53.81     3. Cervical myofascial pain syndrome  M79.18     4. Pharyngoesophageal dysphagia  R13.14     5. Coordination of complex care  Z71.89     6. Cervicogenic headache  G44.86 lidocaine  (XYLOCAINE ) 5 % ointment    HYDROcodone -acetaminophen  (NORCO/VICODIN) 5-325 MG tablet    7. Muscle tension dysphonia  R49.0     8. 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 At high risk for pain from procedure  Z91.89 lidocaine  (XYLOCAINE ) 5 % ointment    HYDROcodone -acetaminophen  (NORCO/VICODIN) 5-325 MG tablet    10. Traumatic brain injury, with unknown loss of consciousness status, sequela (HCC)  S06.9XAS Ambulatory referral to Psychiatry      Meds ordered this encounter  Medications   lidocaine  (XYLOCAINE ) 5 % ointment    Sig: Apply 1 Application topically as needed.    Dispense:  35.44 g    Refill:  0    HYDROcodone -acetaminophen  (NORCO/VICODIN) 5-325 MG tablet    Sig: Take 1 tablet by mouth daily as needed for moderate pain (pain score 4-6) or severe pain (pain score 7-10) (use for pain associated with spine injection.).    Dispense:  3 tablet    Refill:  0    Orders Placed This Encounter  Procedures   Ambulatory referral to Psychiatry    Referral Priority:   Routine    Referral Type:   Psychiatric    Referral Reason:   Specialty Services Required    Requested Specialty:   Psychology    Number of Visits Requested:   1   ED Discharge Orders          Ordered    lidocaine  (XYLOCAINE ) 5 % ointment  As needed        05/01/24 1011    HYDROcodone -acetaminophen  (NORCO/VICODIN) 5-325 MG tablet  Daily PRN        05/01/24 1011    Ambulatory referral to Psychiatry       Comments: Neuropsychology evaluation for traumatic brain injury desired by patient. CBT for chronic pain for patient also desired by Primary Care Provider (PCP).   05/01/24 1011              This document was synthesized by artificial intelligence ([employer]) using HIPAA-compliant recording of the clinical interaction;   We discussed the use of AI scribe software for clinical note transcription with the patient, who gave verbal consent to proceed. additional Info: This encounter employed state-of-the-art, real-time, collaborative documentation. The patient actively reviewed and assisted in updating their electronic medical record on  a shared screen, ensuring transparency and facilitating joint problem-solving for the problem list, overview, and plan. This approach promotes accurate, informed care. The treatment plan was discussed and reviewed in detail, including medication safety, potential side effects, and all patient questions. We confirmed understanding and comfort with the plan. Follow-up instructions were established, including contacting the office for any concerns, returning if symptoms worsen, persist, or new symptoms  develop, and precautions for potential emergency department visits.

## 2024-05-02 DIAGNOSIS — Z7689 Persons encountering health services in other specified circumstances: Secondary | ICD-10-CM | POA: Diagnosis not present

## 2024-05-03 DIAGNOSIS — Z419 Encounter for procedure for purposes other than remedying health state, unspecified: Secondary | ICD-10-CM | POA: Diagnosis not present

## 2024-05-07 DIAGNOSIS — Z7689 Persons encountering health services in other specified circumstances: Secondary | ICD-10-CM | POA: Diagnosis not present

## 2024-05-12 ENCOUNTER — Ambulatory Visit: Admitting: Internal Medicine

## 2024-05-12 DIAGNOSIS — Z7689 Persons encountering health services in other specified circumstances: Secondary | ICD-10-CM | POA: Diagnosis not present

## 2024-05-18 DIAGNOSIS — Z7689 Persons encountering health services in other specified circumstances: Secondary | ICD-10-CM | POA: Diagnosis not present

## 2024-05-23 ENCOUNTER — Ambulatory Visit (INDEPENDENT_AMBULATORY_CARE_PROVIDER_SITE_OTHER): Admitting: Internal Medicine

## 2024-05-23 ENCOUNTER — Encounter: Payer: Self-pay | Admitting: Internal Medicine

## 2024-05-23 VITALS — BP 110/64 | HR 76 | Temp 98.0°F | Ht 66.0 in | Wt 133.8 lb

## 2024-05-23 DIAGNOSIS — M7918 Myalgia, other site: Secondary | ICD-10-CM

## 2024-05-23 DIAGNOSIS — E46 Unspecified protein-calorie malnutrition: Secondary | ICD-10-CM

## 2024-05-23 DIAGNOSIS — R252 Cramp and spasm: Secondary | ICD-10-CM | POA: Diagnosis not present

## 2024-05-23 DIAGNOSIS — F0781 Postconcussional syndrome: Secondary | ICD-10-CM

## 2024-05-23 DIAGNOSIS — G894 Chronic pain syndrome: Secondary | ICD-10-CM

## 2024-05-23 DIAGNOSIS — M25551 Pain in right hip: Secondary | ICD-10-CM

## 2024-05-23 DIAGNOSIS — R5383 Other fatigue: Secondary | ICD-10-CM | POA: Diagnosis not present

## 2024-05-23 DIAGNOSIS — R1314 Dysphagia, pharyngoesophageal phase: Secondary | ICD-10-CM

## 2024-05-23 DIAGNOSIS — Z7689 Persons encountering health services in other specified circumstances: Secondary | ICD-10-CM | POA: Diagnosis not present

## 2024-05-23 DIAGNOSIS — Z7189 Other specified counseling: Secondary | ICD-10-CM

## 2024-05-23 LAB — COMPREHENSIVE METABOLIC PANEL WITH GFR
ALT: 16 U/L (ref 0–53)
AST: 20 U/L (ref 0–37)
Albumin: 4.8 g/dL (ref 3.5–5.2)
Alkaline Phosphatase: 76 U/L (ref 39–117)
BUN: 19 mg/dL (ref 6–23)
CO2: 34 meq/L — ABNORMAL HIGH (ref 19–32)
Calcium: 9.8 mg/dL (ref 8.4–10.5)
Chloride: 100 meq/L (ref 96–112)
Creatinine, Ser: 0.88 mg/dL (ref 0.40–1.50)
GFR: 114.05 mL/min (ref >60.00)
Glucose, Bld: 76 mg/dL (ref 70–99)
Potassium: 4.7 meq/L (ref 3.5–5.1)
Sodium: 141 meq/L (ref 135–145)
Total Bilirubin: 0.3 mg/dL (ref 0.2–1.2)
Total Protein: 7.6 g/dL (ref 6.0–8.3)

## 2024-05-23 LAB — LIPID PANEL
Cholesterol: 145 mg/dL (ref 0–200)
HDL: 44.8 mg/dL (ref >39.00)
LDL Cholesterol: 90 mg/dL (ref 0–99)
NonHDL: 100.33
Total CHOL/HDL Ratio: 3
Triglycerides: 54 mg/dL (ref 0.0–149.0)
VLDL: 10.8 mg/dL (ref 0.0–40.0)

## 2024-05-23 LAB — CBC WITH DIFFERENTIAL/PLATELET
Basophils Absolute: 0 K/uL (ref 0.0–0.1)
Basophils Relative: 0.4 % (ref 0.0–3.0)
Eosinophils Absolute: 0 K/uL (ref 0.0–0.7)
Eosinophils Relative: 0.2 % (ref 0.0–5.0)
HCT: 42.9 % (ref 39.0–52.0)
Hemoglobin: 14.6 g/dL (ref 13.0–17.0)
Lymphocytes Relative: 31.5 % (ref 12.0–46.0)
Lymphs Abs: 1.3 K/uL (ref 0.7–4.0)
MCHC: 34.1 g/dL (ref 30.0–36.0)
MCV: 86.4 fl (ref 78.0–100.0)
Monocytes Absolute: 0.3 K/uL (ref 0.1–1.0)
Monocytes Relative: 8 % (ref 3.0–12.0)
Neutro Abs: 2.6 K/uL (ref 1.4–7.7)
Neutrophils Relative %: 59.9 % (ref 43.0–77.0)
Platelets: 150 K/uL (ref 150.0–400.0)
RBC: 4.97 Mil/uL (ref 4.22–5.81)
RDW: 12.7 % (ref 11.5–15.5)
WBC: 4.3 K/uL (ref 4.0–10.5)

## 2024-05-23 LAB — B12 AND FOLATE PANEL
Folate: 22.3 ng/mL (ref >5.9)
Vitamin B-12: 768 pg/mL (ref 211–911)

## 2024-05-23 LAB — HEMOGLOBIN A1C: Hgb A1c MFr Bld: 5.5 % (ref 4.6–6.5)

## 2024-05-23 LAB — VITAMIN D 25 HYDROXY (VIT D DEFICIENCY, FRACTURES): VITD: 51.5 ng/mL (ref 30.00–100.00)

## 2024-05-23 NOTE — Progress Notes (Signed)
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  0.0 - 0.4 x10E3/uL Final   Basophils Absolute 12/18/2022 0.0  0.0 - 0.2 x10E3/uL Final   Immature Granulocytes 12/18/2022 0  Not Estab. % Final   Immature Grans (Abs) 12/18/2022 0.0  0.0 - 0.1 x10E3/uL Final  Office Visit on 11/24/2022  Component Date Value Ref Range Status   Sed Rate 11/24/2022 2  0 - 15 mm/hr Final   CRP 11/24/2022 6  0 - 10 mg/L Final  There may be more visits with results that are not included.  No image results found. No results found.       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 Dry needling and massage therapy are promising non-invasive options. An occipital nerve block is scheduled, but dry needling is prioritized due to its less invasive nature. Concerns about potential nerve damage from interventions are addressed, with reassurance about the safety of dry needling. Dry needling may alleviate symptoms such as eye pain, headaches, swallowing difficulties, and esophageal dysmotility. Trying multiple practitioners for dry needling and massage is important due to variability in technique. Facilitate referral for dry needling and massage therapy. Prioritize dry needling over occipital nerve block. Discuss potential nerve block if dry needling is ineffective. Reassure about the safety of dry needling and its potential benefits.  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 Dry needling and massage therapy are beneficial for managing spasticity and associated symptoms. Previous interventions, including trigger point injections, were too painful. Lidocaine  may be used to reduce discomfort during dry needling. Dry needling may reprocess nerve signaling and alleviate spasm and tension in the neck. Facilitate referral for dry needling and massage therapy. Use lidocaine  to reduce discomfort during dry needling.  Esophageal dysmotility with dysphagia, pharyngoesophageal phase   Esophageal dysmotility with dysphagia persists despite previous interventions. Rabeprazole is used to manage acid reflux and prevent esophageal damage. Surgical intervention (myotomy) is high risk and not recommended unless significant weight loss occurs. Nutritional support through protein supplements is ongoing. The risks of myotomy surgery, including potential worsening of symptoms, were discussed. Managing acid reflux to prevent further esophageal damage is  emphasized. Continue Rabeprazole for acid reflux management. Avoid surgical intervention unless significant weight loss occurs. Continue nutritional support with protein supplements. Attend scheduled swallow appointment with ENT.  Nutritional status concern   Nutritional status is a concern due to esophageal dysmotility and dysphagia. Weight gain is achieved through protein supplements. The current diet lacks fruits and vegetables, raising concerns about potential nutrient deficiencies. Maintaining nutritional status through supplements and monitoring is important. Order a full blood panel to assess nutritional status, including B12 and vitamin D  levels. Continue protein supplements to support weight gain. Chronic pain syndrome Other fatigue Will order lab testing to guide management.  Right hip pain Right hip pain has some relief from a steroid injection. Lumbar MRI shows potential nerve involvement causing radiating pain. Follow-up with a specialist to discuss MRI results and potential need for lumbar steroid injection. Coordinating care with specialists to address ongoing pain  is emphasized. Follow up with a specialist to discuss lumbar MRI results. Consider lumbar steroid injection if recommended by the specialist.   ORDER ASSOCIATIONS  #   DIAGNOSIS / CONDITION ICD-10 ENCOUNTER ORDER     ICD-10-CM   1. Pharyngoesophageal dysphagia  R13.14 RABEprazole (ACIPHEX) 20 MG tablet    Ambulatory referral to Physical Therapy    Ambulatory referral to Psychology    2. Other fatigue  R53.83 Lipid panel    Comprehensive metabolic panel with GFR    CBC with Differential/Platelet    TSH Rfx on Abnormal to Free T4    Hemoglobin A1c    HIV Antibody (routine testing w rflx)    HCV Ab w Reflex to Quant PCR    B12 and Folate Panel    Vitamin D  (25 hydroxy)    Urinalysis w microscopic + reflex cultur    REFLEXIVE URINE CULTURE    Interpretation:    3. Cervical myofascial pain syndrome  M79.18      4. Postconcussion syndrome  F07.81 Ambulatory referral to Physical Therapy    Ambulatory referral to Psychology    5. 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 Malnutrition, unspecified type (HCC)  E46 Lipid panel    Comprehensive metabolic panel with GFR    CBC with Differential/Platelet    TSH Rfx on Abnormal to Free T4    Hemoglobin A1c    HIV Antibody (routine testing w rflx)    HCV Ab w Reflex to Quant PCR    B12 and Folate Panel    Vitamin D  (25 hydroxy)    Urinalysis w microscopic + reflex cultur    7. Coordination of complex care  Z71.89 Ambulatory referral to Psychology    8. Chronic pain syndrome  G89.4 Ambulatory referral to Physical Therapy    Ambulatory referral to Psychology    9. Right hip pain  M25.551 Urinalysis w microscopic + reflex cultur        Ambulatory referral to Physical Therapy       Comments: Dry needling neck and back of head for headache syndrome  also deep tissue massage neck area.     Ambulatory referral to Psychology       Comments: Feels bad all the time physically, negative impacts on mental health, request neuropsychiatry evaluation and treatment of cognitive effects and mental health effects of traumatic brain injury and post concussion  syndrome.         This document was synthesized by artificial intelligence ([employer]) using HIPAA-compliant recording of the clinical interaction;   We discussed the use of AI scribe software for clinical note transcription with the patient, who gave verbal consent to proceed. additional Info: This encounter employed state-of-the-art,  real-time, collaborative documentation. The patient actively reviewed and assisted in updating their electronic medical record on a shared screen, ensuring transparency and facilitating joint problem-solving for the problem list, overview, and plan. This approach promotes accurate, informed care. The treatment plan was discussed and reviewed in detail, including medication safety, potential side effects, and all  patient questions. We confirmed understanding and comfort with the plan. Follow-up instructions were established, including contacting the office for any concerns, returning if symptoms worsen, persist, or new symptoms develop, and precautions for potential emergency department visits.

## 2024-05-24 ENCOUNTER — Ambulatory Visit: Payer: Self-pay | Admitting: Internal Medicine

## 2024-05-24 LAB — URINALYSIS W MICROSCOPIC + REFLEX CULTURE
Bacteria, UA: NONE SEEN /HPF
Bilirubin Urine: NEGATIVE
Glucose, UA: NEGATIVE
Hgb urine dipstick: NEGATIVE
Hyaline Cast: NONE SEEN /LPF
Ketones, ur: NEGATIVE
Leukocyte Esterase: NEGATIVE
Nitrites, Initial: NEGATIVE
Protein, ur: NEGATIVE
RBC / HPF: NONE SEEN /HPF (ref 0–2)
Specific Gravity, Urine: 1.012 (ref 1.001–1.035)
Squamous Epithelial / HPF: NONE SEEN /HPF (ref <=5)
WBC, UA: NONE SEEN /HPF (ref 0–5)
pH: 8 (ref 5.0–8.0)

## 2024-05-24 LAB — HCV AB W REFLEX TO QUANT PCR: HCV Ab: NONREACTIVE

## 2024-05-24 LAB — TSH RFX ON ABNORMAL TO FREE T4: TSH: 2.11 u[IU]/mL (ref 0.450–4.500)

## 2024-05-24 LAB — HIV ANTIBODY (ROUTINE TESTING W REFLEX): HIV 1&2 Ab, 4th Generation: NONREACTIVE

## 2024-05-24 LAB — HCV INTERPRETATION

## 2024-05-24 LAB — NO CULTURE INDICATED

## 2024-05-24 NOTE — Assessment & Plan Note (Signed)
 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 Dry needling and massage therapy are promising non-invasive options. An occipital nerve block is scheduled, but dry needling is prioritized due to its less invasive nature. Concerns about potential nerve damage from interventions are addressed, with reassurance about the safety of dry needling. Dry needling may alleviate symptoms such as eye pain, headaches, swallowing difficulties, and esophageal dysmotility. Trying multiple practitioners for dry needling and massage is important due to variability in technique. Facilitate referral for dry needling and massage therapy. Prioritize dry needling over occipital nerve block. Discuss potential nerve block if dry needling is ineffective. Reassure about the safety of dry needling and its potential benefits.  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 Dry needling and massage therapy are beneficial for managing spasticity and associated symptoms. Previous interventions, including trigger point injections, were too painful. Lidocaine  may be used to reduce discomfort during dry needling. Dry needling may reprocess nerve signaling and alleviate spasm and tension in the neck. Facilitate referral for dry needling and massage therapy. Use lidocaine  to reduce discomfort during dry needling.  Esophageal dysmotility with dysphagia, pharyngoesophageal phase   Esophageal dysmotility with dysphagia persists despite previous interventions. Rabeprazole is used to manage acid reflux and prevent esophageal damage. Surgical intervention (myotomy) is high risk and not recommended unless significant weight loss occurs. Nutritional support through protein supplements is  ongoing. The risks of myotomy surgery, including potential worsening of symptoms, were discussed. Managing acid reflux to prevent further esophageal damage is emphasized. Continue Rabeprazole for acid reflux management. Avoid surgical intervention unless significant weight loss occurs. Continue nutritional support with protein supplements. Attend scheduled swallow appointment with ENT.  Nutritional status concern   Nutritional status is a concern due to esophageal dysmotility and dysphagia. Weight gain is achieved through protein supplements. The current diet lacks fruits and vegetables, raising concerns about potential nutrient deficiencies. Maintaining nutritional status through supplements and monitoring is important. Order a full blood panel to assess nutritional status, including B12 and vitamin D  levels. Continue protein supplements to support weight gain.

## 2024-05-24 NOTE — Patient Instructions (Signed)
 It was a pleasure seeing you today! Your health and satisfaction are our top priorities.  Nicholas Cone, MD  VISIT SUMMARY: Today, you were seen for management of your chronic headaches, swallowing difficulties, and right hip pain. We discussed your ongoing treatments and explored new options to help manage your symptoms. You are doing well with your current job and living situation, which is great to hear.  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 We will prioritize dry needling and massage therapy to help alleviate your symptoms. An occipital nerve block is scheduled, but we will consider it only if dry needling is ineffective. We will facilitate a referral for these therapies and reassure you about their safety and potential benefits.  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 Dry needling and massage therapy are recommended to manage your symptoms. Lidocaine  may be used during dry needling to reduce discomfort. We will facilitate a referral for these therapies.  -ESOPHAGEAL DYSMOTILITY WITH DYSPHAGIA, PHARYNGOESOPHAGEAL PHASE: This condition affects your ability to swallow properly. You will continue taking Rabeprazole to manage acid reflux and prevent further esophageal damage. Surgical intervention is not recommended unless significant weight loss occurs. Continue with your protein supplements and attend your scheduled swallow appointment with the ENT specialist.  -NUTRITIONAL STATUS CONCERN: Due to your swallowing difficulties, maintaining proper nutrition is important. We will order a full blood panel to assess your nutritional status, including B12 and vitamin D  levels. Continue with your protein supplements to  support weight gain.  -PAIN IN RIGHT HIP: You have been experiencing right hip pain, which has been somewhat relieved by a steroid injection. We will follow up with a specialist to discuss your lumbar MRI results and consider a lumbar steroid injection if recommended.  INSTRUCTIONS: Please follow up with the specialist to discuss your lumbar MRI results. Continue with your current treatments and attend your scheduled swallow appointment with the ENT specialist. We will facilitate referrals for dry needling and massage therapy. Additionally, we will order a full blood panel to assess your nutritional status.  Your Providers PCP: [name], MD,  [phone]) Referring Provider: [name], MD,  [phone]) Care Team Provider: Charlyne Helling, MD,  [phone]) Care Team Provider: Brien Garnette Pepper, MD,  [phone]) Care Team Provider: Jefrey Bruckner, MD,  [phone]) Care Team Provider: Leland Rankin Argyle, MD,  [phone]) Care Team Provider: Georgina Ozell LABOR, MD,  [phone]) Care Team Provider: Darlean Ozell NOVAK, MD,  [phone]) Care Team Provider: Case, [place], MD,  [phone]) Care Team Provider: [name], MD,  [phone]) Care Team Provider: Delene Thersia PARAS Care Team Provider: [name], MD,  [phone]) Care Team Provider: [name], FNP,  [phone]) Care Team Provider: [name], MD,  [phone]) Care Team Provider: [name],  [phone]) Care Team Provider: [name], [place],  [phone]) Care Team Provider: [name], MD,  [phone])  NEXT STEPS: [x]  Early Intervention: Schedule sooner appointment, call our on-call services, or go to emergency room if there is any significant Increase in pain or discomfort New or worsening symptoms Sudden or severe changes in your health [x]  Flexible Follow-Up: We recommend a Return in about 1 month  (around 06/23/2024) for chronic disease monitoring and management. for optimal routine care. This allows for progress monitoring and treatment adjustments. [x]   Preventive Care: Schedule your annual preventive care visit! It's typically covered by insurance and helps identify potential health issues early. [x]  Lab & X-ray Appointments: Incomplete tests scheduled today, or call to schedule. X-rays: [facility] (M-F, 8:30am-noon or 1pm-5pm). [x]  Medical Information Release: Sign a release form at front desk to obtain relevant medical information we don't have.  MAKING THE MOST OF OUR FOCUSED 20 MINUTE APPOINTMENTS: [x]   Clearly state your top concerns at the beginning of the visit to focus our discussion [x]   If you anticipate you will need more time, please inform the front desk during scheduling - we can book multiple appointments in the same week. [x]   If you have transportation problems- use our convenient video appointments or ask about transportation support. [x]   We can get down to business faster if you use MyChart to update information before the visit and submit non-urgent questions before your visit. Thank you for taking the time to provide details through MyChart.  Let our nurse know and she can import this information into your encounter documents.  Arrival and Wait Times: [x]   Arriving on time ensures that everyone receives prompt attention. [x]   Early morning (8a) and afternoon (1p) appointments tend to have shortest wait times. [x]   Unfortunately, we cannot delay appointments for late arrivals or hold slots during phone calls.  Getting Answers and Following Up [x]   Simple Questions & Concerns: For quick questions or basic follow-up after your visit, reach us  at [phone] or MyChart messaging. [x]   Complex Concerns: If your concern is more complex, scheduling an appointment might be best. Discuss this with the staff to find the most suitable option. [x]   Lab &  Imaging Results: We'll contact you directly if results are abnormal or you don't use MyChart. Most normal results will be on MyChart within 2-3 business days, with a review message from Dr. Jesus. Haven't heard back in 2 weeks? Need results sooner? Contact us  at [phone]. [x]   Referrals: Our referral coordinator will manage specialist referrals. The specialist's office should contact you within 2 weeks to schedule an appointment. Call us  if you haven't heard from them after 2 weeks.  Staying Connected [x]   MyChart: Activate your MyChart for the fastest way to access results and message us . See the last page of this paperwork for instructions on how to activate.  Bring to Your Next Appointment [x]   Medications: Please bring all your medication bottles to your next appointment to ensure we have an accurate record of your prescriptions. [x]   Health Diaries: If you're monitoring any health conditions at home, keeping a diary of your readings can be very helpful for discussions at your next appointment.  Billing [x]   X-ray & Lab Orders: These are billed by separate companies. Contact the invoicing company directly for questions or concerns. [x]   Visit Charges: Discuss any billing inquiries with our administrative services team.  Your Satisfaction Matters [x]   Share Your Experience: We strive for your satisfaction! If you have any complaints, or preferably compliments, please let Dr. Jesus know directly or contact our Practice Administrators, Manuelita Rubin or [employer], by asking at the front desk.   Reviewing Your Records [x]   Review this early draft of your clinical encounter notes below and the final encounter summary tomorrow on MyChart after its been completed.  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 Dry needling and massage therapy are promising non-invasive options. An occipital nerve block is scheduled, but dry needling is prioritized due to its less invasive nature. Concerns about potential nerve damage from interventions are addressed, with reassurance about the safety of dry needling. Dry needling may alleviate symptoms such as eye pain, headaches, swallowing difficulties, and esophageal dysmotility. Trying multiple practitioners for dry needling and massage is important due to variability in technique. Facilitate referral for dry needling and massage therapy. Prioritize dry needling over occipital nerve block. Discuss potential nerve block if dry needling is ineffective. Reassure about the safety of dry needling and its potential benefits.  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 Dry needling and massage therapy are beneficial for managing spasticity and associated symptoms. Previous interventions, including trigger point injections, were too painful. Lidocaine  may be used to reduce discomfort during dry needling. Dry needling may reprocess nerve signaling and alleviate spasm and tension in the neck. Facilitate referral for dry needling and massage therapy. Use lidocaine  to reduce discomfort during dry needling.  Esophageal dysmotility with dysphagia, pharyngoesophageal phase   Esophageal dysmotility with dysphagia persists despite previous interventions. Rabeprazole is used to manage acid reflux and prevent esophageal damage. Surgical intervention (myotomy) is high risk and not recommended unless significant weight loss occurs. Nutritional support through protein supplements is ongoing. The risks of myotomy surgery, including potential worsening of symptoms, were discussed. Managing acid reflux to prevent  further esophageal damage is emphasized. Continue Rabeprazole for acid reflux management. Avoid surgical intervention unless significant weight loss occurs. Continue nutritional support with protein supplements. Attend scheduled swallow appointment with ENT.  Nutritional status concern   Nutritional status is a concern due to esophageal dysmotility and dysphagia. Weight gain is achieved through protein supplements. The current diet lacks fruits and vegetables, raising concerns about potential nutrient deficiencies. Maintaining nutritional status through supplements and monitoring is important. Order a full blood panel to assess nutritional status, including B12 and vitamin D  levels. Continue protein supplements to support weight gain.  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 Dry needling and massage therapy are promising non-invasive options. An occipital nerve block is scheduled, but dry needling is prioritized due to its less invasive nature. Concerns about potential nerve damage from interventions are addressed, with reassurance about the safety of dry needling. Dry needling may alleviate symptoms such as eye pain, headaches, swallowing  difficulties, and esophageal dysmotility. Trying multiple practitioners for dry  needling and massage is important due to variability in technique. Facilitate referral for dry needling and massage therapy. Prioritize dry needling over occipital nerve block. Discuss potential nerve block if dry needling is ineffective. Reassure about the safety of dry needling and its potential benefits.  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 Dry needling and massage therapy are beneficial for managing spasticity and associated symptoms. Previous interventions, including trigger point injections, were too painful. Lidocaine  may be used to reduce discomfort during dry needling. Dry needling may reprocess nerve signaling and alleviate spasm and tension in the neck. Facilitate referral for dry needling and massage therapy. Use lidocaine  to reduce discomfort during dry needling.  Esophageal dysmotility with dysphagia, pharyngoesophageal phase   Esophageal dysmotility with dysphagia persists despite previous interventions. Rabeprazole is used to manage acid reflux and prevent esophageal damage. Surgical intervention (myotomy) is high risk and not recommended unless significant weight loss occurs. Nutritional support through protein supplements is ongoing. The risks of myotomy surgery, including potential worsening of symptoms, were discussed. Managing acid reflux to prevent further esophageal damage is emphasized. Continue Rabeprazole for acid reflux management. Avoid surgical intervention unless significant weight loss occurs. Continue nutritional support with protein supplements. Attend scheduled swallow appointment with ENT.  Nutritional status concern   Nutritional status is a concern due to esophageal dysmotility and dysphagia. Weight gain is achieved through protein supplements. The current diet lacks fruits and  vegetables, raising concerns about potential nutrient deficiencies. Maintaining nutritional status through supplements and monitoring is important. Order a full blood panel to assess nutritional status, including B12 and vitamin D  levels. Continue protein supplements to support weight gain.   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 Dry needling and massage therapy are promising non-invasive options. An occipital nerve block is scheduled, but dry needling is prioritized due to its less invasive nature. Concerns about potential nerve damage from interventions are addressed, with reassurance about the safety of dry needling. Dry needling may alleviate symptoms such as eye pain, headaches, swallowing difficulties, and esophageal dysmotility. Trying multiple practitioners for dry needling and massage is important due to variability in technique. Facilitate referral for dry needling and massage therapy. Prioritize dry needling over occipital nerve block. Discuss potential nerve block if dry needling is ineffective. Reassure about the safety of dry needling and its potential benefits.  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 Dry needling and massage therapy are beneficial for managing spasticity and associated symptoms. Previous interventions, including trigger point injections, were too painful. Lidocaine  may be used to reduce discomfort during dry needling. Dry needling may reprocess nerve signaling and alleviate spasm and tension in the neck. Facilitate referral for dry needling and massage therapy. Use lidocaine  to reduce discomfort during dry needling.  Esophageal dysmotility with dysphagia,  pharyngoesophageal phase   Esophageal dysmotility with dysphagia persists despite previous interventions. Rabeprazole is used to manage acid reflux and prevent esophageal damage. Surgical intervention (myotomy) is high risk and not recommended unless significant weight loss occurs. Nutritional support through protein supplements is ongoing. The risks of myotomy surgery, including potential worsening of symptoms, were discussed. Managing acid reflux to prevent further esophageal damage is emphasized. Continue Rabeprazole for acid reflux management. Avoid surgical intervention unless significant weight loss occurs. Continue  nutritional support with protein supplements. Attend scheduled swallow appointment with ENT.  Nutritional status concern   Nutritional status is a concern due to esophageal dysmotility and dysphagia. Weight gain is achieved through protein supplements. The current diet lacks fruits and vegetables, raising concerns about potential nutrient deficiencies. Maintaining nutritional status through supplements and monitoring is important. Order a full blood panel to assess nutritional status, including B12 and vitamin D  levels. Continue protein supplements to support weight gain.  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 Dry needling and massage therapy are promising non-invasive options. An occipital nerve block is scheduled, but dry needling is prioritized due to its less invasive nature. Concerns about potential nerve damage from interventions are addressed, with reassurance about the safety of dry needling. Dry needling may alleviate  symptoms such as eye pain, headaches, swallowing difficulties, and esophageal dysmotility. Trying multiple practitioners for dry needling and massage is important due to variability in technique. Facilitate referral for dry needling and massage therapy. Prioritize dry needling over occipital nerve block. Discuss potential nerve block if dry needling is ineffective. Reassure about the safety of dry needling and its potential benefits.  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 Dry needling and massage therapy are beneficial for managing spasticity and associated symptoms. Previous interventions, including trigger point injections, were too painful. Lidocaine  may be used to reduce discomfort during dry needling. Dry needling may reprocess nerve signaling and alleviate spasm and tension in the neck. Facilitate referral for dry needling and massage therapy. Use lidocaine  to reduce discomfort during dry needling.  Esophageal dysmotility with dysphagia, pharyngoesophageal phase   Esophageal dysmotility with dysphagia persists despite previous interventions. Rabeprazole is used to manage acid reflux and prevent esophageal damage. Surgical intervention (myotomy) is high risk and not recommended unless significant weight loss occurs. Nutritional support through protein supplements is ongoing. The risks of myotomy surgery, including potential worsening of symptoms, were discussed. Managing acid reflux to prevent further esophageal damage is emphasized. Continue Rabeprazole for acid reflux management. Avoid surgical intervention unless significant weight loss occurs. Continue nutritional support with protein supplements. Attend scheduled swallow appointment with ENT.  Nutritional status concern   Nutritional status is a concern due to esophageal dysmotility and dysphagia. Weight gain is achieved through protein  supplements. The current diet lacks fruits and vegetables, raising concerns about potential nutrient deficiencies. Maintaining nutritional status through supplements and monitoring is important. Order a full blood panel to assess nutritional status, including B12 and vitamin D  levels. Continue protein supplements to support weight gain.  Orders: -     Ambulatory referral to Physical Therapy -     Ambulatory referral to Psychology -     Lipid panel -     Comprehensive metabolic panel with GFR -     CBC with Differential/Platelet -     TSH Rfx on Abnormal to Free T4 -     HIV Antibody (routine testing w rflx) -     HCV Ab w Reflex to Quant PCR -     B12 and Folate Panel -     VITAMIN D  25 Hydroxy (Vit-D Deficiency, Fractures)  Malnutrition, unspecified type (HCC) -  Lipid panel -     Comprehensive metabolic panel with GFR -     CBC with Differential/Platelet -     TSH Rfx on Abnormal to Free T4 -     Hemoglobin A1c -     HIV Antibody (routine testing w rflx) -     HCV Ab w Reflex to Quant PCR -     B12 and Folate Panel -     VITAMIN D  25 Hydroxy (Vit-D Deficiency, Fractures) -     Urinalysis w microscopic + reflex cultur  Coordination of complex care Assessment & Plan: 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 Dry needling and massage therapy are promising non-invasive options. An occipital nerve block is scheduled, but dry needling is prioritized due to its less invasive nature. Concerns about potential nerve damage from interventions are addressed, with reassurance about the safety of dry needling. Dry needling may alleviate symptoms such as eye pain, headaches, swallowing difficulties, and esophageal dysmotility. Trying multiple practitioners for dry needling and massage is important due to variability in technique.  Facilitate referral for dry needling and massage therapy. Prioritize dry needling over occipital nerve block. Discuss potential nerve block if dry needling is ineffective. Reassure about the safety of dry needling and its potential benefits.  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 Dry needling and massage therapy are beneficial for managing spasticity and associated symptoms. Previous interventions, including trigger point injections, were too painful. Lidocaine  may be used to reduce discomfort during dry needling. Dry needling may reprocess nerve signaling and alleviate spasm and tension in the neck. Facilitate referral for dry needling and massage therapy. Use lidocaine  to reduce discomfort during dry needling.  Esophageal dysmotility with dysphagia, pharyngoesophageal phase   Esophageal dysmotility with dysphagia persists despite previous interventions. Rabeprazole is used to manage acid reflux and prevent esophageal damage. Surgical intervention (myotomy) is high risk and not recommended unless significant weight loss occurs. Nutritional support through protein supplements is ongoing. The risks of myotomy surgery, including potential worsening of symptoms, were discussed. Managing acid reflux to prevent further esophageal damage is emphasized. Continue Rabeprazole for acid reflux management. Avoid surgical intervention unless significant weight loss occurs. Continue nutritional support with protein supplements. Attend scheduled swallow appointment with ENT.  Nutritional status concern   Nutritional status is a concern due to esophageal dysmotility and dysphagia. Weight gain is achieved through protein supplements. The current diet lacks fruits and vegetables, raising concerns about potential nutrient deficiencies. Maintaining nutritional status through supplements and monitoring is important.  Order a full blood panel to assess nutritional status, including B12 and vitamin D  levels. Continue protein supplements to support weight gain.  Orders: -     Ambulatory referral to Psychology  Chronic pain syndrome -     Ambulatory referral to Physical Therapy -     Ambulatory referral to Psychology  Right hip pain -     Urinalysis w microscopic + reflex cultur

## 2024-05-24 NOTE — Assessment & Plan Note (Signed)
 Will order lab testing to guide management.

## 2024-05-25 DIAGNOSIS — Z7689 Persons encountering health services in other specified circumstances: Secondary | ICD-10-CM | POA: Diagnosis not present

## 2024-05-26 ENCOUNTER — Ambulatory Visit: Admitting: Internal Medicine

## 2024-05-27 NOTE — Progress Notes (Signed)
 This encounter was created in error - please disregard.

## 2024-05-28 ENCOUNTER — Encounter: Payer: Self-pay | Admitting: Internal Medicine

## 2024-05-28 ENCOUNTER — Ambulatory Visit (INDEPENDENT_AMBULATORY_CARE_PROVIDER_SITE_OTHER): Admitting: Internal Medicine

## 2024-05-28 VITALS — BP 108/78 | HR 78 | Temp 98.2°F | Ht 66.0 in | Wt 129.8 lb

## 2024-05-28 DIAGNOSIS — R1314 Dysphagia, pharyngoesophageal phase: Secondary | ICD-10-CM | POA: Diagnosis not present

## 2024-05-28 DIAGNOSIS — K224 Dyskinesia of esophagus: Secondary | ICD-10-CM | POA: Diagnosis not present

## 2024-05-28 DIAGNOSIS — E569 Vitamin deficiency, unspecified: Secondary | ICD-10-CM

## 2024-05-28 DIAGNOSIS — E43 Unspecified severe protein-calorie malnutrition: Secondary | ICD-10-CM | POA: Diagnosis not present

## 2024-05-28 DIAGNOSIS — F0781 Postconcussional syndrome: Secondary | ICD-10-CM

## 2024-05-28 DIAGNOSIS — Z7689 Persons encountering health services in other specified circumstances: Secondary | ICD-10-CM | POA: Diagnosis not present

## 2024-05-28 DIAGNOSIS — M7918 Myalgia, other site: Secondary | ICD-10-CM

## 2024-05-28 MED ORDER — EQL CHILD MULTIVIT/MINERALS 18 MG PO CHEW: 1.0000 | CHEWABLE_TABLET | Freq: Every day | ORAL | 3 refills | Status: AC

## 2024-05-28 NOTE — Patient Instructions (Addendum)
 Cause of elevated co2: Complex oropharyngeal and esophageal dysphagia with malnutrition This can cause chronic vomiting or poor intake, potentially leading to metabolic alkalosis from acid loss.  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  Shortness of breath & mild obstructive sleep apnea: Sleep apnea or hypoventilation can cause CO2 retention leading to respiratory acidosis with compensatory bicarbonate retention.  It was a pleasure seeing you today! Your health and satisfaction are our top priorities.  Johnson Cone, MD  VISIT SUMMARY: Johnson Johnson Perla Mickey., a 33 year old male with esophageal dysmotility disorder, visited for evaluation of lab abnormalities and malnutrition. He relies on protein shakes and 'Boost' drinks for nutrition but finds them expensive and insufficient. Recent lab work showed elevated CO2 levels and low methylmalonic acid, indicating malnutrition. He experiences shortness of breath and is concerned about the financial burden of his dietary regimen. He is seeking further assistance from a dietitian.  YOUR PLAN: -SEVERE MALNUTRITION SECONDARY TO ESOPHAGEAL DYSMOTILITY: Severe malnutrition is confirmed by lab abnormalities, including low methylmalonic acid and BUN, indicating inadequate protein and fluid intake. Esophageal dysmotility is causing difficulty in maintaining adequate nutrition, with a risk of immune deficiency due to bone marrow suppression from malnutrition. You are encouraged to increase your fluid and protein intake and consider a multivitamin with minerals, preferably chewable or gummy. There is a potential need for a feeding tube if your nutritional status does not improve. Dr. Garnette Johnson, a dysmotility specialist, is involved in your care plan. We will refer you to  Dr. Borrow and send him your lab results. A follow-up appointment will be scheduled in a few weeks to reassess your nutritional status and repeat blood work.  -POSTCONCUSSION SYNDROME: Postconcussion syndrome may contribute to breathing abnormalities, potentially affecting CO2 levels. Symptoms include shortness of breath, and ongoing breathing issues may be exacerbated by nutritional deficiencies. We will monitor your symptoms and address any nutritional deficiencies that may be contributing to your condition.  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 You report ongoing pain and spasticity issues. We will continue to monitor your condition and address any nutritional deficiencies that may be contributing to your symptoms.  -NUTRITIONAL STATUS MONITORING (VITAMIN D , B12, TRACE MINERALS): Your vitamin D  level has improved from 24 to 51, indicating adequate supplementation. Your B12 levels are sufficient, but continued supplementation is advised. There is a potential deficiency in trace minerals such as zinc, which could contribute to overall nutritional issues. You are advised to continue your current vitamin D  and B12 supplementation and consider adding a multivitamin with minerals, preferably chewable or gummy.  INSTRUCTIONS: We will refer you to Dr. Garnette Johnson, a dysmotility specialist, and send him your lab results. Please increase your fluid and protein intake and consider a multivitamin with minerals, preferably chewable or gummy. A follow-up appointment will be scheduled in a few weeks to reassess your nutritional status and repeat blood work.  Your Providers PCP: Johnson Johnson MATSU, MD,  [phone]) Referring Provider: Cone Johnson MATSU, MD,  [phone]) Care Team Provider: Charlyne Helling, MD,   [phone]) Care Team Provider: Brien Nicholas Pepper, MD,  [phone]) Care Team Provider: Jefrey Bruckner, MD,  [phone]) Care Team Provider: Leland Rankin Argyle, MD,  [phone]) Care Team Provider: Georgina Ozell LABOR, MD,  [phone]) Care Team Provider: Darlean Ozell  B, MD,  [phone]) Care Team Provider: Case, [place], MD,  [phone]) Care Team Provider: Celine Johnson Sharper, MD,  [phone]) Care Team Provider: Delene Thersia Johnson Care Team Provider: Franchot Arthea Norleen, MD,  [phone]) Care Team Provider: [name], FNP,  [phone]) Care Team Provider: [name], MD,  [phone]) Care Team Provider: Alix Rosina Johnson,  [phone]) Care Team Provider: Avi Johnson, [place],  [phone]) Care Team Provider: [name], MD,  [phone]) Care Team Provider: [name], MD,  [phone])  NEXT STEPS: [x]  Early Intervention: Schedule sooner appointment, call our on-call services, or go to emergency room if there is any significant Increase in pain or discomfort New or worsening symptoms Sudden or severe changes in your health [x]  Flexible Follow-Up: We recommend a No follow-ups on file. for optimal routine care. This allows for progress monitoring and treatment adjustments. [x]  Preventive Care: Schedule your annual preventive care visit! It's typically covered by insurance and helps identify potential health issues early. [x]  Lab & X-ray Appointments: Incomplete tests scheduled today, or call to schedule. X-rays: [facility] (M-F, 8:30am-noon or 1pm-5pm). [x]  Medical Information Release: Sign a release form at front desk to obtain relevant medical information we don't have.  MAKING THE MOST OF OUR FOCUSED 20 MINUTE APPOINTMENTS: [x]   Clearly state your top concerns at the beginning of the visit to focus our discussion [x]   If you anticipate you will need more time, please  inform the front desk during scheduling - we can book multiple appointments in the same week. [x]   If you have transportation problems- use our convenient video appointments or ask about transportation support. [x]   We can get down to business faster if you use MyChart to update information before the visit and submit non-urgent questions before your visit. Thank you for taking the time to provide details through MyChart.  Let our nurse know and she can import this information into your encounter documents.  Arrival and Wait Times: [x]   Arriving on time ensures that everyone receives prompt attention. [x]   Early morning (8a) and afternoon (1p) appointments tend to have shortest wait times. [x]   Unfortunately, we cannot delay appointments for late arrivals or hold slots during phone calls.  Getting Answers and Following Up [x]   Simple Questions & Concerns: For quick questions or basic follow-up after your visit, reach us  at [phone] or MyChart messaging. [x]   Complex Concerns: If your concern is more complex, scheduling an appointment might be best. Discuss this with the staff to find the most suitable option. [x]   Lab & Imaging Results: We'll contact you directly if results are abnormal or you don't use MyChart. Most normal results will be on MyChart within 2-3 business days, with a review message from Dr. Jesus. Haven't heard back in 2 weeks? Need results sooner? Contact us  at [phone]. [x]   Referrals: Our referral coordinator will manage specialist referrals. The specialist's office should contact you within 2 weeks to schedule an appointment. Call us  if you haven't heard from them after 2 weeks.  Staying Connected [x]   MyChart: Activate your MyChart for the fastest way to access results and message us . See the last page of this paperwork for instructions on how to activate.  Bring to Your Next Appointment [x]   Medications: Please bring all your medication bottles to your next  appointment to ensure we have an accurate record of your prescriptions. [x]   Health Diaries: If you're monitoring any health conditions at home, keeping a diary  of your readings can be very helpful for discussions at your next appointment.  Billing [x]   X-ray & Lab Orders: These are billed by separate companies. Contact the invoicing company directly for questions or concerns. [x]   Visit Charges: Discuss any billing inquiries with our administrative services team.  Your Satisfaction Matters [x]   Share Your Experience: We strive for your satisfaction! If you have any complaints, or preferably compliments, please let Dr. Jesus know directly or contact our Practice Administrators, Manuelita Rubin or [employer], by asking at the front desk.   Reviewing Your Records [x]   Review this early draft of your clinical encounter notes below and the final encounter summary tomorrow on MyChart after its been completed.  All orders placed so far are visible here: Severe malnutrition (HCC) -     EQL Child Multivit/Minerals; Chew 1 each by mouth daily at 6 (six) AM.  Dispense: 90 tablet; Refill: 3  Pharyngoesophageal dysphagia  Esophageal dysmotility  Cervical myofascial pain syndrome  Vitamin deficiency  Postconcussion syndrome

## 2024-05-28 NOTE — Assessment & Plan Note (Signed)
 Postconcussion syndrome may contribute to breathing abnormalities, potentially affecting CO2 levels. Symptoms include shortness of breath, and ongoing breathing issues may be exacerbated by nutritional deficiencies.

## 2024-05-28 NOTE — Assessment & Plan Note (Signed)
 Severe malnutrition secondary to esophageal dysmotility   Severe malnutrition is confirmed by lab abnormalities, including low methylmalonic acid and BUN, indicating inadequate protein and fluid intake. Esophageal dysmotility is causing difficulty in maintaining adequate nutrition, with a risk of immune deficiency due to bone marrow suppression from malnutrition. There is a potential need for a feeding tube if nutritional status does not improve. He currently relies on protein shakes and supplements but faces financial strain due to the cost. Dr. Garnette Borrow, a dysmotility specialist, is involved in the care plan. Refer to Dr. Borrow and send him lab results. Encourage increased fluid and protein intake. Consider a multivitamin with minerals, preferably chewable or gummy. Discuss the potential need for a feeding tube if nutritional status does not improve. Schedule a follow-up appointment in a few weeks to reassess nutritional status and repeat blood work.  Nutritional status monitoring (vitamin D , B12, trace minerals)   Vitamin D  level improved from 24 to 51, indicating adequate supplementation. B12 levels are sufficient, but continued supplementation is advised. There is a potential deficiency in trace minerals such as zinc, which could contribute to overall nutritional issues. He is advised to continue current vitamin D  and B12 supplementation and consider adding a multivitamin with minerals, preferably chewable or gummy.

## 2024-05-28 NOTE — Progress Notes (Signed)
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 was discussed and reviewed in detail, including medication safety, potential side effects, and all patient questions. We confirmed understanding and comfort with the plan. Follow-up instructions were established, including contacting the office for any concerns, returning if symptoms worsen, persist, or new symptoms develop, and precautions for potential emergency department visits.

## 2024-05-28 NOTE — Assessment & Plan Note (Signed)
 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 He reports ongoing pain and spasticity issues.

## 2024-05-30 ENCOUNTER — Ambulatory Visit: Admitting: Internal Medicine

## 2024-06-01 DIAGNOSIS — Z7689 Persons encountering health services in other specified circumstances: Secondary | ICD-10-CM | POA: Diagnosis not present

## 2024-06-03 DIAGNOSIS — Z419 Encounter for procedure for purposes other than remedying health state, unspecified: Secondary | ICD-10-CM | POA: Diagnosis not present

## 2024-06-15 DIAGNOSIS — Z7689 Persons encountering health services in other specified circumstances: Secondary | ICD-10-CM | POA: Diagnosis not present

## 2024-06-16 ENCOUNTER — Encounter: Payer: Self-pay | Admitting: Internal Medicine

## 2024-06-16 ENCOUNTER — Telehealth (INDEPENDENT_AMBULATORY_CARE_PROVIDER_SITE_OTHER): Admitting: Internal Medicine

## 2024-06-16 DIAGNOSIS — E43 Unspecified severe protein-calorie malnutrition: Secondary | ICD-10-CM | POA: Diagnosis not present

## 2024-06-16 DIAGNOSIS — M7918 Myalgia, other site: Secondary | ICD-10-CM | POA: Diagnosis not present

## 2024-06-16 DIAGNOSIS — F0781 Postconcussional syndrome: Secondary | ICD-10-CM

## 2024-06-16 DIAGNOSIS — R252 Cramp and spasm: Secondary | ICD-10-CM

## 2024-06-16 DIAGNOSIS — M5030 Other cervical disc degeneration, unspecified cervical region: Secondary | ICD-10-CM

## 2024-06-16 DIAGNOSIS — G4486 Cervicogenic headache: Secondary | ICD-10-CM

## 2024-06-16 DIAGNOSIS — F349 Persistent mood [affective] disorder, unspecified: Secondary | ICD-10-CM

## 2024-06-16 DIAGNOSIS — K224 Dyskinesia of esophagus: Secondary | ICD-10-CM

## 2024-06-16 DIAGNOSIS — S14109S Unspecified injury at unspecified level of cervical spinal cord, sequela: Secondary | ICD-10-CM

## 2024-06-16 MED ORDER — CENTRUM FRESH/FRUITY ADULT PO CHEW: 1.0000 | CHEWABLE_TABLET | Freq: Every day | ORAL | 3 refills | Status: AC

## 2024-06-16 NOTE — Assessment & Plan Note (Signed)
 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 The pain is severe and persistent, affecting the eyes, head, and neck, with muscular and neurological components. Previous treatments have been unsuccessful. Provide a sample of Symbravo, a combination of meloxicam  and rizatriptan, for headache and neck pain management. Refer to [facility] for dry needling, deep tissue massage, and regular physical therapy for the neck and head. Consider a greater occipital nerve block with Dr. Duane if needed.
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
## 2024-06-16 NOTE — Assessment & Plan Note (Signed)
 Postconcussion syndrome (TBI sequelae)   Postconcussion syndrome contributes to cognitive and focus issues. He experiences difficulties related to TBI, including potential cognitive damage and personal struggles. Further evaluation and management of these symptoms are needed. Refer to a psychiatrist for medication management related to TBI sequelae. Refer to a psychologist for therapy and counseling. Consider finding a neuropsychologist for further evaluation of cognitive damage from TBI.

## 2024-06-16 NOTE — Assessment & Plan Note (Signed)
 Esophageal dysmotility with pharyngoesophageal dysphagia   Esophageal dysmotility with pharyngoesophageal dysphagia is managed by Dr. Jess. He experiences difficulty swallowing and nutritional issues. Bethanechol, rabeprazole, and nortriptyline are prescribed. If symptoms do not improve, consider repeating esophageal manometry testing. Explore alternative nutrition methods, such as feeding tubes, if nutrition becomes a significant issue.

## 2024-06-16 NOTE — Patient Instructions (Signed)
 It was a pleasure seeing you today! Your health and satisfaction are our top priorities.  Nicholas Cone, MD  VISIT SUMMARY: During your visit, we discussed your chronic headaches, neck pain, swallowing difficulties, and nutritional concerns. We reviewed your current medications and made adjustments to better manage your symptoms. We also discussed your progress in maintaining your weight and the challenges you face balancing your health with work and school.  YOUR PLAN: -CHRONIC CERVICOGENIC HEADACHE AND NECK PAIN: Your chronic headaches and neck pain are likely due to a previous cervical nerve injury. We will provide you with a sample of Symbravo to help manage the pain. Additionally, you are referred to [facility] for dry needling, deep tissue massage, and regular physical therapy. If needed, we may consider a greater occipital nerve block with Dr. Duane.  -ESOPHAGEAL DYSMOTILITY WITH PHARYNGOESOPHAGEAL DYSPHAGIA: Your difficulty swallowing and esophageal issues are being managed by Dr. Jess. You are prescribed bethanechol, rabeprazole, and nortriptyline. If your symptoms do not improve, we may repeat esophageal manometry testing. Alternative nutrition methods, such as feeding tubes, may be explored if necessary.  -SEVERE PROTEIN-CALORIE MALNUTRITION: Your malnutrition is due to limited dietary intake. Your weight has increased from 113 pounds to 132.4 pounds, which is a positive sign. Continue with vitamin supplementation and ensure you are getting adequate nutrition. We recommend vitamin D  supplementation with calcium  intake and prescribe Centrum brand chewable vitamins for easier ingestion.  -VITAMIN DEFICIENCY: You have a vitamin deficiency that we are addressing through supplementation. Continue taking vitamin D3 with calcium  to ensure proper absorption. We also prescribe Centrum brand chewable vitamins to help with your swallowing difficulties.  -POSTCONCUSSION SYNDROME  (TBI SEQUELAE): Your postconcussion syndrome is contributing to cognitive and focus issues. We will refer you to a psychiatrist for medication management and to a psychologist for therapy and counseling. Additionally, we may find a neuropsychologist to further evaluate any cognitive damage from your TBI.  -MOOD DISORDER, UNSPECIFIED: Your mood-related issues may be linked to your TBI and personal struggles. We suggest psychiatric and psychological support to address these issues. You are referred to a psychiatrist for medication management and to a psychologist for therapy and counseling.  -GENERAL HEALTH MAINTENANCE: Maintain a balanced diet with adequate protein and calorie intake. Ensure regular vitamin supplementation to support your overall health.  INSTRUCTIONS: Please schedule regular follow-up appointments as needed. Pick up your Symbravo sample from the office. Follow up with [facility] for your referral status.  Your Providers PCP: Nicholas Nicholas MATSU, MD,  [phone]) Referring Provider: [name], MD,  [phone]) Care Team Provider: Charlyne Helling, MD,  [phone]) Care Team Provider: Brien Garnette Pepper, MD,  [phone]) Care Team Provider: Jefrey Bruckner, MD,  [phone]) Care Team Provider: Leland Rankin Argyle, MD,  [phone]) Care Team Provider: Georgina Ozell LABOR, MD,  [phone]) Care Team Provider: Darlean Ozell NOVAK, MD,  [phone]) Care Team Provider: Case, [place], MD,  [phone]) Care Team Provider: [name], MD,  [phone]) Care Team Provider: Delene Thersia PARAS Care Team Provider: [name], MD,  [phone]) Care Team Provider: [name], FNP,  [phone]) Care Team Provider: [name], MD,  [phone]) Care Team Provider: [name],  [phone]) Care Team Provider: [name], [place],  [phone]) Care Team Provider: [name], MD,  [phone]) Care Team Provider: [name], MD,  [phone])  NEXT STEPS: [x]  Early Intervention: Schedule sooner appointment, call our on-call services, or go to emergency room if there is any significant  Increase in pain or discomfort New or worsening symptoms Sudden or severe changes in your health [x]  Flexible Follow-Up: We recommend a No follow-ups on file. for optimal routine care. This allows for progress monitoring and treatment adjustments. [x]  Preventive Care: Schedule your annual preventive care visit! It's typically covered by insurance and helps identify potential health issues early. [x]  Lab & X-ray Appointments: Incomplete tests scheduled today, or call to schedule. X-rays: [facility] (M-F, 8:30am-noon or 1pm-5pm). [x]  Medical Information Release: Sign a release form at front desk to obtain relevant medical information we don't have.  MAKING THE MOST OF OUR FOCUSED 20 MINUTE APPOINTMENTS: [x]   Clearly state your top concerns at the beginning of the visit to focus our discussion [x]   If you anticipate you will need more time, please inform the front desk during scheduling - we can book multiple appointments in the same week. [x]   If you have transportation problems- use our convenient video appointments or ask about transportation support. [x]   We can get down to business faster if you use MyChart to update information before the visit and submit non-urgent questions before your visit. Thank you for taking the time to provide details through MyChart.  Let our nurse know and she can import this information into your encounter documents.  Arrival and Wait Times: [x]   Arriving on time ensures that everyone receives prompt attention. [x]   Early morning (8a) and afternoon (1p) appointments tend to have shortest wait times. [x]   Unfortunately, we cannot delay appointments for late arrivals or hold slots during phone calls.  Getting Answers  and Following Up [x]   Simple Questions & Concerns: For quick questions or basic follow-up after your visit, reach us  at [phone] or MyChart messaging. [x]   Complex Concerns: If your concern is more complex, scheduling an appointment might be best. Discuss this with the staff to find the most suitable option. [x]   Lab & Imaging Results: We'll contact you directly if results are abnormal or you don't use MyChart. Most normal results will be on MyChart within 2-3 business days, with a review message from Dr. Jesus. Haven't heard back in 2 weeks? Need results sooner? Contact us  at [phone]. [x]   Referrals: Our referral coordinator will manage specialist referrals. The specialist's office should contact you within 2 weeks to schedule an appointment. Call us  if you haven't heard from them after 2 weeks.  Staying Connected [x]   MyChart: Activate your MyChart for the fastest way to access results and message us . See the last page of this paperwork for instructions on how to activate.  Bring to Your Next Appointment [x]   Medications: Please bring all your medication bottles to your next appointment to ensure we have an accurate record of your prescriptions. [x]   Health Diaries: If you're monitoring any health conditions at home, keeping a diary of your readings can be very helpful for discussions at your next appointment.  Billing [x]   X-ray & Lab Orders: These are billed by separate companies. Contact the invoicing company directly for questions or concerns. [x]   Visit Charges: Discuss any billing inquiries with our administrative services team.  Your Satisfaction Matters [x]   Share Your Experience: We strive for your satisfaction! If you have any complaints, or preferably compliments, please let Dr. Jesus know directly or contact our Practice Administrators, Manuelita Rubin or [employer], by asking at the front desk.   Reviewing Your Records [x]   Review this early draft of your  clinical encounter notes below  and the final encounter summary tomorrow on MyChart after its been completed.  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  Dispense: 90 tablet; Refill: 3  Emotional disorder (HCC) -     Ambulatory referral to Psychiatry -     Ambulatory referral to Psychology  Esophageal dysmotility  Postconcussion syndrome

## 2024-06-19 DIAGNOSIS — Z7689 Persons encountering health services in other specified circumstances: Secondary | ICD-10-CM | POA: Diagnosis not present

## 2024-06-20 DIAGNOSIS — Z7689 Persons encountering health services in other specified circumstances: Secondary | ICD-10-CM | POA: Diagnosis not present

## 2024-06-26 DIAGNOSIS — Z7689 Persons encountering health services in other specified circumstances: Secondary | ICD-10-CM | POA: Diagnosis not present

## 2024-06-30 ENCOUNTER — Ambulatory Visit: Admitting: Internal Medicine

## 2024-07-04 DIAGNOSIS — Z7689 Persons encountering health services in other specified circumstances: Secondary | ICD-10-CM | POA: Diagnosis not present

## 2024-07-04 DIAGNOSIS — Z419 Encounter for procedure for purposes other than remedying health state, unspecified: Secondary | ICD-10-CM | POA: Diagnosis not present

## 2024-07-09 ENCOUNTER — Encounter: Payer: Self-pay | Admitting: Internal Medicine

## 2024-07-16 NOTE — Telephone Encounter (Signed)
 Pt. Phoned in yesterday with complaints same as day of his visit with us  on 09/03/2020 wanting to be advised on what to do and having more pain in head. After addressing this w/Dr. Ethyl, Dr. Ethyl reviewed pt in chart and viewed ct scan of neck. Dr. Ethyl advised me to inform pt to follow up w/nerologist for head and neck pain, nothing else for us  to do as ENT since all checked out ok for us  as ENT. I did advise pt.

## 2024-07-16 NOTE — Telephone Encounter (Signed)
 Pt phoned in today stating that he did not remember our conversation yesterday evening and wanted to know what was said. I informed him again that Dr. Ethyl reviewed his office notes and previous CT scan of neck and did not find anything that he needed to address with patient and that all on our end looks fine and Dr. Ethyl advised that pt see a neuro. [name] stated that he has seen one and he was seen at ED last night and had CT scans done. He is still having head and neck pain and feels he has an airway problem and poping in ears and jaw. I told him for the ear popping we can see him and set up a hearing test to tell what is behind his ear drum that Dr. Ethyl can not see to see what maybe causeing the popping or if it is a hearing loss. 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 He would like to talk w/Dr. Ethyl himself. I wrote a note for Dr. Ethyl and advised pt to call [facility]. That he has seen to review his cervical CT scan for him since [name] has advised that he see them and I will address this all again with Dr. Ethyl when he comes in for clinic. Pt want to speak w/him now but Dr. Ethyl is away in surgery.

## 2024-07-16 NOTE — Telephone Encounter (Signed)
 I called Nicholas Johnson to follow up with Dr. Ethyl speaking with him on Friday 09/10/2020 to see if he had made a hearing test appointment anywhere so we can get him in. He thought we would make a hearing test appointment for him. I informed him that he needed to call member service # on back of his card to find who is in network w/[facility] to get a hearing test. He needs to call us  for an appointment after his hearing test to be able to bring test w/him to our appointment. If he needed a referral to call me and let me know, I will glad to send one if needed.

## 2024-07-16 NOTE — Telephone Encounter (Signed)
 Pt called in wanting an appointment for popping in ears. I asked him if he has gotten a hearing test that he knew he needed to follow up for popping in ears. He said he had one and it was fine. I informed him that we needed to bring it with him to visit, he stated he did not have it, I asked him to call where he got it and have it faxed or him pick it up. He stated he will call back later to schedule.

## 2024-07-16 NOTE — Telephone Encounter (Signed)
 Pt called back before we had a chance to call him. He this time is wanting to make appointment to come back in to be seen for popping in his ear that is Dr. Ethyl area. I told him that when we have popping in the ear we need a hearing test and I gave him info on where to get this done with his insurance and he will need to bring test with him. He asked again why to get hearing test, so I explained it to him. He then asked if a [facility] Can see our scans, I told him I was not sure, I did not think so without someone telling [facility] to move them to be viewed at a [facility] office. He then stated he will wait and call the Dr. he has been talking to at [facility] to see what they say. I again advised that Dr. Ethyl has stated in visit with us  for him to see a Neurologist, pt stated that all this is to much running around and did not make appointment with us  at this time.

## 2024-07-18 ENCOUNTER — Ambulatory Visit: Admitting: Internal Medicine

## 2024-07-20 ENCOUNTER — Emergency Department (HOSPITAL_COMMUNITY)
Admission: EM | Admit: 2024-07-20 | Discharge: 2024-07-20 | Disposition: A | Discharge status: Home / Self Care | Attending: Emergency Medicine | Admitting: Emergency Medicine

## 2024-07-20 ENCOUNTER — Other Ambulatory Visit: Payer: Self-pay

## 2024-07-20 ENCOUNTER — Emergency Department (HOSPITAL_COMMUNITY)

## 2024-07-20 DIAGNOSIS — Z91010 Allergy to peanuts: Secondary | ICD-10-CM | POA: Insufficient documentation

## 2024-07-20 DIAGNOSIS — R519 Headache, unspecified: Secondary | ICD-10-CM | POA: Insufficient documentation

## 2024-07-20 DIAGNOSIS — M542 Cervicalgia: Secondary | ICD-10-CM | POA: Insufficient documentation

## 2024-07-20 DIAGNOSIS — M791 Myalgia, unspecified site: Secondary | ICD-10-CM | POA: Insufficient documentation

## 2024-07-20 DIAGNOSIS — R52 Pain, unspecified: Secondary | ICD-10-CM

## 2024-07-20 LAB — COMPREHENSIVE METABOLIC PANEL WITH GFR
ALT: 24 U/L (ref 0–44)
AST: 33 U/L (ref 15–41)
Albumin: 4.3 g/dL (ref 3.5–5.0)
Alkaline Phosphatase: 73 U/L (ref 38–126)
Anion gap: 11 (ref 5–15)
BUN: 13 mg/dL (ref 6–20)
CO2: 31 mmol/L (ref 22–32)
Calcium: 9.7 mg/dL (ref 8.9–10.3)
Chloride: 99 mmol/L (ref 98–111)
Creatinine, Ser: 1.02 mg/dL (ref 0.61–1.24)
GFR, Estimated: >60 mL/min (ref >60)
Glucose, Bld: 89 mg/dL (ref 70–99)
Potassium: 3.8 mmol/L (ref 3.5–5.1)
Sodium: 141 mmol/L (ref 135–145)
Total Bilirubin: 0.5 mg/dL (ref 0.0–1.2)
Total Protein: 7.7 g/dL (ref 6.5–8.1)

## 2024-07-20 LAB — CBC WITH DIFFERENTIAL/PLATELET
Abs Immature Granulocytes: 0 K/uL (ref 0.00–0.07)
Basophils Absolute: 0 K/uL (ref 0.0–0.1)
Basophils Relative: 1 %
Eosinophils Absolute: 0 K/uL (ref 0.0–0.5)
Eosinophils Relative: 1 %
HCT: 45.4 % (ref 39.0–52.0)
Hemoglobin: 15.3 g/dL (ref 13.0–17.0)
Immature Granulocytes: 0 %
Lymphocytes Relative: 29 %
Lymphs Abs: 1.3 K/uL (ref 0.7–4.0)
MCH: 29.5 pg (ref 26.0–34.0)
MCHC: 33.7 g/dL (ref 30.0–36.0)
MCV: 87.6 fL (ref 80.0–100.0)
Monocytes Absolute: 0.5 K/uL (ref 0.1–1.0)
Monocytes Relative: 11 %
Neutro Abs: 2.6 K/uL (ref 1.7–7.7)
Neutrophils Relative %: 58 %
Platelets: 171 K/uL (ref 150–400)
RBC: 5.18 MIL/uL (ref 4.22–5.81)
RDW: 11.8 % (ref 11.5–15.5)
WBC: 4.4 K/uL (ref 4.0–10.5)
nRBC: 0 % (ref 0.0–0.2)

## 2024-07-20 LAB — MAGNESIUM: Magnesium: 2.1 mg/dL (ref 1.7–2.4)

## 2024-07-20 MED ORDER — IOHEXOL 350 MG/ML SOLN
75.0000 mL | Freq: Once | INTRAVENOUS | Status: AC | PRN
  Administered 2024-07-20: 75 mL via INTRAVENOUS

## 2024-07-20 MED ORDER — PROCHLORPERAZINE EDISYLATE 10 MG/2ML IJ SOLN: 10.0000 mg | Freq: Once | INTRAMUSCULAR | Status: DC

## 2024-07-20 MED ORDER — METHOCARBAMOL 500 MG PO TABS
1000.0000 mg | ORAL_TABLET | Freq: Once | ORAL | Status: AC
  Administered 2024-07-20: 1000 mg via ORAL
  Filled 2024-07-20: qty 2

## 2024-07-20 MED ORDER — KETOROLAC TROMETHAMINE 15 MG/ML IJ SOLN
15.0000 mg | Freq: Once | INTRAMUSCULAR | Status: AC
  Administered 2024-07-20: 15 mg via INTRAVENOUS
  Filled 2024-07-20: qty 1

## 2024-07-20 MED ORDER — LACTATED RINGERS IV BOLUS
1000.0000 mL | Freq: Once | INTRAVENOUS | Status: AC
  Administered 2024-07-20: 1000 mL via INTRAVENOUS
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
## 2024-07-20 NOTE — ED Notes (Signed)
 Paper work reviewed with pt. No major concerns at this time. Pt is being wheeled to lobby in wheelchair to await pick up from pts friend.No new onset distress noted at this time.

## 2024-07-20 NOTE — Discharge Instructions (Addendum)
 Your test results today are reassuring.  Continue to follow-up with your outpatient providers.  Discuss treatment and prevention of possible complex migraines with your neurologist.  Return to the emergency department for any new or worsening symptoms of concern.

## 2024-07-20 NOTE — Progress Notes (Signed)
 Provider assessing pt, RN triage questions will resume on completion.
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
## 2024-07-23 ENCOUNTER — Encounter: Payer: Self-pay | Admitting: Internal Medicine

## 2024-07-23 ENCOUNTER — Ambulatory Visit (INDEPENDENT_AMBULATORY_CARE_PROVIDER_SITE_OTHER): Admitting: Internal Medicine

## 2024-07-23 VITALS — BP 100/64 | HR 68 | Temp 98.0°F | Ht 67.0 in | Wt 136.6 lb

## 2024-07-23 DIAGNOSIS — R2990 Unspecified symptoms and signs involving the nervous system: Secondary | ICD-10-CM

## 2024-07-23 DIAGNOSIS — G4486 Cervicogenic headache: Secondary | ICD-10-CM | POA: Diagnosis not present

## 2024-07-23 DIAGNOSIS — K224 Dyskinesia of esophagus: Secondary | ICD-10-CM

## 2024-07-23 DIAGNOSIS — M24159 Other articular cartilage disorders, unspecified hip: Secondary | ICD-10-CM

## 2024-07-23 DIAGNOSIS — F349 Persistent mood [affective] disorder, unspecified: Secondary | ICD-10-CM

## 2024-07-23 DIAGNOSIS — R4189 Other symptoms and signs involving cognitive functions and awareness: Secondary | ICD-10-CM

## 2024-07-23 DIAGNOSIS — Z7689 Persons encountering health services in other specified circumstances: Secondary | ICD-10-CM | POA: Diagnosis not present

## 2024-07-23 DIAGNOSIS — G43109 Migraine with aura, not intractable, without status migrainosus: Secondary | ICD-10-CM | POA: Diagnosis not present

## 2024-07-23 DIAGNOSIS — M7918 Myalgia, other site: Secondary | ICD-10-CM

## 2024-07-23 DIAGNOSIS — G44321 Chronic post-traumatic headache, intractable: Secondary | ICD-10-CM

## 2024-07-23 DIAGNOSIS — M25551 Pain in right hip: Secondary | ICD-10-CM

## 2024-07-23 MED ORDER — NURTEC 75 MG PO TBDP: 1.0000 | ORAL_TABLET | Freq: Every day | ORAL | 11 refills | Status: DC

## 2024-07-23 NOTE — Assessment & Plan Note (Signed)
 Cervical Myofascial Pain Syndrome  Summary: Chronic neck pain and spasm with history of cervical spondylosis/facet arthropathy; imaging shows no acute abnormality. Plan: Physical therapy: Continue dry needling and manual therapy. Interventional: Consider cervical nerve blocks or trigger point injections if refractory. Medications: NSAIDs, muscle relaxants as indicated.

## 2024-07-23 NOTE — Assessment & Plan Note (Signed)
 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 No acute intracranial abnormality on recent CT/CTA. Plan: Abortive therapy: Continue/prescribe Nurtec (rimegepant) for acute migraine management. Specialist referral: Discuss possible CSF leak with neurology; consider additional imaging (MRI brain with contrast, CSF leak protocol) if symptoms persist or worsen. Supportive care: Continue non-pharmacologic migraine strategies (hydration, sleep hygiene, trigger avoidance).
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
## 2024-07-23 NOTE — Assessment & Plan Note (Signed)
   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 Plan: Medication: Start nortriptyline (low dose) for neuromodulation of esophageal motility. Continue prucalopride and current regimen. Nutrition: Monitor weight and nutritional status; consider referral to GI or nutrition if further decline.

## 2024-07-23 NOTE — Assessment & Plan Note (Signed)
 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 No evidence of tendon tear, detachment, or significant chondral or osseous abnormality. Mild pubic symphysis degenerative changes also noted. Previous right labral repair intact. Clinical Significance: Anserine tendinosis refers to chronic degenerative changes (tendinopathy) at the origin of the pes anserinus tendons (sartorius, gracilis, semitendinosus), but in the hip MRI, proximal likely means the hamstring origin at the ischium. This can cause medial thigh/hamstring pain, especially with activity, and may be associated with focal tenderness, pain with resisted flexion, or stretching. Plan: Pain management referral: Consider ultrasound-guided corticosteroid injection at the site of tendinopathy if pain is moderate-to-severe and limiting function. Physical therapy: Initiate PT focused on eccentric strengthening of the hamstring and thigh muscles, flexibility, and gradual return to activity. Activity modification: Advise rest from aggravating activities, ice/heat as tolerated, and NSAIDs if not contraindicated. Follow-up: Reassess in 6-8 weeks for response to conservative management; consider further intervention (PRP, surgical consult) if refractory.

## 2024-07-23 NOTE — Patient Instructions (Addendum)
 [email or web address]    It was a pleasure seeing you today! Your health and satisfaction are our top priorities.  Bernardino Cone, MD  VISIT SUMMARY: During your visit, we discussed your persistent leg and hip pain, which has been ongoing despite previous surgery. We reviewed your MRI results and discussed various symptoms you are experiencing, including headaches, neurological symptoms, cognitive impairment, and esophageal dysmotility. We have outlined a plan to address each of these issues.  YOUR PLAN: -ANSERINE TENDINOSIS OF THIGH: Anserine tendinosis is inflammation of the tendons in the thigh area, causing pain and discomfort. We will refer you to pain management for a steroid injection at the anserine tendon and advise you to rest and reduce activity to allow the inflammation to subside. Physical therapy may also be considered for rehabilitation.  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 We have prescribed Nurtec for migraine management and will discuss the possibility of a cerebrospinal fluid (CSF) leak with a neurologist for further evaluation.  -NEUROLOGICAL SYMPTOMS (NUMBNESS, FACIAL PARESTHESIA, DIZZINESS): These symptoms may be related to cervical issues or post-concussive syndrome. We have ruled out a stroke with recent imaging. You will follow up with a neurologist for further evaluation, and we may consider an MRI of the brain if symptoms persist.  -COGNITIVE IMPAIRMENT AND NEUROPSYCHIATRIC SYMPTOMS POST-TBI: These symptoms, including cognitive difficulties and altered libido, may be related to a traumatic brain injury (TBI). We will refer you to neuropsychology for a comprehensive evaluation and to psychiatry for management of emotional and cognitive symptoms.  -CERVICAL MYOFASCIAL PAIN SYNDROME: This condition  involves chronic neck pain due to muscle spasms and possible nerve involvement following trauma. We recommend continuing dry needling therapy and considering nerve blocks as suggested by specialists.  -DYSMOTILITY OF ESOPHAGUS: This condition involves difficulty in the movement of the esophagus, causing swallowing issues. We will start you on nortriptyline to address nerve signaling and dysmotility, and you should continue your current medication regimen.  INSTRUCTIONS: Please follow up with pain management for the steroid injection, and schedule appointments with neurology and neuropsychology as discussed. Continue your current medications and start nortriptyline as prescribed. Rest and reduce physical activity to help with the inflammation in your thigh.  Your Providers PCP: Cone Bernardino MATSU, MD,  [phone]) Referring Provider: Cone Bernardino MATSU, MD,  [phone]) Care Team Provider: Charlyne Helling, MD,  [phone]) Care Team Provider: Brien Garnette Pepper, MD,  [phone]) Care Team Provider: Jefrey Bruckner, MD,  [phone]) Care Team Provider: Leland Rankin Argyle, MD,  [phone]) Care Team Provider: Georgina Ozell LABOR, MD,  [phone]) Care Team Provider: Darlean Ozell NOVAK, MD,  [phone]) Care Team Provider: Case, [place], MD,  [phone]) Care Team Provider: [name], MD,  [phone]) Care Team Provider: Delene Thersia PARAS Care Team Provider: [name], MD,  [phone]) Care Team Provider: [name], FNP,  [phone]) Care Team Provider: [name], MD,  [phone]) Care Team Provider: [name],  [phone]) Care Team Provider: [name], [place],  [phone]) Care Team Provider: [name], MD,  [phone]) Care Team Provider: [name], MD,  [phone])  NEXT STEPS: [x]  Early Intervention: Schedule sooner appointment, call our on-call services, or go  to emergency room if there is any significant Increase in pain or discomfort New or worsening symptoms Sudden or severe changes in your health [x]  Flexible Follow-Up: We recommend a No follow-ups on file. for optimal routine care. This allows  for progress monitoring and treatment adjustments. [x]  Preventive Care: Schedule your annual preventive care visit! It's typically covered by insurance and helps identify potential health issues early. [x]  Lab & X-ray Appointments: Incomplete tests scheduled today, or call to schedule. X-rays: [facility] (M-F, 8:30am-noon or 1pm-5pm). [x]  Medical Information Release: Sign a release form at front desk to obtain relevant medical information we don't have.  MAKING THE MOST OF OUR FOCUSED 20 MINUTE APPOINTMENTS: [x]   Clearly state your top concerns at the beginning of the visit to focus our discussion [x]   If you anticipate you will need more time, please inform the front desk during scheduling - we can book multiple appointments in the same week. [x]   If you have transportation problems- use our convenient video appointments or ask about transportation support. [x]   We can get down to business faster if you use MyChart to update information before the visit and submit non-urgent questions before your visit. Thank you for taking the time to provide details through MyChart.  Let our nurse know and she can import this information into your encounter documents.  Arrival and Wait Times: [x]   Arriving on time ensures that everyone receives prompt attention. [x]   Early morning (8a) and afternoon (1p) appointments tend to have shortest wait times. [x]   Unfortunately, we cannot delay appointments for late arrivals or hold slots during phone calls.  Getting Answers and Following Up [x]   Simple Questions & Concerns: For quick questions or basic follow-up after your visit, reach us  at [phone] or MyChart messaging. [x]   Complex Concerns: If your  concern is more complex, scheduling an appointment might be best. Discuss this with the staff to find the most suitable option. [x]   Lab & Imaging Results: We'll contact you directly if results are abnormal or you don't use MyChart. Most normal results will be on MyChart within 2-3 business days, with a review message from Dr. Jesus. Haven't heard back in 2 weeks? Need results sooner? Contact us  at [phone]. [x]   Referrals: Our referral coordinator will manage specialist referrals. The specialist's office should contact you within 2 weeks to schedule an appointment. Call us  if you haven't heard from them after 2 weeks.  Staying Connected [x]   MyChart: Activate your MyChart for the fastest way to access results and message us . See the last page of this paperwork for instructions on how to activate.  Bring to Your Next Appointment [x]   Medications: Please bring all your medication bottles to your next appointment to ensure we have an accurate record of your prescriptions. [x]   Health Diaries: If you're monitoring any health conditions at home, keeping a diary of your readings can be very helpful for discussions at your next appointment.  Billing [x]   X-ray & Lab Orders: These are billed by separate companies. Contact the invoicing company directly for questions or concerns. [x]   Visit Charges: Discuss any billing inquiries with our administrative services team.  Your Satisfaction Matters [x]   Share Your Experience: We strive for your satisfaction! If you have any complaints, or preferably compliments, please let Dr. Jesus know directly or contact our Practice Administrators, Manuelita Rubin or [employer], by asking at the front desk.   Reviewing Your Records [x]   Review this early draft of your clinical encounter notes below and the final encounter summary tomorrow on MyChart after its been completed.  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

## 2024-07-28 ENCOUNTER — Telehealth: Payer: Self-pay

## 2024-07-28 NOTE — Telephone Encounter (Signed)
 Copied from CRM [id number]. Topic: General - Transportation >> Jul 28, 2024 11:09 AM Alfonso HERO wrote: Reason for CRM: Patient calling to confirm that office received Transportation request form from [facility]. This needs to be signed and sent back ASAP so the patient can have transportation to his future appts outside [place].  We have not received any form to sign mychart message pt just now

## 2024-07-29 DIAGNOSIS — Z7689 Persons encountering health services in other specified circumstances: Secondary | ICD-10-CM | POA: Diagnosis not present

## 2024-07-31 DIAGNOSIS — Z7689 Persons encountering health services in other specified circumstances: Secondary | ICD-10-CM | POA: Diagnosis not present

## 2024-08-04 ENCOUNTER — Ambulatory Visit: Admitting: Internal Medicine

## 2024-08-06 ENCOUNTER — Telehealth: Payer: Self-pay | Admitting: Internal Medicine

## 2024-08-06 ENCOUNTER — Ambulatory Visit: Admitting: Internal Medicine

## 2024-08-06 ENCOUNTER — Encounter: Payer: Self-pay | Admitting: Internal Medicine

## 2024-08-06 VITALS — BP 122/72 | HR 64 | Temp 98.6°F | Ht 67.0 in | Wt 137.4 lb

## 2024-08-06 DIAGNOSIS — Z7189 Other specified counseling: Secondary | ICD-10-CM | POA: Diagnosis not present

## 2024-08-06 DIAGNOSIS — Z7689 Persons encountering health services in other specified circumstances: Secondary | ICD-10-CM | POA: Diagnosis not present

## 2024-08-06 DIAGNOSIS — G4486 Cervicogenic headache: Secondary | ICD-10-CM

## 2024-08-06 DIAGNOSIS — F0781 Postconcussional syndrome: Secondary | ICD-10-CM | POA: Diagnosis not present

## 2024-08-06 NOTE — Assessment & Plan Note (Addendum)
 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 MRI mostly rules out significant intracranial pathology. Current management includes nerve blocks, physical therapy, and medication trials. Nortriptyline was started at 10 mg with no effect; advised to double the dose. Common side effects include dry mouth, sedation, and weight gain. Dry needling and Medrol Dosepak have not provided significant relief. Vision therapy and neuro-optometry evaluation are ongoing, with potential prism  glasses being considered. Gabapentin  was not well-tolerated due to dizziness. Investigational treatments like transcranial magnetic stimulation were mentioned but not pursued at this time. Increase nortriptyline dose as advised. Proceed with scheduled CT myelogram for CSF leak evaluation. Continue physical therapy and dry needling. Attend scheduled vision therapy and neuro-optometry evaluation. Consider referral to [facility] for post-concussion management. Discuss Botox injections with neurologist. Contact [facility] for psychiatric referral follow-up. Ensure Medicaid transportation form is processed.   Very complex headache(s) syndrome being managed by top tier headache(s) specialists already.  We reviewed the individualized data for him from [employer] as follows:  For this patient with chronic daily post-traumatic headache, most consistent with cervicogenic headache, who has not responded to nortriptyline, dry needling, or a Medrol dose pack, and is awaiting vision therapy and a planned occipital nerve block, several additional evidence-based management strategies can be considered while awaiting further interventions.  Pharmacologic options: Alternative preventive medications include amitriptyline  (if not previously tried), gabapentin ,  propranolol, topiramate, valproate, and SNRIs such as venlafaxine. OnabotulinumtoxinA has shown benefit in small studies for post-traumatic and cervicogenic headache, though the evidence is limited and quality is low.[1][2][3][4][5] CGRP monoclonal antibodies (e.g., erenumab ) have some emerging evidence in post-traumatic headache, but their role in cervicogenic headache is not well established.[1]  Interventional and neuromodulation therapies: Occipital nerve blocks are supported for short-term relief and diagnostic clarification; pulsed radiofrequency and occipital nerve stimulation may be considered for refractory cases.[6][7][8][9] Non-invasive neuromodulation techniques, such as transcranial magnetic stimulation and vagal nerve stimulation, are promising but require further validation.[4][10][5]  Physical therapy and manual interventions: Spinal manipulation, manual therapy, and neck-specific exercises are effective, especially when combined with dry needling or exercise. Multimodal physical therapy approaches--including manipulation, craniocervical exercises, and postural correction--are recommended for cervicogenic headache and headaches associated with neck pain.[11][12][13][5][14][15][16] If dry needling alone was not effective, combining it with manipulation or exercise may yield better results.  Behavioral and non-pharmacologic strategies: Cognitive behavioral therapy, relaxation training, biofeedback, and mindfulness-based therapies are beneficial for headache management and comorbid symptoms such as mood disturbance and rumination.[3][10][17][18] These can be initiated while awaiting further interventions and may improve headache-related disability and coping.  Safety and monitoring: Occipital nerve blocks are generally safe, with minor adverse events reported; serious complications are rare. They can be combined with other pharmacologic and non-pharmacologic therapies with minimal risk of  interaction.[8][9][15][16]  Further evidence needed: Long-term efficacy and optimal combinations of these therapies remain uncertain, and ongoing reassessment is important to tailor management to patient response.[4][13][5]  In summary, multimodal management--including alternative preventive medications, manual therapy, combined physical therapy approaches, behavioral interventions, and safe use of occipital nerve blocks--should be pursued while awaiting further interventions.   Would you like me to review the latest evidence comparing the efficacy of alternative preventive medications--such as gabapentin , SNRIs, or onabotulinumtoxinA--in cervicogenic and post-traumatic headache, to help prioritize which pharmacologic option to trial next? References Treatment Options for Posttraumatic Headache: A Current Review of the Literature. Minen MT, Romelle LOISE Fernand JULIANNA,  et al. Current Pain and Headache Reports. 2024;28(4):205-210. doi:10.1007/s11916-023-01199-y. Diagnosis and Management of Headache: A Review. Robbins MS. JAMA. 2021;325(18):1874-1885. doi:10.1001/jama.7978.8359. Chronic Daily Headache: Diagnosis and Management. Antonetta RADDLE, [place], Koren KG. American Family Physician. 2014;89(8):642-8. Management of Headache (2023). Slater Saucier PhD DNP MSN/Ed PMHCNS PMHNP-BC RN, Mylinda HERO. Antonovich PharmD BCPS, Prentice BROCKS. Buelt DO, et al. Department of [facility]. Efficacy of Physiotherapy Interventions for the Management of Adults With Cervicogenic Headache: A Systematic Review and Meta-Analyses. Demont A, Lafrance S, Gaska C, et al. PM & R : The Journal of Injury, Function, and Rehabilitation. 2023;15(5):613-628. doi:10.1002/pmrj.87143. 11. Cervicogenic Headache and Occipital Neuralgia. Lefel N, van Suijlekom H, Cohen SPC, Kallewaard JW, Fleeta Laruth ALF Pain Practice : The Official Journal of [employer] of Pain. 2025;25(1):e13405. doi:10.1111/papr.13405. Occipital Neuralgia and Cervicogenic  Headache: Diagnosis and Management. Laroy JONELLE Agustin MICAEL Current Neurology and Neuroscience Reports. 2019;19(5):20. doi:10.1007/s11910-(563) 499-0792-8. Efficacy and Safety of Greater Occipital Nerve Block for the Treatment of Cervicogenic Headache: A Systematic Review. Caponnetto LULLA Chaya JONELLE Lockie I, et al. Expert Review of Neurotherapeutics. 2021;21(5):591-597. doi:10.1080/14737175.2021.1903320. Narrative Review of Peripheral Nerve Blocks for the Management of Headache. Unice ESPY, Stearns CC, Kissoon NR, Magnolia CE. Headache. 2022;62(9):1077-1092. doi:10.1111/head.U3130837. Non-Pharmacological Approaches to Headaches: Non-Invasive Neuromodulation, Nutraceuticals, and Behavioral Approaches. Grazzi L, Toppo C, D'Amico D, et al. Public librarian. 2021;18(4):1503. doi:10.3390/ijerph18041503. Physical Therapist Interventions to Reduce Headache Intensity, Frequency, and Duration in Patients With Cervicogenic Headache: A Systematic Review and Network Meta-Analysis. Kara DELENA Charlynn ELINORE Colby TM, et al. Physical Therapy. 2024;104(2):pzad154. doi:10.1093/ptj/pzad154. Spinal Manipulation and Perineural Electrical Dry Needling in Patients With Cervicogenic Headache: A Multicenter Randomized Clinical Trial. Dunning J, Butts R, Zacharko LOISE, et al. The Spine Journal : Official Journal of the [employer]. 2021;21(2):284-295. doi:10.1016/j.spinee.2020.10.008. Non-Invasive Physical Treatments for Chronic/Recurrent Headache. Bronfort G, Nilsson N, Haas M, et al. The Cochrane Database of Systematic Reviews. 2004;(3):CD001878. doi:10.1002/14651858.RI998121.ela7. Comparative Effectiveness of Cervical vs Thoracic Spinal-Thrust Manipulation for Care of Cervicogenic Headache: A Randomized Controlled Trial. Lark MATSU, Alghadier M, Eltayeb MM, et al. PloS One. 2024;19(3):e0300737. doi:10.1371/journal.pone.9699262. Are Non-Invasive Interventions Effective for the  Management of Headaches Associated With Neck Pain? An Update of the Bone and Joint Decade Task Force on Neck Pain and Its Associated Disorders by the [place] Protocol for Traffic Injury Management (OPTIMa) Collaboration. Varatharajan S, Ferguson B, Chrobak K, et al. European Spine Journal : Official Publication of the European Spine Society, the European Spinal Deformity Society, and the European Section of the Cervical Spine Research Society. 2016;25(7):1971-99. doi:10.1007/s00586-303 532 1285-9. Cervicogenic Headache. Jull G. Musculoskeletal Science & Practice. 7976;33:897212. doi:10.1016/j.msksp.2023.102787. An Updated Brief Overview on Post-Traumatic Headache and a Systematic Review of the Non-Pharmacological Interventions for Its Management. Argyriou AA, Mitsikostas DD, Mantovani E, et al. Expert Review of Neurotherapeutics. 2021;21(4):475-490. doi:10.1080/14737175.2021.1900734. Cognitive Behavioral Therapy for Veterans With Comorbid Posttraumatic Headache and Posttraumatic Stress Disorder Symptoms: A Randomized Clinical Trial. Leopold DD, Resick PA, Penzien DB, et al. JAMA Neurology. 2022;79(8):746-757. doi:10.1001/jamaneurol.2022.1567.
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
## 2024-08-06 NOTE — Telephone Encounter (Signed)
 Patient dropped off document distance verification form, to be filled out by provider. Patient requested to send it back via Fax within ASAP. Document is located in providers tray at front office.Please advise at [phone].

## 2024-08-06 NOTE — Patient Instructions (Addendum)
 [email or web address]     It was a pleasure seeing you today! Your health and satisfaction are our top priorities.  Bernardino Cone, MD  VISIT SUMMARY: You came in today for a follow-up on your chronic post-traumatic headaches. Despite various treatments, including nerve blocks, physical therapy, and dry needling, your symptoms remain persistent. You recently started taking nortriptyline but have not noticed any improvement. You are scheduled for a CT myelogram and have an upcoming vision therapy appointment. We discussed several aspects of your ongoing treatment and future plans.  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 We will increase your nortriptyline dose to 20 mg before bed. Common side effects include dry mouth, sedation, and weight gain. Continue with your physical therapy and dry needling sessions. You are scheduled for a CT myelogram to check for a CSF leak and have an upcoming vision therapy appointment where you may receive prism  glasses. We also discussed the possibility of Botox injections and a referral to [facility] for further management of your post-concussion symptoms. Additionally, we will follow up with [facility] for a psychiatric referral and ensure your Medicaid transportation form is processed.  INSTRUCTIONS: Please increase your nortriptyline dose to 20 mg before bed. Continue with your scheduled physical therapy and dry needling sessions. Attend your CT myelogram and vision therapy appointments as planned. We will discuss Botox injections and a referral to [facility] at your next visit. Follow up with [facility] for the psychiatric referral and ensure your Medicaid transportation form is processed.  Your Providers PCP: Cone Bernardino MATSU,  MD,  [phone]) Referring Provider: Cone Bernardino MATSU, MD,  [phone]) Care Team Provider: Charlyne Helling, MD,  [phone]) Care Team Provider: Brien Garnette Pepper, MD,  [phone]) Care Team Provider: Jefrey Bruckner, MD,  [phone]) Care Team Provider: Leland Rankin Argyle, MD,  [phone]) Care Team Provider: Georgina Ozell LABOR, MD,  [phone]) Care Team Provider: Darlean Ozell NOVAK, MD,  [phone]) Care Team Provider: Case, [place], MD,  [phone]) Care Team Provider: [name], MD,  [phone]) Care Team Provider: Delene Thersia PARAS Care Team Provider: [name], MD,  [phone]) Care Team Provider: [name], FNP,  [phone]) Care Team Provider: [name], MD,  [phone]) Care Team Provider: [name],  [phone]) Care Team Provider: [name], [place],  [phone]) Care Team Provider: [name], MD,  [phone]) Care Team Provider: [name], MD,  [phone])  NEXT STEPS: [x]  Early Intervention: Schedule sooner appointment, call our on-call services, or go to emergency room if there is any significant Increase in pain or discomfort New or worsening symptoms Sudden or severe changes in your health [x]  Flexible Follow-Up: We recommend a No follow-ups on file. for optimal routine care. This allows for progress monitoring and treatment adjustments. [x]  Preventive Care: Schedule your annual preventive care visit! It's typically covered by insurance and helps identify potential health issues early. [x]  Lab & X-ray Appointments: Incomplete tests scheduled today, or call to schedule. X-rays: [facility] (M-F, 8:30am-noon or 1pm-5pm). [x]  Medical Information Release: Sign a release form at front desk to obtain relevant medical information we don't have.  MAKING THE MOST OF OUR FOCUSED 20 MINUTE APPOINTMENTS: [x]   Clearly state your top  concerns at the beginning of the visit to focus our discussion [x]   If you anticipate you will need more time, please  inform the front desk during scheduling - we can book multiple appointments in the same week. [x]   If you have transportation problems- use our convenient video appointments or ask about transportation support. [x]   We can get down to business faster if you use MyChart to update information before the visit and submit non-urgent questions before your visit. Thank you for taking the time to provide details through MyChart.  Let our nurse know and she can import this information into your encounter documents.  Arrival and Wait Times: [x]   Arriving on time ensures that everyone receives prompt attention. [x]   Early morning (8a) and afternoon (1p) appointments tend to have shortest wait times. [x]   Unfortunately, we cannot delay appointments for late arrivals or hold slots during phone calls.  Getting Answers and Following Up [x]   Simple Questions & Concerns: For quick questions or basic follow-up after your visit, reach us  at [phone] or MyChart messaging. [x]   Complex Concerns: If your concern is more complex, scheduling an appointment might be best. Discuss this with the staff to find the most suitable option. [x]   Lab & Imaging Results: We'll contact you directly if results are abnormal or you don't use MyChart. Most normal results will be on MyChart within 2-3 business days, with a review message from Dr. Jesus. Haven't heard back in 2 weeks? Need results sooner? Contact us  at [phone]. [x]   Referrals: Our referral coordinator will manage specialist referrals. The specialist's office should contact you within 2 weeks to schedule an appointment. Call us  if you haven't heard from them after 2 weeks.  Staying Connected [x]   MyChart: Activate your MyChart for the fastest way to access results and message us . See the last page of this paperwork for instructions on how to  activate.  Bring to Your Next Appointment [x]   Medications: Please bring all your medication bottles to your next appointment to ensure we have an accurate record of your prescriptions. [x]   Health Diaries: If you're monitoring any health conditions at home, keeping a diary of your readings can be very helpful for discussions at your next appointment.  Billing [x]   X-ray & Lab Orders: These are billed by separate companies. Contact the invoicing company directly for questions or concerns. [x]   Visit Charges: Discuss any billing inquiries with our administrative services team.  Your Satisfaction Matters [x]   Share Your Experience: We strive for your satisfaction! If you have any complaints, or preferably compliments, please let Dr. Jesus know directly or contact our Practice Administrators, Manuelita Rubin or [employer], by asking at the front desk.   Reviewing Your Records [x]   Review this early draft of your clinical encounter notes below and the final encounter summary tomorrow on MyChart after its been completed.  All orders placed so far are visible here: Cervicogenic headache Assessment & Plan: Very complex headache(s) syndrome being managed by top tier headache(s) specialists already.  We reviewed the individualized data for him from [employer]Evidence as follows:  For this patient with chronic daily post-traumatic headache, most consistent with cervicogenic headache, who has not responded to nortriptyline, dry needling, or a Medrol dose pack, and is awaiting vision therapy and a planned occipital nerve block, several additional evidence-based management strategies can be considered while awaiting further interventions.  Pharmacologic options: Alternative preventive medications include amitriptyline  (if not previously tried), gabapentin , propranolol, topiramate, valproate, and SNRIs such as venlafaxine. OnabotulinumtoxinA has shown benefit in small studies for post-traumatic and  cervicogenic headache, though the evidence is limited and  quality is low.[1][2][3][4][5] CGRP monoclonal antibodies (e.g., erenumab ) have some emerging evidence in post-traumatic headache, but their role in cervicogenic headache is not well established.[1]  Interventional and neuromodulation therapies: Occipital nerve blocks are supported for short-term relief and diagnostic clarification; pulsed radiofrequency and occipital nerve stimulation may be considered for refractory cases.[6][7][8][9] Non-invasive neuromodulation techniques, such as transcranial magnetic stimulation and vagal nerve stimulation, are promising but require further validation.[4][10][5]  Physical therapy and manual interventions: Spinal manipulation, manual therapy, and neck-specific exercises are effective, especially when combined with dry needling or exercise. Multimodal physical therapy approaches--including manipulation, craniocervical exercises, and postural correction--are recommended for cervicogenic headache and headaches associated with neck pain.[11][12][13][5][14][15][16] If dry needling alone was not effective, combining it with manipulation or exercise may yield better results.  Behavioral and non-pharmacologic strategies: Cognitive behavioral therapy, relaxation training, biofeedback, and mindfulness-based therapies are beneficial for headache management and comorbid symptoms such as mood disturbance and rumination.[3][10][17][18] These can be initiated while awaiting further interventions and may improve headache-related disability and coping.  Safety and monitoring: Occipital nerve blocks are generally safe, with minor adverse events reported; serious complications are rare. They can be combined with other pharmacologic and non-pharmacologic therapies with minimal risk of interaction.[8][9][15][16]  Further evidence needed: Long-term efficacy and optimal combinations of these therapies remain uncertain, and ongoing  reassessment is important to tailor management to patient response.[4][13][5]  In summary, multimodal management--including alternative preventive medications, manual therapy, combined physical therapy approaches, behavioral interventions, and safe use of occipital nerve blocks--should be pursued while awaiting further interventions.   Would you like me to review the latest evidence comparing the efficacy of alternative preventive medications--such as gabapentin , SNRIs, or onabotulinumtoxinA--in cervicogenic and post-traumatic headache, to help prioritize which pharmacologic option to trial next? References Treatment Options for Posttraumatic Headache: A Current Review of the Literature. Minen MT, Romelle LOISE Fernand JULIANNA, et al. Current Pain and Headache Reports. 2024;28(4):205-210. doi:10.1007/s11916-023-01199-y. Diagnosis and Management of Headache: A Review. Robbins MS. JAMA. 2021;325(18):1874-1885. doi:10.1001/jama.7978.8359. Chronic Daily Headache: Diagnosis and Management. Antonetta RADDLE, [place], Koren KG. American Family Physician. 2014;89(8):642-8. Management of Headache (2023). Slater Saucier PhD DNP MSN/Ed PMHCNS PMHNP-BC RN, Mylinda HERO. Antonovich PharmD BCPS, Prentice BROCKS. Buelt DO, et al. Department of [facility]. Efficacy of Physiotherapy Interventions for the Management of Adults With Cervicogenic Headache: A Systematic Review and Meta-Analyses. Demont A, Lafrance S, Gaska C, et al. PM & R : The Journal of Injury, Function, and Rehabilitation. 2023;15(5):613-628. doi:10.1002/pmrj.87143. 11. Cervicogenic Headache and Occipital Neuralgia. Lefel N, van Suijlekom H, Cohen SPC, Kallewaard JW, Fleeta Laruth ALF Pain Practice : The Official Journal of [employer] of Pain. 2025;25(1):e13405. doi:10.1111/papr.13405. Occipital Neuralgia and Cervicogenic Headache: Diagnosis and Management. Laroy JONELLE Agustin MICAEL Current Neurology and Neuroscience Reports. 2019;19(5):20.  doi:10.1007/s11910-810-322-6720-8. Efficacy and Safety of Greater Occipital Nerve Block for the Treatment of Cervicogenic Headache: A Systematic Review. Caponnetto LULLA Chaya JONELLE Lockie I, et al. Expert Review of Neurotherapeutics. 2021;21(5):591-597. doi:10.1080/14737175.2021.1903320. Narrative Review of Peripheral Nerve Blocks for the Management of Headache. Unice ESPY, [place] CC, Kissoon NR, [place] CE. Headache. 2022;62(9):1077-1092. doi:10.1111/head.U3130837. Non-Pharmacological Approaches to Headaches: Non-Invasive Neuromodulation, Nutraceuticals, and Behavioral Approaches. Grazzi L, Toppo C, D'Amico D, et al. Public librarian. 2021;18(4):1503. doi:10.3390/ijerph18041503. Physical Therapist Interventions to Reduce Headache Intensity, Frequency, and Duration in Patients With Cervicogenic Headache: A Systematic Review and Network Meta-Analysis. Kara DELENA Charlynn ELINORE Colby TM, et al. Physical Therapy. 2024;104(2):pzad154. doi:10.1093/ptj/pzad154. Spinal Manipulation and Perineural Electrical Dry Needling in Patients With Cervicogenic Headache: A Multicenter Randomized Clinical  Trial. Dunning J, Butts R, Zacharko N, et al. The Spine Journal : Official Journal of the [employer]. 2021;21(2):284-295. doi:10.1016/j.spinee.2020.10.008. Non-Invasive Physical Treatments for Chronic/Recurrent Headache. Bronfort G, Nilsson N, Haas M, et al. The Cochrane Database of Systematic Reviews. 2004;(3):CD001878. doi:10.1002/14651858.RI998121.ela7. Comparative Effectiveness of Cervical vs Thoracic Spinal-Thrust Manipulation for Care of Cervicogenic Headache: A Randomized Controlled Trial. Lark MATSU, Alghadier M, Eltayeb MM, et al. PloS One. 2024;19(3):e0300737. doi:10.1371/journal.pone.9699262. Are Non-Invasive Interventions Effective for the Management of Headaches Associated With Neck Pain? An Update of the Bone and Joint Decade Task Force on Neck Pain and Its  Associated Disorders by the [place] Protocol for Traffic Injury Management (OPTIMa) Collaboration. Varatharajan S, Ferguson B, Chrobak K, et al. European Spine Journal : Official Publication of the European Spine Society, the European Spinal Deformity Society, and the European Section of the Cervical Spine Research Society. 2016;25(7):1971-99. doi:10.1007/s00586-540-814-1972-9. Cervicogenic Headache. Jull G. Musculoskeletal Science & Practice. 7976;33:897212. doi:10.1016/j.msksp.2023.102787. An Updated Brief Overview on Post-Traumatic Headache and a Systematic Review of the Non-Pharmacological Interventions for Its Management. Argyriou AA, Mitsikostas DD, Mantovani E, et al. Expert Review of Neurotherapeutics. 2021;21(4):475-490. doi:10.1080/14737175.2021.1900734. Cognitive Behavioral Therapy for Veterans With Comorbid Posttraumatic Headache and Posttraumatic Stress Disorder Symptoms: A Randomized Clinical Trial. Leopold DD, Resick PA, Penzien DB, et al. JAMA Neurology. 2022;79(8):746-757. doi:10.1001/jamaneurol.2022.1567.    Postconcussion syndrome Assessment & Plan: Very complex headache(s) syndrome being managed by top tier headache(s) specialists already.  We reviewed the individualized data for him from OpenEvidence as follows:  For this patient with chronic daily post-traumatic headache, most consistent with cervicogenic headache, who has not responded to nortriptyline, dry needling, or a Medrol dose pack, and is awaiting vision therapy and a planned occipital nerve block, several additional evidence-based management strategies can be considered while awaiting further interventions.  Pharmacologic options: Alternative preventive medications include amitriptyline  (if not previously tried), gabapentin , propranolol, topiramate, valproate, and SNRIs such as venlafaxine. OnabotulinumtoxinA has shown benefit in small studies for post-traumatic and cervicogenic headache, though the evidence is limited and  quality is low.[1][2][3][4][5] CGRP monoclonal antibodies (e.g., erenumab ) have some emerging evidence in post-traumatic headache, but their role in cervicogenic headache is not well established.[1]  Interventional and neuromodulation therapies: Occipital nerve blocks are supported for short-term relief and diagnostic clarification; pulsed radiofrequency and occipital nerve stimulation may be considered for refractory cases.[6][7][8][9] Non-invasive neuromodulation techniques, such as transcranial magnetic stimulation and vagal nerve stimulation, are promising but require further validation.[4][10][5]  Physical therapy and manual interventions: Spinal manipulation, manual therapy, and neck-specific exercises are effective, especially when combined with dry needling or exercise. Multimodal physical therapy approaches--including manipulation, craniocervical exercises, and postural correction--are recommended for cervicogenic headache and headaches associated with neck pain.[11][12][13][5][14][15][16] If dry needling alone was not effective, combining it with manipulation or exercise may yield better results.  Behavioral and non-pharmacologic strategies: Cognitive behavioral therapy, relaxation training, biofeedback, and mindfulness-based therapies are beneficial for headache management and comorbid symptoms such as mood disturbance and rumination.[3][10][17][18] These can be initiated while awaiting further interventions and may improve headache-related disability and coping.  Safety and monitoring: Occipital nerve blocks are generally safe, with minor adverse events reported; serious complications are rare. They can be combined with other pharmacologic and non-pharmacologic therapies with minimal risk of interaction.[8][9][15][16]  Further evidence needed: Long-term efficacy and optimal combinations of these therapies remain uncertain, and ongoing reassessment is important to tailor management to patient  response.[4][13][5]  In summary, multimodal management--including alternative preventive medications, manual therapy, combined physical therapy approaches, behavioral interventions, and safe use of occipital  nerve blocks--should be pursued while awaiting further interventions.   Would you like me to review the latest evidence comparing the efficacy of alternative preventive medications--such as gabapentin , SNRIs, or onabotulinumtoxinA--in cervicogenic and post-traumatic headache, to help prioritize which pharmacologic option to trial next? References Treatment Options for Posttraumatic Headache: A Current Review of the Literature. Minen MT, Romelle LOISE Fernand JULIANNA, et al. Current Pain and Headache Reports. 2024;28(4):205-210. doi:10.1007/s11916-023-01199-y. Diagnosis and Management of Headache: A Review. Robbins MS. JAMA. 2021;325(18):1874-1885. doi:10.1001/jama.7978.8359. Chronic Daily Headache: Diagnosis and Management. Antonetta RADDLE, [place], Koren KG. American Family Physician. 2014;89(8):642-8. Management of Headache (2023). Slater Saucier PhD DNP MSN/Ed PMHCNS PMHNP-BC RN, Mylinda HERO. Antonovich PharmD BCPS, Prentice BROCKS. Buelt DO, et al. Department of [facility]. Efficacy of Physiotherapy Interventions for the Management of Adults With Cervicogenic Headache: A Systematic Review and Meta-Analyses. Demont A, Lafrance S, Gaska C, et al. PM & R : The Journal of Injury, Function, and Rehabilitation. 2023;15(5):613-628. doi:10.1002/pmrj.87143. 11. Cervicogenic Headache and Occipital Neuralgia. Lefel N, van Suijlekom H, Cohen SPC, Kallewaard JW, Fleeta Laruth ALF Pain Practice : The Official Journal of [employer] of Pain. 2025;25(1):e13405. doi:10.1111/papr.13405. Occipital Neuralgia and Cervicogenic Headache: Diagnosis and Management. Laroy JONELLE Agustin MICAEL Current Neurology and Neuroscience Reports. 2019;19(5):20. doi:10.1007/s11910-440-772-5241-8. Efficacy and Safety of Greater Occipital Nerve Block for the  Treatment of Cervicogenic Headache: A Systematic Review. Caponnetto LULLA Chaya JONELLE Lockie I, et al. Expert Review of Neurotherapeutics. 2021;21(5):591-597. doi:10.1080/14737175.2021.1903320. Narrative Review of Peripheral Nerve Blocks for the Management of Headache. Unice ESPY, [place] CC, Kissoon NR, [place] CE. Headache. 2022;62(9):1077-1092. doi:10.1111/head.V7042522. Non-Pharmacological Approaches to Headaches: Non-Invasive Neuromodulation, Nutraceuticals, and Behavioral Approaches. Grazzi L, Toppo C, D'Amico D, et al. Public librarian. 2021;18(4):1503. doi:10.3390/ijerph18041503. Physical Therapist Interventions to Reduce Headache Intensity, Frequency, and Duration in Patients With Cervicogenic Headache: A Systematic Review and Network Meta-Analysis. Kara DELENA Charlynn ELINORE Colby TM, et al. Physical Therapy. 2024;104(2):pzad154. doi:10.1093/ptj/pzad154. Spinal Manipulation and Perineural Electrical Dry Needling in Patients With Cervicogenic Headache: A Multicenter Randomized Clinical Trial. Dunning J, Butts R, Zacharko LOISE, et al. The Spine Journal : Official Journal of the [employer]. 2021;21(2):284-295. doi:10.1016/j.spinee.2020.10.008. Non-Invasive Physical Treatments for Chronic/Recurrent Headache. Bronfort G, Nilsson N, Haas M, et al. The Cochrane Database of Systematic Reviews. 2004;(3):CD001878. doi:10.1002/14651858.RI998121.ela7. Comparative Effectiveness of Cervical vs Thoracic Spinal-Thrust Manipulation for Care of Cervicogenic Headache: A Randomized Controlled Trial. Lark MATSU, Alghadier M, Eltayeb MM, et al. PloS One. 2024;19(3):e0300737. doi:10.1371/journal.pone.9699262. Are Non-Invasive Interventions Effective for the Management of Headaches Associated With Neck Pain? An Update of the Bone and Joint Decade Task Force on Neck Pain and Its Associated Disorders by the [place] Protocol for Traffic Injury Management (OPTIMa)  Collaboration. Varatharajan S, Ferguson B, Chrobak K, et al. European Spine Journal : Official Publication of the European Spine Society, the European Spinal Deformity Society, and the European Section of the Cervical Spine Research Society. 2016;25(7):1971-99. doi:10.1007/s00586-(207) 002-3456-9. Cervicogenic Headache. Jull G. Musculoskeletal Science & Practice. 7976;33:897212. doi:10.1016/j.msksp.2023.102787. An Updated Brief Overview on Post-Traumatic Headache and a Systematic Review of the Non-Pharmacological Interventions for Its Management. Argyriou AA, Mitsikostas DD, Mantovani E, et al. Expert Review of Neurotherapeutics. 2021;21(4):475-490. doi:10.1080/14737175.2021.1900734. Cognitive Behavioral Therapy for Veterans With Comorbid Posttraumatic Headache and Posttraumatic Stress Disorder Symptoms: A Randomized Clinical Trial. Leopold DD, Resick PA, Penzien DB, et al. JAMA Neurology. 2022;79(8):746-757. doi:10.1001/jamaneurol.2022.1567.    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  We reviewed the individualized data for him from OpenEvidence  as follows:  For this patient with chronic daily post-traumatic headache, most consistent with cervicogenic headache, who has not responded to nortriptyline, dry needling, or a Medrol dose pack, and is awaiting vision therapy and a planned occipital nerve block, several additional evidence-based management strategies can be considered while awaiting further interventions.  Pharmacologic options: Alternative preventive medications include amitriptyline  (if not previously tried), gabapentin , propranolol, topiramate, valproate, and SNRIs such as venlafaxine. OnabotulinumtoxinA has shown benefit in small studies for post-traumatic and cervicogenic headache, though the evidence is limited and quality is low.[1][2][3][4][5] CGRP monoclonal antibodies (e.g., erenumab ) have  some emerging evidence in post-traumatic headache, but their role in cervicogenic headache is not well established.[1]  Interventional and neuromodulation therapies: Occipital nerve blocks are supported for short-term relief and diagnostic clarification; pulsed radiofrequency and occipital nerve stimulation may be considered for refractory cases.[6][7][8][9] Non-invasive neuromodulation techniques, such as transcranial magnetic stimulation and vagal nerve stimulation, are promising but require further validation.[4][10][5]  Physical therapy and manual interventions: Spinal manipulation, manual therapy, and neck-specific exercises are effective, especially when combined with dry needling or exercise. Multimodal physical therapy approaches--including manipulation, craniocervical exercises, and postural correction--are recommended for cervicogenic headache and headaches associated with neck pain.[11][12][13][5][14][15][16] If dry needling alone was not effective, combining it with manipulation or exercise may yield better results.  Behavioral and non-pharmacologic strategies: Cognitive behavioral therapy, relaxation training, biofeedback, and mindfulness-based therapies are beneficial for headache management and comorbid symptoms such as mood disturbance and rumination.[3][10][17][18] These can be initiated while awaiting further interventions and may improve headache-related disability and coping.  Safety and monitoring: Occipital nerve blocks are generally safe, with minor adverse events reported; serious complications are rare. They can be combined with other pharmacologic and non-pharmacologic therapies with minimal risk of interaction.[8][9][15][16]  Further evidence needed: Long-term efficacy and optimal combinations of these therapies remain uncertain, and ongoing reassessment is important to tailor management to patient response.[4][13][5]  In summary, multimodal management--including alternative  preventive medications, manual therapy, combined physical therapy approaches, behavioral interventions, and safe use of occipital nerve blocks--should be pursued while awaiting further interventions.   Would you like me to review the latest evidence comparing the efficacy of alternative preventive medications--such as gabapentin , SNRIs, or onabotulinumtoxinA--in cervicogenic and post-traumatic headache, to help prioritize which pharmacologic option to trial next? References Treatment Options for Posttraumatic Headache: A Current Review of the Literature. Minen MT, Romelle LOISE Fernand JULIANNA, et al. Current Pain and Headache Reports. 2024;28(4):205-210. doi:10.1007/s11916-023-01199-y. Diagnosis and Management of Headache: A Review. Robbins MS. JAMA. 2021;325(18):1874-1885. doi:10.1001/jama.7978.8359. Chronic Daily Headache: Diagnosis and Management. Antonetta RADDLE, [place], Koren KG. American Family Physician. 2014;89(8):642-8. Management of Headache (2023). Slater Saucier PhD DNP MSN/Ed PMHCNS PMHNP-BC RN, Mylinda HERO. Antonovich PharmD BCPS, Prentice BROCKS. Buelt DO, et al. Department of [facility]. Efficacy of Physiotherapy Interventions for the Management of Adults With Cervicogenic Headache: A Systematic Review and Meta-Analyses. Demont A, Lafrance S, Gaska C, et al. PM & R : The Journal of Injury, Function, and Rehabilitation. 2023;15(5):613-628. doi:10.1002/pmrj.87143. 11. Cervicogenic Headache and Occipital Neuralgia. Lefel N, van Suijlekom H, Cohen SPC, Kallewaard JW, Fleeta Laruth ALF Pain Practice : The Official Journal of [employer] of Pain. 2025;25(1):e13405. doi:10.1111/papr.13405. Occipital Neuralgia and Cervicogenic Headache: Diagnosis and Management. Laroy JONELLE Agustin MICAEL Current Neurology and Neuroscience Reports. 2019;19(5):20. doi:10.1007/s11910-531-054-7201-8. Efficacy and Safety of Greater Occipital Nerve Block for the Treatment of Cervicogenic Headache: A Systematic Review. Caponnetto LULLA Chaya JONELLE Lockie I, et al. Expert Review of Neurotherapeutics. 2021;21(5):591-597. doi:10.1080/14737175.2021.1903320. Narrative Review of Peripheral Nerve Blocks for  the Management of Headache. Unice ESPY, [place] CC, Kissoon NR, Osgood CE. Headache. 2022;62(9):1077-1092. doi:10.1111/head.U3130837. Non-Pharmacological Approaches to Headaches: Non-Invasive Neuromodulation, Nutraceuticals, and Behavioral Approaches. Grazzi L, Toppo C, D'Amico D, et al. Public librarian. 2021;18(4):1503. doi:10.3390/ijerph18041503. Physical Therapist Interventions to Reduce Headache Intensity, Frequency, and Duration in Patients With Cervicogenic Headache: A Systematic Review and Network Meta-Analysis. Kara DELENA Charlynn ELINORE Colby TM, et al. Physical Therapy. 2024;104(2):pzad154. doi:10.1093/ptj/pzad154. Spinal Manipulation and Perineural Electrical Dry Needling in Patients With Cervicogenic Headache: A Multicenter Randomized Clinical Trial. Dunning J, Butts R, Zacharko LOISE, et al. The Spine Journal : Official Journal of the [employer]. 2021;21(2):284-295. doi:10.1016/j.spinee.2020.10.008. Non-Invasive Physical Treatments for Chronic/Recurrent Headache. Bronfort G, Nilsson N, Haas M, et al. The Cochrane Database of Systematic Reviews. 2004;(3):CD001878. doi:10.1002/14651858.RI998121.ela7. Comparative Effectiveness of Cervical vs Thoracic Spinal-Thrust Manipulation for Care of Cervicogenic Headache: A Randomized Controlled Trial. Lark MATSU, Alghadier M, Eltayeb MM, et al. PloS One. 2024;19(3):e0300737. doi:10.1371/journal.pone.9699262. Are Non-Invasive Interventions Effective for the Management of Headaches Associated With Neck Pain? An Update of the Bone and Joint Decade Task Force on Neck Pain and Its Associated Disorders by the [place] Protocol for Traffic Injury Management (OPTIMa) Collaboration. Varatharajan S, Ferguson B, Chrobak K, et al. European Spine Journal :  Official Publication of the European Spine Society, the European Spinal Deformity Society, and the European Section of the Cervical Spine Research Society. 2016;25(7):1971-99. doi:10.1007/s00586-6675226796-9. Cervicogenic Headache. Jull G. Musculoskeletal Science & Practice. 7976;33:897212. doi:10.1016/j.msksp.2023.102787. An Updated Brief Overview on Post-Traumatic Headache and a Systematic Review of the Non-Pharmacological Interventions for Its Management. Argyriou AA, Mitsikostas DD, Mantovani E, et al. Expert Review of Neurotherapeutics. 2021;21(4):475-490. doi:10.1080/14737175.2021.1900734. Cognitive Behavioral Therapy for Veterans With Comorbid Posttraumatic Headache and Posttraumatic Stress Disorder Symptoms: A Randomized Clinical Trial. Leopold DD, Resick PA, Penzien DB, et al. JAMA Neurology. 2022;79(8):746-757. doi:10.1001/jamaneurol.2022.1567.        It was a pleasure seeing you today! Your health and satisfaction are our top priorities.  Bernardino Cone, MD  VISIT SUMMARY: You came in today for a follow-up on your chronic post-traumatic headaches. Despite various treatments, including nerve blocks, physical therapy, and dry needling, your symptoms remain persistent. You recently started taking nortriptyline but have not noticed any improvement. You are scheduled for a CT myelogram and have an upcoming vision therapy appointment. We discussed several aspects of your ongoing treatment and future plans.  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 We will increase your nortriptyline dose to 20 mg before bed. Common side effects include dry mouth, sedation, and weight gain. Continue with your physical therapy and dry needling sessions. You are scheduled for a CT myelogram to check for a CSF leak and have an  upcoming vision therapy appointment where you may receive prism  glasses. We also discussed the possibility of Botox injections and a referral to [facility] for further management of your post-concussion symptoms. Additionally, we will follow up with [facility] for a psychiatric referral and ensure your Medicaid transportation form is processed.  INSTRUCTIONS: Please increase your nortriptyline dose to 20 mg before bed. Continue with your scheduled physical therapy and dry needling sessions. Attend your CT myelogram and vision therapy appointments as planned. We will discuss Botox injections and a referral to [facility] at your next visit. Follow up with [facility] for the psychiatric referral and ensure your Medicaid transportation form is  processed.  Your Providers PCP: Jesus Bernardino MATSU, MD,  [phone]) Referring Provider: Jesus Bernardino MATSU, MD,  [phone]) Care Team Provider: Charlyne Helling, MD,  [phone]) Care Team Provider: Brien Garnette Pepper, MD,  [phone]) Care Team Provider: Jefrey Bruckner, MD,  [phone]) Care Team Provider: Leland Rankin Argyle, MD,  [phone]) Care Team Provider: Georgina Ozell LABOR, MD,  [phone]) Care Team Provider: Darlean Ozell NOVAK, MD,  [phone]) Care Team Provider: Case, [place], MD,  [phone]) Care Team Provider: [name], MD,  [phone]) Care Team Provider: Delene Thersia PARAS Care Team Provider: [name], MD,  [phone]) Care Team Provider: [name], FNP,  [phone]) Care Team Provider: [name], MD,  [phone]) Care Team Provider: [name],  [phone]) Care Team Provider: [name], [place],  [phone]) Care Team Provider: [name], MD,  [phone]) Care Team Provider: [name], MD,  [phone])  NEXT STEPS: [x]  Early Intervention: Schedule sooner appointment, call our on-call services, or  go to emergency room if there is any significant Increase in pain or discomfort New or worsening symptoms Sudden or severe changes in your health [x]  Flexible Follow-Up: We recommend a No follow-ups on file. for optimal routine care. This allows for progress monitoring and treatment adjustments. [x]  Preventive Care: Schedule your annual preventive care visit! It's typically covered by insurance and helps identify potential health issues early. [x]  Lab & X-ray Appointments: Incomplete tests scheduled today, or call to schedule. X-rays: [facility] (M-F, 8:30am-noon or 1pm-5pm). [x]  Medical Information Release: Sign a release form at front desk to obtain relevant medical information we don't have.  MAKING THE MOST OF OUR FOCUSED 20 MINUTE APPOINTMENTS: [x]   Clearly state your top concerns at the beginning of the visit to focus our discussion [x]   If you anticipate you will need more time, please inform the front desk during scheduling - we can book multiple appointments in the same week. [x]   If you have transportation problems- use our convenient video appointments or ask about transportation support. [x]   We can get down to business faster if you use MyChart to update information before the visit and submit non-urgent questions before your visit. Thank you for taking the time to provide details through MyChart.  Let our nurse know and she can import this information into your encounter documents.  Arrival and Wait Times: [x]   Arriving on time ensures that everyone receives prompt attention. [x]   Early morning (8a) and afternoon (1p) appointments tend to have shortest wait times. [x]   Unfortunately, we cannot delay appointments for late arrivals or hold slots during phone calls.  Getting Answers and Following Up [x]   Simple Questions & Concerns: For quick questions or basic follow-up after your visit, reach us  at [phone] or MyChart messaging. [x]   Complex Concerns: If  your concern is more complex, scheduling an appointment might be best. Discuss this with the staff to find the most suitable option. [x]   Lab & Imaging Results: We'll contact you directly if results are abnormal or you don't use MyChart. Most normal results will be on MyChart within 2-3 business days, with a review message from Dr. Jesus. Haven't heard back in 2 weeks? Need results sooner? Contact us  at [phone]. [x]   Referrals: Our referral coordinator will manage specialist referrals. The specialist's office should contact you within 2 weeks to schedule an appointment. Call us  if you haven't heard from them after 2 weeks.  Staying Connected [x]   MyChart: Activate your MyChart  for the fastest way to access results and message us . See the last page of this paperwork for instructions on how to activate.  Bring to Your Next Appointment [x]   Medications: Please bring all your medication bottles to your next appointment to ensure we have an accurate record of your prescriptions. [x]   Health Diaries: If you're monitoring any health conditions at home, keeping a diary of your readings can be very helpful for discussions at your next appointment.  Billing [x]   X-ray & Lab Orders: These are billed by separate companies. Contact the invoicing company directly for questions or concerns. [x]   Visit Charges: Discuss any billing inquiries with our administrative services team.  Your Satisfaction Matters [x]   Share Your Experience: We strive for your satisfaction! If you have any complaints, or preferably compliments, please let Dr. Jesus know directly or contact our Practice Administrators, Manuelita Rubin or [employer], by asking at the front desk.   Reviewing Your Records [x]   Review this early draft of your clinical encounter notes below and the final encounter summary tomorrow on MyChart after its been completed.  All orders placed so far are visible here: Cervicogenic headache Assessment &  Plan: Very complex headache(s) syndrome being managed by top tier headache(s) specialists already.  We reviewed the individualized data for him from OpenEvidence as follows:  For this patient with chronic daily post-traumatic headache, most consistent with cervicogenic headache, who has not responded to nortriptyline, dry needling, or a Medrol dose pack, and is awaiting vision therapy and a planned occipital nerve block, several additional evidence-based management strategies can be considered while awaiting further interventions.  Pharmacologic options: Alternative preventive medications include amitriptyline  (if not previously tried), gabapentin , propranolol, topiramate, valproate, and SNRIs such as venlafaxine. OnabotulinumtoxinA has shown benefit in small studies for post-traumatic and cervicogenic headache, though the evidence is limited and quality is low.[1][2][3][4][5] CGRP monoclonal antibodies (e.g., erenumab ) have some emerging evidence in post-traumatic headache, but their role in cervicogenic headache is not well established.[1]  Interventional and neuromodulation therapies: Occipital nerve blocks are supported for short-term relief and diagnostic clarification; pulsed radiofrequency and occipital nerve stimulation may be considered for refractory cases.[6][7][8][9] Non-invasive neuromodulation techniques, such as transcranial magnetic stimulation and vagal nerve stimulation, are promising but require further validation.[4][10][5]  Physical therapy and manual interventions: Spinal manipulation, manual therapy, and neck-specific exercises are effective, especially when combined with dry needling or exercise. Multimodal physical therapy approaches--including manipulation, craniocervical exercises, and postural correction--are recommended for cervicogenic headache and headaches associated with neck pain.[11][12][13][5][14][15][16] If dry needling alone was not effective, combining it with  manipulation or exercise may yield better results.  Behavioral and non-pharmacologic strategies: Cognitive behavioral therapy, relaxation training, biofeedback, and mindfulness-based therapies are beneficial for headache management and comorbid symptoms such as mood disturbance and rumination.[3][10][17][18] These can be initiated while awaiting further interventions and may improve headache-related disability and coping.  Safety and monitoring: Occipital nerve blocks are generally safe, with minor adverse events reported; serious complications are rare. They can be combined with other pharmacologic and non-pharmacologic therapies with minimal risk of interaction.[8][9][15][16]  Further evidence needed: Long-term efficacy and optimal combinations of these therapies remain uncertain, and ongoing reassessment is important to tailor management to patient response.[4][13][5]  In summary, multimodal management--including alternative preventive medications, manual therapy, combined physical therapy approaches, behavioral interventions, and safe use of occipital nerve blocks--should be pursued while awaiting further interventions.   Would you like me to review the latest evidence comparing the efficacy of alternative preventive medications--such as gabapentin ,  SNRIs, or onabotulinumtoxinA--in cervicogenic and post-traumatic headache, to help prioritize which pharmacologic option to trial next? References Treatment Options for Posttraumatic Headache: A Current Review of the Literature. Minen MT, Romelle LOISE Fernand JULIANNA, et al. Current Pain and Headache Reports. 2024;28(4):205-210. doi:10.1007/s11916-023-01199-y. Diagnosis and Management of Headache: A Review. Robbins MS. JAMA. 2021;325(18):1874-1885. doi:10.1001/jama.7978.8359. Chronic Daily Headache: Diagnosis and Management. Antonetta RADDLE, [place], Koren KG. American Family Physician. 2014;89(8):642-8. Management of Headache (2023). Slater Saucier PhD DNP MSN/Ed  PMHCNS PMHNP-BC RN, Mylinda HERO. Antonovich PharmD BCPS, Prentice BROCKS. Buelt DO, et al. Department of [facility]. Efficacy of Physiotherapy Interventions for the Management of Adults With Cervicogenic Headache: A Systematic Review and Meta-Analyses. Demont A, Lafrance S, Gaska C, et al. PM & R : The Journal of Injury, Function, and Rehabilitation. 2023;15(5):613-628. doi:10.1002/pmrj.87143. 11. Cervicogenic Headache and Occipital Neuralgia. Lefel N, van Suijlekom H, Cohen SPC, Kallewaard JW, Fleeta Laruth ALF Pain Practice : The Official Journal of [employer] of Pain. 2025;25(1):e13405. doi:10.1111/papr.13405. Occipital Neuralgia and Cervicogenic Headache: Diagnosis and Management. Laroy JONELLE Agustin MICAEL Current Neurology and Neuroscience Reports. 2019;19(5):20. doi:10.1007/s11910-(773)611-8491-8. Efficacy and Safety of Greater Occipital Nerve Block for the Treatment of Cervicogenic Headache: A Systematic Review. Caponnetto LULLA Chaya JONELLE Lockie I, et al. Expert Review of Neurotherapeutics. 2021;21(5):591-597. doi:10.1080/14737175.2021.1903320. Narrative Review of Peripheral Nerve Blocks for the Management of Headache. Unice ESPY, [place] CC, Kissoon NR, [place] CE. Headache. 2022;62(9):1077-1092. doi:10.1111/head.V7042522. Non-Pharmacological Approaches to Headaches: Non-Invasive Neuromodulation, Nutraceuticals, and Behavioral Approaches. Grazzi L, Toppo C, D'Amico D, et al. Public librarian. 2021;18(4):1503. doi:10.3390/ijerph18041503. Physical Therapist Interventions to Reduce Headache Intensity, Frequency, and Duration in Patients With Cervicogenic Headache: A Systematic Review and Network Meta-Analysis. Kara DELENA Charlynn ELINORE Colby TM, et al. Physical Therapy. 2024;104(2):pzad154. doi:10.1093/ptj/pzad154. Spinal Manipulation and Perineural Electrical Dry Needling in Patients With Cervicogenic Headache: A Multicenter Randomized Clinical Trial. Dunning J,  Butts R, Zacharko LOISE, et al. The Spine Journal : Official Journal of the [employer]. 2021;21(2):284-295. doi:10.1016/j.spinee.2020.10.008. Non-Invasive Physical Treatments for Chronic/Recurrent Headache. Bronfort G, Nilsson N, Haas M, et al. The Cochrane Database of Systematic Reviews. 2004;(3):CD001878. doi:10.1002/14651858.RI998121.ela7. Comparative Effectiveness of Cervical vs Thoracic Spinal-Thrust Manipulation for Care of Cervicogenic Headache: A Randomized Controlled Trial. Lark MATSU, Alghadier M, Eltayeb MM, et al. PloS One. 2024;19(3):e0300737. doi:10.1371/journal.pone.9699262. Are Non-Invasive Interventions Effective for the Management of Headaches Associated With Neck Pain? An Update of the Bone and Joint Decade Task Force on Neck Pain and Its Associated Disorders by the [place] Protocol for Traffic Injury Management (OPTIMa) Collaboration. Varatharajan S, Ferguson B, Chrobak K, et al. European Spine Journal : Official Publication of the European Spine Society, the European Spinal Deformity Society, and the European Section of the Cervical Spine Research Society. 2016;25(7):1971-99. doi:10.1007/s00586-346-742-8937-9. Cervicogenic Headache. Jull G. Musculoskeletal Science & Practice. 7976;33:897212. doi:10.1016/j.msksp.2023.102787. An Updated Brief Overview on Post-Traumatic Headache and a Systematic Review of the Non-Pharmacological Interventions for Its Management. Argyriou AA, Mitsikostas DD, Mantovani E, et al. Expert Review of Neurotherapeutics. 2021;21(4):475-490. doi:10.1080/14737175.2021.1900734. Cognitive Behavioral Therapy for Veterans With Comorbid Posttraumatic Headache and Posttraumatic Stress Disorder Symptoms: A Randomized Clinical Trial. Leopold DD, Resick PA, Penzien DB, et al. JAMA Neurology. 2022;79(8):746-757. doi:10.1001/jamaneurol.2022.1567.    Postconcussion syndrome Assessment & Plan: Very complex headache(s) syndrome being managed by top tier headache(s)  specialists already.  We reviewed the individualized data for him from OpenEvidence as follows:  For this patient with chronic daily post-traumatic headache, most consistent with cervicogenic headache, who has not responded to nortriptyline, dry needling, or a Medrol dose pack,  and is awaiting vision therapy and a planned occipital nerve block, several additional evidence-based management strategies can be considered while awaiting further interventions.  Pharmacologic options: Alternative preventive medications include amitriptyline  (if not previously tried), gabapentin , propranolol, topiramate, valproate, and SNRIs such as venlafaxine. OnabotulinumtoxinA has shown benefit in small studies for post-traumatic and cervicogenic headache, though the evidence is limited and quality is low.[1][2][3][4][5] CGRP monoclonal antibodies (e.g., erenumab ) have some emerging evidence in post-traumatic headache, but their role in cervicogenic headache is not well established.[1]  Interventional and neuromodulation therapies: Occipital nerve blocks are supported for short-term relief and diagnostic clarification; pulsed radiofrequency and occipital nerve stimulation may be considered for refractory cases.[6][7][8][9] Non-invasive neuromodulation techniques, such as transcranial magnetic stimulation and vagal nerve stimulation, are promising but require further validation.[4][10][5]  Physical therapy and manual interventions: Spinal manipulation, manual therapy, and neck-specific exercises are effective, especially when combined with dry needling or exercise. Multimodal physical therapy approaches--including manipulation, craniocervical exercises, and postural correction--are recommended for cervicogenic headache and headaches associated with neck pain.[11][12][13][5][14][15][16] If dry needling alone was not effective, combining it with manipulation or exercise may yield better results.  Behavioral and non-pharmacologic  strategies: Cognitive behavioral therapy, relaxation training, biofeedback, and mindfulness-based therapies are beneficial for headache management and comorbid symptoms such as mood disturbance and rumination.[3][10][17][18] These can be initiated while awaiting further interventions and may improve headache-related disability and coping.  Safety and monitoring: Occipital nerve blocks are generally safe, with minor adverse events reported; serious complications are rare. They can be combined with other pharmacologic and non-pharmacologic therapies with minimal risk of interaction.[8][9][15][16]  Further evidence needed: Long-term efficacy and optimal combinations of these therapies remain uncertain, and ongoing reassessment is important to tailor management to patient response.[4][13][5]  In summary, multimodal management--including alternative preventive medications, manual therapy, combined physical therapy approaches, behavioral interventions, and safe use of occipital nerve blocks--should be pursued while awaiting further interventions.   Would you like me to review the latest evidence comparing the efficacy of alternative preventive medications--such as gabapentin , SNRIs, or onabotulinumtoxinA--in cervicogenic and post-traumatic headache, to help prioritize which pharmacologic option to trial next? References Treatment Options for Posttraumatic Headache: A Current Review of the Literature. Minen MT, Romelle LOISE Fernand JULIANNA, et al. Current Pain and Headache Reports. 2024;28(4):205-210. doi:10.1007/s11916-023-01199-y. Diagnosis and Management of Headache: A Review. Robbins MS. JAMA. 2021;325(18):1874-1885. doi:10.1001/jama.7978.8359. Chronic Daily Headache: Diagnosis and Management. Antonetta RADDLE, [place], Koren KG. American Family Physician. 2014;89(8):642-8. Management of Headache (2023). Slater Saucier PhD DNP MSN/Ed PMHCNS PMHNP-BC RN, Mylinda HERO. Antonovich PharmD BCPS, Prentice BROCKS. Buelt DO, et al.  Department of [facility]. Efficacy of Physiotherapy Interventions for the Management of Adults With Cervicogenic Headache: A Systematic Review and Meta-Analyses. Demont A, Lafrance S, Gaska C, et al. PM & R : The Journal of Injury, Function, and Rehabilitation. 2023;15(5):613-628. doi:10.1002/pmrj.87143. 11. Cervicogenic Headache and Occipital Neuralgia. Lefel N, van Suijlekom H, Cohen SPC, Kallewaard JW, Fleeta Laruth ALF Pain Practice : The Official Journal of [employer] of Pain. 2025;25(1):e13405. doi:10.1111/papr.13405. Occipital Neuralgia and Cervicogenic Headache: Diagnosis and Management. Laroy JONELLE Agustin MICAEL Current Neurology and Neuroscience Reports. 2019;19(5):20. doi:10.1007/s11910-9102143020-8. Efficacy and Safety of Greater Occipital Nerve Block for the Treatment of Cervicogenic Headache: A Systematic Review. Caponnetto LULLA Chaya JONELLE Lockie I, et al. Expert Review of Neurotherapeutics. 2021;21(5):591-597. doi:10.1080/14737175.2021.1903320. Narrative Review of Peripheral Nerve Blocks for the Management of Headache. Unice ESPY, [place] CC, Kissoon NR, [place] CE. Headache. 2022;62(9):1077-1092. doi:10.1111/head.U3130837. Non-Pharmacological Approaches to Headaches: Non-Invasive Neuromodulation, Nutraceuticals, and Behavioral Approaches. Grazzi L, Toppo C, D'Amico  D, et al. International Journal of Pharmacologist. 2021;18(4):1503. doi:10.3390/ijerph18041503. Physical Therapist Interventions to Reduce Headache Intensity, Frequency, and Duration in Patients With Cervicogenic Headache: A Systematic Review and Network Meta-Analysis. Kara DELENA Charlynn ELINORE Colby TM, et al. Physical Therapy. 2024;104(2):pzad154. doi:10.1093/ptj/pzad154. Spinal Manipulation and Perineural Electrical Dry Needling in Patients With Cervicogenic Headache: A Multicenter Randomized Clinical Trial. Dunning J, Butts R, Zacharko LOISE, et al. The Spine Journal : Official Journal of the [employer]. 2021;21(2):284-295. doi:10.1016/j.spinee.2020.10.008. Non-Invasive Physical Treatments for Chronic/Recurrent Headache. Bronfort G, Nilsson N, Haas M, et al. The Cochrane Database of Systematic Reviews. 2004;(3):CD001878. doi:10.1002/14651858.RI998121.ela7. Comparative Effectiveness of Cervical vs Thoracic Spinal-Thrust Manipulation for Care of Cervicogenic Headache: A Randomized Controlled Trial. Lark MATSU, Alghadier M, Eltayeb MM, et al. PloS One. 2024;19(3):e0300737. doi:10.1371/journal.pone.9699262. Are Non-Invasive Interventions Effective for the Management of Headaches Associated With Neck Pain? An Update of the Bone and Joint Decade Task Force on Neck Pain and Its Associated Disorders by the [place] Protocol for Traffic Injury Management (OPTIMa) Collaboration. Varatharajan S, Ferguson B, Chrobak K, et al. European Spine Journal : Official Publication of the European Spine Society, the European Spinal Deformity Society, and the European Section of the Cervical Spine Research Society. 2016;25(7):1971-99. doi:10.1007/s00586-562 041 6541-9. Cervicogenic Headache. Jull G. Musculoskeletal Science & Practice. 7976;33:897212. doi:10.1016/j.msksp.2023.102787. An Updated Brief Overview on Post-Traumatic Headache and a Systematic Review of the Non-Pharmacological Interventions for Its Management. Argyriou AA, Mitsikostas DD, Mantovani E, et al. Expert Review of Neurotherapeutics. 2021;21(4):475-490. doi:10.1080/14737175.2021.1900734. Cognitive Behavioral Therapy for Veterans With Comorbid Posttraumatic Headache and Posttraumatic Stress Disorder Symptoms: A Randomized Clinical Trial. Leopold DD, Resick PA, Penzien DB, et al. JAMA Neurology. 2022;79(8):746-757. doi:10.1001/jamaneurol.2022.1567.    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  We reviewed the individualized data for him from  [employer] as follows:  For this patient with chronic daily post-traumatic headache, most consistent with cervicogenic headache, who has not responded to nortriptyline, dry needling, or a Medrol dose pack, and is awaiting vision therapy and a planned occipital nerve block, several additional evidence-based management strategies can be considered while awaiting further interventions.  Pharmacologic options: Alternative preventive medications include amitriptyline  (if not previously tried), gabapentin , propranolol, topiramate, valproate, and SNRIs such as venlafaxine. OnabotulinumtoxinA has shown benefit in small studies for post-traumatic and cervicogenic headache, though the evidence is limited and quality is low.[1][2][3][4][5] CGRP monoclonal antibodies (e.g., erenumab ) have some emerging evidence in post-traumatic headache, but their role in cervicogenic headache is not well established.[1]  Interventional and neuromodulation therapies: Occipital nerve blocks are supported for short-term relief and diagnostic clarification; pulsed radiofrequency and occipital nerve stimulation may be considered for refractory cases.[6][7][8][9] Non-invasive neuromodulation techniques, such as transcranial magnetic stimulation and vagal nerve stimulation, are promising but require further validation.[4][10][5]  Physical therapy and manual interventions: Spinal manipulation, manual therapy, and neck-specific exercises are effective, especially when combined with dry needling or exercise. Multimodal physical therapy approaches--including manipulation, craniocervical exercises, and postural correction--are recommended for cervicogenic headache and headaches associated with neck pain.[11][12][13][5][14][15][16] If dry needling alone was not effective, combining it with manipulation or exercise may yield better results.  Behavioral and non-pharmacologic strategies: Cognitive behavioral therapy, relaxation training,  biofeedback, and mindfulness-based therapies are beneficial for headache management and comorbid symptoms such as mood disturbance and rumination.[3][10][17][18] These can be initiated while awaiting further interventions and may improve headache-related disability and coping.  Safety and monitoring: Occipital nerve blocks are generally safe, with minor adverse events reported; serious  complications are rare. They can be combined with other pharmacologic and non-pharmacologic therapies with minimal risk of interaction.[8][9][15][16]  Further evidence needed: Long-term efficacy and optimal combinations of these therapies remain uncertain, and ongoing reassessment is important to tailor management to patient response.[4][13][5]  In summary, multimodal management--including alternative preventive medications, manual therapy, combined physical therapy approaches, behavioral interventions, and safe use of occipital nerve blocks--should be pursued while awaiting further interventions.   Would you like me to review the latest evidence comparing the efficacy of alternative preventive medications--such as gabapentin , SNRIs, or onabotulinumtoxinA--in cervicogenic and post-traumatic headache, to help prioritize which pharmacologic option to trial next? References Treatment Options for Posttraumatic Headache: A Current Review of the Literature. Minen MT, Romelle LOISE Fernand JULIANNA, et al. Current Pain and Headache Reports. 2024;28(4):205-210. doi:10.1007/s11916-023-01199-y. Diagnosis and Management of Headache: A Review. Robbins MS. JAMA. 2021;325(18):1874-1885. doi:10.1001/jama.7978.8359. Chronic Daily Headache: Diagnosis and Management. Antonetta RADDLE, [place], Koren KG. American Family Physician. 2014;89(8):642-8. Management of Headache (2023). Slater Saucier PhD DNP MSN/Ed PMHCNS PMHNP-BC RN, Mylinda HERO. Antonovich PharmD BCPS, Prentice BROCKS. Buelt DO, et al. Department of [facility]. Efficacy of Physiotherapy  Interventions for the Management of Adults With Cervicogenic Headache: A Systematic Review and Meta-Analyses. Demont A, Lafrance S, Gaska C, et al. PM & R : The Journal of Injury, Function, and Rehabilitation. 2023;15(5):613-628. doi:10.1002/pmrj.87143. 11. Cervicogenic Headache and Occipital Neuralgia. Lefel N, van Suijlekom H, Cohen SPC, Kallewaard JW, Fleeta Laruth ALF Pain Practice : The Official Journal of [employer] of Pain. 2025;25(1):e13405. doi:10.1111/papr.13405. Occipital Neuralgia and Cervicogenic Headache: Diagnosis and Management. Laroy JONELLE Agustin MICAEL Current Neurology and Neuroscience Reports. 2019;19(5):20. doi:10.1007/s11910-(518) 818-4696-8. Efficacy and Safety of Greater Occipital Nerve Block for the Treatment of Cervicogenic Headache: A Systematic Review. Caponnetto LULLA Chaya JONELLE Lockie I, et al. Expert Review of Neurotherapeutics. 2021;21(5):591-597. doi:10.1080/14737175.2021.1903320. Narrative Review of Peripheral Nerve Blocks for the Management of Headache. Unice ESPY, [place] CC, Kissoon NR, [place] CE. Headache. 2022;62(9):1077-1092. doi:10.1111/head.U3130837. Non-Pharmacological Approaches to Headaches: Non-Invasive Neuromodulation, Nutraceuticals, and Behavioral Approaches. Grazzi L, Toppo C, D'Amico D, et al. Public librarian. 2021;18(4):1503. doi:10.3390/ijerph18041503. Physical Therapist Interventions to Reduce Headache Intensity, Frequency, and Duration in Patients With Cervicogenic Headache: A Systematic Review and Network Meta-Analysis. Kara DELENA Charlynn ELINORE Colby TM, et al. Physical Therapy. 2024;104(2):pzad154. doi:10.1093/ptj/pzad154. Spinal Manipulation and Perineural Electrical Dry Needling in Patients With Cervicogenic Headache: A Multicenter Randomized Clinical Trial. Dunning J, Butts R, Zacharko LOISE, et al. The Spine Journal : Official Journal of the [employer]. 2021;21(2):284-295.  doi:10.1016/j.spinee.2020.10.008. Non-Invasive Physical Treatments for Chronic/Recurrent Headache. Bronfort G, Nilsson N, Haas M, et al. The Cochrane Database of Systematic Reviews. 2004;(3):CD001878. doi:10.1002/14651858.RI998121.ela7. Comparative Effectiveness of Cervical vs Thoracic Spinal-Thrust Manipulation for Care of Cervicogenic Headache: A Randomized Controlled Trial. Lark MATSU, Alghadier M, Eltayeb MM, et al. PloS One. 2024;19(3):e0300737. doi:10.1371/journal.pone.9699262. Are Non-Invasive Interventions Effective for the Management of Headaches Associated With Neck Pain? An Update of the Bone and Joint Decade Task Force on Neck Pain and Its Associated Disorders by the [place] Protocol for Traffic Injury Management (OPTIMa) Collaboration. Varatharajan S, Ferguson B, Chrobak K, et al. European Spine Journal : Official Publication of the European Spine Society, the European Spinal Deformity Society, and the European Section of the Cervical Spine Research Society. 2016;25(7):1971-99. doi:10.1007/s00586-(802)301-2106-9. Cervicogenic Headache. Jull G. Musculoskeletal Science & Practice. 7976;33:897212. doi:10.1016/j.msksp.2023.102787. An Updated Brief Overview on Post-Traumatic Headache and a Systematic Review of the Non-Pharmacological Interventions for Its Management. Argyriou AA, Mitsikostas DD, Mantovani E, et al. Expert Review of Neurotherapeutics.  2021;21(4):475-490. doi:10.1080/14737175.2021.1900734. Cognitive Behavioral Therapy for Veterans With Comorbid Posttraumatic Headache and Posttraumatic Stress Disorder Symptoms: A Randomized Clinical Trial. Leopold DD, Resick PA, Penzien DB, et al. JAMA Neurology. 2022;79(8):746-757. doi:10.1001/jamaneurol.2022.1567.

## 2024-08-11 ENCOUNTER — Encounter: Payer: Self-pay | Admitting: Internal Medicine

## 2024-08-11 ENCOUNTER — Telehealth: Payer: Self-pay

## 2024-08-11 DIAGNOSIS — Z7689 Persons encountering health services in other specified circumstances: Secondary | ICD-10-CM | POA: Diagnosis not present

## 2024-08-11 NOTE — Telephone Encounter (Signed)
 Placed on provider desk to be signed and filled out

## 2024-08-11 NOTE — Telephone Encounter (Signed)
 Copied from CRM [id number]. Topic: Referral - Status >> Aug 11, 2024  1:51 PM Aleatha C wrote: Reason for CRM: [facility] at [facility] isn't taking any outside referral

## 2024-08-12 DIAGNOSIS — Z7689 Persons encountering health services in other specified circumstances: Secondary | ICD-10-CM | POA: Diagnosis not present

## 2024-08-13 DIAGNOSIS — Z7689 Persons encountering health services in other specified circumstances: Secondary | ICD-10-CM | POA: Diagnosis not present

## 2024-08-13 NOTE — Telephone Encounter (Signed)
 Need codes to place on paper placed back on your desk then will see what else needs to be done

## 2024-08-18 ENCOUNTER — Encounter: Admitting: Internal Medicine

## 2024-08-18 ENCOUNTER — Ambulatory Visit: Admitting: Internal Medicine

## 2024-08-18 DIAGNOSIS — Z7689 Persons encountering health services in other specified circumstances: Secondary | ICD-10-CM | POA: Diagnosis not present

## 2024-08-22 ENCOUNTER — Telehealth: Payer: Self-pay

## 2024-08-22 DIAGNOSIS — Z7689 Persons encountering health services in other specified circumstances: Secondary | ICD-10-CM | POA: Diagnosis not present

## 2024-08-22 NOTE — Telephone Encounter (Signed)
 Please read below message and advise   Copied from CRM #[id number]. Topic: General - Transportation >> Aug 22, 2024 12:29 PM Shereese L wrote: Reason for CRM: Patient stated that his transportation form was brought with him and signed already. Per patient insurance stated that the doctor declined the transportation and needs an understanding as to why.

## 2024-08-22 NOTE — Telephone Encounter (Signed)
 Please review and advise.

## 2024-08-22 NOTE — Telephone Encounter (Signed)
 Received incoming fax from [facility] explaining reasoning for denial of transportation services. Paper has been placed in PCP folder for review.

## 2024-08-25 ENCOUNTER — Encounter: Payer: Self-pay | Admitting: Radiology

## 2024-08-25 ENCOUNTER — Telehealth: Payer: Self-pay

## 2024-08-25 NOTE — Telephone Encounter (Signed)
 Copied from CRM #[id number]. Topic: General - Call Back - No Documentation >> Aug 22, 2024  3:40 PM Alfonso ORN wrote: Reason for CRM: pt calling requesting to speak to nurse for f/u on fax regarding transportation denial. Contacted [facility] who stated Jesus team gone for the day. Please update pt on fax.  [phone] (M) Spoke with pt earlier I have another note on this

## 2024-08-25 NOTE — Telephone Encounter (Signed)
 Called pt spoke with him we need to reprint form out [facility] and get provider to fill it out again so we can resend. Pt is coming to appt no matter work 08/28/2024 he stated.

## 2024-08-28 ENCOUNTER — Encounter: Payer: Self-pay | Admitting: Internal Medicine

## 2024-08-28 ENCOUNTER — Ambulatory Visit (INDEPENDENT_AMBULATORY_CARE_PROVIDER_SITE_OTHER): Admitting: Internal Medicine

## 2024-08-28 VITALS — BP 120/72 | HR 83 | Temp 98.0°F | Ht 67.0 in | Wt 136.8 lb

## 2024-08-28 DIAGNOSIS — K224 Dyskinesia of esophagus: Secondary | ICD-10-CM | POA: Diagnosis not present

## 2024-08-28 DIAGNOSIS — Z7689 Persons encountering health services in other specified circumstances: Secondary | ICD-10-CM | POA: Diagnosis not present

## 2024-08-28 DIAGNOSIS — F0781 Postconcussional syndrome: Secondary | ICD-10-CM

## 2024-08-28 NOTE — Assessment & Plan Note (Signed)
 This condition contributes to dysphagia and malnutrition, affecting daily living and nutritional intake. Proceed with scheduled manometry with [facility]. Consider a nerve block as a potential treatment option.
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
## 2024-08-28 NOTE — Patient Instructions (Addendum)
 It was a pleasure seeing you today! Your health and satisfaction are our top priorities.  Bernardino Cone, MD  VISIT SUMMARY: Today, we discussed your ongoing health challenges related to post concussion syndrome and esophageal dysmotility. We also reviewed the implications of these conditions on your ability to work and the potential need for ADA accommodations and disability claims.  YOUR PLAN: -POSTCONCUSSION SYNDROME WITH CHRONIC POST-TRAUMATIC CERVICOGENIC HEADACHE AND OCULAR INVOLVEMENT: Postconcussion syndrome is a complex disorder with various symptoms like headaches and vision problems that persist after a head injury. You are advised to continue working to maintain job security and benefits, and we will proceed with the necessary documentation for ADA accommodations and disability claims.  -ESOPHAGEAL DYSMOTILITY: Esophageal dysmotility is a condition where the esophagus has difficulty moving food to the stomach, leading to swallowing problems and malnutrition. You should proceed with the scheduled manometry test with [facility] and consider a nerve block as a potential treatment option.  -MALNUTRITION: Malnutrition occurs when your body does not get enough nutrients, often due to difficulty swallowing. Improving your esophageal dysmotility should help enhance your nutritional intake.  INSTRUCTIONS: Please proceed with the scheduled manometry test with [facility]. Continue working to maintain job security and benefits while we complete the necessary documentation for ADA accommodations and disability claims.  Your Providers PCP: Cone Bernardino MATSU, MD,  [phone]) Referring Provider: Cone Bernardino MATSU, MD,  [phone]) Care Team Provider: Charlyne Helling, MD,  [phone]) Care Team Provider: Brien Garnette Pepper, MD,  [phone]) Care Team Provider: Jefrey Bruckner, MD,  [phone]) Care Team Provider: Leland Rankin Argyle, MD,  [phone]) Care Team Provider: Georgina Ozell LABOR,  MD,  [phone]) Care Team Provider: Darlean Ozell NOVAK, MD,  [phone]) Care Team Provider: Case, Jordan, MD,  [phone]) Care Team Provider: [name], MD,  [phone]) Care Team Provider: Delene Thersia PARAS Care Team Provider: [name], MD,  [phone]) Care Team Provider: [name], FNP,  [phone]) Care Team Provider: [name], MD,  [phone]) Care Team Provider: [name],  [phone]) Care Team Provider: [name], [place],  [phone]) Care Team Provider: [name], MD,  [phone]) Care Team Provider: [name], MD,  [phone])  NEXT STEPS: [x]  Early Intervention: Schedule sooner appointment, call our on-call services, or go to emergency room if there is any significant Increase in pain or discomfort New or worsening symptoms Sudden or severe changes in your health [x]  Flexible Follow-Up: We recommend a No follow-ups on file. for optimal routine care. This allows for progress monitoring and treatment adjustments. [x]  Preventive Care: Schedule your annual preventive care visit! It's typically covered by insurance and helps identify potential health issues early. [x]  Lab & X-ray Appointments: Incomplete tests scheduled today, or call to schedule. X-rays: [facility] (M-F, 8:30am-noon or 1pm-5pm). [x]  Medical Information Release: Sign a release form at front desk to obtain relevant medical information we don't have.  MAKING THE MOST OF OUR FOCUSED 20 MINUTE APPOINTMENTS: [x]   Clearly state your top concerns at the beginning of the visit to focus our discussion [x]   If you anticipate you will need more time, please inform the front desk during scheduling - we can book multiple appointments in the same week. [x]   If you have transportation problems- use our convenient video appointments or ask about transportation support. [x]   We can get down to  business faster if you use MyChart to update information before the visit and submit non-urgent questions before your visit. Thank  you for taking the time to provide details through MyChart.  Let our nurse know and she can import this information into your encounter documents.  Arrival and Wait Times: [x]   Arriving on time ensures that everyone receives prompt attention. [x]   Early morning (8a) and afternoon (1p) appointments tend to have shortest wait times. [x]   Unfortunately, we cannot delay appointments for late arrivals or hold slots during phone calls.  Getting Answers and Following Up [x]   Simple Questions & Concerns: For quick questions or basic follow-up after your visit, reach us  at [phone] or MyChart messaging. [x]   Complex Concerns: If your concern is more complex, scheduling an appointment might be best. Discuss this with the staff to find the most suitable option. [x]   Lab & Imaging Results: We'll contact you directly if results are abnormal or you don't use MyChart. Most normal results will be on MyChart within 2-3 business days, with a review message from Dr. Jesus. Haven't heard back in 2 weeks? Need results sooner? Contact us  at [phone]. [x]   Referrals: Our referral coordinator will manage specialist referrals. The specialist's office should contact you within 2 weeks to schedule an appointment. Call us  if you haven't heard from them after 2 weeks.  Staying Connected [x]   MyChart: Activate your MyChart for the fastest way to access results and message us . See the last page of this paperwork for instructions on how to activate.  Bring to Your Next Appointment [x]   Medications: Please bring all your medication bottles to your next appointment to ensure we have an accurate record of your prescriptions. [x]   Health Diaries: If you're monitoring any health conditions at home, keeping a diary of your readings can be very helpful for discussions at your next  appointment.  Billing [x]   X-ray & Lab Orders: These are billed by separate companies. Contact the invoicing company directly for questions or concerns. [x]   Visit Charges: Discuss any billing inquiries with our administrative services team.  Your Satisfaction Matters [x]   Share Your Experience: We strive for your satisfaction! If you have any complaints, or preferably compliments, please let Dr. Jesus know directly or contact our Practice Administrators, Manuelita Rubin or [employer], by asking at the front desk.   Reviewing Your Records [x]   Review this early draft of your clinical encounter notes below and the final encounter summary tomorrow on MyChart after its been completed.  All orders placed so far are visible here: Postconcussion syndrome  Esophageal dysmotility
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
## 2024-09-03 DIAGNOSIS — Z419 Encounter for procedure for purposes other than remedying health state, unspecified: Secondary | ICD-10-CM | POA: Diagnosis not present

## 2024-09-08 ENCOUNTER — Other Ambulatory Visit (HOSPITAL_COMMUNITY): Payer: Self-pay

## 2024-09-08 ENCOUNTER — Telehealth: Payer: Self-pay

## 2024-09-08 NOTE — Telephone Encounter (Signed)
 Pharmacy Patient Advocate Encounter   Received notification from Onbase that prior authorization for Nurtec 75MG  Tabs is required/requested.   Insurance verification completed.   The patient is insured through [employer].   Per test claim: PA required; PA submitted to above mentioned insurance via Latent Key/confirmation #/EOC [id number] Status is pending

## 2024-09-09 NOTE — Telephone Encounter (Signed)
 Pharmacy Patient Advocate Encounter  Received notification from [employer] that Prior Authorization for Nurtec tabs has been APPROVED from 09/08/24 to 09/08/25   PA #/Case ID/Reference #: [id number]

## 2024-09-11 ENCOUNTER — Other Ambulatory Visit (HOSPITAL_COMMUNITY): Payer: Self-pay

## 2024-09-11 DIAGNOSIS — Z7689 Persons encountering health services in other specified circumstances: Secondary | ICD-10-CM | POA: Diagnosis not present

## 2024-09-15 DIAGNOSIS — Z7689 Persons encountering health services in other specified circumstances: Secondary | ICD-10-CM | POA: Diagnosis not present

## 2024-09-17 DIAGNOSIS — Z7689 Persons encountering health services in other specified circumstances: Secondary | ICD-10-CM | POA: Diagnosis not present

## 2024-09-22 ENCOUNTER — Encounter: Admitting: Internal Medicine

## 2024-09-25 ENCOUNTER — Encounter: Payer: Self-pay | Admitting: Internal Medicine

## 2024-10-06 ENCOUNTER — Ambulatory Visit: Payer: Self-pay

## 2024-10-06 NOTE — Telephone Encounter (Signed)
 Initially received call from [place] at [facility], pt had disconnected.  Attempted to call back patient with no answer, voicemail left. Awaiting callback.    Copied from CRM #[id number]. Topic: Clinical - Red Word Triage >> Oct 06, 2024  3:20 PM Nurse Wanda NOVAK, RN wrote: Reason for CRM: Numbness, Cognitive Issues

## 2024-10-07 NOTE — Telephone Encounter (Signed)
 Tried to call pt this morning no answer left message to call office back.

## 2024-10-24 ENCOUNTER — Encounter: Payer: Self-pay | Admitting: Internal Medicine

## 2024-10-24 ENCOUNTER — Ambulatory Visit: Admitting: Internal Medicine

## 2024-10-27 ENCOUNTER — Encounter: Payer: Self-pay | Admitting: Internal Medicine

## 2024-10-27 NOTE — Telephone Encounter (Signed)
 Sent message to front desk to add if cancel

## 2024-10-29 ENCOUNTER — Ambulatory Visit: Admitting: Internal Medicine

## 2024-10-29 ENCOUNTER — Other Ambulatory Visit: Payer: Self-pay | Admitting: Internal Medicine

## 2024-10-29 DIAGNOSIS — G44329 Chronic post-traumatic headache, not intractable: Secondary | ICD-10-CM

## 2024-10-29 DIAGNOSIS — M542 Cervicalgia: Secondary | ICD-10-CM

## 2024-10-30 ENCOUNTER — Ambulatory Visit: Payer: Self-pay
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
## 2024-10-31 ENCOUNTER — Encounter: Payer: Self-pay | Admitting: Family Medicine

## 2024-10-31 ENCOUNTER — Ambulatory Visit: Admitting: Family Medicine

## 2024-10-31 VITALS — BP 139/87 | HR 85 | Temp 98.5°F | Ht 67.0 in | Wt 135.0 lb

## 2024-10-31 DIAGNOSIS — S39012D Strain of muscle, fascia and tendon of lower back, subsequent encounter: Secondary | ICD-10-CM

## 2024-10-31 DIAGNOSIS — S39012A Strain of muscle, fascia and tendon of lower back, initial encounter: Secondary | ICD-10-CM

## 2024-10-31 MED ORDER — DICLOFENAC SODIUM 75 MG PO TBEC: 75.0000 mg | DELAYED_RELEASE_TABLET | Freq: Two times a day (BID) | ORAL | 0 refills | Status: AC
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
## 2024-11-02 ENCOUNTER — Encounter: Payer: Self-pay | Admitting: Internal Medicine

## 2024-11-04 ENCOUNTER — Telehealth (INDEPENDENT_AMBULATORY_CARE_PROVIDER_SITE_OTHER): Admitting: Internal Medicine

## 2024-11-04 ENCOUNTER — Encounter: Payer: Self-pay | Admitting: Internal Medicine

## 2024-11-04 DIAGNOSIS — N5082 Scrotal pain: Secondary | ICD-10-CM | POA: Diagnosis not present

## 2024-11-04 DIAGNOSIS — N5319 Other ejaculatory dysfunction: Secondary | ICD-10-CM | POA: Diagnosis not present

## 2024-11-04 DIAGNOSIS — Z202 Contact with and (suspected) exposure to infections with a predominantly sexual mode of transmission: Secondary | ICD-10-CM | POA: Diagnosis not present

## 2024-11-04 NOTE — Patient Instructions (Addendum)
 It was a pleasure seeing you today! Your health and satisfaction are our top priorities.  Nicholas Cone, MD  VISIT SUMMARY: During your visit, we discussed your concerns about potential sexually transmitted infections (STIs) following unprotected intercourse. You reported experiencing urinary symptoms, pelvic sensitivity, and testicular sensitivity. We have ordered tests for various STIs and discussed the possibility of immediate antibiotic treatment if needed. We also addressed your concerns about scrotal pain and ejaculatory dysfunction.  YOUR PLAN: -EVALUATION FOR SEXUALLY TRANSMITTED INFECTION: You have reported urinary changes post-ejaculation, pelvic sensitivity, and testicular sensitivity. These symptoms may be related to an infection such as gonorrhea or chlamydia. We have ordered tests for gonorrhea, chlamydia, trichomonas, HIV, and syphilis. You can complete the testing at the [facility] or in our office, whichever is more convenient for you. If the results are pending, the [facility] clinic may provide immediate antibiotic treatment.  -SCROTAL PAIN WITH SUSPECTED EPIDIDYMITIS: You have mild tenderness in the epididymis area without significant swelling or marked tenderness. Epididymitis is an inflammation of the tube at the back of the testicle that carries sperm. There is no immediate need for an ultrasound unless you develop alarm symptoms such as marked tenderness or significant swelling. Please monitor your symptoms and consider an ultrasound if they worsen.  -EJACULATORY DYSFUNCTION: You have reported difficulty maintaining erections and less frequent erections, which may be related to an underlying infection. Ejaculatory dysfunction refers to problems with ejaculation, including difficulty maintaining erections. This issue is expected to resolve with appropriate treatment based on your STI test results.  INSTRUCTIONS: Please complete the STI testing at the minute clinic or in our office,  based on your convenience. Monitor for any alarm symptoms such as marked tenderness or significant swelling in the scrotal area and consider an ultrasound if symptoms worsen. Follow up with us  once you have your test results or if you experience any new or worsening symptoms.  Your Providers PCP: Nicholas Nicholas MATSU, MD,  [phone]) Referring Provider: [name], MD,  [id number]) Care Team Provider: Charlyne Helling, MD,  [phone]) Care Team Provider: Brien Garnette Pepper, MD,  [phone]) Care Team Provider: Jefrey Bruckner, MD,  [phone]) Care Team Provider: Leland Rankin Argyle, MD,  [phone]) Care Team Provider: Georgina Ozell LABOR, MD,  [phone]) Care Team Provider: Darlean Ozell NOVAK, MD,  [phone]) Care Team Provider: Case, Jordan, MD,  [phone]) Care Team Provider: [name], MD,  [phone]) Care Team Provider: [name], MD,  [phone]) Care Team Provider: [name], FNP,  [phone]) Care Team Provider: [name], MD,  [phone]) Care Team Provider: [name],  [phone]) Care Team Provider: [name], [place],  [phone]) Care Team Provider: [name], MD,  [phone]) Care Team Provider: [name], MD,  [phone])  NEXT STEPS: [x]  Early Intervention: Schedule sooner appointment, call our on-call services, or go to emergency room if there is any significant Increase in pain or discomfort New or worsening symptoms Sudden or severe changes in your health [x]  Flexible Follow-Up: We recommend a Return in about 1 week (around 11/11/2024). for optimal routine care. This allows for progress monitoring and treatment adjustments. [x]  Preventive Care: Schedule your annual preventive care visit! It's typically covered by insurance and helps identify potential health issues early. [x]  Lab & X-ray Appointments: Incomplete tests scheduled  today, or call to schedule. X-rays: [facility] (M-F, 8:30am-noon or 1pm-5pm). [x]  Medical Information Release: Sign a release form at front desk to obtain relevant medical information we  don't have.  MAKING THE MOST OF OUR FOCUSED 20 MINUTE APPOINTMENTS: [x]   Clearly state your top concerns at the beginning of the visit to focus our discussion [x]   If you anticipate you will need more time, please inform the front desk during scheduling - we can book multiple appointments in the same week. [x]   If you have transportation problems- use our convenient video appointments or ask about transportation support. [x]   We can get down to business faster if you use MyChart to update information before the visit and submit non-urgent questions before your visit. Thank you for taking the time to provide details through MyChart.  Let our nurse know and she can import this information into your encounter documents.  Arrival and Wait Times: [x]   Arriving on time ensures that everyone receives prompt attention. [x]   Early morning (8a) and afternoon (1p) appointments tend to have shortest wait times. [x]   Unfortunately, we cannot delay appointments for late arrivals or hold slots during phone calls.  Getting Answers and Following Up [x]   Simple Questions & Concerns: For quick questions or basic follow-up after your visit, reach us  at [phone] or MyChart messaging. [x]   Complex Concerns: If your concern is more complex, scheduling an appointment might be best. Discuss this with the staff to find the most suitable option. [x]   Lab & Imaging Results: We'll contact you directly if results are abnormal or you don't use MyChart. Most normal results will be on MyChart within 2-3 business days, with a review message from Dr. Jesus. Haven't heard back in 2 weeks? Need results sooner? Contact us  at [phone]. [x]   Referrals: Our referral coordinator will manage specialist referrals. The  specialist's office should contact you within 2 weeks to schedule an appointment. Call us  if you haven't heard from them after 2 weeks.  Staying Connected [x]   MyChart: Activate your MyChart for the fastest way to access results and message us . See the last page of this paperwork for instructions on how to activate.  Bring to Your Next Appointment [x]   Medications: Please bring all your medication bottles to your next appointment to ensure we have an accurate record of your prescriptions. [x]   Health Diaries: If you're monitoring any health conditions at home, keeping a diary of your readings can be very helpful for discussions at your next appointment.  Billing [x]   X-ray & Lab Orders: These are billed by separate companies. Contact the invoicing company directly for questions or concerns. [x]   Visit Charges: Discuss any billing inquiries with our administrative services team.  Your Satisfaction Matters [x]   Share Your Experience: We strive for your satisfaction! If you have any complaints, or preferably compliments, please let Dr. Jesus know directly or contact our Practice Administrators, Manuelita Rubin or [employer], by asking at the front desk.   Reviewing Your Records [x]   Review this early draft of your clinical encounter notes below and the final encounter summary tomorrow on MyChart after its been completed.  All orders placed so far are visible here: Exposure to sexually transmitted infection -     Urine cytology ancillary only; Future -     RPR W/RFLX TO RPR TITER, TREPONEMAL AB, SCREEN AND DIAGNOSIS; Future -     HIV Antibody (routine testing w rflx); Future -     HCV RNA quant rflx ultra or genotyp; Future  Disorder of ejaculation -     Urine cytology ancillary only; Future -     RPR W/RFLX TO RPR  TITER, TREPONEMAL AB, SCREEN AND DIAGNOSIS; Future -     HIV Antibody (routine testing w rflx); Future -     HCV RNA quant rflx ultra or genotyp; Future  Scrotal pain -      Urine cytology ancillary only; Future -     RPR W/RFLX TO RPR TITER, TREPONEMAL AB, SCREEN AND DIAGNOSIS; Future -     HIV Antibody (routine testing w rflx); Future -     HCV RNA quant rflx ultra or genotyp; Future

## 2024-11-04 NOTE — Progress Notes (Signed)
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 related to underlying infection. This is expected to resolve with treatment. Address underlying infection with appropriate treatment based on STI test results. Scrotal pain Scrotal pain with suspected epididymitis   He has mild tenderness in the epididymis area without significant swelling or marked tenderness. No immediate need for ultrasound unless alarm symptoms develop. Monitor for alarm symptoms such as marked tenderness or  significant swelling. Consider ultrasound if symptoms worsen or alarm symptoms develop.  ORDER ASSOCIATIONS  #   DIAGNOSIS / CONDITION ICD-10 ENCOUNTER ORDER     ICD-10-CM   1. Exposure to sexually transmitted infection  Z20.2 Urine cytology ancillary only    RPR    HIV antibody (with reflex)    HCV RNA quant rflx ultra or genotyp    2. 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 Scrotal pain  N50.82 Urine cytology ancillary only    RPR    HIV antibody (with reflex)    HCV RNA quant rflx ultra or genotyp         Orders Placed in Encounter:   Lab Orders         RPR         HIV antibody (with reflex)         HCV RNA quant rflx ultra or genotyp      This document was synthesized by artificial intelligence ([employer]) using HIPAA-compliant recording of the clinical interaction;   We discussed the use of AI scribe software for clinical note transcription with the patient, who gave verbal consent to proceed. additional Info: This encounter employed state-of-the-art, real-time, collaborative documentation. The patient actively reviewed and assisted in updating their electronic medical record on a shared screen, ensuring transparency and facilitating joint problem-solving for the problem list, overview, and plan. This approach promotes accurate, informed care. The treatment plan was discussed and reviewed in detail, including medication safety, potential side effects, and all patient questions. We confirmed understanding and comfort with the plan. Follow-up instructions were established, including contacting the office for any concerns, returning if symptoms worsen, persist, or new symptoms develop, and precautions for potential emergency department visits.

## 2024-11-05 ENCOUNTER — Ambulatory Visit: Admitting: Internal Medicine

## 2024-11-07 ENCOUNTER — Ambulatory Visit: Admitting: Internal Medicine

## 2024-11-13 ENCOUNTER — Encounter (HOSPITAL_COMMUNITY): Payer: Self-pay | Admitting: *Deleted

## 2024-11-13 ENCOUNTER — Emergency Department (HOSPITAL_COMMUNITY)

## 2024-11-13 ENCOUNTER — Emergency Department (HOSPITAL_COMMUNITY)
Admission: EM | Admit: 2024-11-13 | Discharge: 2024-11-13 | Disposition: A | Discharge status: Home / Self Care | Attending: Emergency Medicine | Admitting: Emergency Medicine

## 2024-11-13 ENCOUNTER — Other Ambulatory Visit: Payer: Self-pay

## 2024-11-13 ENCOUNTER — Ambulatory Visit: Payer: Self-pay

## 2024-11-13 DIAGNOSIS — M25511 Pain in right shoulder: Secondary | ICD-10-CM | POA: Insufficient documentation

## 2024-11-13 DIAGNOSIS — Z5321 Procedure and treatment not carried out due to patient leaving prior to being seen by health care provider: Secondary | ICD-10-CM | POA: Diagnosis not present
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
## 2024-11-13 NOTE — ED Provider Triage Note (Signed)
 Emergency Medicine Provider Triage Evaluation Note  Nicholas Johnson. , a 34 y.o. male  was evaluated in triage.  Pt complains right shoulder pain for past few days after working out/lifting.   Review of Systems  Positive: Right shoulder pain Negative: Neck pain. Weakness.   Physical Exam  BP 131/83 (BP Location: Left Arm)   Pulse 62   Temp 99 F (37.2 C) (Oral)   Resp 18   SpO2 100%  Gen:   Awake, no distress   Resp:  Normal effort  MSK:   Moves extremities without difficulty good passive rom right shoulder with mild pain. Tenderness right shoulder. No gross sts, no skin changes, erythema or increased warmth. C spine non tender.  Right radial pulse intact, 2+. RUE is of normal color and warmth without swelling.    Medical Decision Making  Medically screening exam initiated at 11:06 AM.  Appropriate orders placed.  Nicholas Johnson. was informed that the remainder of the evaluation will be completed by another provider, this initial triage assessment does not replace that evaluation, and the importance of remaining in the ED until their evaluation is complete.  Xrays ordered.    Bernard Drivers, MD 11/13/24 [phone]

## 2024-11-14 NOTE — Telephone Encounter (Signed)
 Please see call and msg from patient as FYI and advise

## 2024-11-18 ENCOUNTER — Ambulatory Visit: Admitting: Internal Medicine

## 2024-11-27 ENCOUNTER — Telehealth: Admitting: Internal Medicine

## 2024-11-27 ENCOUNTER — Encounter: Payer: Self-pay | Admitting: Internal Medicine

## 2024-11-27 VITALS — Ht 67.0 in | Wt 135.0 lb

## 2024-11-27 DIAGNOSIS — S069X9A Unspecified intracranial injury with loss of consciousness of unspecified duration, initial encounter: Secondary | ICD-10-CM | POA: Insufficient documentation

## 2024-11-27 DIAGNOSIS — S14109S Unspecified injury at unspecified level of cervical spinal cord, sequela: Secondary | ICD-10-CM

## 2024-11-27 DIAGNOSIS — R5381 Other malaise: Secondary | ICD-10-CM

## 2024-11-27 DIAGNOSIS — H5713 Ocular pain, bilateral: Secondary | ICD-10-CM

## 2024-11-27 DIAGNOSIS — K224 Dyskinesia of esophagus: Secondary | ICD-10-CM

## 2024-11-27 DIAGNOSIS — S069X9S Unspecified intracranial injury with loss of consciousness of unspecified duration, sequela: Secondary | ICD-10-CM

## 2024-11-27 DIAGNOSIS — F0781 Postconcussional syndrome: Secondary | ICD-10-CM
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
## 2024-11-27 NOTE — Assessment & Plan Note (Signed)
 Persistent eye pain and tracking difficulties, likely secondary to TBI. Structural eye disease ruled out by neuro-ophthalmology; neuro-optometry identified tracking issues but ongoing care limited by cost. Plan: Assist patient in identifying in-network neuro-optometry or neuro-ophthalmology providers covered by [facility] or Medicaid. Continue to coordinate with referring specialists for further recommendations. Reassess ocular symptoms after any new interventions or referrals.

## 2024-11-27 NOTE — Assessment & Plan Note (Signed)
 Chronic neck pain, likely neuropathic in nature, exacerbated by movement/position, associated with occipital headaches and neuralgia. Muscular etiology less likely at this stage. Plan: Proceed with scheduled occipital nerve block; assess response and consider further interventional pain options (e.g., ablation) as indicated. Continue follow-up with pain management and neurology. Monitor for any new focal neurological deficits.

## 2024-11-27 NOTE — Assessment & Plan Note (Signed)
 Complex oropharyngeal and esophageal dysphagia resulting in significant dietary restrictions and malnutrition, contributing to overall disability and financial hardship. Plan: Continue current GI management and dietary strategies. Monitor nutritional status; consider referral to nutritionist if not already involved. Document impact on functional status for disability determination.

## 2024-11-27 NOTE — Assessment & Plan Note (Signed)
 The patient continues to experience severe, disabling symptoms including cognitive impairment (memory, focus, brain fog), chronic headaches, and functional decline, refractory to extensive multidisciplinary evaluation and management. Plan: Coordinate with neurology, pain management, and neuropsychology to further assess current and future disability status. Advised return to [facility] neuropsychology for comprehensive neurocognitive assessment to support disability application. Encourage ongoing follow-up with neurology (Dr. Avi, multispecialty team) to discuss current symptoms and explore any further diagnostic or treatment options. Document lack of significant improvement despite >100 Primary Care Provider (PCP) and specialist visits to support disability determination. Discuss with treating specialists their willingness to provide supporting documentation for disability. Encourage patient to seek legal counsel for disability reapplication, given progression and persistence of symptoms.

## 2024-11-27 NOTE — Assessment & Plan Note (Signed)
 The patients constellation of symptoms (cognitive, neurological, ocular, and GI) have resulted in significant and persistent functional impairment, affecting employability and quality of life. Plan: Strongly support application for total permanent disability based on persistent symptoms and lack of improvement with maximal medical therapy. Continue multidisciplinary coordination to provide necessary documentation. Encourage patient to pursue legal representation for disability claim.

## 2024-11-27 NOTE — Patient Instructions (Signed)
 It was a pleasure seeing you today! Your health and satisfaction are our top priorities.  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 We also addressed your financial difficulties and the possibility of pursuing disability due to the disabling nature of your symptoms.  YOUR PLAN: -POSTCONCUSSION SYNDROME AND TRAUMATIC BRAIN INJURY, SEQUELA: This condition involves persistent symptoms following a traumatic brain injury, such as cognitive difficulties, memory issues, and brain fog. We will coordinate with specialists to support your disability application and schedule an appointment with [facility] to assess your disability level.  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 We are exploring insurance options for in-network neuro-optometry services and will follow up with specialists to discuss potential treatment options.  -CHRONIC NECK PAIN AND OCCIPITAL NEURALGIA: This condition involves chronic neck pain and nerve irritation, often worsened by certain movements. You will proceed with a scheduled occipital nerve block and consult with a pain clinic. Follow-up with a neurologist will discuss symptoms and potential treatment options.  INSTRUCTIONS: Please follow up with [facility] for your disability assessment and continue consultations with your specialists. Proceed with the scheduled occipital nerve block and consult with the pain clinic as planned. We will explore insurance options for in-network neuro-optometry services and follow up with specialists regarding your eye pain and visual tracking dysfunction.  Your Providers PCP: Cone Bernardino MATSU, MD,  [phone]) Referring  Provider: Cone Bernardino MATSU, MD,  [phone]) Care Team Provider: Charlyne Helling, MD,  [phone]) Care Team Provider: Brien Garnette Pepper, MD,  [phone]) Care Team Provider: Jefrey Bruckner, MD,  [phone]) Care Team Provider: Leland Rankin Argyle, MD,  [phone]) Care Team Provider: Georgina Ozell LABOR, MD,  [phone]) Care Team Provider: Darlean Ozell NOVAK, MD,  [phone]) Care Team Provider: Case, Jordan, MD,  [phone]) Care Team Provider: [name], MD,  [phone]) Care Team Provider: [name], MD,  [phone]) Care Team Provider: [name], FNP,  [phone]) Care Team Provider: [name], MD,  [phone]) Care Team Provider: [name],  [phone]) Care Team Provider: [name], [place],  [phone]) Care Team Provider: [name], MD,  [phone]) Care Team Provider: [name], MD,  [phone])  NEXT STEPS: [x]  Early Intervention: Schedule sooner appointment, call our on-call services, or go to emergency room if there is any significant Increase in pain or discomfort New or worsening symptoms Sudden or severe changes in your health [x]  Flexible Follow-Up: We recommend a No follow-ups on file. for optimal routine care. This allows for progress monitoring and treatment adjustments. [x]  Preventive Care: Schedule your annual preventive care visit! It's typically covered by insurance and helps identify potential health issues early. [x]  Lab & X-ray Appointments: Incomplete tests scheduled today, or call to schedule. X-rays: [facility] (M-F, 8:30am-noon or 1pm-5pm). [x]  Medical Information Release: Sign a release form at front desk to obtain relevant medical information we don't have.  MAKING THE MOST OF OUR FOCUSED 20 MINUTE APPOINTMENTS: [x]   Clearly state your top concerns at the beginning of the visit to focus our discussion [x]    If you anticipate you will need more time, please inform the front desk during scheduling - we can book multiple appointments in the same week. [x]   If you have  transportation problems- use our convenient video appointments or ask about transportation support. [x]   We can get down to business faster if you use MyChart to update information before the visit and submit non-urgent questions before your visit. Thank you for taking the time to provide details through MyChart.  Let our nurse know and she can import this information into your encounter documents.  Arrival and Wait Times: [x]   Arriving on time ensures that everyone receives prompt attention. [x]   Early morning (8a) and afternoon (1p) appointments tend to have shortest wait times. [x]   Unfortunately, we cannot delay appointments for late arrivals or hold slots during phone calls.  Getting Answers and Following Up [x]   Simple Questions & Concerns: For quick questions or basic follow-up after your visit, reach us  at [phone] or MyChart messaging. [x]   Complex Concerns: If your concern is more complex, scheduling an appointment might be best. Discuss this with the staff to find the most suitable option. [x]   Lab & Imaging Results: We'll contact you directly if results are abnormal or you don't use MyChart. Most normal results will be on MyChart within 2-3 business days, with a review message from Dr. Jesus. Haven't heard back in 2 weeks? Need results sooner? Contact us  at [phone]. [x]   Referrals: Our referral coordinator will manage specialist referrals. The specialist's office should contact you within 2 weeks to schedule an appointment. Call us  if you haven't heard from them after 2 weeks.  Staying Connected [x]   MyChart: Activate your MyChart for the fastest way to access results and message us . See the last page of this paperwork for instructions on how to activate.  Bring to Your Next Appointment [x]   Medications: Please  bring all your medication bottles to your next appointment to ensure we have an accurate record of your prescriptions. [x]   Health Diaries: If you're monitoring any health conditions at home, keeping a diary of your readings can be very helpful for discussions at your next appointment.  Billing [x]   X-ray & Lab Orders: These are billed by separate companies. Contact the invoicing company directly for questions or concerns. [x]   Visit Charges: Discuss any billing inquiries with our administrative services team.  Your Satisfaction Matters [x]   Share Your Experience: We strive for your satisfaction! If you have any complaints, or preferably compliments, please let Dr. Jesus know directly or contact our Practice Administrators, Manuelita Rubin or [employer], by asking at the front desk.   Reviewing Your Records [x]   Review this early draft of your clinical encounter notes below and the final encounter summary tomorrow on MyChart after its been completed.  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

## 2024-12-12 ENCOUNTER — Ambulatory Visit
